# Patient Record
Sex: Female | Born: 1975 | Race: Black or African American | Hispanic: No | Marital: Single | Smoking: Never smoker
Health system: Southern US, Community
[De-identification: ages and names within clinical notes are randomized; demographics above are authoritative.]

## PROBLEM LIST (undated history)

## (undated) DIAGNOSIS — E669 Obesity, unspecified: Secondary | ICD-10-CM

## (undated) DIAGNOSIS — L980 Pyogenic granuloma: Secondary | ICD-10-CM

## (undated) DIAGNOSIS — G43909 Migraine, unspecified, not intractable, without status migrainosus: Secondary | ICD-10-CM

## (undated) DIAGNOSIS — E119 Type 2 diabetes mellitus without complications: Secondary | ICD-10-CM

## (undated) DIAGNOSIS — J45909 Unspecified asthma, uncomplicated: Secondary | ICD-10-CM

## (undated) DIAGNOSIS — J302 Other seasonal allergic rhinitis: Secondary | ICD-10-CM

## (undated) DIAGNOSIS — Z862 Personal history of diseases of the blood and blood-forming organs and certain disorders involving the immune mechanism: Secondary | ICD-10-CM

## (undated) DIAGNOSIS — I10 Essential (primary) hypertension: Secondary | ICD-10-CM

## (undated) HISTORY — PX: DILATION AND CURETTAGE OF UTERUS: SHX78

## (undated) HISTORY — DX: Essential (primary) hypertension: I10

## (undated) HISTORY — PX: CHOLECYSTECTOMY: SHX55

## (undated) HISTORY — DX: Type 2 diabetes mellitus without complications: E11.9

## (undated) HISTORY — PX: DILATION AND EVACUATION: SHX1459

## (undated) HISTORY — PX: CERVICAL CONE BIOPSY: SUR198

## (undated) HISTORY — DX: Migraine, unspecified, not intractable, without status migrainosus: G43.909

---

## 2001-08-21 ENCOUNTER — Emergency Department (HOSPITAL_COMMUNITY): Admission: EM | Admit: 2001-08-21 | Discharge: 2001-08-21 | Payer: Self-pay | Admitting: Emergency Medicine

## 2001-09-09 ENCOUNTER — Encounter: Payer: Self-pay | Admitting: Emergency Medicine

## 2001-09-09 ENCOUNTER — Emergency Department (HOSPITAL_COMMUNITY): Admission: EM | Admit: 2001-09-09 | Discharge: 2001-09-09 | Payer: Self-pay | Admitting: Emergency Medicine

## 2002-09-01 ENCOUNTER — Emergency Department (HOSPITAL_COMMUNITY): Admission: EM | Admit: 2002-09-01 | Discharge: 2002-09-01 | Payer: Self-pay | Admitting: Emergency Medicine

## 2002-09-04 ENCOUNTER — Emergency Department (HOSPITAL_COMMUNITY): Admission: EM | Admit: 2002-09-04 | Discharge: 2002-09-04 | Payer: Self-pay | Admitting: *Deleted

## 2004-12-24 ENCOUNTER — Emergency Department (HOSPITAL_COMMUNITY): Admission: EM | Admit: 2004-12-24 | Discharge: 2004-12-24 | Payer: Self-pay | Admitting: Emergency Medicine

## 2005-07-05 ENCOUNTER — Emergency Department (HOSPITAL_COMMUNITY): Admission: EM | Admit: 2005-07-05 | Discharge: 2005-07-05 | Payer: Self-pay | Admitting: Emergency Medicine

## 2005-07-16 ENCOUNTER — Emergency Department (HOSPITAL_COMMUNITY): Admission: EM | Admit: 2005-07-16 | Discharge: 2005-07-16 | Payer: Self-pay | Admitting: Emergency Medicine

## 2005-12-02 ENCOUNTER — Emergency Department (HOSPITAL_COMMUNITY): Admission: EM | Admit: 2005-12-02 | Discharge: 2005-12-02 | Payer: Self-pay | Admitting: *Deleted

## 2006-01-05 ENCOUNTER — Emergency Department (HOSPITAL_COMMUNITY): Admission: EM | Admit: 2006-01-05 | Discharge: 2006-01-05 | Payer: Self-pay | Admitting: Emergency Medicine

## 2007-04-16 ENCOUNTER — Emergency Department (HOSPITAL_COMMUNITY): Admission: EM | Admit: 2007-04-16 | Discharge: 2007-04-16 | Payer: Self-pay | Admitting: Emergency Medicine

## 2007-07-26 ENCOUNTER — Inpatient Hospital Stay (HOSPITAL_COMMUNITY): Admission: AD | Admit: 2007-07-26 | Discharge: 2007-07-26 | Payer: Self-pay | Admitting: Obstetrics and Gynecology

## 2009-02-22 ENCOUNTER — Emergency Department (HOSPITAL_BASED_OUTPATIENT_CLINIC_OR_DEPARTMENT_OTHER): Admission: EM | Admit: 2009-02-22 | Discharge: 2009-02-23 | Payer: Self-pay | Admitting: Emergency Medicine

## 2009-02-22 ENCOUNTER — Emergency Department (HOSPITAL_BASED_OUTPATIENT_CLINIC_OR_DEPARTMENT_OTHER): Admission: EM | Admit: 2009-02-22 | Discharge: 2009-02-22 | Payer: Self-pay | Admitting: Emergency Medicine

## 2009-02-22 ENCOUNTER — Ambulatory Visit: Payer: Self-pay | Admitting: Diagnostic Radiology

## 2009-04-30 ENCOUNTER — Emergency Department (HOSPITAL_BASED_OUTPATIENT_CLINIC_OR_DEPARTMENT_OTHER): Admission: EM | Admit: 2009-04-30 | Discharge: 2009-04-30 | Payer: Self-pay | Admitting: Emergency Medicine

## 2009-05-20 ENCOUNTER — Emergency Department (HOSPITAL_BASED_OUTPATIENT_CLINIC_OR_DEPARTMENT_OTHER): Admission: EM | Admit: 2009-05-20 | Discharge: 2009-05-20 | Payer: Self-pay | Admitting: Emergency Medicine

## 2009-05-20 ENCOUNTER — Ambulatory Visit: Payer: Self-pay | Admitting: Diagnostic Radiology

## 2010-05-24 ENCOUNTER — Emergency Department (HOSPITAL_COMMUNITY)
Admission: EM | Admit: 2010-05-24 | Discharge: 2010-05-24 | Payer: Self-pay | Source: Home / Self Care | Admitting: Emergency Medicine

## 2010-05-24 LAB — POCT I-STAT, CHEM 8
BUN: 7 mg/dL (ref 6–23)
Calcium, Ion: 1.04 mmol/L — ABNORMAL LOW (ref 1.12–1.32)
Chloride: 102 mEq/L (ref 96–112)
Creatinine, Ser: 0.8 mg/dL (ref 0.4–1.2)
Glucose, Bld: 108 mg/dL — ABNORMAL HIGH (ref 70–99)
HCT: 37 % (ref 36.0–46.0)
Hemoglobin: 12.6 g/dL (ref 12.0–15.0)
Potassium: 4.3 mEq/L (ref 3.5–5.1)
Sodium: 137 mEq/L (ref 135–145)
TCO2: 28 mmol/L (ref 0–100)

## 2010-05-24 LAB — BASIC METABOLIC PANEL
BUN: 6 mg/dL (ref 6–23)
CO2: 29 mEq/L (ref 19–32)
Calcium: 9.2 mg/dL (ref 8.4–10.5)
Chloride: 101 mEq/L (ref 96–112)
Creatinine, Ser: 0.78 mg/dL (ref 0.4–1.2)
GFR calc Af Amer: >60 mL/min (ref >60)
GFR calc non Af Amer: >60 mL/min (ref >60)
Glucose, Bld: 110 mg/dL — ABNORMAL HIGH (ref 70–99)
Potassium: 3.9 mEq/L (ref 3.5–5.1)
Sodium: 134 mEq/L — ABNORMAL LOW (ref 135–145)

## 2010-07-20 ENCOUNTER — Emergency Department (HOSPITAL_COMMUNITY)
Admission: EM | Admit: 2010-07-20 | Discharge: 2010-07-20 | Disposition: A | Discharge status: Home / Self Care | Payer: Self-pay | Attending: Emergency Medicine | Admitting: Emergency Medicine

## 2010-07-20 DIAGNOSIS — I10 Essential (primary) hypertension: Secondary | ICD-10-CM | POA: Insufficient documentation

## 2010-07-20 DIAGNOSIS — J309 Allergic rhinitis, unspecified: Secondary | ICD-10-CM | POA: Insufficient documentation

## 2010-07-20 DIAGNOSIS — J45909 Unspecified asthma, uncomplicated: Secondary | ICD-10-CM | POA: Insufficient documentation

## 2010-07-20 DIAGNOSIS — R0602 Shortness of breath: Secondary | ICD-10-CM | POA: Insufficient documentation

## 2010-07-27 LAB — RAPID STREP SCREEN (MED CTR MEBANE ONLY): Streptococcus, Group A Screen (Direct): NEGATIVE

## 2010-09-03 ENCOUNTER — Emergency Department (HOSPITAL_COMMUNITY)
Admission: EM | Admit: 2010-09-03 | Discharge: 2010-09-03 | Disposition: A | Discharge status: Home / Self Care | Payer: Self-pay | Attending: Emergency Medicine | Admitting: Emergency Medicine

## 2010-09-03 DIAGNOSIS — I10 Essential (primary) hypertension: Secondary | ICD-10-CM | POA: Insufficient documentation

## 2010-09-03 DIAGNOSIS — J329 Chronic sinusitis, unspecified: Secondary | ICD-10-CM | POA: Insufficient documentation

## 2010-09-03 DIAGNOSIS — R51 Headache: Secondary | ICD-10-CM | POA: Insufficient documentation

## 2010-10-19 ENCOUNTER — Inpatient Hospital Stay (HOSPITAL_COMMUNITY): Payer: Self-pay

## 2010-10-19 ENCOUNTER — Inpatient Hospital Stay (HOSPITAL_COMMUNITY)
Admission: AD | Admit: 2010-10-19 | Discharge: 2010-10-19 | Disposition: A | Discharge status: Home / Self Care | Payer: Self-pay | Source: Ambulatory Visit | Attending: Family Medicine | Admitting: Family Medicine

## 2010-10-19 DIAGNOSIS — A499 Bacterial infection, unspecified: Secondary | ICD-10-CM

## 2010-10-19 DIAGNOSIS — N938 Other specified abnormal uterine and vaginal bleeding: Secondary | ICD-10-CM

## 2010-10-19 DIAGNOSIS — N760 Acute vaginitis: Secondary | ICD-10-CM

## 2010-10-19 DIAGNOSIS — N949 Unspecified condition associated with female genital organs and menstrual cycle: Secondary | ICD-10-CM

## 2010-10-19 DIAGNOSIS — B9689 Other specified bacterial agents as the cause of diseases classified elsewhere: Secondary | ICD-10-CM | POA: Insufficient documentation

## 2010-10-19 LAB — WET PREP, GENITAL
WBC, Wet Prep HPF POC: NONE SEEN
Yeast Wet Prep HPF POC: NONE SEEN

## 2010-10-19 LAB — CBC
MCV: 84.7 fL (ref 78.0–100.0)
Platelets: 295 10*3/uL (ref 150–400)
RBC: 4.51 MIL/uL (ref 3.87–5.11)
RDW: 15.2 % (ref 11.5–15.5)
WBC: 7.9 10*3/uL (ref 4.0–10.5)

## 2010-10-27 ENCOUNTER — Other Ambulatory Visit: Payer: Self-pay | Admitting: Obstetrics and Gynecology

## 2010-10-27 ENCOUNTER — Ambulatory Visit (INDEPENDENT_AMBULATORY_CARE_PROVIDER_SITE_OTHER): Payer: Self-pay | Admitting: Obstetrics and Gynecology

## 2010-10-27 DIAGNOSIS — N939 Abnormal uterine and vaginal bleeding, unspecified: Secondary | ICD-10-CM

## 2010-10-27 DIAGNOSIS — N926 Irregular menstruation, unspecified: Secondary | ICD-10-CM

## 2010-10-28 NOTE — Assessment & Plan Note (Signed)
Mary Hanson, MARCOTTE NO.:  [id number]  MEDICAL RECORD NO.:  [id number]           PATIENT TYPE:  A  LOCATION:  [facility] at [facility]         FACILITY:  [facility]  PHYSICIAN:  Caren Griffins, CNM       DATE OF BIRTH:  February 09, 1976  DATE OF SERVICE:  10/27/2010                                 CLINIC NOTE  REASON FOR VISIT:  Followup for abnormal vaginal bleeding.  HISTORY:  This is a 35 year old AAF who was seen in maternity admissions on October 19, 2010, due to skipping the month of May when her cycle is due and began bleeding on October 04, 2010, and has continued ever since on October 19, 2010.  She had a hemoglobin of 12.1, negative UPT, negative GC and Chlamydia, and she was given Provera which she says is 5 mg a day to take one a day which she has been doing.  She has noticed no difference in the bleeding, has no orthostatic symptoms or very heavy bleeding. She is using 3-4 pads a day.  She states that her menses were at monthly intervals her whole life except for a period when she was 19 and had a therapeutic AB and had some irregular bleeding following that otherwise she has had menses every month with 7-day flow, moderate amount, minimal dysmenorrhea.  Also of note, her ultrasound on October 19, 2010, showed normal exam with no lesions to explain bleeding.  Her endometrial stripe was 11 mm.  There was no evidence of PCOS.  She denies hirsutism.  Chronic hypertension  this was diagnosed a  year ago.  She gets care at the [facility]  She states that she is in the process of getting her Norvasc 3 pill and has been out for a couple of days.  She says that her pressures are usually 124/80 when she takes her Norvasc. She also says that she was evaluated for thyroid disorder about a year ago, and she had a TSH that was normal and a thyroid ultrasound that was normal.  She does tell me that her twin sister has hypothyroidism.  She is also taking Flagyl as directed due to  having few clue cells on her wet prep and she was seen in [facility]U on October 19, 2010.  She denies any irritative vaginal discharge.  ALLERGIES:  None.  CURRENT MEDICATIONS:  Norvasc 5 mg today but she is out of it. Occasionally uses __________ or albuterol.  HEALTH CARE MAINTENANCE:  She has had the usual immunizations and gets dental care.  MENSTRUAL HISTORY:  9 x 28 x 7, four day moderate flow.  No intermenstrual spotting or bleeding.  She uses condoms for contraception.  She thinks her last Pap smear was about 3 years ago and about 16 years ago she did have cryo procedure to her cervix.  STD history all negative.  No history of endometriosis, fibroids, or ovarian cyst.  PAST MEDICAL HISTORY:  PMH is significant for asthma but she only uses albuterol occasionally and high blood pressure which she has known about for 1 year.  PAST SURGICAL HISTORY:  Surgeries.  She had cholecystectomy in 2005.  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  Nonsmoker and no drink, no illicit drug use.  No history of abuse.  She is in a mutually monogamous relationship for over a year.  FAMILY HISTORY:  Father diabetes.  Paternal grandparents heart disease. Mother, father, and twin sister all have high blood pressure.  No history of cancer of breast, colon, ovaries, or uterus.  No history of blood clots.  REVIEW OF SYSTEMS:  Positive for weakness and headaches at times.  PHYSICAL EXAMINATION:  VITAL SIGNS:  Temperature 99.5, pulse 97, BP 180/95 on recheck 172/101.  Weight 304 pounds, height 5 feet 6 inches. GENERAL:  Very pleasant in NAD. HEENT:  Normocephalic. NECK:  Thyroid ULNS. HEART:  RRR without murmur. LUNGS:  CTA bilateral. ABDOMEN:  Morbidly obese and nontender. PELVIC:  NEFG.  No vaginal or cervical lesions noted, have a moderate amount of blood obscuring the cervix.  Pap smear was done, unable to appreciate any tenderness or masses on bimanual exam due  to her morbid obesity.  IMPRESSION AND PLAN: 1. Morbid obesity.  She is aware that this is a  important health     problem that she needs to address with diet and exercise.  I have     encouraged her to do so.  Chronic hypertension, poorly controlled     and off her meds for 2 days.  She will be getting her prescription     in the next day and she is advised to go back to the [facility] for another blood pressure check after she has been on the     medication for a couple of weeks. 2. Abnormal bleeding.  I discussed the need for endometrial biopsy or     further workup at this time with Dr. Okey Dupre he advises to just     increase the amount of Provera that she is on so will increase her     to 10 mg b.i.d. for 10 days.  After the bleeding stops, she is told     to keep a menstrual calendar, and if her cycles normalize he does     not suggest proceeding with an endometrial biopsy. 3. Her upper limits normal size thyroid and hypothyroidism in twin     sister.  TSH is done today.  She will come back with her menstrual     calendar in 4-6 months.          ______________________________ Caren Griffins, CNM    DP/MEDQ  D:  10/27/2010  T:  10/28/2010  Job:  [id number]

## 2010-11-17 ENCOUNTER — Ambulatory Visit (INDEPENDENT_AMBULATORY_CARE_PROVIDER_SITE_OTHER): Payer: Self-pay | Admitting: Obstetrics & Gynecology

## 2010-11-17 ENCOUNTER — Encounter: Payer: Self-pay | Admitting: Obstetrics & Gynecology

## 2010-11-17 VITALS — BP 151/86 | HR 78 | Temp 97.4°F | Ht 66.0 in | Wt 300.0 lb

## 2010-11-17 DIAGNOSIS — N938 Other specified abnormal uterine and vaginal bleeding: Secondary | ICD-10-CM

## 2010-11-17 DIAGNOSIS — N949 Unspecified condition associated with female genital organs and menstrual cycle: Secondary | ICD-10-CM

## 2010-11-17 LAB — POCT PREGNANCY, URINE: Preg Test, Ur: NEGATIVE

## 2010-11-17 MED ORDER — MEDROXYPROGESTERONE ACETATE 10 MG PO TABS: 10.0000 mg | ORAL_TABLET | Freq: Every day | ORAL | Status: DC

## 2010-11-17 MED ORDER — AMLODIPINE BESYLATE 10 MG PO TABS: 10.0000 mg | ORAL_TABLET | Freq: Every day | ORAL | Status: DC

## 2010-11-17 NOTE — Progress Notes (Signed)
  Subjective:     Mary Hanson is an 35 y.o. woman who presents for irregular menses. She had been bleeding regularly. She is now bleeding every  day and menses are lasting several days. She changes her pad or tampon every several hours. Dysmenorrhea:mild, occurring throughout menses. Cyclic symptoms include: none. Current contraception: condoms. History of infertility: no. History of abnormal Pap smear: no. Finished provera for 10 days, bleeding almost stopped, now resumed.  Menstrual History:  Patient's last menstrual period was 10/04/2010.    The following portions of the patient's history were reviewed and updated as appropriate: allergies, current medications, past family history, past medical history, past social history, past surgical history and problem list.  Review of Systems Pertinent items are noted in HPI.    Objective:    BP 151/86  Pulse 78  Temp(Src) 97.4 F (36.3 C) (Oral)  Ht 5\' 6"  (1.676 m)  Wt 300 lb (136.079 kg)  BMI 48.42 kg/m2  LMP 10/04/2010    Assessment:    dysfunctional uterine bleeding    Plan:     Provera 10mg  daily for 6 weeks. RTC 3 mo. Subjective:     Mary Hanson is a 35 y.o. woman who presents for irregular menses. Patient's last menstrual period was 10/04/2010. Me. Periods are regular every 28-30 days until recently , lasting a few days. Dysmenorrhea:none. Cyclic symptoms include: none. Current contraception: condoms.History of infertility: no. History of abnormal Pap smear: no. She had episode of prolonged vaginal bleeding and was seen in MAU 6/27, then f/u in clinic 7/5. She took Provera 10mg  bid for 10 days. She almost stopped bleeding, the restarted, 2 pad a day and some cramps relieved  by Motrin.  The following portions of the patient's history were reviewed and updated as appropriate: allergies, current medications, past family history, past medical history, past social history, past surgical history and problem list.  Review of  Systems Pertinent items are noted in HPI.     Objective:    Normal affect, alert. Exam deferred    Assessment:    The patient has menometrorrhagia.    Plan:    Diagnosis explained in detail. All questions answered. We will continue Provera for 6 weeks, return in 3 mo. Keep menstrual calendar.

## 2010-11-17 NOTE — Progress Notes (Signed)
upt is negative

## 2010-11-29 ENCOUNTER — Inpatient Hospital Stay (HOSPITAL_COMMUNITY)
Admission: AD | Admit: 2010-11-29 | Discharge: 2010-11-29 | Disposition: A | Discharge status: Home / Self Care | Payer: Self-pay | Source: Ambulatory Visit | Attending: Obstetrics & Gynecology | Admitting: Obstetrics & Gynecology

## 2010-11-29 ENCOUNTER — Telehealth: Payer: Self-pay | Admitting: *Deleted

## 2010-11-29 ENCOUNTER — Encounter (HOSPITAL_COMMUNITY): Payer: Self-pay

## 2010-11-29 DIAGNOSIS — N938 Other specified abnormal uterine and vaginal bleeding: Secondary | ICD-10-CM | POA: Insufficient documentation

## 2010-11-29 DIAGNOSIS — N949 Unspecified condition associated with female genital organs and menstrual cycle: Secondary | ICD-10-CM | POA: Insufficient documentation

## 2010-11-29 LAB — CBC
HCT: 38.1 % (ref 36.0–46.0)
Hemoglobin: 12.1 g/dL (ref 12.0–15.0)
MCHC: 31.8 g/dL (ref 30.0–36.0)
MCV: 84.9 fL (ref 78.0–100.0)
WBC: 10.8 10*3/uL — ABNORMAL HIGH (ref 4.0–10.5)

## 2010-11-29 NOTE — Progress Notes (Signed)
Patient states that she has had irregular vaginal bleeding since June 12th. She states that she has been seen at the clinics and had a negative ultrasound and July 17th she was to start provera 10mg  for 42 days but she didn't start to take it because the bleeding stopped. She states that she woke up this morning with bright red bleeding that has clots. She denies feeling dizzy.

## 2010-11-29 NOTE — ED Provider Notes (Addendum)
History   Pt presents today c/o vag bleeding. She has been seen multiple times for this same problem. She was given a new Rx for provera but told to wait and see if her bleeding stopped on its on. She states the bleeding did stop for about 1wk but began again this morning. She denies severe pain, fever, or any other problems at this time. The last time she took provera was about 2.5 wks ago.  Chief Complaint  Patient presents with  . Vaginal Bleeding   HPI  OB History    Grav Para Term Preterm Abortions TAB SAB Ect Mult Living   1    1 1           Past Medical History  Diagnosis Date  . Hypertension   . Asthma   . Migraines 2011    Past Surgical History  Procedure Date  . Cholecystectomy 2006 or 2007  . Dilation and curettage of uterus     ABORTION    Family History  Problem Relation Age of Onset  . Diabetes type II Father   . Hypertension Father   . Hypertension Mother   . Goiter Sister 19    identical twin  . Asthma Sister   . Hypertension Sister     History  Substance Use Topics  . Smoking status: Never Smoker   . Smokeless tobacco: Not on file  . Alcohol Use: 0.0 oz/week    0 Glasses of wine per week    Allergies: No Known Allergies  Prescriptions prior to admission  Medication Sig Dispense Refill  . amLODipine (NORVASC) 10 MG tablet Take 1 tablet (10 mg total) by mouth daily.  30 tablet  1  . ibuprofen (ADVIL,MOTRIN) 200 MG tablet Take 200 mg by mouth every 6 (six) hours as needed. For cramps.       . medroxyPROGESTERone (PROVERA) 10 MG tablet Take 1 tablet (10 mg total) by mouth daily.  42 tablet  1  . Multiple Vitamins-Minerals (MULTIVITAMIN WITH MINERALS) tablet Take 1 tablet by mouth daily.        Marland Kitchen albuterol (PROVENTIL, VENTOLIN) (5 MG/ML) 0.5% NEBU Take by nebulization every 4 (four) hours as needed.          Review of Systems  Constitutional: Negative for fever.  Cardiovascular: Negative for chest pain.  Gastrointestinal: Negative for nausea,  vomiting, abdominal pain, diarrhea and constipation.  Genitourinary: Negative for dysuria, urgency, frequency and hematuria.  Neurological: Negative for dizziness and headaches.  Psychiatric/Behavioral: Negative for depression and suicidal ideas.   Physical Exam   Blood pressure 139/90, pulse 71, temperature 97.7 F (36.5 C), resp. rate 20, height 5\' 6"  (1.676 m), weight 298 lb 3.2 oz (135.263 kg), last menstrual period 11/15/2010, SpO2 100.00%.  Physical Exam  Constitutional: She is oriented to person, place, and time. She appears well-developed and well-nourished. No distress.  GI: Soft. She exhibits no distension. There is no tenderness. There is no rebound and no guarding.  Genitourinary: There is bleeding (minimal amount of vag bleeding noted on exam) around the vagina. No vaginal discharge found.       Uterus appears to be NL size and shape but exam is difficult secondary to increased body habitus. No adnexal masses noted. Pt non-tender on exam.  Neurological: She is alert and oriented to person, place, and time.  Skin: Skin is warm and dry. She is not diaphoretic.  Psychiatric: She has a normal mood and affect. Her behavior is normal. Judgment and thought  content normal.    MAU Course  Procedures  Results for orders placed during the hospital encounter of 11/29/10 (from the past 24 hour(s))  CBC     Status: Abnormal   Collection Time   11/29/10  1:30 PM      Component Value Range   WBC 10.8 (*) 4.0 - 10.5 (K/uL)   RBC 4.49  3.87 - 5.11 (MIL/uL)   Hemoglobin 12.1  12.0 - 15.0 (g/dL)   HCT 56.2  13.0 - 86.5 (%)   MCV 84.9  78.0 - 100.0 (fL)   MCH 26.9  26.0 - 34.0 (pg)   MCHC 31.8  30.0 - 36.0 (g/dL)   RDW 78.4  69.6 - 29.5 (%)   Platelets 318  150 - 400 (K/uL)  POCT PREGNANCY, URINE     Status: Normal   Collection Time   11/29/10  1:35 PM      Component Value Range   Preg Test, Ur NEGATIVE       Assessment and Plan  Irregular vag bleeding: at this time, I believe she  having withdrawal bleeding from the provera that she finished nearly 3wks ago. Hopefully this represents a normal menstrual cycle. She will wait and see if the bleeding stop in the next 5-6 days. If it does not stop, she will then restart her provera. She has a f/u appt scheduled in the GYN clinic. Discussed diet, activity, risks, and precautions.  Clinton Gallant. Rice III, DrHSc, MPAS, PA-C  11/29/2010, 2:04 PM   Henrietta Hoover, PA 11/29/10 1410

## 2010-11-29 NOTE — Telephone Encounter (Signed)
Patient called stating was seen about 2 weeks ago by Dr. Debroah Loop and was given Rx for Provera for 42 days. Bleeding stopped for 1 week but patient states on message that she woke up this am with bright red blood and wanted clinic to give her a call back in reference to bleeding. Telephoned patients work # [phone] and per [facility] patient is being seen by them now.

## 2010-11-29 NOTE — Progress Notes (Signed)
Pt states she has had irregular bleeding since 6-12. Had no history prior to that with irregular cycles. Has been seeing Dr. Debroah Loop at the [facility] and was put on Provera x 2 cycles and bleeding stopped. Started LMP on 7-24 (normal cycle), Started bleeding again today with heavy bright  red bleeding and more cramping than usual.

## 2010-12-07 ENCOUNTER — Ambulatory Visit: Payer: Self-pay | Admitting: Obstetrics and Gynecology

## 2010-12-29 ENCOUNTER — Encounter: Payer: Self-pay | Admitting: Family Medicine

## 2010-12-29 ENCOUNTER — Ambulatory Visit (INDEPENDENT_AMBULATORY_CARE_PROVIDER_SITE_OTHER): Payer: Self-pay | Admitting: Family Medicine

## 2010-12-29 DIAGNOSIS — J309 Allergic rhinitis, unspecified: Secondary | ICD-10-CM

## 2010-12-29 DIAGNOSIS — N920 Excessive and frequent menstruation with regular cycle: Secondary | ICD-10-CM

## 2010-12-29 DIAGNOSIS — G43909 Migraine, unspecified, not intractable, without status migrainosus: Secondary | ICD-10-CM

## 2010-12-29 DIAGNOSIS — J45909 Unspecified asthma, uncomplicated: Secondary | ICD-10-CM

## 2010-12-29 DIAGNOSIS — I10 Essential (primary) hypertension: Secondary | ICD-10-CM

## 2010-12-29 DIAGNOSIS — N949 Unspecified condition associated with female genital organs and menstrual cycle: Secondary | ICD-10-CM

## 2010-12-29 DIAGNOSIS — J45901 Unspecified asthma with (acute) exacerbation: Secondary | ICD-10-CM | POA: Insufficient documentation

## 2010-12-29 DIAGNOSIS — N938 Other specified abnormal uterine and vaginal bleeding: Secondary | ICD-10-CM

## 2010-12-29 MED ORDER — ALBUTEROL SULFATE HFA 108 (90 BASE) MCG/ACT IN AERS: 2.0000 | INHALATION_SPRAY | RESPIRATORY_TRACT | Status: DC | PRN

## 2010-12-29 MED ORDER — SUMATRIPTAN SUCCINATE 50 MG PO TABS: 50.0000 mg | ORAL_TABLET | Freq: Once | ORAL | Status: DC | PRN

## 2010-12-29 MED ORDER — LISINOPRIL-HYDROCHLOROTHIAZIDE 10-12.5 MG PO TABS: 1.0000 | ORAL_TABLET | Freq: Every day | ORAL | Status: DC

## 2010-12-29 NOTE — Assessment & Plan Note (Signed)
Refill albuterol to have in case of emergency.

## 2010-12-29 NOTE — Progress Notes (Signed)
  Subjective:    Patient ID: Mary Hanson, female    DOB: 15-Jul-1975, 35 y.o.   MRN: [id number]  HPI Here today for new pt. Visit.  Interested in changing BP meds.  Has had some headaches related to BP.  Reports that she  has maxxed out her Norvasc.  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 She is having issues with abnl bleeding and is doing well on Provera.  She has not started her family as yet and is interested in preserving fertility.    Review of Systems  Constitutional: Positive for appetite change (decreased). Negative for activity change.  HENT: Negative for hearing loss and rhinorrhea.   Respiratory: Negative for apnea, chest tightness and shortness of breath.   Cardiovascular: Negative for chest pain and leg swelling.  Gastrointestinal: Negative for abdominal pain, diarrhea, constipation and abdominal distention.  Genitourinary: Positive for vaginal bleeding and menstrual problem. Negative for frequency, vaginal discharge, difficulty urinating, vaginal pain and pelvic pain.  Musculoskeletal: Negative for arthralgias.  Neurological: Positive for headaches. Negative for dizziness and weakness.  Psychiatric/Behavioral: Negative for behavioral problems and agitation.       Objective:   Physical Exam  Vitals reviewed. Constitutional: She is oriented to person, place, and time. She appears well-developed and well-nourished.  HENT:  Head: Normocephalic and atraumatic.  Eyes: No scleral icterus.  Neck: Normal range of motion. Neck supple. No thyromegaly present.  Cardiovascular: Normal rate and regular rhythm.   Pulmonary/Chest: Effort normal and breath sounds normal.  Abdominal: Soft. Bowel sounds are normal.  Neurological: She is alert and oriented to person, place, and time.  Skin: Skin is warm and dry.  Psychiatric: She has a normal mood and affect.          Assessment & Plan:  Hypertension--change to Zestoretic given BP is still  uncontrolled and CCB is at max dose. F/u 1 month, check BP periodically. Continue provera for now and watch and see what her cycles do, prev. nml and nml u/s and tsh, pap at [facility].  Consider EMB.

## 2010-12-29 NOTE — Assessment & Plan Note (Signed)
Previously on Relpax, (too $$), change to Imitrex for trial.  Failed Maxalt previously.

## 2010-12-29 NOTE — Patient Instructions (Signed)

## 2010-12-29 NOTE — Assessment & Plan Note (Signed)
Change from CCB to combo, diuretic and ACE-I, with room to increase dosage if needed. F/u in 1 mo. For BP check. Periodically check BP until then and let us know if BP is still up.

## 2011-01-30 ENCOUNTER — Ambulatory Visit: Payer: Self-pay | Admitting: Family Medicine

## 2011-01-30 ENCOUNTER — Encounter: Payer: Self-pay | Admitting: Family Medicine

## 2011-01-30 ENCOUNTER — Ambulatory Visit (INDEPENDENT_AMBULATORY_CARE_PROVIDER_SITE_OTHER): Payer: Self-pay | Admitting: Family Medicine

## 2011-01-30 VITALS — BP 115/74 | HR 87 | Temp 98.2°F | Wt 294.0 lb

## 2011-01-30 DIAGNOSIS — I10 Essential (primary) hypertension: Secondary | ICD-10-CM

## 2011-01-30 DIAGNOSIS — R358 Other polyuria: Secondary | ICD-10-CM

## 2011-01-30 DIAGNOSIS — R3589 Other polyuria: Secondary | ICD-10-CM

## 2011-01-30 LAB — POCT URINALYSIS DIPSTICK
Bilirubin, UA: NEGATIVE
Ketones, UA: NEGATIVE
Spec Grav, UA: 1.02

## 2011-01-30 LAB — POCT UA - MICROSCOPIC ONLY

## 2011-01-30 NOTE — Progress Notes (Signed)
  Subjective:    Patient ID: Mary Hanson, female    DOB: 1975/07/21, 35 y.o.   MRN: [id number]  HPI Comments: No menses since last visit.  Has stopped Provera.  Awaiting cycle.  Hypertension This is a recurrent problem. The current episode started more than 1 month ago. The problem has been rapidly improving since onset. The problem is controlled. Pertinent negatives include no anxiety, chest pain, headaches, malaise/fatigue or shortness of breath. There are no associated agents to hypertension. Risk factors for coronary artery disease include obesity. Past treatments include ACE inhibitors and diuretics. The current treatment provides moderate improvement. There are no compliance problems.       Review of Systems  Constitutional: Negative for malaise/fatigue and activity change.  HENT: Negative for ear pain, congestion and rhinorrhea.   Respiratory: Negative for cough and shortness of breath.   Cardiovascular: Negative for chest pain.  Gastrointestinal: Negative for abdominal pain and abdominal distention.  Genitourinary: Negative for menstrual problem and pelvic pain.  Neurological: Negative for headaches.       Objective:   Physical Exam  Vitals reviewed. Constitutional: She appears well-developed and well-nourished. No distress.  HENT:  Head: Normocephalic.  Eyes: Pupils are equal, round, and reactive to light.  Neck: Normal range of motion.  Cardiovascular: Normal rate.   Pulmonary/Chest: Effort normal.  Abdominal: Soft.          Assessment & Plan:  HTN-greatly improved.  BP's at home 121-133/63-91, headaches are gone and feels better. Await cycle.

## 2011-01-30 NOTE — Assessment & Plan Note (Signed)
Much improved control on Zestoretic-continue.

## 2011-02-01 ENCOUNTER — Emergency Department (HOSPITAL_COMMUNITY)
Admission: EM | Admit: 2011-02-01 | Discharge: 2011-02-01 | Disposition: A | Discharge status: Home / Self Care | Payer: Self-pay | Attending: Emergency Medicine | Admitting: Emergency Medicine

## 2011-02-01 DIAGNOSIS — N898 Other specified noninflammatory disorders of vagina: Secondary | ICD-10-CM | POA: Insufficient documentation

## 2011-02-01 DIAGNOSIS — I10 Essential (primary) hypertension: Secondary | ICD-10-CM | POA: Insufficient documentation

## 2011-02-01 DIAGNOSIS — N949 Unspecified condition associated with female genital organs and menstrual cycle: Secondary | ICD-10-CM | POA: Insufficient documentation

## 2011-02-01 DIAGNOSIS — B379 Candidiasis, unspecified: Secondary | ICD-10-CM | POA: Insufficient documentation

## 2011-02-01 LAB — URINALYSIS, ROUTINE W REFLEX MICROSCOPIC
Glucose, UA: NEGATIVE mg/dL
Hgb urine dipstick: NEGATIVE
Specific Gravity, Urine: 1.013 (ref 1.005–1.030)
pH: 6 (ref 5.0–8.0)

## 2011-02-01 LAB — URINE MICROSCOPIC-ADD ON

## 2011-02-01 LAB — POCT PREGNANCY, URINE: Preg Test, Ur: NEGATIVE

## 2011-02-02 LAB — URINE CULTURE

## 2011-02-14 ENCOUNTER — Inpatient Hospital Stay (HOSPITAL_COMMUNITY)
Admission: AD | Admit: 2011-02-14 | Discharge: 2011-02-14 | Disposition: A | Discharge status: Home / Self Care | Payer: Self-pay | Source: Ambulatory Visit | Attending: Obstetrics & Gynecology | Admitting: Obstetrics & Gynecology

## 2011-02-14 DIAGNOSIS — B3731 Acute candidiasis of vulva and vagina: Secondary | ICD-10-CM | POA: Insufficient documentation

## 2011-02-14 DIAGNOSIS — B373 Candidiasis of vulva and vagina: Secondary | ICD-10-CM | POA: Insufficient documentation

## 2011-02-14 DIAGNOSIS — N760 Acute vaginitis: Secondary | ICD-10-CM

## 2011-02-14 LAB — URINALYSIS, ROUTINE W REFLEX MICROSCOPIC
Glucose, UA: NEGATIVE mg/dL
Leukocytes, UA: NEGATIVE
Nitrite: NEGATIVE
Protein, ur: NEGATIVE mg/dL

## 2011-02-14 NOTE — ED Provider Notes (Signed)
Mary McLaurin35 y.o.G1P0010 for recheck s/p yeast tx Chief Complaint  Patient presents with  . Follow-up    SUBJECTIVE  HPI: Here for recheck urinalysis. Seen at [facility] 02/01/11 for vaginal burning sensation. WP, GC/CT were negative but she had yeast on urinalysis. Tx was Diflucan.  Was advised to come here for follow up urine, but waited until after menses to come. States no further sx of vaginal irritatiin or itch and no dysuria, frequency, urgency.   Past Medical History  Diagnosis Date  . Hypertension   . Asthma   . Migraines 2011  . Allergy    Ob Hx: Gyn Hx: Past Surgical History  Procedure Date  . Cholecystectomy 2006 or 2007  . Dilation and curettage of uterus     ABORTION   History   Social History  . Marital Status: Single    Spouse Name: N/A    Number of Children: N/A  . Years of Education: N/A   Occupational History  . Not on file.   Social History Main Topics  . Smoking status: Never Smoker   . Smokeless tobacco: Not on file  . Alcohol Use: 0.0 oz/week    0 Glasses of wine per week  . Drug Use: No  . Sexually Active: Yes    Birth Control/ Protection: Condom   Other Topics Concern  . Not on file   Social History Narrative  . No narrative on file   No current facility-administered medications on file prior to encounter.   Current Outpatient Prescriptions on File Prior to Encounter  Medication Sig Dispense Refill  . albuterol (PROVENTIL HFA;VENTOLIN HFA) 108 (90 BASE) MCG/ACT inhaler Inhale 2 puffs into the lungs every 4 (four) hours as needed for wheezing.  1 Inhaler  3  . ibuprofen (ADVIL,MOTRIN) 200 MG tablet Take 200 mg by mouth every 6 (six) hours as needed. For cramps.       Marland Kitchen lisinopril-hydrochlorothiazide (ZESTORETIC) 10-12.5 MG per tablet Take 1 tablet by mouth daily.  30 tablet  11  . medroxyPROGESTERone (PROVERA) 10 MG tablet Take 1 tablet (10 mg total) by mouth daily.  42 tablet  1  . Multiple Vitamins-Minerals (MULTIVITAMIN WITH MINERALS)  tablet Take 1 tablet by mouth daily.        . SUMAtriptan (IMITREX) 50 MG tablet Take 1 tablet (50 mg total) by mouth once as needed for migraine.  9 tablet  2   No Known Allergies  ROS: Pertinent items in HPI  OBJECTIVE  BP 121/90  Pulse 80  Temp(Src) 98.4 F (36.9 C) (Oral)  Resp 20  LMP 02/05/2011   Physical Exam:  General: WN/WD in NAD Abd: obese NT Pelvic: deferred Back: neg CVAT Results for orders placed during the hospital encounter of 02/14/11 (from the past 24 hour(s))  URINALYSIS, ROUTINE W REFLEX MICROSCOPIC     Status: Normal   Collection Time   02/14/11  7:40 AM      Component Value Range   Color, Urine YELLOW  YELLOW    Appearance CLEAR  CLEAR    Specific Gravity, Urine 1.020  1.005 - 1.030    pH 5.5  5.0 - 8.0    Glucose, UA NEGATIVE  NEGATIVE (mg/dL)   Hgb urine dipstick NEGATIVE  NEGATIVE    Bilirubin Urine NEGATIVE  NEGATIVE    Ketones, ur NEGATIVE  NEGATIVE (mg/dL)   Protein, ur NEGATIVE  NEGATIVE (mg/dL)   Urobilinogen, UA 0.2  0.0 - 1.0 (mg/dL)   Nitrite NEGATIVE  NEGATIVE  Leukocytes, UA NEGATIVE  NEGATIVE     ASSESSMENT  Resolved candida vaginitis   PLAN F/U as needed with Dr. Shawnie Pons, her PCP at [facility]. Counseled on preventive measures.

## 2011-02-14 NOTE — ED Provider Notes (Signed)
Attestation of Attending Supervision of Advanced Practitioner: Evaluation and management procedures were performed by the PA/NP/CNM/OB Fellow under my supervision/collaboration. Chart reviewed and agree with management and plan.  Johnthan Axtman A M.D. 02/14/2011 11:17 AM   

## 2011-02-14 NOTE — Progress Notes (Signed)
Pt states she was seen at [facility] on 10-10. Was told she had yeast in her urine and was told to come for a recheck of her urine. Pt states she has had nausea, no vomiting, for 3-4 days. States she has not felt well.

## 2011-04-05 ENCOUNTER — Ambulatory Visit (INDEPENDENT_AMBULATORY_CARE_PROVIDER_SITE_OTHER): Payer: Self-pay | Admitting: Family Medicine

## 2011-04-05 ENCOUNTER — Encounter: Payer: Self-pay | Admitting: Family Medicine

## 2011-04-05 DIAGNOSIS — J45909 Unspecified asthma, uncomplicated: Secondary | ICD-10-CM

## 2011-04-05 MED ORDER — PREDNISONE (PAK) 10 MG PO TABS: ORAL_TABLET | ORAL | Status: DC

## 2011-04-05 NOTE — Patient Instructions (Addendum)
Asthma, Acute Bronchospasm Your exam shows you have asthma, or acute bronchospasm that acts like asthma. Bronchospasm means your air passages become narrowed. These conditions are due to inflammation and airway spasm that cause narrowing of the bronchial tubes in the lungs. This causes you to have wheezing and shortness of breath. CAUSES  Respiratory infections and allergies most often bring on these attacks. Smoking, air pollution, cold air, emotional upsets, and vigorous exercise can also bring them on.  TREATMENT   Treatment is aimed at making the narrowed airways larger. Mild asthma/bronchospasm is usually controlled with inhaled medicines. Albuterol is a common medicine that you breathe in to open spastic or narrowed airways. Some trade names for albuterol are Ventolin or Proventil. Steroid medicine is also used to reduce the inflammation when an attack is moderate or severe. Antibiotics (medications used to kill germs) are only used if a bacterial infection is present.   If you are pregnant and need to use Albuterol (Ventolin or Proventil), you can expect the baby to move more than usual shortly after the medicine is used.  HOME CARE INSTRUCTIONS   Rest.   Drink plenty of liquids. This helps the mucus to remain thin and easily coughed up. Do not use caffeine or alcohol.   Do not smoke. Avoid being exposed to second-hand smoke.   You play a critical role in keeping yourself in good health. Avoid exposure to things that cause you to wheeze. Avoid exposure to things that cause you to have breathing problems. Keep your medications up-to-date and available. Carefully follow your doctor's treatment plan.   When pollen or pollution is bad, keep windows closed and use an air conditioner go to places with air conditioning. If you are allergic to furry pets or birds, find new homes for them or keep them outside.   Take your medicine exactly as prescribed.   Asthma requires careful medical  attention. See your caregiver for follow-up as advised. If you are more than [redacted] weeks pregnant and you were prescribed any new medications, let your Obstetrician know about the visit and how you are doing. Arrange a recheck.  SEEK IMMEDIATE MEDICAL CARE IF:   You are getting worse.   You have trouble breathing. If severe, call 911.   You develop chest pain or discomfort.   You are throwing up or not drinking fluids.   You are not getting better within 24 hours.   You are coughing up yellow, green, brown, or bloody sputum.   You develop a fever over 102 F (38.9 C).   You have trouble swallowing.  MAKE SURE YOU:   Understand these instructions.   Will watch your condition.   Will get help right away if you are not doing well or get worse.  Document Released: 07/26/2006 Document Revised: 12/21/2010 Document Reviewed: 03/25/2007 [facility] Patient Information 2012 [place], [place].  Levonorgestrel intrauterine device (IUD) What is this medicine? LEVONORGESTREL IUD (LEE voe nor jes trel) is a contraceptive (birth control) device. It is used to prevent pregnancy and to treat heavy bleeding that occurs during your period. It can be used for up to 5 years. This medicine may be used for other purposes; ask your health care provider or pharmacist if you have questions. What should I tell my health care provider before I take this medicine? They need to know if you have any of these conditions: -abnormal Pap smear -cancer of the breast, uterus, or cervix -diabetes -endometritis -genital or pelvic infection now or in the  past -have more than one sexual partner or your partner has more than one partner -heart disease -history of an ectopic or tubal pregnancy -immune system problems -IUD in place -liver disease or tumor -problems with blood clots or take blood-thinners -use intravenous drugs -uterus of unusual shape -vaginal bleeding that has not been explained -an unusual or  allergic reaction to levonorgestrel, other hormones, silicone, or polyethylene, medicines, foods, dyes, or preservatives -pregnant or trying to get pregnant -breast-feeding How should I use this medicine? This device is placed inside the uterus by a health care professional. Talk to your pediatrician regarding the use of this medicine in children. Special care may be needed. Overdosage: If you think you have taken too much of this medicine contact a poison control center or emergency room at once. NOTE: This medicine is only for you. Do not share this medicine with others. What if I miss a dose? This does not apply. What may interact with this medicine? Do not take this medicine with any of the following medications: -amprenavir -bosentan -fosamprenavir This medicine may also interact with the following medications: -aprepitant -barbiturate medicines for inducing sleep or treating seizures -bexarotene -griseofulvin -medicines to treat seizures like carbamazepine, ethotoin, felbamate, oxcarbazepine, phenytoin, topiramate -modafinil -pioglitazone -rifabutin -rifampin -rifapentine -some medicines to treat HIV infection like atazanavir, indinavir, lopinavir, nelfinavir, tipranavir, ritonavir -St. John's wort -warfarin This list may not describe all possible interactions. Give your health care provider a list of all the medicines, herbs, non-prescription drugs, or dietary supplements you use. Also tell them if you smoke, drink alcohol, or use illegal drugs. Some items may interact with your medicine. What should I watch for while using this medicine? Visit your doctor or health care professional for regular check ups. See your doctor if you or your partner has sexual contact with others, becomes HIV positive, or gets a sexual transmitted disease. This product does not protect you against HIV infection (AIDS) or other sexually transmitted diseases. You can check the placement of the IUD  yourself by reaching up to the top of your vagina with clean fingers to feel the threads. Do not pull on the threads. It is a good habit to check placement after each menstrual period. Call your doctor right away if you feel more of the IUD than just the threads or if you cannot feel the threads at all. The IUD may come out by itself. You may become pregnant if the device comes out. If you notice that the IUD has come out use a backup birth control method like condoms and call your health care provider. Using tampons will not change the position of the IUD and are okay to use during your period. What side effects may I notice from receiving this medicine? Side effects that you should report to your doctor or health care professional as soon as possible: -allergic reactions like skin rash, itching or hives, swelling of the face, lips, or tongue -fever, flu-like symptoms -genital sores -high blood pressure -no menstrual period for 6 weeks during use -pain, swelling, warmth in the leg -pelvic pain or tenderness -severe or sudden headache -signs of pregnancy -stomach cramping -sudden shortness of breath -trouble with balance, talking, or walking -unusual vaginal bleeding, discharge -yellowing of the eyes or skin Side effects that usually do not require medical attention (report to your doctor or health care professional if they continue or are bothersome): -acne -breast pain -change in sex drive or performance -changes in weight -cramping, dizziness, or faintness while  the device is being inserted -headache -irregular menstrual bleeding within first 3 to 6 months of use -nausea This list may not describe all possible side effects. Call your doctor for medical advice about side effects. You may report side effects to FDA at 1-800-FDA-1088. Where should I keep my medicine? This does not apply. NOTE: This sheet is a summary. It may not cover all possible information. If you have questions  about this medicine, talk to your doctor, pharmacist, or health care provider.  2012, [employer] Standard. (05/01/2008 6:39:08 PM)

## 2011-04-05 NOTE — Assessment & Plan Note (Signed)
Having flare--needs meds.  Will give prednisone taper.

## 2011-04-05 NOTE — Progress Notes (Signed)
  Subjective:    Patient ID: Mary Hanson, female    DOB: December 25, 1975, 35 y.o.   MRN: [id number]  Asthma She complains of chest tightness, cough, difficulty breathing, shortness of breath and wheezing. This is a new problem. The current episode started today. The problem occurs constantly. The problem has been gradually worsening. The cough is non-productive. Associated symptoms include ear pain, rhinorrhea and a sore throat. Her symptoms are aggravated by change in weather. Her past medical history is significant for asthma.   Also wants to discuss BC.  Did not do well on OC's, nausea, not hypertensive at the time.  No Provera x 3 mos, with regular cycles.   Review of Systems  HENT: Positive for ear pain, congestion, sore throat and rhinorrhea.   Respiratory: Positive for cough, chest tightness, shortness of breath and wheezing.   Gastrointestinal: Negative for nausea, vomiting, diarrhea and constipation.  Genitourinary: Negative for menstrual problem and pelvic pain.  Musculoskeletal: Negative for back pain.       Objective:   Physical Exam  Vitals reviewed. Constitutional: She appears well-developed and well-nourished.  HENT:  Head: Normocephalic.  Neck: Normal range of motion.  Cardiovascular: Normal rate and regular rhythm.   Pulmonary/Chest: Effort normal. She has wheezes.  Abdominal: Soft.          Assessment & Plan:  Acute asthma exacerbation-prednisone x 5 days  Birth control consult--info on Mirena given

## 2011-04-06 ENCOUNTER — Encounter (HOSPITAL_BASED_OUTPATIENT_CLINIC_OR_DEPARTMENT_OTHER): Payer: Self-pay | Admitting: *Deleted

## 2011-04-06 ENCOUNTER — Emergency Department (HOSPITAL_BASED_OUTPATIENT_CLINIC_OR_DEPARTMENT_OTHER)
Admission: EM | Admit: 2011-04-06 | Discharge: 2011-04-07 | Disposition: A | Discharge status: Home / Self Care | Payer: Self-pay | Attending: Emergency Medicine | Admitting: Emergency Medicine

## 2011-04-06 DIAGNOSIS — R059 Cough, unspecified: Secondary | ICD-10-CM | POA: Insufficient documentation

## 2011-04-06 DIAGNOSIS — J45909 Unspecified asthma, uncomplicated: Secondary | ICD-10-CM | POA: Insufficient documentation

## 2011-04-06 DIAGNOSIS — R05 Cough: Secondary | ICD-10-CM

## 2011-04-06 NOTE — ED Notes (Signed)
Pt was seen by [facility] for asthma exacerbation and started on prednisone. Pt states that meds arent working and she still SOB and has constant cough.

## 2011-04-07 ENCOUNTER — Encounter (HOSPITAL_BASED_OUTPATIENT_CLINIC_OR_DEPARTMENT_OTHER): Payer: Self-pay | Admitting: Emergency Medicine

## 2011-04-07 MED ORDER — ALBUTEROL SULFATE HFA 108 (90 BASE) MCG/ACT IN AERS
2.0000 | INHALATION_SPRAY | RESPIRATORY_TRACT | Status: DC | PRN
  Administered 2011-04-07: 2 via RESPIRATORY_TRACT
  Filled 2011-04-07: qty 6.7

## 2011-04-07 MED ORDER — PROMETHAZINE-CODEINE 6.25-10 MG/5ML PO SYRP: 5.0000 mL | ORAL_SOLUTION | ORAL | Status: AC | PRN

## 2011-04-07 NOTE — ED Provider Notes (Signed)
History     CSN: [id number] Arrival date & time: 04/06/2011 11:48 PM   First MD Initiated Contact with Patient 04/07/11 [phone]      Chief Complaint  Patient presents with  . Asthma    (Consider location/radiation/quality/duration/timing/severity/associated sxs/prior treatment) HPI This is a 35 year old black female with several days history of nasal congestion cough and wheezing. She was seen yesterday by her primary care physician and started on prednisone. She has an albuterol inhaler that is nearly empty; she has a prescription for known but states she can't afford to buy it at this time. She is here now due to persistent cough in spite of her current medications. She states she's been on lisinopril for over a month and had no cough as a result of starting that. She also complains of chest soreness in the parasternal regions. She denies fever.  Past Medical History  Diagnosis Date  . Hypertension   . Asthma   . Migraines 2011  . Allergy     Past Surgical History  Procedure Date  . Cholecystectomy 2006 or 2007  . Dilation and curettage of uterus     ABORTION    Family History  Problem Relation Age of Onset  . Diabetes type II Father   . Hypertension Father   . Hypertension Mother   . Goiter Sister 47    identical twin  . Asthma Sister   . Hypertension Sister   . Stroke Maternal Grandfather   . Diabetes Paternal Grandfather   . Heart disease Paternal Grandfather     History  Substance Use Topics  . Smoking status: Never Smoker   . Smokeless tobacco: Not on file  . Alcohol Use: 0.0 oz/week    0 Glasses of wine per week    OB History    Grav Para Term Preterm Abortions TAB SAB Ect Mult Living   1    1 1           Review of Systems  All other systems reviewed and are negative.    Allergies  Review of patient's allergies indicates no known allergies.  Home Medications   Current Outpatient Rx  Name Route Sig Dispense Refill  . ALBUTEROL SULFATE HFA  108 (90 BASE) MCG/ACT IN AERS Inhalation Inhale 2 puffs into the lungs every 4 (four) hours as needed for wheezing. 1 Inhaler 3  . IBUPROFEN 200 MG PO TABS Oral Take 200 mg by mouth every 6 (six) hours as needed. For cramps.     Marland Kitchen LISINOPRIL-HYDROCHLOROTHIAZIDE 10-12.5 MG PO TABS Oral Take 1 tablet by mouth daily. 30 tablet 11  . MULTI-VITAMIN/MINERALS PO TABS Oral Take 1 tablet by mouth daily.      Marland Kitchen PREDNISONE (PAK) 10 MG PO TABS  40 mg po x 5 days 20 tablet 0  . SUMATRIPTAN SUCCINATE 50 MG PO TABS Oral Take 1 tablet (50 mg total) by mouth once as needed for migraine. 9 tablet 2   3 BP 124/51  Pulse 69  Temp(Src) 97.9 F (36.6 C) (Oral)  Resp 20  Ht 5\' 7"  (1.702 m)  Wt 298 lb (135.172 kg)  BMI 46.67 kg/m2  SpO2 97%  LMP 03/29/2011  Physical Exam General: Well-developed, well-nourished female in no acute distress; appearance consistent with age of record HENT: normocephalic, atraumatic Eyes: pupils equal round and reactive to light; extraocular muscles intact Neck: supple Heart: regular rate and rhythm Lungs: clear to auscultation bilaterally; dry cough Abdomen: soft; nondistended Extremities: No deformity; full range of motion  Neurologic: Awake, alert and oriented; motor function intact in all extremities and symmetric; no facial droop Skin: Warm and dry Psychiatric: Normal mood and affect    ED Course  Procedures (including critical care time)    MDM  We will refill her inhaler and prescribed Phenergan with codeine.         Hanley Seamen, MD 04/07/11 [phone]

## 2011-04-07 NOTE — Patient Instructions (Signed)
Instructed pt on the proper use of using albuteral mdi via aerochamber. Pt tolerated well.

## 2011-05-18 ENCOUNTER — Encounter: Payer: Self-pay | Admitting: Family Medicine

## 2011-05-18 ENCOUNTER — Ambulatory Visit (INDEPENDENT_AMBULATORY_CARE_PROVIDER_SITE_OTHER): Payer: Self-pay | Admitting: Family Medicine

## 2011-05-18 VITALS — BP 145/78 | HR 81 | Temp 99.2°F | Ht 67.0 in

## 2011-05-18 DIAGNOSIS — R300 Dysuria: Secondary | ICD-10-CM

## 2011-05-18 DIAGNOSIS — N760 Acute vaginitis: Secondary | ICD-10-CM | POA: Insufficient documentation

## 2011-05-18 LAB — POCT URINALYSIS DIPSTICK
Bilirubin, UA: NEGATIVE
Blood, UA: NEGATIVE
Nitrite, UA: NEGATIVE
Protein, UA: NEGATIVE
Urobilinogen, UA: 0.2
pH, UA: 5.5

## 2011-05-18 LAB — POCT WET PREP (WET MOUNT): Trichomonas Wet Prep HPF POC: NEGATIVE

## 2011-05-18 LAB — POCT UA - MICROSCOPIC ONLY

## 2011-05-18 MED ORDER — METRONIDAZOLE 500 MG PO TABS: 500.0000 mg | ORAL_TABLET | Freq: Two times a day (BID) | ORAL | Status: AC

## 2011-05-18 MED ORDER — FLUCONAZOLE 150 MG PO TABS: 150.0000 mg | ORAL_TABLET | Freq: Once | ORAL | Status: AC

## 2011-05-18 NOTE — Patient Instructions (Signed)
No sign of infection in your urine. Try to wear cotton underwear, avoid lotions/creams/douching. You have a bacterial vaginosis, overgrowth of bacteria. Also a yeast infection. Medicine rx sent to pharmacy. Come back if symptoms persist next week.    Candida Infection, Adult A candida infection (also called yeast, fungus and Monilia infection) is an overgrowth of yeast that can occur anywhere on the body. A yeast infection commonly occurs in warm, moist body areas. Usually, the infection remains localized but can spread to become a systemic infection. A yeast infection may be a sign of a more severe disease such as diabetes, leukemia, or AIDS. A yeast infection can occur in both men and women. In women, Candida vaginitis is a vaginal infection. It is one of the most common causes of vaginitis. Men usually do not have symptoms or know they have an infection until other problems develop. Men may find out they have a yeast infection because their sex partner has a yeast infection. Uncircumcised men are more likely to get a yeast infection than circumcised men. This is because the uncircumcised glans is not exposed to air and does not remain as dry as that of a circumcised glans. Older adults may develop yeast infections around dentures. CAUSES  Women  Antibiotics.   Steroid medication taken for a long time.   Being overweight (obese).   Diabetes.   Poor immune condition.   Certain serious medical conditions.   Immune suppressive medications for organ transplant patients.   Chemotherapy.   Pregnancy.   Menstration.   Stress and fatigue.   Intravenous drug use.   Oral contraceptives.   Wearing tight-fitting clothes in the crotch area.   Catching it from a sex partner who has a yeast infection.   Spermicide.   Intravenous, urinary, or other catheters.  Men  Catching it from a sex partner who has a yeast infection.   Having oral or anal sex with a person who has the  infection.   Spermicide.   Diabetes.   Antibiotics.   Poor immune system.   Medications that suppress the immune system.   Intravenous drug use.   Intravenous, urinary, or other catheters.  SYMPTOMS  Women  Thick, white vaginal discharge.   Vaginal itching.   Redness and swelling in and around the vagina.   Irritation of the lips of the vagina and perineum.   Blisters on the vaginal lips and perineum.   Painful sexual intercourse.   Low blood sugar (hypoglycemia).   Painful urination.   Bladder infections.   Intestinal problems such as constipation, indigestion, bad breath, bloating, increase in gas, diarrhea, or loose stools.  Men  Men may develop intestinal problems such as constipation, indigestion, bad breath, bloating, increase in gas, diarrhea, or loose stools.   Dry, cracked skin on the penis with itching or discomfort.   Jock itch.   Dry, flaky skin.   Athlete's foot.   Hypoglycemia.  DIAGNOSIS  Women  A history and an exam are performed.   The discharge may be examined under a microscope.   A culture may be taken of the discharge.  Men  A history and an exam are performed.   Any discharge from the penis or areas of cracked skin will be looked at under the microscope and cultured.   Stool samples may be cultured.  TREATMENT  Women  Vaginal antifungal suppositories and creams.   Medicated creams to decrease irritation and itching on the outside of the vagina.   Warm  compresses to the perineal area to decrease swelling and discomfort.   Oral antifungal medications.   Medicated vaginal suppositories or cream for repeated or recurrent infections.   Wash and dry the irritation areas before applying the cream.   Eating yogurt with lactobacillus may help with prevention and treatment.   Sometimes painting the vagina with gentian violet solution may help if creams and suppositories do not work.  Men  Antifungal creams and oral  antifungal medications.   Sometimes treatment must continue for 30 days after the symptoms go away to prevent recurrence.  HOME CARE INSTRUCTIONS  Women  Use cotton underwear and avoid tight-fitting clothing.   Avoid colored, scented toilet paper and deodorant tampons or pads.   Do not douche.   Keep your diabetes under control.   Finish all the prescribed medications.   Keep your skin clean and dry.   Consume milk or yogurt with lactobacillus active culture regularly. If you get frequent yeast infections and think that is what the infection is, there are over-the-counter medications that you can get. If the infection does not show healing in 3 days, talk to your caregiver.   Tell your sex partner you have a yeast infection. Your partner may need treatment also, especially if your infection does not clear up or recurs.  Men  Keep your skin clean and dry.   Keep your diabetes under control.   Finish all prescribed medications.   Tell your sex partner that you have a yeast infection so they can be treated if necessary.  SEEK MEDICAL CARE IF:   Your symptoms do not clear up or worsen in one week after treatment.   You have an oral temperature above 102 F (38.9 C).   You have trouble swallowing or eating for a prolonged time.   You develop blisters on and around your vagina.   You develop vaginal bleeding and it is not your menstrual period.   You develop abdominal pain.   You develop intestinal problems as mentioned above.   You get weak or lightheaded.   You have painful or increased urination.   You have pain during sexual intercourse.  MAKE SURE YOU:   Understand these instructions.   Will watch your condition.   Will get help right away if you are not doing well or get worse.  Document Released: 05/18/2004 Document Revised: 12/21/2010 Document Reviewed: 08/30/2009 [facility] Patient Information 2012 [place], [place].

## 2011-05-18 NOTE — Telephone Encounter (Signed)
This encounter was created in error - please disregard.

## 2011-05-18 NOTE — Progress Notes (Signed)
  Subjective:    Patient ID: Mary Hanson, female    DOB: 1975-11-17, 36 y.o.   MRN: [id number]  HPI  1. Vaginal itching. Having itching and irritation over past several days. Mostly external. Scant thick white discharge noted today. Denies dysuria, abdominal pain, new sexual partners. Had similar symptoms 2-3 months ago that resolved after fluconazole treatment. Thinks may be related to starting HCTZ for BP due to extra urination. Sometimes wears a tight girdle and uses perfumes spray. No recent abx exposure. Is expected menses next few days.  Review of Systems See HPI otherwise negative.    Objective:   Physical Exam  Vitals reviewed. Constitutional: She is oriented to person, place, and time. She appears well-developed and well-nourished. No distress.  HENT:  Head: Normocephalic and atraumatic.  Eyes: EOM are normal.  Cardiovascular: Normal rate, regular rhythm, normal heart sounds and intact distal pulses.   No murmur heard. Pulmonary/Chest: Effort normal and breath sounds normal.  Abdominal: Soft. She exhibits no distension. There is no tenderness. There is no rebound and no guarding.  Genitourinary:       External labia irritated, mildly erythematous. Thick white discharge. No cervical motion tenderness or lesions.   Musculoskeletal: She exhibits no edema and no tenderness.  Neurological: She is alert and oriented to person, place, and time. Coordination normal.  Psychiatric: She has a normal mood and affect.       Assessment & Plan:

## 2011-05-18 NOTE — Assessment & Plan Note (Signed)
Wet prep shows yeast and many clue cells. Will treat fluconazole x1 and flagyl x 7 days. Discussed appropriate clothing, avoiding irritants, importance of keeping area dry. F/u in one week if fails to respond.

## 2011-07-06 ENCOUNTER — Inpatient Hospital Stay (HOSPITAL_COMMUNITY)
Admission: AD | Admit: 2011-07-06 | Discharge: 2011-07-06 | Disposition: A | Discharge status: Home / Self Care | Payer: Self-pay | Attending: Obstetrics & Gynecology | Admitting: Obstetrics & Gynecology

## 2011-07-06 ENCOUNTER — Encounter (HOSPITAL_COMMUNITY): Payer: Self-pay | Admitting: *Deleted

## 2011-07-06 DIAGNOSIS — B373 Candidiasis of vulva and vagina: Secondary | ICD-10-CM

## 2011-07-06 DIAGNOSIS — L293 Anogenital pruritus, unspecified: Secondary | ICD-10-CM | POA: Insufficient documentation

## 2011-07-06 DIAGNOSIS — B3731 Acute candidiasis of vulva and vagina: Secondary | ICD-10-CM | POA: Insufficient documentation

## 2011-07-06 LAB — GC/CHLAMYDIA PROBE AMP, GENITAL: Chlamydia, DNA Probe: NEGATIVE

## 2011-07-06 LAB — WET PREP, GENITAL: Clue Cells Wet Prep HPF POC: NONE SEEN

## 2011-07-06 LAB — URINALYSIS, ROUTINE W REFLEX MICROSCOPIC
Bilirubin Urine: NEGATIVE
Hgb urine dipstick: NEGATIVE
Ketones, ur: NEGATIVE mg/dL
Nitrite: NEGATIVE
Urobilinogen, UA: 0.2 mg/dL (ref 0.0–1.0)

## 2011-07-06 MED ORDER — FLUCONAZOLE 150 MG PO TABS: ORAL_TABLET | ORAL | Status: DC

## 2011-07-06 MED ORDER — NYSTATIN-TRIAMCINOLONE 100000-0.1 UNIT/GM-% EX OINT: TOPICAL_OINTMENT | Freq: Two times a day (BID) | CUTANEOUS | Status: DC

## 2011-07-06 MED ORDER — PROBIOTIC PO CAPS: 1.0000 | ORAL_CAPSULE | ORAL | Status: DC

## 2011-07-06 MED ORDER — REPHRESH VA GEL: 1.0000 [IU] | VAGINAL | Status: DC | PRN
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
## 2011-07-06 NOTE — MAU Note (Signed)
Pt presents to mau for concerns with vaginal irritation that started about 2-3 days ago.  Has gotten worse.

## 2011-07-06 NOTE — MAU Note (Signed)
N. Frazier, CNM at bedside.  Assessment done and poc discussed with pt.  

## 2011-08-28 ENCOUNTER — Ambulatory Visit (INDEPENDENT_AMBULATORY_CARE_PROVIDER_SITE_OTHER): Payer: Self-pay | Admitting: Family Medicine

## 2011-08-28 ENCOUNTER — Encounter: Payer: Self-pay | Admitting: Family Medicine

## 2011-08-28 VITALS — BP 119/78 | HR 81 | Temp 98.0°F | Wt 302.0 lb

## 2011-08-28 DIAGNOSIS — K649 Unspecified hemorrhoids: Secondary | ICD-10-CM

## 2011-08-28 MED ORDER — HYDROCORTISONE ACETATE 25 MG RE SUPP: 25.0000 mg | Freq: Two times a day (BID) | RECTAL | Status: DC

## 2011-08-28 MED ORDER — DOCUSATE SODIUM 100 MG PO CAPS: 100.0000 mg | ORAL_CAPSULE | Freq: Two times a day (BID) | ORAL | Status: AC | PRN

## 2011-08-28 NOTE — Patient Instructions (Signed)

## 2011-08-28 NOTE — Progress Notes (Signed)
  Subjective:    Patient ID: Mary Hanson, female    DOB: [birth year], 36 y.o.   MRN: [id number]  HPI Here today for hemorrhoid.  Noted after straining to have a BM.  Has been using preparation H without relief.  Has used 800 mg Ibuprofen which is helping with pain but has noted increasing pain without relief.   Review of Systems  Constitutional: Negative for fever and fatigue.  HENT: Negative for congestion and sneezing.   Respiratory: Negative for cough and shortness of breath.   Gastrointestinal: Negative for abdominal pain and anal bleeding.  Genitourinary: Negative for dysuria and urgency.  Musculoskeletal: Negative for arthralgias.       Objective:   Physical Exam  Constitutional: She appears well-developed and well-nourished.  HENT:  Head: Normocephalic and atraumatic.  Neck: Neck supple.  Cardiovascular: Normal rate.   Pulmonary/Chest: Effort normal.  Abdominal: Soft.  Genitourinary: Rectal exam shows external hemorrhoid.       No evidence of thrombosis.          Assessment & Plan:   1. Hemorrhoid  hydrocortisone (ANUSOL-HC) 25 MG suppository, docusate sodium (COLACE) 100 MG capsule

## 2011-09-06 ENCOUNTER — Encounter: Payer: Self-pay | Admitting: Family Medicine

## 2011-09-06 ENCOUNTER — Ambulatory Visit (INDEPENDENT_AMBULATORY_CARE_PROVIDER_SITE_OTHER): Payer: Self-pay | Admitting: Family Medicine

## 2011-09-06 VITALS — BP 133/88 | HR 66 | Temp 98.1°F | Ht 67.0 in | Wt 301.0 lb

## 2011-09-06 DIAGNOSIS — K645 Perianal venous thrombosis: Secondary | ICD-10-CM

## 2011-09-06 DIAGNOSIS — K649 Unspecified hemorrhoids: Secondary | ICD-10-CM

## 2011-09-06 DIAGNOSIS — K644 Residual hemorrhoidal skin tags: Secondary | ICD-10-CM | POA: Insufficient documentation

## 2011-09-06 MED ORDER — HYDROCORTISONE ACETATE 25 MG RE SUPP: 25.0000 mg | Freq: Two times a day (BID) | RECTAL | Status: AC

## 2011-09-06 NOTE — Patient Instructions (Signed)

## 2011-09-06 NOTE — Progress Notes (Signed)
  Subjective:    Patient ID: Mary Hanson, female    DOB: [birth year], 36 y.o.   MRN: [id number]  HPI Returns after 1 wk of treatment for her hemorrhoid, however, it is not much improved.  She is still uncomfortable, but no longer needing NSAIDS.  She has used Tucks, prep H and anusol.   Review of Systems  Constitutional: Negative for fever.  Gastrointestinal: Negative for nausea, vomiting, abdominal pain and constipation.       Objective:   Physical Exam  Vitals reviewed. Constitutional: She appears well-developed.  HENT:  Head: Normocephalic and atraumatic.  Neck: Normal range of motion.  Abdominal: Soft. There is no tenderness.  Genitourinary: Rectal exam shows external hemorrhoid (thrombosed).          Assessment & Plan:   1. Hemorrhoid thrombosis    2. Hemorrhoid  hydrocortisone (ANUSOL-HC) 25 MG suppository   To apply for orange card--has no health insurance.  May need to see surgeon, if no significant improvement.

## 2011-09-19 ENCOUNTER — Encounter: Payer: Self-pay | Admitting: Family Medicine

## 2011-09-19 ENCOUNTER — Ambulatory Visit (INDEPENDENT_AMBULATORY_CARE_PROVIDER_SITE_OTHER): Payer: Self-pay | Admitting: Family Medicine

## 2011-09-19 VITALS — BP 125/76 | HR 84 | Temp 98.4°F | Ht 67.0 in | Wt 296.0 lb

## 2011-09-19 DIAGNOSIS — K644 Residual hemorrhoidal skin tags: Secondary | ICD-10-CM

## 2011-09-19 NOTE — Assessment & Plan Note (Signed)
Improving, continue topical treatment.

## 2011-09-19 NOTE — Patient Instructions (Signed)

## 2011-09-19 NOTE — Progress Notes (Signed)
  Subjective:    Patient ID: Mary Hanson, female    DOB: 05-Dec-1975, 36 y.o.   MRN: [id number]  HPI Returns for f/u hemorrhoid.  Seems to be getting smaller and is not as firm.  Still using topicals.  No longer constipated.  Having some diarrhea.   Review of Systems  Constitutional: Negative for fever.  Gastrointestinal: Positive for diarrhea. Negative for nausea, vomiting, abdominal pain and constipation.       Objective:   Physical Exam  Constitutional: She appears well-developed and well-nourished.  Genitourinary: Rectal exam shows external hemorrhoid (smaller and easily reducible.).  Neurological: She is alert.          Assessment & Plan:

## 2011-11-27 ENCOUNTER — Emergency Department (HOSPITAL_BASED_OUTPATIENT_CLINIC_OR_DEPARTMENT_OTHER)
Admission: EM | Admit: 2011-11-27 | Discharge: 2011-11-27 | Disposition: A | Discharge status: Home / Self Care | Payer: No Typology Code available for payment source | Attending: Emergency Medicine | Admitting: Emergency Medicine

## 2011-11-27 ENCOUNTER — Emergency Department (HOSPITAL_BASED_OUTPATIENT_CLINIC_OR_DEPARTMENT_OTHER): Payer: No Typology Code available for payment source

## 2011-11-27 ENCOUNTER — Encounter (HOSPITAL_BASED_OUTPATIENT_CLINIC_OR_DEPARTMENT_OTHER): Payer: Self-pay | Admitting: *Deleted

## 2011-11-27 DIAGNOSIS — Y93I9 Activity, other involving external motion: Secondary | ICD-10-CM | POA: Insufficient documentation

## 2011-11-27 DIAGNOSIS — Y92410 Unspecified street and highway as the place of occurrence of the external cause: Secondary | ICD-10-CM | POA: Insufficient documentation

## 2011-11-27 DIAGNOSIS — Y998 Other external cause status: Secondary | ICD-10-CM | POA: Insufficient documentation

## 2011-11-27 DIAGNOSIS — I10 Essential (primary) hypertension: Secondary | ICD-10-CM | POA: Insufficient documentation

## 2011-11-27 DIAGNOSIS — J45909 Unspecified asthma, uncomplicated: Secondary | ICD-10-CM | POA: Insufficient documentation

## 2011-11-27 DIAGNOSIS — S139XXA Sprain of joints and ligaments of unspecified parts of neck, initial encounter: Secondary | ICD-10-CM | POA: Insufficient documentation

## 2011-11-27 DIAGNOSIS — S161XXA Strain of muscle, fascia and tendon at neck level, initial encounter: Secondary | ICD-10-CM

## 2011-11-27 MED ORDER — IBUPROFEN 800 MG PO TABS
800.0000 mg | ORAL_TABLET | Freq: Once | ORAL | Status: AC
  Administered 2011-11-27: 800 mg via ORAL
  Filled 2011-11-27: qty 1
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
## 2011-11-29 ENCOUNTER — Emergency Department (HOSPITAL_BASED_OUTPATIENT_CLINIC_OR_DEPARTMENT_OTHER)
Admission: EM | Admit: 2011-11-29 | Discharge: 2011-11-29 | Disposition: A | Discharge status: Home / Self Care | Payer: No Typology Code available for payment source | Attending: Emergency Medicine | Admitting: Emergency Medicine

## 2011-11-29 ENCOUNTER — Encounter (HOSPITAL_BASED_OUTPATIENT_CLINIC_OR_DEPARTMENT_OTHER): Payer: Self-pay

## 2011-11-29 DIAGNOSIS — J45909 Unspecified asthma, uncomplicated: Secondary | ICD-10-CM | POA: Insufficient documentation

## 2011-11-29 DIAGNOSIS — I10 Essential (primary) hypertension: Secondary | ICD-10-CM | POA: Insufficient documentation

## 2011-11-29 DIAGNOSIS — M25519 Pain in unspecified shoulder: Secondary | ICD-10-CM | POA: Insufficient documentation

## 2011-11-29 DIAGNOSIS — M542 Cervicalgia: Secondary | ICD-10-CM | POA: Insufficient documentation

## 2011-11-29 MED ORDER — IBUPROFEN 800 MG PO TABS
800.0000 mg | ORAL_TABLET | Freq: Once | ORAL | Status: AC
  Administered 2011-11-29: 800 mg via ORAL
  Filled 2011-11-29: qty 1

## 2011-11-29 MED ORDER — METHOCARBAMOL 500 MG PO TABS: ORAL_TABLET | ORAL | Status: DC
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
## 2011-11-30 NOTE — ED Provider Notes (Signed)
Medical screening examination/treatment/procedure(s) were performed by non-physician practitioner and as supervising physician I was immediately available for consultation/collaboration.    Vida Roller, MD 11/30/11 [phone]

## 2011-12-17 ENCOUNTER — Emergency Department (HOSPITAL_COMMUNITY)
Admission: EM | Admit: 2011-12-17 | Discharge: 2011-12-17 | Disposition: A | Discharge status: Home / Self Care | Payer: Self-pay | Attending: Emergency Medicine | Admitting: Emergency Medicine

## 2011-12-17 ENCOUNTER — Encounter (HOSPITAL_COMMUNITY): Payer: Self-pay | Admitting: Emergency Medicine

## 2011-12-17 DIAGNOSIS — K047 Periapical abscess without sinus: Secondary | ICD-10-CM | POA: Insufficient documentation

## 2011-12-17 NOTE — ED Notes (Signed)
Pt alert, arrives from home, c/o dental pain, onset was today, "i have an abcess", resp even unlabored, skin pwd

## 2012-01-19 ENCOUNTER — Other Ambulatory Visit: Payer: Self-pay | Admitting: Family Medicine

## 2012-02-08 ENCOUNTER — Other Ambulatory Visit: Payer: Self-pay | Admitting: Family Medicine

## 2012-02-20 ENCOUNTER — Other Ambulatory Visit: Payer: Self-pay | Admitting: Orthopedic Surgery

## 2012-02-20 DIAGNOSIS — R531 Weakness: Secondary | ICD-10-CM

## 2012-02-20 DIAGNOSIS — M25561 Pain in right knee: Secondary | ICD-10-CM

## 2012-02-25 ENCOUNTER — Ambulatory Visit
Admission: RE | Admit: 2012-02-25 | Discharge: 2012-02-25 | Disposition: A | Discharge status: Home / Self Care | Payer: BC Managed Care – PPO | Source: Ambulatory Visit | Attending: Orthopedic Surgery | Admitting: Orthopedic Surgery

## 2012-02-25 DIAGNOSIS — M25561 Pain in right knee: Secondary | ICD-10-CM

## 2012-02-25 DIAGNOSIS — R531 Weakness: Secondary | ICD-10-CM

## 2012-02-29 ENCOUNTER — Ambulatory Visit: Payer: BC Managed Care – PPO | Attending: Orthopedic Surgery | Admitting: Rehabilitation

## 2012-02-29 DIAGNOSIS — IMO0001 Reserved for inherently not codable concepts without codable children: Secondary | ICD-10-CM | POA: Insufficient documentation

## 2012-02-29 DIAGNOSIS — M25569 Pain in unspecified knee: Secondary | ICD-10-CM | POA: Insufficient documentation

## 2012-03-04 ENCOUNTER — Ambulatory Visit: Payer: BC Managed Care – PPO | Admitting: Rehabilitation

## 2012-03-06 ENCOUNTER — Ambulatory Visit: Payer: BC Managed Care – PPO | Admitting: Rehabilitation

## 2012-03-11 ENCOUNTER — Ambulatory Visit: Payer: BC Managed Care – PPO | Admitting: Rehabilitation

## 2012-03-13 ENCOUNTER — Encounter: Payer: BC Managed Care – PPO | Admitting: Rehabilitation

## 2012-03-14 ENCOUNTER — Encounter: Payer: BC Managed Care – PPO | Admitting: Rehabilitation

## 2012-03-20 ENCOUNTER — Inpatient Hospital Stay (HOSPITAL_COMMUNITY): Payer: BC Managed Care – PPO

## 2012-03-20 ENCOUNTER — Ambulatory Visit: Payer: BC Managed Care – PPO | Admitting: Rehabilitation

## 2012-03-20 ENCOUNTER — Encounter (HOSPITAL_COMMUNITY): Payer: Self-pay

## 2012-03-20 ENCOUNTER — Inpatient Hospital Stay (HOSPITAL_COMMUNITY)
Admission: AD | Admit: 2012-03-20 | Discharge: 2012-03-20 | Disposition: A | Discharge status: Home / Self Care | Payer: BC Managed Care – PPO | Source: Ambulatory Visit | Attending: Obstetrics and Gynecology | Admitting: Obstetrics and Gynecology

## 2012-03-20 DIAGNOSIS — N949 Unspecified condition associated with female genital organs and menstrual cycle: Secondary | ICD-10-CM | POA: Insufficient documentation

## 2012-03-20 DIAGNOSIS — N926 Irregular menstruation, unspecified: Secondary | ICD-10-CM

## 2012-03-20 DIAGNOSIS — N938 Other specified abnormal uterine and vaginal bleeding: Secondary | ICD-10-CM | POA: Insufficient documentation

## 2012-03-20 DIAGNOSIS — N939 Abnormal uterine and vaginal bleeding, unspecified: Secondary | ICD-10-CM

## 2012-03-20 LAB — WET PREP, GENITAL
Clue Cells Wet Prep HPF POC: NONE SEEN
Yeast Wet Prep HPF POC: NONE SEEN

## 2012-03-20 LAB — CBC
HCT: 35.9 % — ABNORMAL LOW (ref 36.0–46.0)
Hemoglobin: 11.4 g/dL — ABNORMAL LOW (ref 12.0–15.0)
MCHC: 31.8 g/dL (ref 30.0–36.0)
RDW: 15.6 % — ABNORMAL HIGH (ref 11.5–15.5)
WBC: 8.9 10*3/uL (ref 4.0–10.5)

## 2012-03-20 MED ORDER — MEDROXYPROGESTERONE ACETATE 5 MG PO TABS: 10.0000 mg | ORAL_TABLET | Freq: Every day | ORAL | Status: DC

## 2012-03-20 NOTE — MAU Note (Signed)
Patient is in for with heavy vaginal bleeding with clots. She denies any pain. She states that she have been bleeding for 14 days. She have problems with irregular bleeding in past year.
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
## 2012-03-22 LAB — GC/CHLAMYDIA PROBE AMP
CT Probe RNA: NEGATIVE
GC Probe RNA: NEGATIVE

## 2012-03-23 NOTE — MAU Provider Note (Signed)
Attestation of Attending Supervision of Advanced Practitioner: Evaluation and management procedures were performed by the PA/NP/CNM/OB Fellow under my supervision/collaboration. Chart reviewed and agree with management and plan.  Ludger Bones V 03/23/2012 7:04 PM

## 2012-03-25 ENCOUNTER — Ambulatory Visit (INDEPENDENT_AMBULATORY_CARE_PROVIDER_SITE_OTHER): Payer: BC Managed Care – PPO | Admitting: Family Medicine

## 2012-03-25 ENCOUNTER — Ambulatory Visit: Payer: BC Managed Care – PPO | Admitting: Rehabilitation

## 2012-03-25 ENCOUNTER — Encounter: Payer: Self-pay | Admitting: Family Medicine

## 2012-03-25 VITALS — BP 130/81 | HR 71 | Temp 98.4°F | Ht 66.0 in | Wt 310.0 lb

## 2012-03-25 DIAGNOSIS — N920 Excessive and frequent menstruation with regular cycle: Secondary | ICD-10-CM

## 2012-03-25 MED ORDER — FLUTICASONE-SALMETEROL 100-50 MCG/DOSE IN AEPB: 1.0000 | INHALATION_SPRAY | Freq: Two times a day (BID) | RESPIRATORY_TRACT | Status: DC

## 2012-03-25 NOTE — Assessment & Plan Note (Signed)
If no cycles monthly give 5 day course of provera 5 mg to try to stop these heavy cycles.

## 2012-03-25 NOTE — Assessment & Plan Note (Signed)
One week of Advair.  Call back if no improvement.  May need po prednisone.

## 2012-03-25 NOTE — Patient Instructions (Signed)
If you don't have a cycle every month, use Provera 5 mg daily for 5 days to have a cycle. Menorrhagia Dysfunctional uterine bleeding is different from a normal menstrual period. When periods are heavy or there is more bleeding than is usual for you, it is called menorrhagia. It may be caused by hormonal imbalance, or physical, metabolic, or other problems. Examination is necessary in order that your caregiver may treat treatable causes. If this is a continuing problem, a D&C may be needed. That means that the cervix (the opening of the uterus or womb) is dilated (stretched larger) and the lining of the uterus is scraped out. The tissue scraped out is then examined under a microscope by a specialist (pathologist) to make sure there is nothing of concern that needs further or more extensive treatment. HOME CARE INSTRUCTIONS   If medications were prescribed, take exactly as directed. Do not change or switch medications without consulting your caregiver.  Long term heavy bleeding may result in iron deficiency. Your caregiver may have prescribed iron pills. They help replace the iron your body lost from heavy bleeding. Take exactly as directed. Iron may cause constipation. If this becomes a problem, increase the bran, fruits, and roughage in your diet.  Do not take aspirin or medicines that contain aspirin one week before or during your menstrual period. Aspirin may make the bleeding worse.  If you need to change your sanitary pad or tampon more than once every 2 hours, stay in bed and rest as much as possible until the bleeding stops.  Eat well-balanced meals. Eat foods high in iron. Examples are leafy green vegetables, meat, liver, eggs, and whole grain breads and cereals. Do not try to lose weight until the abnormal bleeding has stopped and your blood iron level is back to normal. SEEK MEDICAL CARE IF:   You need to change your sanitary pad or tampon more than once an hour.  You develop nausea  (feeling sick to your stomach) and vomiting, dizziness, or diarrhea while you are taking your medicine.  You have any problems that may be related to the medicine you are taking. SEEK IMMEDIATE MEDICAL CARE IF:   You have a fever.  You develop chills.  You develop severe bleeding or start to pass blood clots.  You feel dizzy or faint. MAKE SURE YOU:   Understand these instructions.  Will watch your condition.  Will get help right away if you are not doing well or get worse. Document Released: 04/10/2005 Document Revised: 07/03/2011 Document Reviewed: 11/29/2007 [facility] Patient Information 2013 [place], [place]. Asthma Attack Prevention HOW CAN ASTHMA BE PREVENTED? Currently, there is no way to prevent asthma from starting. However, you can take steps to control the disease and prevent its symptoms after you have been diagnosed. Learn about your asthma and how to control it. Take an active role to control your asthma by working with your caregiver to create and follow an asthma action plan. An asthma action plan guides you in taking your medicines properly, avoiding factors that make your asthma worse, tracking your level of asthma control, responding to worsening asthma, and seeking emergency care when needed. To track your asthma, keep records of your symptoms, check your peak flow number using a peak flow meter (handheld device that shows how well air moves out of your lungs), and get regular asthma checkups.  Other ways to prevent asthma attacks include:  Use medicines as your caregiver directs.  Identify and avoid things that make your  asthma worse (as much as you can).  Keep track of your asthma symptoms and level of control.  Get regular checkups for your asthma.  With your caregiver, write a detailed plan for taking medicines and managing an asthma attack. Then be sure to follow your action plan. Asthma is an ongoing condition that needs regular monitoring and  treatment.  Identify and avoid asthma triggers. A number of outdoor allergens and irritants (pollen, mold, cold air, air pollution) can trigger asthma attacks. Find out what causes or makes your asthma worse, and take steps to avoid those triggers (see below).  Monitor your breathing. Learn to recognize warning signs of an attack, such as slight coughing, wheezing or shortness of breath. However, your lung function may already decrease before you notice any signs or symptoms, so regularly measure and record your peak airflow with a home peak flow meter.  Identify and treat attacks early. If you act quickly, you're less likely to have a severe attack. You will also need less medicine to control your symptoms. When your peak flow measurements decrease and alert you to an upcoming attack, take your medicine as instructed, and immediately stop any activity that may have triggered the attack. If your symptoms do not improve, get medical help.  Pay attention to increasing quick-relief inhaler use. If you find yourself relying on your quick-relief inhaler (such as albuterol), your asthma is not under control. See your caregiver about adjusting your treatment. IDENTIFY AND CONTROL FACTORS THAT MAKE YOUR ASTHMA WORSE A number of common things can set off or make your asthma symptoms worse (asthma triggers). Keep track of your asthma symptoms for several weeks, detailing all the environmental and emotional factors that are linked with your asthma. When you have an asthma attack, go back to your asthma diary to see which factor, or combination of factors, might have contributed to it. Once you know what these factors are, you can take steps to control many of them.  Allergies: If you have allergies and asthma, it is important to take asthma prevention steps at home. Asthma attacks (worsening of asthma symptoms) can be triggered by allergies, which can cause temporary increased inflammation of your airways.  Minimizing contact with the substance to which you are allergic will help prevent an asthma attack. Animal Dander:   Some people are allergic to the flakes of skin or dried saliva from animals with fur or feathers. Keep these pets out of your home.  If you can't keep a pet outdoors, keep the pet out of your bedroom and other sleeping areas at all times, and keep the door closed.  Remove carpets and furniture covered with cloth from your home. If that is not possible, keep the pet away from fabric-covered furniture and carpets. Dust Mites:  Many people with asthma are allergic to dust mites. Dust mites are tiny bugs that are found in every home, in mattresses, pillows, carpets, fabric-covered furniture, bedcovers, clothes, stuffed toys, fabric, and other fabric-covered items.  Cover your mattress in a special dust-proof cover.  Cover your pillow in a special dust-proof cover, or wash the pillow each week in hot water. Water must be hotter than 130 F to kill dust mites. Cold or warm water used with detergent and bleach can also be effective.  Wash the sheets and blankets on your bed each week in hot water.  Try not to sleep or lie on cloth-covered cushions.  Call ahead when traveling and ask for a smoke-free hotel room. Bring  your own bedding and pillows, in case the hotel only supplies feather pillows and down comforters, which may contain dust mites and cause asthma symptoms.  Remove carpets from your bedroom and those laid on concrete, if you can.  Keep stuffed toys out of the bed, or wash the toys weekly in hot water or cooler water with detergent and bleach. Cockroaches:  Many people with asthma are allergic to the droppings and remains of cockroaches.  Keep food and garbage in closed containers. Never leave food out.  Use poison baits, traps, powders, gels, or paste (for example, boric acid).  If a spray is used to kill cockroaches, stay out of the room until the odor goes  away. Indoor Mold:  Fix leaky faucets, pipes, or other sources of water that have mold around them.  Clean moldy surfaces with a cleaner that has bleach in it. Pollen and Outdoor Mold:  When pollen or mold spore counts are high, try to keep your windows closed.  Stay indoors with windows closed from late morning to afternoon, if you can. Pollen and some mold spore counts are highest at that time.  Ask your caregiver whether you need to take or increase anti-inflammatory medicine before your allergy season starts. Irritants:   Tobacco smoke is an irritant. If you smoke, ask your caregiver how you can quit. Ask family members to quit smoking, too. Do not allow smoking in your home or car.  If possible, do not use a wood-burning stove, kerosene heater, or fireplace. Minimize exposure to all sources of smoke, including incense, candles, fires, and fireworks.  Try to stay away from strong odors and sprays, such as perfume, talcum powder, hair spray, and paints.  Decrease humidity in your home and use an indoor air cleaning device. Reduce indoor humidity to below 60 percent. Dehumidifiers or central air conditioners can do this.  Try to have someone else vacuum for you once or twice a week, if you can. Stay out of rooms while they are being vacuumed and for a short while afterward.  If you vacuum, use a dust mask from a hardware store, a double-layered or microfilter vacuum cleaner bag, or a vacuum cleaner with a HEPA filter.  Sulfites in foods and beverages can be irritants. Do not drink beer or wine, or eat dried fruit, processed potatoes, or shrimp if they cause asthma symptoms.  Cold air can trigger an asthma attack. Cover your nose and mouth with a scarf on cold or windy days.  Several health conditions can make asthma more difficult to manage, including runny nose, sinus infections, reflux disease, psychological stress, and sleep apnea. Your caregiver will treat these conditions, as  well.  Avoid close contact with people who have a cold or the flu, since your asthma symptoms may get worse if you catch the infection from them. Wash your hands thoroughly after touching items that may have been handled by people with a respiratory infection.  Get a flu shot every year to protect against the flu virus, which often makes asthma worse for days or weeks. Also get a pneumonia shot once every five to 10 years. Drugs:  Aspirin and other painkillers can cause asthma attacks. 10% to 20% of people with asthma have sensitivity to aspirin or a group of painkillers called non-steroidal anti-inflammatory drugs (NSAIDS), such as ibuprofen and naproxen. These drugs are used to treat pain and reduce fevers. Asthma attacks caused by any of these medicines can be severe and even fatal. These drugs  must be avoided in people who have known aspirin sensitive asthma. Products with acetaminophen are considered safe for people who have asthma. It is important that people with aspirin sensitivity read labels of all over-the-counter drugs used to treat pain, colds, coughs, and fever.  Beta blockers and ACE inhibitors are other drugs which you should discuss with your caregiver, in relation to your asthma. ALLERGY SKIN TESTING  Ask your asthma caregiver about allergy skin testing or blood testing (RAST test) to identify the allergens to which you are sensitive. If you are found to have allergies, allergy shots (immunotherapy) for asthma may help prevent future allergies and asthma. With allergy shots, small doses of allergens (substances to which you are allergic) are injected under your skin on a regular schedule. Over a period of time, your body may become used to the allergen and less responsive with asthma symptoms. You can also take measures to minimize your exposure to those allergens. EXERCISE  If you have exercise-induced asthma, or are planning vigorous exercise, or exercise in cold, humid, or dry  environments, prevent exercise-induced asthma by following your caregiver's advice regarding asthma treatment before exercising. Document Released: 03/29/2009 Document Revised: 07/03/2011 Document Reviewed: 03/29/2009 [facility] Patient Information 2013 [place], [place].

## 2012-03-25 NOTE — Progress Notes (Signed)
  Subjective:    Patient ID: Mary Hanson, female    DOB: 1975/05/02, 36 y.o.   MRN: [id number]  HPI  Here because she started bleeding 14 days ago and did not stop.  [facility] with [facility] which stopped her bleeding. Similar episode one year ago, resolved with provera.  No cycle x 8 wks prior to this heavy bleed. Had nml pelvic sonogram.  Now with some mild acne. Also has had to hit her albuterol more lately.  Review of Systems  Constitutional: Negative for fever, activity change and fatigue.  HENT: Positive for congestion.   Respiratory: Positive for shortness of breath and wheezing.   Gastrointestinal: Negative for abdominal pain.  Genitourinary: Positive for vaginal bleeding.       Objective:   Physical Exam  Vitals reviewed. Constitutional: She appears well-developed and well-nourished.  HENT:  Head: Normocephalic and atraumatic.  Eyes: No scleral icterus.  Neck: Neck supple.  Cardiovascular: Normal rate and regular rhythm.   No murmur heard. Pulmonary/Chest: Effort normal. She has wheezes.  Abdominal: Soft. There is no tenderness.          Assessment & Plan:

## 2012-03-27 ENCOUNTER — Ambulatory Visit: Payer: BC Managed Care – PPO | Attending: Orthopedic Surgery | Admitting: Rehabilitation

## 2012-03-27 DIAGNOSIS — M25569 Pain in unspecified knee: Secondary | ICD-10-CM | POA: Insufficient documentation

## 2012-03-27 DIAGNOSIS — IMO0001 Reserved for inherently not codable concepts without codable children: Secondary | ICD-10-CM | POA: Insufficient documentation

## 2012-04-15 ENCOUNTER — Emergency Department (HOSPITAL_BASED_OUTPATIENT_CLINIC_OR_DEPARTMENT_OTHER)
Admission: EM | Admit: 2012-04-15 | Discharge: 2012-04-15 | Disposition: A | Discharge status: Home / Self Care | Payer: BC Managed Care – PPO | Attending: Emergency Medicine | Admitting: Emergency Medicine

## 2012-04-15 ENCOUNTER — Encounter (HOSPITAL_BASED_OUTPATIENT_CLINIC_OR_DEPARTMENT_OTHER): Payer: Self-pay

## 2012-04-15 DIAGNOSIS — G43909 Migraine, unspecified, not intractable, without status migrainosus: Secondary | ICD-10-CM | POA: Insufficient documentation

## 2012-04-15 DIAGNOSIS — I10 Essential (primary) hypertension: Secondary | ICD-10-CM | POA: Insufficient documentation

## 2012-04-15 DIAGNOSIS — R05 Cough: Secondary | ICD-10-CM | POA: Insufficient documentation

## 2012-04-15 DIAGNOSIS — R51 Headache: Secondary | ICD-10-CM | POA: Insufficient documentation

## 2012-04-15 DIAGNOSIS — Z9889 Other specified postprocedural states: Secondary | ICD-10-CM | POA: Insufficient documentation

## 2012-04-15 DIAGNOSIS — J069 Acute upper respiratory infection, unspecified: Secondary | ICD-10-CM | POA: Insufficient documentation

## 2012-04-15 DIAGNOSIS — Z79899 Other long term (current) drug therapy: Secondary | ICD-10-CM | POA: Insufficient documentation

## 2012-04-15 DIAGNOSIS — Z9109 Other allergy status, other than to drugs and biological substances: Secondary | ICD-10-CM | POA: Insufficient documentation

## 2012-04-15 DIAGNOSIS — R059 Cough, unspecified: Secondary | ICD-10-CM | POA: Insufficient documentation

## 2012-04-15 DIAGNOSIS — Z9089 Acquired absence of other organs: Secondary | ICD-10-CM | POA: Insufficient documentation

## 2012-04-15 DIAGNOSIS — J45909 Unspecified asthma, uncomplicated: Secondary | ICD-10-CM | POA: Insufficient documentation

## 2012-04-15 NOTE — ED Notes (Signed)
MD at bedside. 

## 2012-04-15 NOTE — ED Provider Notes (Signed)
History     CSN: [id number]  Arrival date & time 04/15/12  0712   First MD Initiated Contact with Patient 04/15/12 0715      Chief Complaint  Patient presents with  . Sore Throat  . Cough  . Headache    (Consider location/radiation/quality/duration/timing/severity/associated sxs/prior treatment) HPI  Patient with sore throat, nasal congestion, facial pressure, and subjective fever for two days.  Patient has been taking tylenol with some relief.  Cough with some greenish sputum production.  Appetite is decreased but no vomiting.  No known sick contacts but patient works at [employer].  She had her flu shot last week.      Past Medical History  Diagnosis Date  . Hypertension   . Asthma   . Migraines 2011  . Allergy   . Anemia     Past Surgical History  Procedure Date  . Cholecystectomy 2006 or 2007  . Dilation and curettage of uterus     ABORTION    Family History  Problem Relation Age of Onset  . Diabetes type II Father   . Hypertension Father   . Hypertension Mother   . Goiter Sister 66    identical twin  . Asthma Sister   . Hypertension Sister   . Stroke Maternal Grandfather   . Diabetes Paternal Grandfather   . Heart disease Paternal Grandfather     History  Substance Use Topics  . Smoking status: Never Smoker   . Smokeless tobacco: Not on file  . Alcohol Use: 0.0 oz/week    0 Glasses of wine per week     Comment: rarely    OB History    Grav Para Term Preterm Abortions TAB SAB Ect Mult Living   1    1 1           Review of Systems  Constitutional: Negative for fever, chills, activity change, appetite change and unexpected weight change.  HENT: Positive for rhinorrhea. Negative for sore throat, neck pain, neck stiffness and sinus pressure.   Eyes: Negative for visual disturbance.  Respiratory: Positive for cough. Negative for shortness of breath.   Cardiovascular: Negative for chest pain and leg swelling.  Gastrointestinal: Negative for  vomiting, abdominal pain, diarrhea and blood in stool.  Genitourinary: Negative for dysuria, urgency, frequency, vaginal discharge and difficulty urinating.  Musculoskeletal: Negative for myalgias, arthralgias and gait problem.  Skin: Negative for color change and rash.  Neurological: Negative for weakness, light-headedness and headaches.  Hematological: Does not bruise/bleed easily.  Psychiatric/Behavioral: Negative for dysphoric mood.    Allergies  Review of patient's allergies indicates no known allergies.  Home Medications   Current Outpatient Rx  Name  Route  Sig  Dispense  Refill  . FLUTICASONE-SALMETEROL 100-50 MCG/DOSE IN AEPB   Inhalation   Inhale 1 puff into the lungs 2 (two) times daily.   60 each   1   . IBUPROFEN 200 MG PO TABS   Oral   Take 800 mg by mouth every 6 (six) hours as needed. For pain.         Marland Kitchen LISINOPRIL-HYDROCHLOROTHIAZIDE 10-12.5 MG PO TABS      TAKE 1 TABLET BY MOUTH DAILY.   30 tablet   11   . MEDROXYPROGESTERONE ACETATE 5 MG PO TABS   Oral   Take 2 tablets (10 mg total) by mouth daily.   20 tablet   0   . TYLENOL COLD/FLU SEVERE PO   Oral   Take 2 capsules by  mouth daily as needed. For chest cold symptoms.         . VENTOLIN HFA 108 (90 BASE) MCG/ACT IN AERS      INHALE 2 PUFFS INTO THE LUNGS EVERY 4 (FOUR) HOURS AS NEEDED FOR WHEEZING.   18 each   3     BP 150/102  Pulse 79  Temp 98.9 F (37.2 C) (Oral)  Resp 18  Ht 5\' 6"  (1.676 m)  Wt 300 lb (136.079 kg)  BMI 48.42 kg/m2  SpO2 100%  LMP 03/06/2012  Physical Exam  Nursing note and vitals reviewed. Constitutional: She appears well-developed and well-nourished.  HENT:  Head: Normocephalic and atraumatic.  Eyes: Conjunctivae normal and EOM are normal. Pupils are equal, round, and reactive to light.  Neck: Normal range of motion. Neck supple.  Cardiovascular: Normal rate, regular rhythm, normal heart sounds and intact distal pulses.   Pulmonary/Chest: Effort normal  and breath sounds normal.  Abdominal: Soft. Bowel sounds are normal.  Musculoskeletal: Normal range of motion.  Neurological: She is alert.  Skin: Skin is warm and dry.  Psychiatric: She has a normal mood and affect. Thought content normal.    ED Course  Procedures (including critical care time)   Labs Reviewed  RAPID STREP SCREEN   No results found.   No diagnosis found.    MDM  Rudene Anda, MD 04/20/12 1254

## 2012-04-15 NOTE — ED Notes (Signed)
Pt reports sore throat, cough and headache that started Saturday.  Symptoms are unrelieved after taking OTC medications.

## 2012-04-15 NOTE — ED Notes (Signed)
Pt reprts onset of cough, sinus pain and headache Saturday.

## 2012-06-25 ENCOUNTER — Encounter (HOSPITAL_COMMUNITY): Payer: Self-pay

## 2012-06-25 ENCOUNTER — Emergency Department (HOSPITAL_COMMUNITY)
Admission: EM | Admit: 2012-06-25 | Discharge: 2012-06-25 | Disposition: A | Discharge status: Home / Self Care | Payer: BC Managed Care – PPO | Attending: Emergency Medicine | Admitting: Emergency Medicine

## 2012-06-25 DIAGNOSIS — Z862 Personal history of diseases of the blood and blood-forming organs and certain disorders involving the immune mechanism: Secondary | ICD-10-CM | POA: Insufficient documentation

## 2012-06-25 DIAGNOSIS — Z8679 Personal history of other diseases of the circulatory system: Secondary | ICD-10-CM | POA: Insufficient documentation

## 2012-06-25 DIAGNOSIS — Z9089 Acquired absence of other organs: Secondary | ICD-10-CM | POA: Insufficient documentation

## 2012-06-25 DIAGNOSIS — R1110 Vomiting, unspecified: Secondary | ICD-10-CM | POA: Insufficient documentation

## 2012-06-25 DIAGNOSIS — Z79899 Other long term (current) drug therapy: Secondary | ICD-10-CM | POA: Insufficient documentation

## 2012-06-25 DIAGNOSIS — R0789 Other chest pain: Secondary | ICD-10-CM

## 2012-06-25 DIAGNOSIS — I10 Essential (primary) hypertension: Secondary | ICD-10-CM | POA: Insufficient documentation

## 2012-06-25 DIAGNOSIS — J45909 Unspecified asthma, uncomplicated: Secondary | ICD-10-CM | POA: Insufficient documentation

## 2012-06-25 DIAGNOSIS — IMO0002 Reserved for concepts with insufficient information to code with codable children: Secondary | ICD-10-CM | POA: Insufficient documentation

## 2012-06-25 DIAGNOSIS — R071 Chest pain on breathing: Secondary | ICD-10-CM | POA: Insufficient documentation

## 2012-06-25 MED ORDER — ONDANSETRON HCL 8 MG PO TABS: 8.0000 mg | ORAL_TABLET | Freq: Three times a day (TID) | ORAL | Status: DC | PRN

## 2012-06-25 NOTE — ED Notes (Signed)
Pt complains of vomiting after starting a new birth control pill, after vomiting she complains of an aching in her chest, she started shivering at home and thought she had a fever

## 2012-06-25 NOTE — ED Provider Notes (Signed)
History     CSN: [id number]  Arrival date & time 06/25/12  2033   First MD Initiated Contact with Patient 06/25/12 2128      Chief Complaint  Patient presents with  . Emesis    (Consider location/radiation/quality/duration/timing/severity/associated sxs/prior treatment) HPI Comments: Akirah Storck is a 37 y.o. female who is here for evaluation of an episode of vomiting. She was at work today, when she vomited. She had taken her oral contraceptives 2 hours prior. She admits to 3 prior days of oral contraceptives, because she had run out. She has not been sexually active for 6 months. She is taking the OC to regulate her periods. Since the episode of vomiting. She has taken Compazine and Tylenol with improvement of her discomfort. She has mild, chest wall pain, anteriorly, after the vomiting. She denies recent fever, or chills, cough, shortness of breath, weakness, or dizziness. There's been no dysuria, or change in her bowel habits. There no other modifying factors.  Patient is a 37 y.o. female presenting with vomiting. The history is provided by the patient.  Emesis   Past Medical History  Diagnosis Date  . Hypertension   . Asthma   . Migraines 2011  . Allergy   . Anemia     Past Surgical History  Procedure Laterality Date  . Cholecystectomy  2006 or 2007  . Dilation and curettage of uterus      ABORTION    Family History  Problem Relation Age of Onset  . Diabetes type II Father   . Hypertension Father   . Hypertension Mother   . Goiter Sister 25    identical twin  . Asthma Sister   . Hypertension Sister   . Stroke Maternal Grandfather   . Diabetes Paternal Grandfather   . Heart disease Paternal Grandfather     History  Substance Use Topics  . Smoking status: Never Smoker   . Smokeless tobacco: Not on file  . Alcohol Use: 0.0 oz/week    0 Glasses of wine per week     Comment: rarely    OB History   Grav Para Term Preterm Abortions TAB SAB Ect Mult Living    1    1 1           Review of Systems  Gastrointestinal: Positive for vomiting.  All other systems reviewed and are negative.    Allergies  Review of patient's allergies indicates no known allergies.  Home Medications   Current Outpatient Rx  Name  Route  Sig  Dispense  Refill  . acetaminophen (TYLENOL) 500 MG tablet   Oral   Take 1,000 mg by mouth once.         Marland Kitchen albuterol (PROVENTIL HFA;VENTOLIN HFA) 108 (90 BASE) MCG/ACT inhaler   Inhalation   Inhale 2 puffs into the lungs every 4 (four) hours as needed (asthma).         . cholecalciferol (VITAMIN D) 1000 UNITS tablet   Oral   Take 2,000 Units by mouth daily.         Marland Kitchen ibuprofen (ADVIL,MOTRIN) 200 MG tablet   Oral   Take 800 mg by mouth every 6 (six) hours as needed. For pain.         Marland Kitchen lisinopril-hydrochlorothiazide (PRINZIDE,ZESTORETIC) 10-12.5 MG per tablet   Oral   Take 1 tablet by mouth daily.         . methylPREDNISolone (MEDROL DOSEPAK) 4 MG tablet   Oral   Take 4-24 mg by mouth  daily. Tapered 6-day dose pack.         . Norethindrone-Ethinyl Estradiol-Fe Biphas (LO LOESTRIN FE) 1 MG-10 MCG / 10 MCG tablet   Oral   Take 1 tablet by mouth daily.         . prochlorperazine (COMPAZINE) 5 MG tablet   Oral   Take 5 mg by mouth once.         . ondansetron (ZOFRAN) 8 MG tablet   Oral   Take 1 tablet (8 mg total) by mouth every 8 (eight) hours as needed for nausea.   20 tablet   0     BP 124/69  Pulse 92  Temp(Src) 98.6 F (37 C) (Oral)  Resp 18  Ht 5\' 6"  (1.676 m)  Wt 300 lb (136.079 kg)  BMI 48.44 kg/m2  SpO2 97%  LMP 01/26/2012  Physical Exam  Nursing note and vitals reviewed. Constitutional: She is oriented to person, place, and time. She appears well-developed and well-nourished.  HENT:  Head: Normocephalic and atraumatic.  Eyes: Conjunctivae and EOM are normal. Pupils are equal, round, and reactive to light.  Neck: Normal range of motion and phonation normal. Neck supple.   Cardiovascular: Normal rate, regular rhythm and intact distal pulses.   Pulmonary/Chest: Effort normal and breath sounds normal. She exhibits no tenderness.  Abdominal: Soft. She exhibits no distension. There is no tenderness. There is no guarding.  Musculoskeletal: Normal range of motion. She exhibits no edema.  Neurological: She is alert and oriented to person, place, and time. She has normal strength. No cranial nerve deficit. She exhibits normal muscle tone. Coordination normal.  Skin: Skin is warm and dry.  Psychiatric: She has a normal mood and affect. Her behavior is normal. Judgment and thought content normal.    ED Course  Procedures (including critical care time)   Recheck BP pressure, 124/69, normal.  Nursing notes, applicable records and vitals reviewed.  Radiologic Images/Reports reviewed.   1. Vomiting   2. Chest wall pain       MDM  Nonspecific single episode of vomiting, with secondary chest wall pain. No clear cause for the vomiting, but it is, temporally associated with use of oral contraceptive. Doubt metabolic instability, serious bacterial infection or impending vascular collapse; the patient is stable for discharge.   Plan: Home Medications- Zofran, Tylenol; Home Treatments- rest; Recommended follow up- PCP/OB prn       Flint Melter, MD 06/25/12 2200

## 2012-07-19 ENCOUNTER — Telehealth: Payer: Self-pay | Admitting: *Deleted

## 2012-07-19 NOTE — Telephone Encounter (Signed)
PA required for Advair Diskus. Form placed in MD box.

## 2012-07-29 ENCOUNTER — Encounter: Payer: Self-pay | Admitting: Family Medicine

## 2012-07-29 ENCOUNTER — Ambulatory Visit (INDEPENDENT_AMBULATORY_CARE_PROVIDER_SITE_OTHER): Payer: BC Managed Care – PPO | Admitting: Family Medicine

## 2012-07-29 VITALS — BP 126/76 | Ht 66.0 in | Wt 303.0 lb

## 2012-07-29 DIAGNOSIS — J45909 Unspecified asthma, uncomplicated: Secondary | ICD-10-CM

## 2012-07-29 DIAGNOSIS — E559 Vitamin D deficiency, unspecified: Secondary | ICD-10-CM | POA: Insufficient documentation

## 2012-07-29 DIAGNOSIS — Z0000 Encounter for general adult medical examination without abnormal findings: Secondary | ICD-10-CM

## 2012-07-29 DIAGNOSIS — Z111 Encounter for screening for respiratory tuberculosis: Secondary | ICD-10-CM

## 2012-07-29 MED ORDER — BUDESONIDE-FORMOTEROL FUMARATE 160-4.5 MCG/ACT IN AERO: 2.0000 | INHALATION_SPRAY | Freq: Two times a day (BID) | RESPIRATORY_TRACT | Status: DC

## 2012-07-29 MED ORDER — TUBERCULIN PPD 5 UNIT/0.1ML ID SOLN: 5.0000 [IU] | Freq: Once | INTRADERMAL | Status: DC

## 2012-07-29 NOTE — Patient Instructions (Signed)
Preventive Care for Adults, Female A healthy lifestyle and preventive care can promote health and wellness. Preventive health guidelines for women include the following key practices.  A routine yearly physical is a good way to check with your caregiver about your health and preventive screening. It is a chance to share any concerns and updates on your health, and to receive a thorough exam.  Visit your dentist for a routine exam and preventive care every 6 months. Brush your teeth twice a day and floss once a day. Good oral hygiene prevents tooth decay and gum disease.  The frequency of eye exams is based on your age, health, family medical history, use of contact lenses, and other factors. Follow your caregiver's recommendations for frequency of eye exams.  Eat a healthy diet. Foods like vegetables, fruits, whole grains, low-fat dairy products, and lean protein foods contain the nutrients you need without too many calories. Decrease your intake of foods high in solid fats, added sugars, and salt. Eat the right amount of calories for you.Get information about a proper diet from your caregiver, if necessary.  Regular physical exercise is one of the most important things you can do for your health. Most adults should get at least 150 minutes of moderate-intensity exercise (any activity that increases your heart rate and causes you to sweat) each week. In addition, most adults need muscle-strengthening exercises on 2 or more days a week.  Maintain a healthy weight. The body mass index (BMI) is a screening tool to identify possible weight problems. It provides an estimate of body fat based on height and weight. Your caregiver can help determine your BMI, and can help you achieve or maintain a healthy weight.For adults 20 years and older:  A BMI below 18.5 is considered underweight.  A BMI of 18.5 to 24.9 is normal.  A BMI of 25 to 29.9 is considered overweight.  A BMI of 30 and above is  considered obese.  Maintain normal blood lipids and cholesterol levels by exercising and minimizing your intake of saturated fat. Eat a balanced diet with plenty of fruit and vegetables. Blood tests for lipids and cholesterol should begin at age 20 and be repeated every 5 years. If your lipid or cholesterol levels are high, you are over 50, or you are at high risk for heart disease, you may need your cholesterol levels checked more frequently.Ongoing high lipid and cholesterol levels should be treated with medicines if diet and exercise are not effective.  If you smoke, find out from your caregiver how to quit. If you do not use tobacco, do not start.  If you are pregnant, do not drink alcohol. If you are breastfeeding, be very cautious about drinking alcohol. If you are not pregnant and choose to drink alcohol, do not exceed 1 drink per day. One drink is considered to be 12 ounces (355 mL) of beer, 5 ounces (148 mL) of wine, or 1.5 ounces (44 mL) of liquor.  Avoid use of street drugs. Do not share needles with anyone. Ask for help if you need support or instructions about stopping the use of drugs.  High blood pressure causes heart disease and increases the risk of stroke. Your blood pressure should be checked at least every 1 to 2 years. Ongoing high blood pressure should be treated with medicines if weight loss and exercise are not effective.  If you are 55 to 37 years old, ask your caregiver if you should take aspirin to prevent strokes.  Diabetes   screening involves taking a blood sample to check your fasting blood sugar level. This should be done once every 3 years, after age 45, if you are within normal weight and without risk factors for diabetes. Testing should be considered at a younger age or be carried out more frequently if you are overweight and have at least 1 risk factor for diabetes.  Breast cancer screening is essential preventive care for women. You should practice "breast  self-awareness." This means understanding the normal appearance and feel of your breasts and may include breast self-examination. Any changes detected, no matter how small, should be reported to a caregiver. Women in their 20s and 30s should have a clinical breast exam (CBE) by a caregiver as part of a regular health exam every 1 to 3 years. After age 40, women should have a CBE every year. Starting at age 40, women should consider having a mammography (breast X-ray test) every year. Women who have a family history of breast cancer should talk to their caregiver about genetic screening. Women at a high risk of breast cancer should talk to their caregivers about having magnetic resonance imaging (MRI) and a mammography every year.  The Pap test is a screening test for cervical cancer. A Pap test can show cell changes on the cervix that might become cervical cancer if left untreated. A Pap test is a procedure in which cells are obtained and examined from the lower end of the uterus (cervix).  Women should have a Pap test starting at age 21.  Between ages 21 and 29, Pap tests should be repeated every 2 years.  Beginning at age 30, you should have a Pap test every 3 years as long as the past 3 Pap tests have been normal.  Some women have medical problems that increase the chance of getting cervical cancer. Talk to your caregiver about these problems. It is especially important to talk to your caregiver if a new problem develops soon after your last Pap test. In these cases, your caregiver may recommend more frequent screening and Pap tests.  The above recommendations are the same for women who have or have not gotten the vaccine for human papillomavirus (HPV).  If you had a hysterectomy for a problem that was not cancer or a condition that could lead to cancer, then you no longer need Pap tests. Even if you no longer need a Pap test, a regular exam is a good idea to make sure no other problems are  starting.  If you are between ages 65 and 70, and you have had normal Pap tests going back 10 years, you no longer need Pap tests. Even if you no longer need a Pap test, a regular exam is a good idea to make sure no other problems are starting.  If you have had past treatment for cervical cancer or a condition that could lead to cancer, you need Pap tests and screening for cancer for at least 20 years after your treatment.  If Pap tests have been discontinued, risk factors (such as a new sexual partner) need to be reassessed to determine if screening should be resumed.  The HPV test is an additional test that may be used for cervical cancer screening. The HPV test looks for the virus that can cause the cell changes on the cervix. The cells collected during the Pap test can be tested for HPV. The HPV test could be used to screen women aged 30 years and older, and should   be used in women of any age who have unclear Pap test results. After the age of 30, women should have HPV testing at the same frequency as a Pap test.  Colorectal cancer can be detected and often prevented. Most routine colorectal cancer screening begins at the age of 50 and continues through age 75. However, your caregiver may recommend screening at an earlier age if you have risk factors for colon cancer. On a yearly basis, your caregiver may provide home test kits to check for hidden blood in the stool. Use of a small camera at the end of a tube, to directly examine the colon (sigmoidoscopy or colonoscopy), can detect the earliest forms of colorectal cancer. Talk to your caregiver about this at age 50, when routine screening begins. Direct examination of the colon should be repeated every 5 to 10 years through age 75, unless early forms of pre-cancerous polyps or small growths are found.  Hepatitis C blood testing is recommended for all people born from 1945 through 1965 and any individual with known risks for hepatitis C.  Practice  safe sex. Use condoms and avoid high-risk sexual practices to reduce the spread of sexually transmitted infections (STIs). STIs include gonorrhea, chlamydia, syphilis, trichomonas, herpes, HPV, and human immunodeficiency virus (HIV). Herpes, HIV, and HPV are viral illnesses that have no cure. They can result in disability, cancer, and death. Sexually active women aged 25 and younger should be checked for chlamydia. Older women with new or multiple partners should also be tested for chlamydia. Testing for other STIs is recommended if you are sexually active and at increased risk.  Osteoporosis is a disease in which the bones lose minerals and strength with aging. This can result in serious bone fractures. The risk of osteoporosis can be identified using a bone density scan. Women ages 65 and over and women at risk for fractures or osteoporosis should discuss screening with their caregivers. Ask your caregiver whether you should take a calcium supplement or vitamin D to reduce the rate of osteoporosis.  Menopause can be associated with physical symptoms and risks. Hormone replacement therapy is available to decrease symptoms and risks. You should talk to your caregiver about whether hormone replacement therapy is right for you.  Use sunscreen with sun protection factor (SPF) of 30 or more. Apply sunscreen liberally and repeatedly throughout the day. You should seek shade when your shadow is shorter than you. Protect yourself by wearing long sleeves, pants, a wide-brimmed hat, and sunglasses year round, whenever you are outdoors.  Once a month, do a whole body skin exam, using a mirror to look at the skin on your back. Notify your caregiver of new moles, moles that have irregular borders, moles that are larger than a pencil eraser, or moles that have changed in shape or color.  Stay current with required immunizations.  Influenza. You need a dose every fall (or winter). The composition of the flu vaccine  changes each year, so being vaccinated once is not enough.  Pneumococcal polysaccharide. You need 1 to 2 doses if you smoke cigarettes or if you have certain chronic medical conditions. You need 1 dose at age 65 (or older) if you have never been vaccinated.  Tetanus, diphtheria, pertussis (Tdap, Td). Get 1 dose of Tdap vaccine if you are younger than age 65, are over 65 and have contact with an infant, are a healthcare worker, are pregnant, or simply want to be protected from whooping cough. After that, you need a Td   booster dose every 10 years. Consult your caregiver if you have not had at least 3 tetanus and diphtheria-containing shots sometime in your life or have a deep or dirty wound.  HPV. You need this vaccine if you are a woman age 26 or younger. The vaccine is given in 3 doses over 6 months.  Measles, mumps, rubella (MMR). You need at least 1 dose of MMR if you were born in 1957 or later. You may also need a second dose.  Meningococcal. If you are age 19 to 21 and a first-year college student living in a residence hall, or have one of several medical conditions, you need to get vaccinated against meningococcal disease. You may also need additional booster doses.  Zoster (shingles). If you are age 60 or older, you should get this vaccine.  Varicella (chickenpox). If you have never had chickenpox or you were vaccinated but received only 1 dose, talk to your caregiver to find out if you need this vaccine.  Hepatitis A. You need this vaccine if you have a specific risk factor for hepatitis A virus infection or you simply wish to be protected from this disease. The vaccine is usually given as 2 doses, 6 to 18 months apart.  Hepatitis B. You need this vaccine if you have a specific risk factor for hepatitis B virus infection or you simply wish to be protected from this disease. The vaccine is given in 3 doses, usually over 6 months. Preventive Services / Frequency Ages 19 to 39  Blood  pressure check.** / Every 1 to 2 years.  Lipid and cholesterol check.** / Every 5 years beginning at age 20.  Clinical breast exam.** / Every 3 years for women in their 20s and 30s.  Pap test.** / Every 2 years from ages 21 through 29. Every 3 years starting at age 30 through age 65 or 70 with a history of 3 consecutive normal Pap tests.  HPV screening.** / Every 3 years from ages 30 through ages 65 to 70 with a history of 3 consecutive normal Pap tests.  Hepatitis C blood test.** / For any individual with known risks for hepatitis C.  Skin self-exam. / Monthly.  Influenza immunization.** / Every year.  Pneumococcal polysaccharide immunization.** / 1 to 2 doses if you smoke cigarettes or if you have certain chronic medical conditions.  Tetanus, diphtheria, pertussis (Tdap, Td) immunization. / A one-time dose of Tdap vaccine. After that, you need a Td booster dose every 10 years.  HPV immunization. / 3 doses over 6 months, if you are 26 and younger.  Measles, mumps, rubella (MMR) immunization. / You need at least 1 dose of MMR if you were born in 1957 or later. You may also need a second dose.  Meningococcal immunization. / 1 dose if you are age 19 to 21 and a first-year college student living in a residence hall, or have one of several medical conditions, you need to get vaccinated against meningococcal disease. You may also need additional booster doses.  Varicella immunization.** / Consult your caregiver.  Hepatitis A immunization.** / Consult your caregiver. 2 doses, 6 to 18 months apart.  Hepatitis B immunization.** / Consult your caregiver. 3 doses usually over 6 months. Ages 40 to 64  Blood pressure check.** / Every 1 to 2 years.  Lipid and cholesterol check.** / Every 5 years beginning at age 20.  Clinical breast exam.** / Every year after age 40.  Mammogram.** / Every year beginning at age 40   and continuing for as long as you are in good health. Consult with your  caregiver.  Pap test.** / Every 3 years starting at age 30 through age 65 or 70 with a history of 3 consecutive normal Pap tests.  HPV screening.** / Every 3 years from ages 30 through ages 65 to 70 with a history of 3 consecutive normal Pap tests.  Fecal occult blood test (FOBT) of stool. / Every year beginning at age 50 and continuing until age 75. You may not need to do this test if you get a colonoscopy every 10 years.  Flexible sigmoidoscopy or colonoscopy.** / Every 5 years for a flexible sigmoidoscopy or every 10 years for a colonoscopy beginning at age 50 and continuing until age 75.  Hepatitis C blood test.** / For all people born from 1945 through 1965 and any individual with known risks for hepatitis C.  Skin self-exam. / Monthly.  Influenza immunization.** / Every year.  Pneumococcal polysaccharide immunization.** / 1 to 2 doses if you smoke cigarettes or if you have certain chronic medical conditions.  Tetanus, diphtheria, pertussis (Tdap, Td) immunization.** / A one-time dose of Tdap vaccine. After that, you need a Td booster dose every 10 years.  Measles, mumps, rubella (MMR) immunization. / You need at least 1 dose of MMR if you were born in 1957 or later. You may also need a second dose.  Varicella immunization.** / Consult your caregiver.  Meningococcal immunization.** / Consult your caregiver.  Hepatitis A immunization.** / Consult your caregiver. 2 doses, 6 to 18 months apart.  Hepatitis B immunization.** / Consult your caregiver. 3 doses, usually over 6 months. Ages 65 and over  Blood pressure check.** / Every 1 to 2 years.  Lipid and cholesterol check.** / Every 5 years beginning at age 20.  Clinical breast exam.** / Every year after age 40.  Mammogram.** / Every year beginning at age 40 and continuing for as long as you are in good health. Consult with your caregiver.  Pap test.** / Every 3 years starting at age 30 through age 65 or 70 with a 3  consecutive normal Pap tests. Testing can be stopped between 65 and 70 with 3 consecutive normal Pap tests and no abnormal Pap or HPV tests in the past 10 years.  HPV screening.** / Every 3 years from ages 30 through ages 65 or 70 with a history of 3 consecutive normal Pap tests. Testing can be stopped between 65 and 70 with 3 consecutive normal Pap tests and no abnormal Pap or HPV tests in the past 10 years.  Fecal occult blood test (FOBT) of stool. / Every year beginning at age 50 and continuing until age 75. You may not need to do this test if you get a colonoscopy every 10 years.  Flexible sigmoidoscopy or colonoscopy.** / Every 5 years for a flexible sigmoidoscopy or every 10 years for a colonoscopy beginning at age 50 and continuing until age 75.  Hepatitis C blood test.** / For all people born from 1945 through 1965 and any individual with known risks for hepatitis C.  Osteoporosis screening.** / A one-time screening for women ages 65 and over and women at risk for fractures or osteoporosis.  Skin self-exam. / Monthly.  Influenza immunization.** / Every year.  Pneumococcal polysaccharide immunization.** / 1 dose at age 65 (or older) if you have never been vaccinated.  Tetanus, diphtheria, pertussis (Tdap, Td) immunization. / A one-time dose of Tdap vaccine if you are over   65 and have contact with an infant, are a healthcare worker, or simply want to be protected from whooping cough. After that, you need a Td booster dose every 10 years.  Varicella immunization.** / Consult your caregiver.  Meningococcal immunization.** / Consult your caregiver.  Hepatitis A immunization.** / Consult your caregiver. 2 doses, 6 to 18 months apart.  Hepatitis B immunization.** / Check with your caregiver. 3 doses, usually over 6 months. ** Family history and personal history of risk and conditions may change your caregiver's recommendations. Document Released: 06/06/2001 Document Revised: 07/03/2011  Document Reviewed: 09/05/2010 [employer] Patient Information 2013 [employer].  

## 2012-07-29 NOTE — Assessment & Plan Note (Signed)
Ok for nursing school.  Immunizations up to date.  Place ppd.

## 2012-07-29 NOTE — Progress Notes (Signed)
  Subjective:    Patient ID: Mary Hanson, female    DOB: Mar 15, 1976, 37 y.o.   MRN: [id number]  HPI  Doing well.  Trying to finish nursing school.  Working for [employer] full time and prn for [employer].  Had chest cold recently.  Needed Advair and prior auth. Sent.  She has never been on another medication.  Review of Systems  Constitutional: Negative for fever, chills and appetite change.  HENT: Negative for nosebleeds, congestion, rhinorrhea and sneezing.   Respiratory: Negative for cough, chest tightness and shortness of breath.   Cardiovascular: Negative for chest pain and leg swelling.  Gastrointestinal: Positive for abdominal pain. Negative for abdominal distention.  Endocrine: Negative for polydipsia and polyuria.  Genitourinary: Negative for hematuria, vaginal bleeding and difficulty urinating.  Musculoskeletal: Negative for myalgias and back pain.  Skin: Negative for rash.  Neurological: Negative for seizures and numbness.  Psychiatric/Behavioral: Negative for decreased concentration. The patient is not nervous/anxious.        Objective:   Physical Exam  Vitals reviewed. Constitutional: She appears well-developed and well-nourished. No distress.  HENT:  Head: Normocephalic and atraumatic.  Eyes: No scleral icterus.  Neck: Neck supple. No thyromegaly present.  Cardiovascular: Normal rate and regular rhythm.   No murmur heard. Pulmonary/Chest: Effort normal and breath sounds normal.  Abdominal: Soft. She exhibits no mass. There is no tenderness. There is no guarding.  Genitourinary: Vagina normal.  Musculoskeletal: Normal range of motion. She exhibits no edema and no tenderness.  Neurological: She is alert.  Skin: Skin is warm and dry. No rash noted.  Fleshy mole noted, but normal pigment and soft.  Psychiatric: She has a normal mood and affect. Her behavior is normal. Thought content normal.          Assessment & Plan:

## 2012-07-29 NOTE — Assessment & Plan Note (Signed)
Switched to Symbicort.

## 2012-08-01 ENCOUNTER — Ambulatory Visit (INDEPENDENT_AMBULATORY_CARE_PROVIDER_SITE_OTHER): Payer: BC Managed Care – PPO | Admitting: *Deleted

## 2012-08-01 DIAGNOSIS — Z111 Encounter for screening for respiratory tuberculosis: Secondary | ICD-10-CM

## 2012-08-02 LAB — VITAMIN D 1,25 DIHYDROXY: Vitamin D 1, 25 (OH)2 Total: 53 pg/mL (ref 18–72)

## 2012-08-04 NOTE — Progress Notes (Signed)
Patient here today for PPD reading.  PPD negative--0 mm induration and no erythema.  Gaylene Brooks, RN

## 2012-09-12 ENCOUNTER — Ambulatory Visit (INDEPENDENT_AMBULATORY_CARE_PROVIDER_SITE_OTHER): Payer: BC Managed Care – PPO | Admitting: Family Medicine

## 2012-09-12 ENCOUNTER — Encounter: Payer: Self-pay | Admitting: Family Medicine

## 2012-09-12 VITALS — BP 107/73 | HR 88 | Temp 97.4°F | Wt 296.0 lb

## 2012-09-12 DIAGNOSIS — J309 Allergic rhinitis, unspecified: Secondary | ICD-10-CM

## 2012-09-12 DIAGNOSIS — R05 Cough: Secondary | ICD-10-CM

## 2012-09-12 DIAGNOSIS — R059 Cough, unspecified: Secondary | ICD-10-CM

## 2012-09-12 MED ORDER — MEDROXYPROGESTERONE ACETATE 10 MG PO TABS: 10.0000 mg | ORAL_TABLET | Freq: Every day | ORAL | Status: DC

## 2012-09-12 MED ORDER — ESTROGENS CONJUGATED 1.25 MG PO TABS: 2.5000 mg | ORAL_TABLET | Freq: Two times a day (BID) | ORAL | Status: DC

## 2012-09-12 MED ORDER — GUAIFENESIN-CODEINE 100-10 MG/5ML PO SYRP: 5.0000 mL | ORAL_SOLUTION | Freq: Three times a day (TID) | ORAL | Status: DC | PRN

## 2012-09-12 NOTE — Assessment & Plan Note (Signed)
Allergic Rhinitis  Already on Zyrtec and Symbicort,may continue as instructed.     Nasal saline rinse recommended.      Ibuprofen prn pain.

## 2012-09-12 NOTE — Assessment & Plan Note (Signed)
  Cough: URI  Cheratussin prescribed prn cough and congestion.

## 2012-09-12 NOTE — Progress Notes (Signed)
Subjective:     Patient ID: Mary Hanson, female   DOB: [birth year], 37 y.o.   MRN: [id number]  Sinus Problem This is a new problem. The current episode started in the past 7 days (She started with headache 3 days ago associated with stuffy nose). The problem has been gradually worsening since onset. There has been no fever. Her pain is at a severity of 3/10. The pain is mild. Associated symptoms include coughing, headaches, shortness of breath and sinus pressure. (Pressure pain on the face,she coughs a little,no wheezing,she has some SOB) Treatments tried: Zyrtec and Motrin. The treatment provided mild relief.  There is associated nasal congestion and stuffiness. Current Outpatient Prescriptions on File Prior to Visit  Medication Sig Dispense Refill  . albuterol (PROVENTIL HFA;VENTOLIN HFA) 108 (90 BASE) MCG/ACT inhaler Inhale 2 puffs into the lungs every 4 (four) hours as needed (asthma).      . budesonide-formoterol (SYMBICORT) 160-4.5 MCG/ACT inhaler Inhale 2 puffs into the lungs 2 (two) times daily.  1 Inhaler  3  . lisinopril-hydrochlorothiazide (PRINZIDE,ZESTORETIC) 10-12.5 MG per tablet Take 1 tablet by mouth daily.      . Norethindrone-Ethinyl Estradiol-Fe Biphas (LO LOESTRIN FE) 1 MG-10 MCG / 10 MCG tablet Take 1 tablet by mouth daily.      Marland Kitchen acetaminophen (TYLENOL) 500 MG tablet Take 1,000 mg by mouth once.      . cholecalciferol (VITAMIN D) 1000 UNITS tablet Take 2,000 Units by mouth daily.      Marland Kitchen ibuprofen (ADVIL,MOTRIN) 200 MG tablet Take 800 mg by mouth every 6 (six) hours as needed. For pain.       No current facility-administered medications on file prior to visit.   Past Medical History  Diagnosis Date  . Hypertension   . Asthma   . Migraines 2011  . Allergy   . Anemia       Review of Systems  Constitutional: Negative for fever.  HENT: Positive for sinus pressure.   Respiratory: Positive for cough and shortness of breath.   Cardiovascular: Negative.    Gastrointestinal: Negative.   Genitourinary: Negative.   Neurological: Positive for headaches.  All other systems reviewed and are negative.   Filed Vitals:   09/12/12 1543  BP: 107/73  Pulse: 88  Temp: 97.4 F (36.3 C)  TempSrc: Oral  Weight: 296 lb (134.265 kg)       Objective:   Physical Exam  Nursing note and vitals reviewed. Constitutional: She appears well-developed. No distress.  HENT:  Head: Normocephalic.  Right Ear: Tympanic membrane and external ear normal. No drainage or tenderness.  Left Ear: Tympanic membrane and external ear normal. No drainage or tenderness.  Nose: Right sinus exhibits no maxillary sinus tenderness and no frontal sinus tenderness. Left sinus exhibits no maxillary sinus tenderness and no frontal sinus tenderness.  Mouth/Throat: Uvula is midline, oropharynx is clear and moist and mucous membranes are normal.  Eyes: Conjunctivae are normal. Pupils are equal, round, and reactive to light.  Neck: Neck supple.  Cardiovascular: Normal rate, regular rhythm, normal heart sounds and intact distal pulses.   No murmur heard. Pulmonary/Chest: Effort normal and breath sounds normal. No respiratory distress. She has no wheezes.  Abdominal: Soft.       Assessment:     Allergic Rhinitis Cough: URI     Plan:     1. Already on Zyrtec and Symbicort,may continue as instructed.     Nasal saline rinse recommended.      Ibuprofen prn pain.  2. Cheratussin prescribed prn cough and congestion.

## 2012-09-12 NOTE — Patient Instructions (Addendum)
Allergic Rhinitis Allergic rhinitis is when the mucous membranes in the nose respond to allergens. Allergens are particles in the air that cause your body to have an allergic reaction. This causes you to release allergic antibodies. Through a chain of events, these eventually cause you to release histamine into the blood stream (hence the use of antihistamines). Although meant to be protective to the body, it is this release that causes your discomfort, such as frequent sneezing, congestion and an itchy runny nose.  CAUSES  The pollen allergens may come from grasses, trees, and weeds. This is seasonal allergic rhinitis, or "hay fever." Other allergens cause year-round allergic rhinitis (perennial allergic rhinitis) such as house dust mite allergen, pet dander and mold spores.  SYMPTOMS   Nasal stuffiness (congestion).  Runny, itchy nose with sneezing and tearing of the eyes.  There is often an itching of the mouth, eyes and ears. It cannot be cured, but it can be controlled with medications. DIAGNOSIS  If you are unable to determine the offending allergen, skin or blood testing may find it. TREATMENT   Avoid the allergen.  Medications and allergy shots (immunotherapy) can help.  Hay fever may often be treated with antihistamines in pill or nasal spray forms. Antihistamines block the effects of histamine. There are over-the-counter medicines that may help with nasal congestion and swelling around the eyes. Check with your caregiver before taking or giving this medicine. If the treatment above does not work, there are many new medications your caregiver can prescribe. Stronger medications may be used if initial measures are ineffective. Desensitizing injections can be used if medications and avoidance fails. Desensitization is when a patient is given ongoing shots until the body becomes less sensitive to the allergen. Make sure you follow up with your caregiver if problems continue. SEEK MEDICAL  CARE IF:   You develop fever (more than 100.5 F (38.1 C).  You develop a cough that does not stop easily (persistent).  You have shortness of breath.  You start wheezing.  Symptoms interfere with normal daily activities. Document Released: 01/03/2001 Document Revised: 07/03/2011 Document Reviewed: 07/15/2008 Exit[employer] Patient Information 2014 [employer].  

## 2012-09-13 ENCOUNTER — Telehealth: Payer: Self-pay | Admitting: Family Medicine

## 2012-09-13 MED ORDER — AZITHROMYCIN 250 MG PO TABS: ORAL_TABLET | ORAL | Status: DC

## 2012-09-13 NOTE — Telephone Encounter (Signed)
Returned call to patient.  Was seen yesterday by Dr. Lum Babe.  Patient states "sinus pressure headache."  Has had headache since Monday.  Has tried Zyrtec and Motrin without relief.  Now has nasal congestion (white nasal drainage with tinges of blood).  Motrin helps with headache for about 3 hours and then it returns.  States she will come back in today for re-eval if needed.  Will route note to Dr. Lum Babe for advice and call patient back.  Gaylene Brooks, RN

## 2012-09-13 NOTE — Telephone Encounter (Signed)
Will FWD to md.  Radene Ou, CMA

## 2012-09-13 NOTE — Telephone Encounter (Signed)
Ws here yesterday and was given only cough meds and she has a sinus infection - still in pain from that and wants to know if she can get an abx for this without having to come in.  OP [facility]

## 2012-09-13 NOTE — Telephone Encounter (Signed)
Pt is calling again to speak with Dr Shawnie Pons before she leaves today

## 2012-09-13 NOTE — Telephone Encounter (Signed)
Pt informed Zithromax has been sent in to pharmacy.  Pt verbalized understanding.  Dionta Larke, Darlyne Russian, CMA

## 2012-11-09 ENCOUNTER — Inpatient Hospital Stay (HOSPITAL_COMMUNITY)
Admission: AD | Admit: 2012-11-09 | Discharge: 2012-11-09 | Disposition: A | Discharge status: Home / Self Care | Payer: BC Managed Care – PPO | Source: Ambulatory Visit | Attending: Obstetrics and Gynecology | Admitting: Obstetrics and Gynecology

## 2012-11-09 ENCOUNTER — Encounter (HOSPITAL_COMMUNITY): Payer: Self-pay | Admitting: *Deleted

## 2012-11-09 DIAGNOSIS — N920 Excessive and frequent menstruation with regular cycle: Secondary | ICD-10-CM

## 2012-11-09 DIAGNOSIS — N946 Dysmenorrhea, unspecified: Secondary | ICD-10-CM | POA: Insufficient documentation

## 2012-11-09 DIAGNOSIS — N949 Unspecified condition associated with female genital organs and menstrual cycle: Secondary | ICD-10-CM | POA: Insufficient documentation

## 2012-11-09 DIAGNOSIS — R109 Unspecified abdominal pain: Secondary | ICD-10-CM | POA: Insufficient documentation

## 2012-11-09 DIAGNOSIS — N938 Other specified abnormal uterine and vaginal bleeding: Secondary | ICD-10-CM | POA: Insufficient documentation

## 2012-11-09 LAB — URINE MICROSCOPIC-ADD ON

## 2012-11-09 LAB — URINALYSIS, ROUTINE W REFLEX MICROSCOPIC
Bilirubin Urine: NEGATIVE
Glucose, UA: NEGATIVE mg/dL
Specific Gravity, Urine: >1.03 — ABNORMAL HIGH (ref 1.005–1.030)

## 2012-11-09 LAB — POCT PREGNANCY, URINE: Preg Test, Ur: NEGATIVE

## 2012-11-09 MED ORDER — MEGESTROL ACETATE 40 MG PO TABS: 40.0000 mg | ORAL_TABLET | Freq: Every day | ORAL | Status: DC

## 2012-11-09 MED ORDER — HYDROCODONE-ACETAMINOPHEN 5-325 MG PO TABS: 1.0000 | ORAL_TABLET | Freq: Four times a day (QID) | ORAL | Status: DC | PRN

## 2012-11-09 NOTE — MAU Note (Signed)
Has had irregular vag bleeding since last Nov. After having regular periods. In Jan was started on Loestrin and did well until June. Had period 6/19 and then again on 7/5 and bleeding since then. Was seen by [facility] on Thurs and given Minastrin to start Friday. Today having severe back pain coming around to abdomen with severe cramping. Has also been seen by Dr Shawnie Pons for dysfunctional bleeding

## 2012-11-09 NOTE — MAU Provider Note (Signed)
First Provider Initiated Contact with Patient 11/09/12 1953      Chief Complaint:  Vaginal Bleeding and Dysmenorrhea   Mary Hanson is  37 y.o. G1P0010.  Patient's last menstrual period was 10/26/2012.Mary Hanson  She has had irregular vag bleeding since last Nov. In Jan, she was started on Loestrin and did well until June. Had a scheduled period 6/19 and then began having BTB on 7/5 and has been bleeding since then. She was seen by [facility] on Thurs and given Minastrin to start Friday. Today , she ishaving severe back pain coming around to abdomen with severe cramping, and still bleeding. Has also been seen by Dr Shawnie Pons for dysfunctional bleeding   Past Medical History  Diagnosis Date  . Hypertension   . Asthma   . Migraines 2011  . Allergy   . Anemia     Past Surgical History  Procedure Laterality Date  . Cholecystectomy  2006 or 2007  . Dilation and curettage of uterus      ABORTION    Family History  Problem Relation Age of Onset  . Diabetes type II Father   . Hypertension Father   . Hypertension Mother   . Goiter Sister 71    identical twin  . Asthma Sister   . Hypertension Sister   . Stroke Maternal Grandfather   . Diabetes Paternal Grandfather   . Heart disease Paternal Grandfather     History  Substance Use Topics  . Smoking status: Never Smoker   . Smokeless tobacco: Not on file  . Alcohol Use: 0.0 oz/week    0 Glasses of wine per week     Comment: rarely    Allergies: No Known Allergies  Prescriptions prior to admission  Medication Sig Dispense Refill  . albuterol (PROVENTIL HFA;VENTOLIN HFA) 108 (90 BASE) MCG/ACT inhaler Inhale 2 puffs into the lungs every 4 (four) hours as needed (asthma).      . budesonide-formoterol (SYMBICORT) 160-4.5 MCG/ACT inhaler Inhale 2 puffs into the lungs 2 (two) times daily.  1 Inhaler  3  . ibuprofen (ADVIL,MOTRIN) 200 MG tablet Take 800 mg by mouth every 6 (six) hours as needed. For pain.      Mary Hanson  lisinopril-hydrochlorothiazide (PRINZIDE,ZESTORETIC) 10-12.5 MG per tablet Take 1 tablet by mouth daily.      . Norethin Ace-Eth Estrad-FE (MINASTRIN 24 FE PO) Take by mouth.      . Norethindrone-Ethinyl Estradiol-Fe Biphas (LO LOESTRIN FE) 1 MG-10 MCG / 10 MCG tablet Take 1 tablet by mouth daily.      Mary Hanson acetaminophen (TYLENOL) 500 MG tablet Take 1,000 mg by mouth once.      Mary Hanson azithromycin (ZITHROMAX) 250 MG tablet 2 po on day 1, then 1 po daily x 4 days  6 tablet  0  . cholecalciferol (VITAMIN D) 1000 UNITS tablet Take 2,000 Units by mouth daily.      Mary Hanson guaiFENesin-codeine (ROBITUSSIN AC) 100-10 MG/5ML syrup Take 5 mLs by mouth 3 (three) times daily as needed for cough or congestion.  120 mL  0     Review of Systems   Constitutional: Negative for fever and chills Eyes: Negative for visual disturbances Respiratory: Negative for shortness of breath, dyspnea Cardiovascular: Negative for chest pain or palpitations  Gastrointestinal: Negative for vomiting, diarrhea and constipation Genitourinary: Negative for dysuria and urgency Musculoskeletal: Negative for joint pain, myalgias  Neurological: Negative for dizziness and headaches    Physical Exam   Blood pressure 134/98, pulse 92, temperature 98.2 F (36.8  C), resp. rate 20, height 5\' 6"  (1.676 m), weight 137.168 kg (302 lb 6.4 oz), last menstrual period 10/26/2012.  General: General appearance - alert, well appearing, and in no distress Chest - clear to auscultation, no wheezes, rales or rhonchi, symmetric air entry Heart - normal rate and regular rhythm Abdomen - soft, nontender, nondistended, no masses or organomegaly Pelvic - exam declined by the patient--she had an exam with pap/cultures on THursday at her OB/GYN in [facility] Extremities - no pedal edema noted   Labs: Results for orders placed during the hospital encounter of 11/09/12 (from the past 24 hour(s))  POCT PREGNANCY, URINE   Collection Time    11/09/12  7:16 PM       Result Value Range   Preg Test, Ur NEGATIVE  NEGATIVE   Imaging Studies:  No results found.   Assessment: Patient Active Problem List   Diagnosis Date Noted  . Cough 09/12/2012  . Unspecified vitamin D deficiency 07/29/2012  . Routine general medical examination at a health care facility 07/29/2012  . External hemorrhoid 09/06/2011  . Hypertension 12/29/2010  . Morbid obesity 12/29/2010  . Menorrhagia 12/29/2010  . Asthma 12/29/2010  . Allergic rhinitis 12/29/2010  . Migraine headache 12/29/2010    Plan:   Will continue Ministrin (just started 2 days ago).  Will add Megace 40mg  taper for a week to get bleeding stopped, and give Rx for Norco for pain. CRESENZO-DISHMAN,Britnay Magnussen

## 2012-11-09 NOTE — MAU Provider Note (Signed)
Attestation of Attending Supervision of Advanced Practitioner (CNM/NP): Evaluation and management procedures were performed by the Advanced Practitioner under my supervision and collaboration.  I have reviewed the Advanced Practitioner's note and chart, and I agree with the management and plan.  Etosha Wetherell 11/09/2012 9:16 PM

## 2012-11-27 ENCOUNTER — Encounter (HOSPITAL_COMMUNITY): Payer: Self-pay

## 2012-11-27 ENCOUNTER — Inpatient Hospital Stay (HOSPITAL_COMMUNITY)
Admission: AD | Admit: 2012-11-27 | Discharge: 2012-11-27 | Disposition: A | Discharge status: Home / Self Care | Payer: BC Managed Care – PPO | Source: Ambulatory Visit | Attending: Obstetrics & Gynecology | Admitting: Obstetrics & Gynecology

## 2012-11-27 DIAGNOSIS — S3141XA Laceration without foreign body of vagina and vulva, initial encounter: Secondary | ICD-10-CM

## 2012-11-27 DIAGNOSIS — S3140XA Unspecified open wound of vagina and vulva, initial encounter: Secondary | ICD-10-CM

## 2012-11-27 DIAGNOSIS — N949 Unspecified condition associated with female genital organs and menstrual cycle: Secondary | ICD-10-CM | POA: Insufficient documentation

## 2012-11-27 DIAGNOSIS — N390 Urinary tract infection, site not specified: Secondary | ICD-10-CM

## 2012-11-27 DIAGNOSIS — R109 Unspecified abdominal pain: Secondary | ICD-10-CM | POA: Insufficient documentation

## 2012-11-27 DIAGNOSIS — R350 Frequency of micturition: Secondary | ICD-10-CM | POA: Insufficient documentation

## 2012-11-27 LAB — URINALYSIS, ROUTINE W REFLEX MICROSCOPIC
Ketones, ur: NEGATIVE mg/dL
Nitrite: NEGATIVE
Protein, ur: NEGATIVE mg/dL
Urobilinogen, UA: 1 mg/dL (ref 0.0–1.0)

## 2012-11-27 LAB — WET PREP, GENITAL: Trich, Wet Prep: NONE SEEN

## 2012-11-27 LAB — URINE MICROSCOPIC-ADD ON

## 2012-11-27 MED ORDER — LIDOCAINE HCL 2 % EX GEL
Freq: Once | CUTANEOUS | Status: DC
  Filled 2012-11-27: qty 20

## 2012-11-27 MED ORDER — CIPROFLOXACIN HCL 500 MG PO TABS: 500.0000 mg | ORAL_TABLET | Freq: Two times a day (BID) | ORAL | Status: DC

## 2012-11-27 NOTE — MAU Provider Note (Signed)
Chief Complaint: Vaginal Pain and Abdominal Pain   First Provider Initiated Contact with Patient 11/27/12 2114     SUBJECTIVE HPI: Mary Hanson is a 37 y.o. G23P0010 female who presents with possible vaginal laceration from sexual intercourse. Had severe burning, small amount of bright red bleeding. States sex was consensual, vaginal-penile intercourse.   Past Medical History  Diagnosis Date  . Hypertension   . Asthma   . Migraines 2011  . Allergy   . Anemia    OB History   Grav Para Term Preterm Abortions TAB SAB Ect Mult Living   1    1 1          # Outc Date GA Lbr Len/2nd Wgt Sex Del Anes PTL Lv   1 TAB 1998             Past Surgical History  Procedure Laterality Date  . Cholecystectomy  2006 or 2007  . Dilation and curettage of uterus      ABORTION   History   Social History  . Marital Status: Single    Spouse Name: N/A    Number of Children: N/A  . Years of Education: N/A   Occupational History  . Not on file.   Social History Main Topics  . Smoking status: Never Smoker   . Smokeless tobacco: Not on file  . Alcohol Use: 0.0 oz/week    0 Glasses of wine per week     Comment: rarely  . Drug Use: No  . Sexually Active: Yes    Birth Control/ Protection: Condom   Other Topics Concern  . Not on file   Social History Narrative  . No narrative on file   No current facility-administered medications on file prior to encounter.   Current Outpatient Prescriptions on File Prior to Encounter  Medication Sig Dispense Refill  . acetaminophen (TYLENOL) 500 MG tablet Take 1,000 mg by mouth once.      Marland Kitchen albuterol (PROVENTIL HFA;VENTOLIN HFA) 108 (90 BASE) MCG/ACT inhaler Inhale 2 puffs into the lungs every 4 (four) hours as needed (asthma).      Marland Kitchen azithromycin (ZITHROMAX) 250 MG tablet 2 po on day 1, then 1 po daily x 4 days  6 tablet  0  . budesonide-formoterol (SYMBICORT) 160-4.5 MCG/ACT inhaler Inhale 2 puffs into the lungs 2 (two) times daily.  1 Inhaler  3  .  cholecalciferol (VITAMIN D) 1000 UNITS tablet Take 2,000 Units by mouth daily.      Marland Kitchen guaiFENesin-codeine (ROBITUSSIN AC) 100-10 MG/5ML syrup Take 5 mLs by mouth 3 (three) times daily as needed for cough or congestion.  120 mL  0  . HYDROcodone-acetaminophen (NORCO/VICODIN) 5-325 MG per tablet Take 1 tablet by mouth every 6 (six) hours as needed for pain.  15 tablet  0  . ibuprofen (ADVIL,MOTRIN) 200 MG tablet Take 800 mg by mouth every 6 (six) hours as needed. For pain.      Marland Kitchen lisinopril-hydrochlorothiazide (PRINZIDE,ZESTORETIC) 10-12.5 MG per tablet Take 1 tablet by mouth daily.      . megestrol (MEGACE) 40 MG tablet Take 1 tablet (40 mg total) by mouth daily. Take 3 PO for 3 days. Then 2 PO for 2 days. Then 1 PO for up to one week as needed for bleeding  21 tablet  0  . Norethin Ace-Eth Estrad-FE (MINASTRIN 24 FE PO) Take by mouth.      . Norethindrone-Ethinyl Estradiol-Fe Biphas (LO LOESTRIN FE) 1 MG-10 MCG / 10 MCG tablet Take 1  tablet by mouth daily.       No Known Allergies  ROS: Positive for mild cramping, dysuria, frequency. Negative for fever, chills, flank pain, GI complaints, vaginal discharge or dyspareunia.  OBJECTIVE Blood pressure 136/82, pulse 99, temperature 98 F (36.7 C), temperature source Oral, resp. rate 20, height 5\' 6"  (1.676 m), weight 297 lb (134.718 kg), last menstrual period 11/09/2012, SpO2 99.00%. GENERAL: Well-developed, well-nourished female in no acute distress.  HEENT: Normocephalic HEART: normal rate RESP: normal effort ABDOMEN: Soft, non-tender. No CVA tenderness. EXTREMITIES: Nontender, no edema NEURO: Alert and oriented SPECULUM EXAM: NEFG except for 45-10 mm hemostatic, superficial lacerations around the introitus. Physiologic discharge, no active bleeding noted, cervix clean BIMANUAL: cervix closed; uterus normal size, no adnexal tenderness or masses  LAB RESULTS URINALYSIS, ROUTINE W REFLEX MICROSCOPIC   Collection Time    11/27/12  7:50 PM       Result Value Range   Color, Urine YELLOW  YELLOW   APPearance CLEAR  CLEAR   Specific Gravity, Urine 1.025  1.005 - 1.030   pH 6.0  5.0 - 8.0   Glucose, UA NEGATIVE  NEGATIVE mg/dL   Hgb urine dipstick SMALL (*) NEGATIVE   Bilirubin Urine NEGATIVE  NEGATIVE   Ketones, ur NEGATIVE  NEGATIVE mg/dL   Protein, ur NEGATIVE  NEGATIVE mg/dL   Urobilinogen, UA 1.0  0.0 - 1.0 mg/dL   Nitrite NEGATIVE  NEGATIVE   Leukocytes, UA MODERATE (*) NEGATIVE  URINE MICROSCOPIC-ADD ON   Collection Time    11/27/12  7:50 PM      Result Value Range   Squamous Epithelial / LPF FEW (*) RARE   WBC, UA 11-20  <3 WBC/hpf   RBC / HPF 3-6  <3 RBC/hpf   Bacteria, UA FEW (*) RARE  POCT PREGNANCY, URINE   Collection Time    11/27/12  8:14 PM      Result Value Range   Preg Test, Ur NEGATIVE  NEGATIVE  WET PREP, GENITAL   Collection Time    11/27/12  9:26 PM      Result Value Range   Yeast Wet Prep HPF POC FEW (*) NONE SEEN   Trich, Wet Prep NONE SEEN  NONE SEEN   Clue Cells Wet Prep HPF POC NONE SEEN  NONE SEEN   WBC, Wet Prep HPF POC MODERATE (*) NONE SEEN     IMAGING No results found.  MAU COURSE Pain relieved with lidocaine gel.  ASSESSMENT 1. Vaginal laceration, initial encounter   2. UTI (lower urinary tract infection)    PLAN Discharge home in stable condition. No intercourse x1 week. Recommend water based lubricant with intercourse. Increase fluids.    Medication List         albuterol 108 (90 BASE) MCG/ACT inhaler  Commonly known as:  PROVENTIL HFA;VENTOLIN HFA  Inhale 2 puffs into the lungs every 4 (four) hours as needed (asthma).     budesonide-formoterol 160-4.5 MCG/ACT inhaler  Commonly known as:  SYMBICORT  Inhale 2 puffs into the lungs 2 (two) times daily as needed (for wheezing).     ciprofloxacin 500 MG tablet  Commonly known as:  CIPRO  Take 1 tablet (500 mg total) by mouth 2 (two) times daily.     HYDROcodone-acetaminophen 5-325 MG per tablet  Commonly known as:   NORCO/VICODIN  Take 1 tablet by mouth every 6 (six) hours as needed for pain.     ibuprofen 200 MG tablet  Commonly known as:  ADVIL,MOTRIN  Take 800 mg by mouth every 6 (six) hours as needed for pain.     lisinopril-hydrochlorothiazide 10-12.5 MG per tablet  Commonly known as:  PRINZIDE,ZESTORETIC  Take 1 tablet by mouth daily.     LO LOESTRIN FE 1 MG-10 MCG / 10 MCG tablet  Generic drug:  Norethindrone-Ethinyl Estradiol-Fe Biphas  Take 1 tablet by mouth daily.     RELPAX PO  Take 1 tablet by mouth daily as needed (for migraines.).       [place], [place] 11/27/2012  8:37 PM

## 2012-11-27 NOTE — MAU Note (Signed)
Pt presents with complaint of vaginal tear after intercourse. States immediately afterward she had a lot of pain and burning. Some burning with urination. Lower abd pain.

## 2012-11-29 LAB — URINE CULTURE: Colony Count: 40000

## 2013-01-13 ENCOUNTER — Telehealth: Payer: Self-pay | Admitting: Family Medicine

## 2013-01-13 NOTE — Telephone Encounter (Signed)
Mary Hanson  With [phone] Needs to let nurse know she has been touch with [name]. Seems to be doing well Any questions, please call

## 2013-02-01 ENCOUNTER — Encounter (HOSPITAL_COMMUNITY): Payer: Self-pay | Admitting: Emergency Medicine

## 2013-02-01 ENCOUNTER — Emergency Department (HOSPITAL_COMMUNITY)
Admission: EM | Admit: 2013-02-01 | Discharge: 2013-02-01 | Disposition: A | Discharge status: Home / Self Care | Payer: BC Managed Care – PPO | Attending: Emergency Medicine | Admitting: Emergency Medicine

## 2013-02-01 DIAGNOSIS — H538 Other visual disturbances: Secondary | ICD-10-CM | POA: Insufficient documentation

## 2013-02-01 DIAGNOSIS — J45909 Unspecified asthma, uncomplicated: Secondary | ICD-10-CM | POA: Insufficient documentation

## 2013-02-01 DIAGNOSIS — G43909 Migraine, unspecified, not intractable, without status migrainosus: Secondary | ICD-10-CM

## 2013-02-01 DIAGNOSIS — I10 Essential (primary) hypertension: Secondary | ICD-10-CM | POA: Insufficient documentation

## 2013-02-01 DIAGNOSIS — Z79899 Other long term (current) drug therapy: Secondary | ICD-10-CM | POA: Insufficient documentation

## 2013-02-01 DIAGNOSIS — R52 Pain, unspecified: Secondary | ICD-10-CM | POA: Insufficient documentation

## 2013-02-01 DIAGNOSIS — Z862 Personal history of diseases of the blood and blood-forming organs and certain disorders involving the immune mechanism: Secondary | ICD-10-CM | POA: Insufficient documentation

## 2013-02-01 DIAGNOSIS — IMO0002 Reserved for concepts with insufficient information to code with codable children: Secondary | ICD-10-CM | POA: Insufficient documentation

## 2013-02-01 DIAGNOSIS — R42 Dizziness and giddiness: Secondary | ICD-10-CM | POA: Insufficient documentation

## 2013-02-01 MED ORDER — SODIUM CHLORIDE 0.9 % IV BOLUS (SEPSIS)
1000.0000 mL | Freq: Once | INTRAVENOUS | Status: AC
  Administered 2013-02-01: 1000 mL via INTRAVENOUS

## 2013-02-01 MED ORDER — DIPHENHYDRAMINE HCL 50 MG/ML IJ SOLN
25.0000 mg | Freq: Once | INTRAMUSCULAR | Status: AC
  Administered 2013-02-01: 25 mg via INTRAVENOUS
  Filled 2013-02-01: qty 1

## 2013-02-01 MED ORDER — METOCLOPRAMIDE HCL 5 MG/ML IJ SOLN
5.0000 mg | Freq: Once | INTRAMUSCULAR | Status: AC
  Administered 2013-02-01: 5 mg via INTRAVENOUS
  Filled 2013-02-01: qty 2

## 2013-02-01 MED ORDER — KETOROLAC TROMETHAMINE 30 MG/ML IJ SOLN
30.0000 mg | Freq: Once | INTRAMUSCULAR | Status: AC
  Administered 2013-02-01: 30 mg via INTRAVENOUS
  Filled 2013-02-01: qty 1

## 2013-02-01 NOTE — ED Provider Notes (Signed)
CSN: [id number]     Arrival date & time 02/01/13  4098 History   First MD Initiated Contact with Patient 02/01/13 [phone]     Chief Complaint  Patient presents with  . Generalized Body Aches  . Headache   (Consider location/radiation/quality/duration/timing/severity/associated sxs/prior Treatment) HPI  This is a 37 year old female with a history of hypertension, asthma, and migraines who presents with headache and generalized bodyaches. The patient states she received a flu shot Wednesday. Since that time she has had daily headache. She reports that it is frontal and similar to her past migraines.  Rates pain at 10/10.  She's been taking 800 mg of ibuprofen regularly with only minor relief of her headache. She denies any double vision or weakness or numbness. Patient does report several episodes of blurry vision.  She denies any fevers.    Past Medical History  Diagnosis Date  . Hypertension   . Asthma   . Migraines 2011  . Allergy   . Anemia     02/01/13 - Pt states she is not anemic anymore  . Abnormal Pap smear 37 yr old   Past Surgical History  Procedure Laterality Date  . Cholecystectomy  2006 or 2007  . Dilation and curettage of uterus      ABORTION  . Cervical cone biopsy  37 yrs old   Family History  Problem Relation Age of Onset  . Diabetes type II Father   . Hypertension Father   . Hypertension Mother   . Goiter Sister 15    identical twin  . Asthma Sister   . Hypertension Sister   . Stroke Maternal Grandfather   . Diabetes Paternal Grandfather   . Heart disease Paternal Grandfather    History  Substance Use Topics  . Smoking status: Never Smoker   . Smokeless tobacco: Never Used  . Alcohol Use: No   OB History   Grav Para Term Preterm Abortions TAB SAB Ect Mult Living   1    1 1          Review of Systems  Constitutional: Negative for fever.  Respiratory: Negative for cough, chest tightness and shortness of breath.   Cardiovascular: Negative for chest  pain.  Gastrointestinal: Negative for nausea, vomiting and abdominal pain.  Genitourinary: Negative for dysuria.  Musculoskeletal: Positive for myalgias.  Neurological: Positive for dizziness and headaches. Negative for syncope.  All other systems reviewed and are negative.    Allergies  Ivp dye  Home Medications   Current Outpatient Rx  Name  Route  Sig  Dispense  Refill  . albuterol (PROVENTIL HFA;VENTOLIN HFA) 108 (90 BASE) MCG/ACT inhaler   Inhalation   Inhale 2 puffs into the lungs every 4 (four) hours as needed (asthma).         . budesonide-formoterol (SYMBICORT) 160-4.5 MCG/ACT inhaler   Inhalation   Inhale 2 puffs into the lungs 2 (two) times daily as needed (for wheezing).         Marland Kitchen ibuprofen (ADVIL,MOTRIN) 800 MG tablet   Oral   Take 800 mg by mouth every 8 (eight) hours as needed for pain.         Marland Kitchen lisinopril-hydrochlorothiazide (PRINZIDE,ZESTORETIC) 10-12.5 MG per tablet   Oral   Take 1 tablet by mouth daily.         . Norethindrone-Ethinyl Estradiol-Fe Biphas (LO LOESTRIN FE) 1 MG-10 MCG / 10 MCG tablet   Oral   Take 1 tablet by mouth daily.  BP 107/65  Pulse 69  Temp(Src) 97.9 F (36.6 C) (Oral)  Resp 18  Ht 5\' 6"  (1.676 m)  Wt 300 lb (136.079 kg)  BMI 48.44 kg/m2  SpO2 100%  LMP 01/29/2013 Physical Exam  Nursing note and vitals reviewed. Constitutional: She is oriented to person, place, and time. She appears well-developed and well-nourished.  HENT:  Head: Normocephalic and atraumatic.  Mouth/Throat: Oropharynx is clear and moist.  Eyes: EOM are normal. Pupils are equal, round, and reactive to light.  Neck: Neck supple.  Cardiovascular: Normal rate, regular rhythm and normal heart sounds.   No murmur heard. Pulmonary/Chest: Effort normal and breath sounds normal. No respiratory distress. She has no wheezes.  Abdominal: Soft. Bowel sounds are normal. There is no tenderness.  Neurological: She is alert and oriented to person,  place, and time. No cranial nerve deficit.  5 out of 5 strength in all 4 extremities, visual fields intact  Skin: Skin is warm and dry.  Psychiatric: She has a normal mood and affect.    ED Course  Procedures (including critical care time) Labs Review Labs Reviewed - No data to display Imaging Review No results found.  EKG Interpretation   None      Medications  ketorolac (TORADOL) 30 MG/ML injection 30 mg (30 mg Intravenous Given 02/01/13 0752)  sodium chloride 0.9 % bolus 1,000 mL (1,000 mLs Intravenous New Bag/Given 02/01/13 0751)  metoCLOPramide (REGLAN) injection 5 mg (5 mg Intravenous Given 02/01/13 0752)  diphenhydrAMINE (BENADRYL) injection 25 mg (25 mg Intravenous Given 02/01/13 0751)    MDM   1. Migraine    This is a 37 year old female who presents with headache and generalized bodyaches. Patient reports onset of symptoms after getting the flu shot on Wednesday. She denies any fevers or other symptoms. She is nontoxic-appearing on exam and her vital signs are within normal limits. Neurologic exam is within normal limits. Patient has a history of migraines and states that this headache is similar. She was given Toradol, Reglan, and Benadryl. Patient had improvement of her symptoms while in the emergency department. I have low clinical suspicion that the patient's headache was related to the flu shot and is likely just coincidental. Given her history of migraines and similar prior headaches, likely migraine. Patient will be discharged home with primary care followup.  After history, exam, and medical workup I feel the patient has been appropriately medically screened and is safe for discharge home. Pertinent diagnoses were discussed with the patient. Patient was given return precautions.    Shon Baton, MD 02/01/13 [phone]

## 2013-02-01 NOTE — ED Notes (Signed)
Pt states that she has felt achy with generalized body aches and HA since she received the flu shot on Wed.

## 2013-02-01 NOTE — ED Notes (Signed)
Per pt received flu shot on Wednesday and reports having h/a since then starting 15-20 minutes after shot administered. Pt reports generalized body aches. Upon assessment, no swelling noted around injection site on right deltoid. Pt denies any other complaints at present time.

## 2013-02-05 ENCOUNTER — Other Ambulatory Visit: Payer: Self-pay | Admitting: Family Medicine

## 2013-02-07 ENCOUNTER — Encounter (HOSPITAL_COMMUNITY): Payer: Self-pay | Admitting: Emergency Medicine

## 2013-02-07 ENCOUNTER — Emergency Department (HOSPITAL_COMMUNITY)
Admission: EM | Admit: 2013-02-07 | Discharge: 2013-02-07 | Disposition: A | Discharge status: Home / Self Care | Payer: Self-pay | Attending: Emergency Medicine | Admitting: Emergency Medicine

## 2013-02-07 ENCOUNTER — Emergency Department (HOSPITAL_COMMUNITY): Payer: BC Managed Care – PPO

## 2013-02-07 DIAGNOSIS — R51 Headache: Secondary | ICD-10-CM | POA: Insufficient documentation

## 2013-02-07 DIAGNOSIS — Z862 Personal history of diseases of the blood and blood-forming organs and certain disorders involving the immune mechanism: Secondary | ICD-10-CM | POA: Insufficient documentation

## 2013-02-07 DIAGNOSIS — R05 Cough: Secondary | ICD-10-CM | POA: Insufficient documentation

## 2013-02-07 DIAGNOSIS — Z79899 Other long term (current) drug therapy: Secondary | ICD-10-CM | POA: Insufficient documentation

## 2013-02-07 DIAGNOSIS — J029 Acute pharyngitis, unspecified: Secondary | ICD-10-CM | POA: Insufficient documentation

## 2013-02-07 DIAGNOSIS — R519 Headache, unspecified: Secondary | ICD-10-CM

## 2013-02-07 DIAGNOSIS — J3489 Other specified disorders of nose and nasal sinuses: Secondary | ICD-10-CM | POA: Insufficient documentation

## 2013-02-07 DIAGNOSIS — R059 Cough, unspecified: Secondary | ICD-10-CM | POA: Insufficient documentation

## 2013-02-07 DIAGNOSIS — I10 Essential (primary) hypertension: Secondary | ICD-10-CM | POA: Insufficient documentation

## 2013-02-07 DIAGNOSIS — J45909 Unspecified asthma, uncomplicated: Secondary | ICD-10-CM | POA: Insufficient documentation

## 2013-02-07 MED ORDER — AMOXICILLIN 500 MG PO CAPS: 500.0000 mg | ORAL_CAPSULE | Freq: Three times a day (TID) | ORAL | Status: DC

## 2013-02-07 MED ORDER — KETOROLAC TROMETHAMINE 30 MG/ML IJ SOLN
30.0000 mg | Freq: Once | INTRAMUSCULAR | Status: AC
  Administered 2013-02-07: 30 mg via INTRAVENOUS
  Filled 2013-02-07: qty 1

## 2013-02-07 MED ORDER — METOCLOPRAMIDE HCL 5 MG/ML IJ SOLN
10.0000 mg | Freq: Once | INTRAMUSCULAR | Status: AC
  Administered 2013-02-07: 10 mg via INTRAVENOUS
  Filled 2013-02-07: qty 2

## 2013-02-07 MED ORDER — ASPIRIN-ACETAMINOPHEN-CAFFEINE 250-250-65 MG PO TABS: 1.0000 | ORAL_TABLET | Freq: Four times a day (QID) | ORAL | Status: DC | PRN

## 2013-02-07 MED ORDER — DIPHENHYDRAMINE HCL 50 MG/ML IJ SOLN
25.0000 mg | Freq: Once | INTRAMUSCULAR | Status: AC
  Administered 2013-02-07: 25 mg via INTRAVENOUS
  Filled 2013-02-07: qty 1

## 2013-02-07 MED ORDER — SODIUM CHLORIDE 0.9 % IV BOLUS (SEPSIS)
1000.0000 mL | Freq: Once | INTRAVENOUS | Status: AC
  Administered 2013-02-07: 1000 mL via INTRAVENOUS

## 2013-02-07 NOTE — ED Provider Notes (Signed)
CSN: [id number]     Arrival date & time 02/07/13  0551 History   First MD Initiated Contact with Patient 02/07/13 0602     No chief complaint on file.  (Consider location/radiation/quality/duration/timing/severity/associated sxs/prior Treatment) HPI Comments: Patient presents to the ED with a chief complaint of migraine.  Patient states that she has had a headache x 9 days.  She states that she was seen here on Saturday, and was given a migraine cocktail with some relief.  She states that Saturday night her headache returned.  She states that it is waxing and waning in nature.  It is not thunderclap in origin.  She states that she has never had a headache last this long.  She denies fevers and neck tenderness.  She endorses runny/stuff nose and sore throat and cough.  She has tried taking her migraine medication and vicodin with some relief.   The history is provided by the patient. No language interpreter was used.    Past Medical History  Diagnosis Date  . Hypertension   . Asthma   . Migraines 2011  . Allergy   . Anemia     02/01/13 - Pt states she is not anemic anymore  . Abnormal Pap smear 37 yr old   Past Surgical History  Procedure Laterality Date  . Cholecystectomy  2006 or 2007  . Dilation and curettage of uterus      ABORTION  . Cervical cone biopsy  37 yrs old   Family History  Problem Relation Age of Onset  . Diabetes type II Father   . Hypertension Father   . Hypertension Mother   . Goiter Sister 44    identical twin  . Asthma Sister   . Hypertension Sister   . Stroke Maternal Grandfather   . Diabetes Paternal Grandfather   . Heart disease Paternal Grandfather    History  Substance Use Topics  . Smoking status: Never Smoker   . Smokeless tobacco: Never Used  . Alcohol Use: No   OB History   Grav Para Term Preterm Abortions TAB SAB Ect Mult Living   1    1 1          Review of Systems  All other systems reviewed and are negative.    Allergies  Ivp  dye  Home Medications   Current Outpatient Rx  Name  Route  Sig  Dispense  Refill  . albuterol (PROVENTIL HFA;VENTOLIN HFA) 108 (90 BASE) MCG/ACT inhaler   Inhalation   Inhale 2 puffs into the lungs every 4 (four) hours as needed (asthma).         . ibuprofen (ADVIL,MOTRIN) 800 MG tablet   Oral   Take 800 mg by mouth every 8 (eight) hours as needed for pain.         Marland Kitchen lisinopril-hydrochlorothiazide (PRINZIDE,ZESTORETIC) 10-12.5 MG per tablet   Oral   Take 1 tablet by mouth daily.         . Norethindrone-Ethinyl Estradiol-Fe Biphas (LO LOESTRIN FE) 1 MG-10 MCG / 10 MCG tablet   Oral   Take 1 tablet by mouth daily.          BP 135/105  Pulse 75  Temp(Src) 97.9 F (36.6 C) (Oral)  Ht 5\' 6"  (1.676 m)  Wt 300 lb (136.079 kg)  BMI 48.44 kg/m2  SpO2 99%  LMP 01/29/2013 Physical Exam  Nursing note and vitals reviewed. Constitutional: She is oriented to person, place, and time. She appears well-developed and well-nourished.  HENT:  Head: Normocephalic and atraumatic.  Right Ear: External ear normal.  Left Ear: External ear normal.  Non-tender over temporal artery, no increased pain with chewing Frontal and maxillary sinuses are moderately tender to palpation Bilateral TMs are clear Oropharynx is clear and without exudate or abscess  Eyes: Conjunctivae and EOM are normal. Pupils are equal, round, and reactive to light.  No papilledema  Neck: Normal range of motion. Neck supple.  No pain with neck flexion, no meningismus  Cardiovascular: Normal rate, regular rhythm and normal heart sounds.  Exam reveals no gallop and no friction rub.   No murmur heard. Pulmonary/Chest: Effort normal and breath sounds normal. No respiratory distress. She has no wheezes. She has no rales. She exhibits no tenderness.  Abdominal: Soft. Bowel sounds are normal. She exhibits no distension and no mass. There is no tenderness. There is no rebound and no guarding.  Musculoskeletal: Normal range  of motion. She exhibits no edema and no tenderness.  Normal gait.  Neurological: She is alert and oriented to person, place, and time. She has normal reflexes.  CN 3-12 intact, no pronator drift, normal shin to heel, normal RAM, sensation and strength intact bilaterally.  Skin: Skin is warm and dry.  Psychiatric: She has a normal mood and affect. Her behavior is normal. Judgment and thought content normal.    ED Course  Procedures (including critical care time) Ct Head Wo Contrast  02/07/2013   CLINICAL DATA:  Migraine for 10 days.  EXAM: CT HEAD WITHOUT CONTRAST  TECHNIQUE: Contiguous axial images were obtained from the base of the skull through the vertex without intravenous contrast.  COMPARISON:  05/24/2010  FINDINGS: No mass lesion. No midline shift. No acute hemorrhage or hematoma. No extra-axial fluid collections. No evidence of acute infarction. Calvarium is intact. Mild mucosal thickening of the ethmoid air cells is noted. The mastoid air cells are clear. Orbital soft tissues are unremarkable.  IMPRESSION: Negative examination.   Electronically Signed   By: Jerene Dilling M.D.   On: 02/07/2013 07:15      EKG Interpretation   None       MDM   1. Headache    Patient with headache x 9 days.  Patient has never had a headache last this long.  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  Will treat with migraine cocktail and order a head CT to rule out mass.    Head CT is negative.  Given that the patient improved with migraine cocktail and that the patient has some maxillary sinus pressure, will treat with amoxicillin, and refer back to PCP and/or neurology.  Pt HA treated and improved while in ED.  Presentation is like pts typical HA and non concerning for [facility], ICH, Meningitis, or temporal arteritis. Pt is afebrile with no focal neuro deficits, nuchal rigidity, or change in vision. Pt is to follow up with PCP to discuss prophylactic medication. Pt  verbalizes understanding and is agreeable with plan to dc.      Roxy Horseman, PA-C 02/07/13 [phone]

## 2013-02-07 NOTE — ED Notes (Signed)
Pt states that she has had a headache for the past 9 days with no relief, was in the ED a few days ago and got the migrain cocktail which only gave relief for hours, has been taking her medicine as prescribed but is not getting any relief.

## 2013-02-07 NOTE — ED Notes (Signed)
MD at bedside. 

## 2013-02-13 NOTE — ED Provider Notes (Signed)
Medical screening examination/treatment/procedure(s) were performed by non-physician practitioner and as supervising physician I was immediately available for consultation/collaboration.    Elijah Michaelis, MD, FACEP   Shaconda Hajduk L Lyn Deemer, MD 02/13/13 0703 

## 2013-03-26 ENCOUNTER — Encounter (HOSPITAL_COMMUNITY): Payer: Self-pay | Admitting: *Deleted

## 2013-03-26 ENCOUNTER — Inpatient Hospital Stay (HOSPITAL_COMMUNITY)
Admission: AD | Admit: 2013-03-26 | Discharge: 2013-03-26 | Disposition: A | Discharge status: Home / Self Care | Payer: Self-pay | Source: Ambulatory Visit | Attending: Obstetrics & Gynecology | Admitting: Obstetrics & Gynecology

## 2013-03-26 DIAGNOSIS — N946 Dysmenorrhea, unspecified: Secondary | ICD-10-CM | POA: Insufficient documentation

## 2013-03-26 DIAGNOSIS — N949 Unspecified condition associated with female genital organs and menstrual cycle: Secondary | ICD-10-CM | POA: Insufficient documentation

## 2013-03-26 DIAGNOSIS — R109 Unspecified abdominal pain: Secondary | ICD-10-CM | POA: Insufficient documentation

## 2013-03-26 LAB — CBC
HCT: 36.5 % (ref 36.0–46.0)
MCV: 85.7 fL (ref 78.0–100.0)
RBC: 4.26 MIL/uL (ref 3.87–5.11)
WBC: 10.6 10*3/uL — ABNORMAL HIGH (ref 4.0–10.5)

## 2013-03-26 LAB — URINALYSIS, ROUTINE W REFLEX MICROSCOPIC
Glucose, UA: NEGATIVE mg/dL
Specific Gravity, Urine: 1.025 (ref 1.005–1.030)

## 2013-03-26 LAB — URINE MICROSCOPIC-ADD ON

## 2013-03-26 MED ORDER — OXYCODONE-ACETAMINOPHEN 5-325 MG PO TABS: 2.0000 | ORAL_TABLET | ORAL | Status: DC | PRN

## 2013-03-26 MED ORDER — KETOROLAC TROMETHAMINE 60 MG/2ML IM SOLN
60.0000 mg | Freq: Once | INTRAMUSCULAR | Status: AC
  Administered 2013-03-26: 60 mg via INTRAMUSCULAR
  Filled 2013-03-26: qty 2

## 2013-03-26 MED ORDER — GI COCKTAIL ~~LOC~~
30.0000 mL | Freq: Once | ORAL | Status: AC
  Administered 2013-03-26: 30 mL via ORAL
  Filled 2013-03-26: qty 30

## 2013-03-26 MED ORDER — PROMETHAZINE HCL 25 MG PO TABS: 25.0000 mg | ORAL_TABLET | Freq: Four times a day (QID) | ORAL | Status: DC | PRN

## 2013-03-26 NOTE — MAU Provider Note (Signed)
History     CSN: [id number]  Arrival date and time: 03/26/13 1556   None     No chief complaint on file.  HPI Comments: Mary Hanson 37 y.o. G1P0010 presents to MAU with pelvic pain with menses. She has BTB and menses cramps. She is taking Motrin 800 mg TID and it is upsetting her stomach. She has been given megace in past for this same bleeding and still has some at home.       Past Medical History  Diagnosis Date  . Hypertension   . Asthma   . Migraines 2011  . Allergy   . Anemia     02/01/13 - Pt states she is not anemic anymore  . Abnormal Pap smear 37 yr old    Past Surgical History  Procedure Laterality Date  . Cholecystectomy  2006 or 2007  . Dilation and curettage of uterus      ABORTION  . Cervical cone biopsy  37 yrs old    Family History  Problem Relation Age of Onset  . Diabetes type II Father   . Hypertension Father   . Hypertension Mother   . Goiter Sister 83    identical twin  . Asthma Sister   . Hypertension Sister   . Stroke Maternal Grandfather   . Diabetes Paternal Grandfather   . Heart disease Paternal Grandfather     History  Substance Use Topics  . Smoking status: Never Smoker   . Smokeless tobacco: Never Used  . Alcohol Use: No    Allergies:  Allergies  Allergen Reactions  . Ivp Dye [Iodinated Diagnostic Agents] Nausea And Vomiting    Prescriptions prior to admission  Medication Sig Dispense Refill  . albuterol (PROVENTIL HFA;VENTOLIN HFA) 108 (90 BASE) MCG/ACT inhaler Inhale 2 puffs into the lungs every 4 (four) hours as needed (asthma).      Marland Kitchen aspirin-acetaminophen-caffeine (EXCEDRIN MIGRAINE) 250-250-65 MG per tablet Take 1 tablet by mouth every 6 (six) hours as needed for pain.  30 tablet  0  . ibuprofen (ADVIL,MOTRIN) 800 MG tablet Take 800 mg by mouth every 8 (eight) hours as needed for pain.      Marland Kitchen lisinopril-hydrochlorothiazide (PRINZIDE,ZESTORETIC) 10-12.5 MG per tablet Take 1 tablet by mouth daily.      .  Norethindrone-Ethinyl Estradiol-Fe Biphas (LO LOESTRIN FE) 1 MG-10 MCG / 10 MCG tablet Take 1 tablet by mouth daily.        Review of Systems  Constitutional: Negative.   Eyes: Negative.   Respiratory: Negative.   Cardiovascular: Negative.   Gastrointestinal: Positive for nausea, abdominal pain and constipation.  Genitourinary: Negative.   Musculoskeletal: Negative.   Skin: Negative.   Neurological: Positive for headaches.  Endo/Heme/Allergies: Negative.   Psychiatric/Behavioral: Negative.    Physical Exam   Blood pressure 149/88, pulse 66, temperature 98.2 F (36.8 C), temperature source Oral, resp. rate 18, height 5\' 6"  (1.676 m), weight 137.893 kg (304 lb), SpO2 100.00%.  Physical Exam  Constitutional: She appears well-developed and well-nourished. No distress.  HENT:  Head: Normocephalic and atraumatic.  Eyes: Pupils are equal, round, and reactive to light.  Cardiovascular: Normal rate, regular rhythm and normal heart sounds.   Respiratory: Effort normal and breath sounds normal. No respiratory distress. She has no wheezes.  GI: Soft. Bowel sounds are normal. She exhibits no distension. There is tenderness.  More epigastric pain   Genitourinary: Vagina normal and uterus normal.  Small amount bleeding  Musculoskeletal: Normal range of motion.  Neurological: She  is alert. She has normal reflexes.  Skin: Skin is warm and dry.  Psychiatric: She has a normal mood and affect.    MAU Course  Procedures  MDM Toradol 60 mg IM/ pain is 95 % better GI Cocktail  Assessment and Plan   A: Dysmenorrhea  P: Above orders Home with percocet/ phenergan [place] IUD with Dr Dorise Hiss, Rubbie Battiest 03/26/2013, 5:17 PM

## 2013-04-02 NOTE — MAU Provider Note (Signed)
Attestation of Attending Supervision of Advanced Practitioner (CNM/NP): Evaluation and management procedures were performed by the Advanced Practitioner under my supervision and collaboration. I have reviewed the Advanced Practitioner's note and chart, and I agree with the management and plan.  Rilynne Lonsway H. 9:39 AM   

## 2013-04-03 ENCOUNTER — Encounter: Payer: Self-pay | Admitting: Family Medicine

## 2013-04-03 ENCOUNTER — Ambulatory Visit (INDEPENDENT_AMBULATORY_CARE_PROVIDER_SITE_OTHER): Payer: Self-pay | Admitting: Family Medicine

## 2013-04-03 VITALS — BP 121/68 | HR 94 | Temp 98.8°F | Wt 296.0 lb

## 2013-04-03 DIAGNOSIS — E049 Nontoxic goiter, unspecified: Secondary | ICD-10-CM | POA: Insufficient documentation

## 2013-04-03 DIAGNOSIS — N920 Excessive and frequent menstruation with regular cycle: Secondary | ICD-10-CM

## 2013-04-03 DIAGNOSIS — R631 Polydipsia: Secondary | ICD-10-CM

## 2013-04-03 LAB — COMPREHENSIVE METABOLIC PANEL
AST: 7 U/L (ref 0–37)
Albumin: 3.6 g/dL (ref 3.5–5.2)
Alkaline Phosphatase: 72 U/L (ref 39–117)
BUN: 11 mg/dL (ref 6–23)
Potassium: 3.8 mEq/L (ref 3.5–5.3)
Sodium: 138 mEq/L (ref 135–145)
Total Protein: 7.2 g/dL (ref 6.0–8.3)

## 2013-04-03 NOTE — Progress Notes (Signed)
   Subjective:    Patient ID: Mary Hanson, female    DOB: 04/09/1976, 37 y.o.   MRN: [id number]  HPI  Has had multiple issues with vaginal bleeding. Seen in [facility] clinics, MAU and GYN MD in [place].  On Provera, Megace and lo-estrin.  Tried regular strength OC's but BP up to much.  She is essential primip.  She does not want an IUD. Megace has worked for her.  Her cycles are regular but will not stop and she is frequently bleeding x 3 wks at a time.  She has a nml pelvic sono.  Hgb is 11.4. Reports h/o enlarged thyroid with endocrine referral and nml TSH years ago. Also c/o polydipsia.  Strong f/h of DM.  No labs since 2012.  Review of Systems  Constitutional: Negative for fever and chills.  Respiratory: Negative for shortness of breath.   Gastrointestinal: Negative for abdominal pain.  Genitourinary: Positive for menstrual problem.  Skin: Negative for rash.       Objective:   Physical Exam  Constitutional: She is oriented to person, place, and time. She appears well-developed and well-nourished. No distress.  obese  HENT:  Head: Normocephalic and atraumatic.  Eyes: No scleral icterus.  Neck: Neck supple.  Cardiovascular: Normal rate.   Pulmonary/Chest: Effort normal.  Abdominal: Soft.  Neurological: She is alert and oriented to person, place, and time.  Skin: Skin is warm and dry.          Assessment & Plan:

## 2013-04-03 NOTE — Assessment & Plan Note (Signed)
Check CMP.  ?

## 2013-04-03 NOTE — Assessment & Plan Note (Signed)
Given age and persistency, should get EMB.  Check TSH.

## 2013-04-03 NOTE — Patient Instructions (Signed)
Endometrial Biopsy Endometrial biopsy is a procedure in which a tissue sample is taken from inside the uterus. The tissue sample is then looked at under a microscope to see if the tissue is normal or abnormal. The endometrium is the lining of the uterus. This procedure helps determine where you are in your menstrual cycle and how hormone levels are affecting the lining of the uterus. This procedure may also be used to evaluate uterine bleeding or to diagnose endometrial cancer, tuberculosis, polyps, or inflammatory conditions.  LET [facility] CARE PROVIDER KNOW ABOUT:  Any allergies you have.  All medicines you are taking, including vitamins, herbs, eye drops, creams, and over-the-counter medicines.  Previous problems you or members of your family have had with the use of anesthetics.  Any blood disorders you have.  Previous surgeries you have had.  Medical conditions you have.  Possibility of pregnancy. RISKS AND COMPLICATIONS Generally, this is a safe procedure. However, as with any procedure, complications can occur. Possible complications include:  Bleeding.  Pelvic infection.  Puncture of the uterine wall with the biopsy device (rare). BEFORE THE PROCEDURE   Keep a record of your menstrual cycles as directed by your health care provider. You may need to schedule your procedure for a specific time in your cycle.  You may want to bring a sanitary pad to wear home after the procedure.  Arrange for someone to drive you home after the procedure if you will be given a medicine to help you relax (sedative). PROCEDURE   You may be given a sedative to relax you.  You will lie on an exam table with your feet and legs supported as in a pelvic exam.  Your health care provider will insert an instrument (speculum) into your vagina to see your cervix.  Your cervix will be cleansed with an antiseptic solution. A medicine (local anesthetic) will be used to numb the cervix.  A forceps  instrument (tenaculum) will be used to hold your cervix steady for the biopsy.  A thin, rodlike instrument (uterine sound) will be inserted through your cervix to determine the length of your uterus and the location where the biopsy sample will be removed.  A thin, flexible tube (catheter) will be inserted through your cervix and into the uterus. The catheter is used to collect the biopsy sample from your endometrial tissue.  The catheter and speculum will then be removed, and the tissue sample will be sent to a lab for examination. AFTER THE PROCEDURE  You will rest in a recovery area until you are ready to go home.  You may have mild cramping and a small amount of vaginal bleeding for a few days after the procedure. This is normal.  Make sure you find out how to get your test results. Document Released: 08/11/2004 Document Revised: 12/11/2012 Document Reviewed: 09/25/2012 [facility] Patient Information 2014 [place], [place]. Menorrhagia Dysfunctional uterine bleeding is different from a normal menstrual period. When periods are heavy or there is more bleeding than is usual for you, it is called menorrhagia. It may be caused by hormonal imbalance, or physical, metabolic, or other problems. Examination is necessary in order that your caregiver may treat treatable causes. If this is a continuing problem, a D&C may be needed. That means that the cervix (the opening of the uterus or womb) is dilated (stretched larger) and the lining of the uterus is scraped out. The tissue scraped out is then examined under a microscope by a specialist (pathologist) to make sure  there is nothing of concern that needs further or more extensive treatment. HOME CARE INSTRUCTIONS   If medications were prescribed, take exactly as directed. Do not change or switch medications without consulting your caregiver.  Long term heavy bleeding may result in iron deficiency. Your caregiver may have prescribed iron pills. They help  replace the iron your body lost from heavy bleeding. Take exactly as directed. Iron may cause constipation. If this becomes a problem, increase the bran, fruits, and roughage in your diet.  Do not take aspirin or medicines that contain aspirin one week before or during your menstrual period. Aspirin may make the bleeding worse.  If you need to change your sanitary pad or tampon more than once every 2 hours, stay in bed and rest as much as possible until the bleeding stops.  Eat well-balanced meals. Eat foods high in iron. Examples are leafy green vegetables, meat, liver, eggs, and whole grain breads and cereals. Do not try to lose weight until the abnormal bleeding has stopped and your blood iron level is back to normal. SEEK MEDICAL CARE IF:   You need to change your sanitary pad or tampon more than once an hour.  You develop nausea (feeling sick to your stomach) and vomiting, dizziness, or diarrhea while you are taking your medicine.  You have any problems that may be related to the medicine you are taking. SEEK IMMEDIATE MEDICAL CARE IF:   You have a fever.  You develop chills.  You develop severe bleeding or start to pass blood clots.  You feel dizzy or faint. MAKE SURE YOU:   Understand these instructions.  Will watch your condition.  Will get help right away if you are not doing well or get worse. Document Released: 04/10/2005 Document Revised: 07/03/2011 Document Reviewed: 09/29/2012 [facility] Patient Information 2014 [place], [place].

## 2013-04-04 LAB — TSH: TSH: 1.679 u[IU]/mL (ref 0.350–4.500)

## 2013-04-09 ENCOUNTER — Encounter: Payer: Self-pay | Admitting: Family Medicine

## 2013-04-09 ENCOUNTER — Ambulatory Visit (INDEPENDENT_AMBULATORY_CARE_PROVIDER_SITE_OTHER): Payer: Self-pay | Admitting: Family Medicine

## 2013-04-09 ENCOUNTER — Other Ambulatory Visit (HOSPITAL_COMMUNITY)
Admission: RE | Admit: 2013-04-09 | Discharge: 2013-04-09 | Disposition: A | Discharge status: Home / Self Care | Payer: Self-pay | Source: Ambulatory Visit | Attending: Family Medicine | Admitting: Family Medicine

## 2013-04-09 VITALS — BP 135/84 | HR 72 | Temp 98.7°F | Wt 298.0 lb

## 2013-04-09 DIAGNOSIS — N920 Excessive and frequent menstruation with regular cycle: Secondary | ICD-10-CM

## 2013-04-09 MED ORDER — MEGESTROL ACETATE 40 MG PO TABS: 40.0000 mg | ORAL_TABLET | Freq: Two times a day (BID) | ORAL | Status: DC

## 2013-04-09 NOTE — Assessment & Plan Note (Addendum)
Endometrial sampling and refilled Megace in case she needs it.

## 2013-04-09 NOTE — Patient Instructions (Signed)
Endometrial Biopsy Endometrial biopsy is a procedure in which a tissue sample is taken from inside the uterus. The tissue sample is then looked at under a microscope to see if the tissue is normal or abnormal. The endometrium is the lining of the uterus. This procedure helps determine where you are in your menstrual cycle and how hormone levels are affecting the lining of the uterus. This procedure may also be used to evaluate uterine bleeding or to diagnose endometrial cancer, tuberculosis, polyps, or inflammatory conditions.  LET YOUR HEALTH CARE PROVIDER KNOW ABOUT:  Any allergies you have.  All medicines you are taking, including vitamins, herbs, eye drops, creams, and over-the-counter medicines.  Previous problems you or members of your family have had with the use of anesthetics.  Any blood disorders you have.  Previous surgeries you have had.  Medical conditions you have.  Possibility of pregnancy. RISKS AND COMPLICATIONS Generally, this is a safe procedure. However, as with any procedure, complications can occur. Possible complications include:  Bleeding.  Pelvic infection.  Puncture of the uterine wall with the biopsy device (rare). BEFORE THE PROCEDURE   Keep a record of your menstrual cycles as directed by your health care provider. You may need to schedule your procedure for a specific time in your cycle.  You may want to bring a sanitary pad to wear home after the procedure.  Arrange for someone to drive you home after the procedure if you will be given a medicine to help you relax (sedative). PROCEDURE   You may be given a sedative to relax you.  You will lie on an exam table with your feet and legs supported as in a pelvic exam.  Your health care provider will insert an instrument (speculum) into your vagina to see your cervix.  Your cervix will be cleansed with an antiseptic solution. A medicine (local anesthetic) will be used to numb the cervix.  A forceps  instrument (tenaculum) will be used to hold your cervix steady for the biopsy.  A thin, rodlike instrument (uterine sound) will be inserted through your cervix to determine the length of your uterus and the location where the biopsy sample will be removed.  A thin, flexible tube (catheter) will be inserted through your cervix and into the uterus. The catheter is used to collect the biopsy sample from your endometrial tissue.  The catheter and speculum will then be removed, and the tissue sample will be sent to a lab for examination. AFTER THE PROCEDURE  You will rest in a recovery area until you are ready to go home.  You may have mild cramping and a small amount of vaginal bleeding for a few days after the procedure. This is normal.  Make sure you find out how to get your test results. Document Released: 08/11/2004 Document Revised: 12/11/2012 Document Reviewed: 09/25/2012 [employer] Patient Information 2014 [employer].  

## 2013-04-09 NOTE — Progress Notes (Signed)
Patient ID: Mary Hanson, female   DOB: 10-16-1975, 37 y.o.   MRN: [id number]  Here for EMB.  Has had multiple episodes of unexplained heavy vaginal bleeding. Is morbidly obese.  Is 37 years old.  Needs endometrial sampling.  Procedure: Patient given informed consent, signed copy in the chart, time out was performed. Appropriate time out taken. . The patient was placed in the lithotomy position and the cervix brought into view with sterile speculum.  Portio of cervix cleansed x 2 with betadine swabs.  A tenaculum was placed in the anterior lip of the cervix.  The uterus was sounded for depth of 8 cm. A pipelle was introduced to into the uterus, suction created,  and an endometrial sample was obtained. All equipment was removed and accounted for.  The patient tolerated the procedure well.

## 2013-04-18 ENCOUNTER — Encounter (HOSPITAL_BASED_OUTPATIENT_CLINIC_OR_DEPARTMENT_OTHER): Payer: Self-pay | Admitting: Emergency Medicine

## 2013-04-18 ENCOUNTER — Emergency Department (HOSPITAL_BASED_OUTPATIENT_CLINIC_OR_DEPARTMENT_OTHER)
Admission: EM | Admit: 2013-04-18 | Discharge: 2013-04-18 | Disposition: A | Discharge status: Home / Self Care | Payer: Self-pay | Attending: Emergency Medicine | Admitting: Emergency Medicine

## 2013-04-18 DIAGNOSIS — G43909 Migraine, unspecified, not intractable, without status migrainosus: Secondary | ICD-10-CM | POA: Insufficient documentation

## 2013-04-18 DIAGNOSIS — M542 Cervicalgia: Secondary | ICD-10-CM | POA: Insufficient documentation

## 2013-04-18 DIAGNOSIS — I10 Essential (primary) hypertension: Secondary | ICD-10-CM | POA: Insufficient documentation

## 2013-04-18 DIAGNOSIS — R52 Pain, unspecified: Secondary | ICD-10-CM | POA: Insufficient documentation

## 2013-04-18 DIAGNOSIS — M62838 Other muscle spasm: Secondary | ICD-10-CM | POA: Insufficient documentation

## 2013-04-18 DIAGNOSIS — Z79899 Other long term (current) drug therapy: Secondary | ICD-10-CM | POA: Insufficient documentation

## 2013-04-18 DIAGNOSIS — J45909 Unspecified asthma, uncomplicated: Secondary | ICD-10-CM | POA: Insufficient documentation

## 2013-04-18 MED ORDER — HYDROCODONE-ACETAMINOPHEN 5-325 MG PO TABS: 1.0000 | ORAL_TABLET | Freq: Four times a day (QID) | ORAL | Status: DC | PRN

## 2013-04-18 MED ORDER — METHOCARBAMOL 500 MG PO TABS: 500.0000 mg | ORAL_TABLET | Freq: Two times a day (BID) | ORAL | Status: DC

## 2013-04-18 NOTE — ED Notes (Signed)
Pt c/o URI symptoms x 2 days 

## 2013-04-18 NOTE — ED Provider Notes (Signed)
CSN: [id number]     Arrival date & time 04/18/13  2013 History  This chart was scribed for Sanam Marmo Smitty Cords, MD by Carl Best, ED Scribe. This patient was seen in room MH04/MH04 and the patient's care was started at 11:07 PM.     Chief Complaint  Patient presents with  . URI    Patient is a 37 y.o. female presenting with URI. The history is provided by the patient. No language interpreter was used.  URI Presenting symptoms: congestion   Presenting symptoms: no ear pain, no fever and no rhinorrhea   Congestion:    Location:  Nasal   Interferes with sleep: no     Interferes with eating/drinking: no   Severity:  Mild Onset quality:  Gradual Duration:  1 day Timing:  Constant Progression:  Unchanged Chronicity:  New Relieved by:  Nothing Worsened by:  Nothing tried Associated symptoms: neck pain (lateral left)   Associated symptoms: no swollen glands   Risk factors: not elderly    HPI Comments: Mary Hanson is a 37 y.o. female who presents to the Emergency Department complaining of constant neck pain radiating to her shoulders bilaterally and generalized body aches that started yesterday.  The patient states that she took Ibuprofen, Vicodin, and a muscle relaxer for her symptoms with mild relief to her pain.  She lists congestion as an associated symptom.  She denies rhinorrhea as an associated symptom.    Past Medical History  Diagnosis Date  . Hypertension   . Asthma   . Migraines 2011  . Allergy   . Anemia     02/01/13 - Pt states she is not anemic anymore  . Abnormal Pap smear 38 yr old   Past Surgical History  Procedure Laterality Date  . Cholecystectomy  2006 or 2007  . Dilation and curettage of uterus      ABORTION  . Cervical cone biopsy  37 yrs old   Family History  Problem Relation Age of Onset  . Diabetes type II Father   . Hypertension Father   . Hypertension Mother   . Goiter Sister 54    identical twin  . Asthma Sister   . Hypertension  Sister   . Stroke Maternal Grandfather   . Diabetes Paternal Grandfather   . Heart disease Paternal Grandfather    History  Substance Use Topics  . Smoking status: Never Smoker   . Smokeless tobacco: Never Used  . Alcohol Use: No   OB History   Grav Para Term Preterm Abortions TAB SAB Ect Mult Living   1    1 1          Review of Systems  Constitutional: Negative for fever.  HENT: Positive for congestion. Negative for ear pain and rhinorrhea.   Musculoskeletal: Positive for neck pain (lateral left).  All other systems reviewed and are negative.    Allergies  Ivp dye  Home Medications   Current Outpatient Rx  Name  Route  Sig  Dispense  Refill  . albuterol (PROVENTIL HFA;VENTOLIN HFA) 108 (90 BASE) MCG/ACT inhaler   Inhalation   Inhale 2 puffs into the lungs every 4 (four) hours as needed (asthma).         Marland Kitchen aspirin-acetaminophen-caffeine (EXCEDRIN MIGRAINE) 250-250-65 MG per tablet   Oral   Take 1 tablet by mouth every 6 (six) hours as needed for pain.   30 tablet   0   . ibuprofen (ADVIL,MOTRIN) 800 MG tablet   Oral  Take 800 mg by mouth every 8 (eight) hours as needed for pain.         Marland Kitchen lisinopril-hydrochlorothiazide (PRINZIDE,ZESTORETIC) 10-12.5 MG per tablet   Oral   Take 1 tablet by mouth daily.         . megestrol (MEGACE) 40 MG tablet   Oral   Take 1 tablet (40 mg total) by mouth 2 (two) times daily.   30 tablet   3   . Norethindrone-Ethinyl Estradiol-Fe Biphas (LO LOESTRIN FE) 1 MG-10 MCG / 10 MCG tablet   Oral   Take 1 tablet by mouth daily.         Marland Kitchen oxyCODONE-acetaminophen (PERCOCET/ROXICET) 5-325 MG per tablet   Oral   Take 2 tablets by mouth every 4 (four) hours as needed for severe pain.   20 tablet   0   . promethazine (PHENERGAN) 25 MG tablet   Oral   Take 1 tablet (25 mg total) by mouth every 6 (six) hours as needed for nausea or vomiting.   30 tablet   1    BP 139/86  Pulse 75  Temp(Src) 98.3 F (36.8 C) (Oral)   Resp 18  Ht 5\' 6"  (1.676 m)  Wt 300 lb (136.079 kg)  BMI 48.44 kg/m2  SpO2 100%  LMP 03/22/2013 Physical Exam  Nursing note and vitals reviewed. Constitutional: She is oriented to person, place, and time. She appears well-developed and well-nourished.  HENT:  Head: Normocephalic and atraumatic.  Right Ear: External ear normal.  Left Ear: External ear normal.  Mouth/Throat: Oropharynx is clear and moist. No oropharyngeal exudate.  Eyes: Conjunctivae and EOM are normal. Pupils are equal, round, and reactive to light.  Neck: Normal range of motion and phonation normal. Neck supple. Decreased carotid pulses: spasm left trapezius. No rigidity. No thyromegaly present.  Cardiovascular: Normal rate, regular rhythm, normal heart sounds and intact distal pulses.   No murmur heard. Pulmonary/Chest: Effort normal and breath sounds normal. No respiratory distress.  Abdominal: Soft. Bowel sounds are normal. She exhibits no distension. There is no tenderness. There is no guarding.  Musculoskeletal: Normal range of motion.  Muscle spasm in left trapezius.   Lymphadenopathy:    She has no cervical adenopathy.       Right: No supraclavicular adenopathy present.       Left: No supraclavicular adenopathy present.  Neurological: She is alert and oriented to person, place, and time. She exhibits normal muscle tone.  Skin: Skin is warm and dry.  Psychiatric: She has a normal mood and affect. Her behavior is normal. Judgment and thought content normal.    ED Course  Procedures (including critical care time)  DIAGNOSTIC STUDIES: Oxygen Saturation is 100% on room air, normal by my interpretation.    COORDINATION OF CARE: 11:11 PM- Discussed discharging the patient with a prescription for a muscle relaxer and pain medication.  Advised the patient to apply heat to her neck.  The patient agreed to the treatment plan.     Labs Review Labs Reviewed - No data to display Imaging Review No results  found.  EKG Interpretation   None       MDM  No diagnosis found. Will treat for muscle spasm with meds and muscle relaxants    Carston Riedl K Wei Poplaski-Rasch, MD 04/19/13 [phone]

## 2013-04-21 ENCOUNTER — Emergency Department (HOSPITAL_BASED_OUTPATIENT_CLINIC_OR_DEPARTMENT_OTHER)
Admission: EM | Admit: 2013-04-21 | Discharge: 2013-04-21 | Disposition: A | Discharge status: Home / Self Care | Payer: Self-pay | Attending: Emergency Medicine | Admitting: Emergency Medicine

## 2013-04-21 ENCOUNTER — Encounter (HOSPITAL_BASED_OUTPATIENT_CLINIC_OR_DEPARTMENT_OTHER): Payer: Self-pay | Admitting: Emergency Medicine

## 2013-04-21 DIAGNOSIS — J45909 Unspecified asthma, uncomplicated: Secondary | ICD-10-CM | POA: Insufficient documentation

## 2013-04-21 DIAGNOSIS — Z79899 Other long term (current) drug therapy: Secondary | ICD-10-CM | POA: Insufficient documentation

## 2013-04-21 DIAGNOSIS — I10 Essential (primary) hypertension: Secondary | ICD-10-CM | POA: Insufficient documentation

## 2013-04-21 DIAGNOSIS — Z9089 Acquired absence of other organs: Secondary | ICD-10-CM | POA: Insufficient documentation

## 2013-04-21 DIAGNOSIS — M545 Low back pain, unspecified: Secondary | ICD-10-CM | POA: Insufficient documentation

## 2013-04-21 DIAGNOSIS — M549 Dorsalgia, unspecified: Secondary | ICD-10-CM

## 2013-04-21 DIAGNOSIS — Z862 Personal history of diseases of the blood and blood-forming organs and certain disorders involving the immune mechanism: Secondary | ICD-10-CM | POA: Insufficient documentation

## 2013-04-21 LAB — URINALYSIS, ROUTINE W REFLEX MICROSCOPIC
Bilirubin Urine: NEGATIVE
Hgb urine dipstick: NEGATIVE
Specific Gravity, Urine: 1.035 — ABNORMAL HIGH (ref 1.005–1.030)
Urobilinogen, UA: 1 mg/dL (ref 0.0–1.0)

## 2013-04-21 LAB — URINE MICROSCOPIC-ADD ON

## 2013-04-21 MED ORDER — DIAZEPAM 5 MG PO TABS: 5.0000 mg | ORAL_TABLET | Freq: Four times a day (QID) | ORAL | Status: DC | PRN

## 2013-04-21 NOTE — ED Provider Notes (Signed)
CSN: [id number]     Arrival date & time 04/21/13  1351 History   First MD Initiated Contact with Patient 04/21/13 1433     Chief Complaint  Patient presents with  . Back Pain   (Consider location/radiation/quality/duration/timing/severity/associated sxs/prior Treatment) HPI Comments: Patient presents to ER for evaluation of back pain. Patient reports that she was seen here 4 days ago with flulike symptoms and was treated for upper back and neck discomfort with Vicodin and Robaxin. She reports that the pain completely resolved, but now she has been experiencing severe pain in her lower back which worsens with any movement. She denies any injury. Pain radiates to the buttocks but not legs. No numbness, tingling or weakness in the lower extremities. She has not had any urinary symptoms. No change in bowel or bladder function.  Patient is a 37 y.o. female presenting with back pain.  Back Pain Associated symptoms: no fever     Past Medical History  Diagnosis Date  . Hypertension   . Asthma   . Migraines 2011  . Allergy   . Anemia     02/01/13 - Pt states she is not anemic anymore  . Abnormal Pap smear 37 yr old   Past Surgical History  Procedure Laterality Date  . Cholecystectomy  2006 or 2007  . Dilation and curettage of uterus      ABORTION  . Cervical cone biopsy  37 yrs old   Family History  Problem Relation Age of Onset  . Diabetes type II Father   . Hypertension Father   . Hypertension Mother   . Goiter Sister 1    identical twin  . Asthma Sister   . Hypertension Sister   . Stroke Maternal Grandfather   . Diabetes Paternal Grandfather   . Heart disease Paternal Grandfather    History  Substance Use Topics  . Smoking status: Never Smoker   . Smokeless tobacco: Never Used  . Alcohol Use: No   OB History   Grav Para Term Preterm Abortions TAB SAB Ect Mult Living   1    1 1          Review of Systems  Constitutional: Negative for fever.  Genitourinary:  Negative.   Musculoskeletal: Positive for back pain.  All other systems reviewed and are negative.    Allergies  Ivp dye  Home Medications   Current Outpatient Rx  Name  Route  Sig  Dispense  Refill  . albuterol (PROVENTIL HFA;VENTOLIN HFA) 108 (90 BASE) MCG/ACT inhaler   Inhalation   Inhale 2 puffs into the lungs every 4 (four) hours as needed (asthma).         Marland Kitchen aspirin-acetaminophen-caffeine (EXCEDRIN MIGRAINE) 250-250-65 MG per tablet   Oral   Take 1 tablet by mouth every 6 (six) hours as needed for pain.   30 tablet   0   . diazepam (VALIUM) 5 MG tablet   Oral   Take 1 tablet (5 mg total) by mouth every 6 (six) hours as needed for anxiety (spasms).   15 tablet   0   . HYDROcodone-acetaminophen (NORCO/VICODIN) 5-325 MG per tablet   Oral   Take 1 tablet by mouth every 6 (six) hours as needed.   10 tablet   0   . ibuprofen (ADVIL,MOTRIN) 800 MG tablet   Oral   Take 800 mg by mouth every 8 (eight) hours as needed for pain.         Marland Kitchen lisinopril-hydrochlorothiazide (PRINZIDE,ZESTORETIC) 10-12.5 MG per  tablet   Oral   Take 1 tablet by mouth daily.         . megestrol (MEGACE) 40 MG tablet   Oral   Take 1 tablet (40 mg total) by mouth 2 (two) times daily.   30 tablet   3   . Norethindrone-Ethinyl Estradiol-Fe Biphas (LO LOESTRIN FE) 1 MG-10 MCG / 10 MCG tablet   Oral   Take 1 tablet by mouth daily.         Marland Kitchen oxyCODONE-acetaminophen (PERCOCET/ROXICET) 5-325 MG per tablet   Oral   Take 2 tablets by mouth every 4 (four) hours as needed for severe pain.   20 tablet   0   . promethazine (PHENERGAN) 25 MG tablet   Oral   Take 1 tablet (25 mg total) by mouth every 6 (six) hours as needed for nausea or vomiting.   30 tablet   1    BP 120/53  Pulse 72  Temp(Src) 97.9 F (36.6 C) (Oral)  Resp 16  Ht 5\' 6"  (1.676 m)  Wt 300 lb (136.079 kg)  BMI 48.44 kg/m2  SpO2 100%  LMP 03/22/2013 Physical Exam  Constitutional: She is oriented to person, place,  and time. She appears well-developed and well-nourished. No distress.  HENT:  Head: Normocephalic and atraumatic.  Right Ear: Hearing normal.  Left Ear: Hearing normal.  Nose: Nose normal.  Mouth/Throat: Oropharynx is clear and moist and mucous membranes are normal.  Eyes: Conjunctivae and EOM are normal. Pupils are equal, round, and reactive to light.  Neck: Normal range of motion. Neck supple.  Cardiovascular: Regular rhythm, S1 normal and S2 normal.  Exam reveals no gallop and no friction rub.   No murmur heard. Pulmonary/Chest: Effort normal and breath sounds normal. No respiratory distress. She exhibits no tenderness.  Abdominal: Soft. Normal appearance and bowel sounds are normal. There is no hepatosplenomegaly. There is no tenderness. There is no rebound, no guarding, no tenderness at McBurney's point and negative Murphy's sign. No hernia.  Musculoskeletal: Normal range of motion.       Lumbar back: She exhibits tenderness and spasm. She exhibits no bony tenderness.  Neurological: She is alert and oriented to person, place, and time. She has normal strength. No cranial nerve deficit or sensory deficit. Coordination normal. GCS eye subscore is 4. GCS verbal subscore is 5. GCS motor subscore is 6.  Skin: Skin is warm, dry and intact. No rash noted. No cyanosis.  Psychiatric: She has a normal mood and affect. Her speech is normal and behavior is normal. Thought content normal.    ED Course  Procedures (including critical care time) Labs Review Labs Reviewed  URINALYSIS, ROUTINE W REFLEX MICROSCOPIC - Abnormal; Notable for the following:    APPearance CLOUDY (*)    Specific Gravity, Urine 1.035 (*)    Leukocytes, UA SMALL (*)    All other components within normal limits  URINE MICROSCOPIC-ADD ON - Abnormal; Notable for the following:    Squamous Epithelial / LPF FEW (*)    Bacteria, UA MANY (*)    All other components within normal limits   Imaging Review No results  found.  EKG Interpretation   None       MDM   1. Back pain    Patient presents to the ER with musculoskeletal back pain. Examination reveals back tenderness without any associated neurologic findings. Patient's strength, sensation and reflexes were normal. As such, patient did not require any imaging or further studies. Patient was  treated with Valium for muscle spasm, discontinue Robaxin. Continue Vicodin as needed.    Gilda Crease, MD 04/21/13 [phone]

## 2013-04-21 NOTE — ED Notes (Signed)
C/o lower back pain-pain worse with movement-seen here 4 days ago for generalized pain

## 2013-04-22 LAB — URINE CULTURE: Colony Count: >=100000

## 2013-05-06 ENCOUNTER — Emergency Department (HOSPITAL_BASED_OUTPATIENT_CLINIC_OR_DEPARTMENT_OTHER)
Admission: EM | Admit: 2013-05-06 | Discharge: 2013-05-06 | Disposition: A | Discharge status: Home / Self Care | Payer: Self-pay | Attending: Emergency Medicine | Admitting: Emergency Medicine

## 2013-05-06 ENCOUNTER — Encounter (HOSPITAL_BASED_OUTPATIENT_CLINIC_OR_DEPARTMENT_OTHER): Payer: Self-pay | Admitting: Emergency Medicine

## 2013-05-06 DIAGNOSIS — Z862 Personal history of diseases of the blood and blood-forming organs and certain disorders involving the immune mechanism: Secondary | ICD-10-CM | POA: Insufficient documentation

## 2013-05-06 DIAGNOSIS — Z79899 Other long term (current) drug therapy: Secondary | ICD-10-CM | POA: Insufficient documentation

## 2013-05-06 DIAGNOSIS — J040 Acute laryngitis: Secondary | ICD-10-CM | POA: Insufficient documentation

## 2013-05-06 DIAGNOSIS — G43909 Migraine, unspecified, not intractable, without status migrainosus: Secondary | ICD-10-CM | POA: Insufficient documentation

## 2013-05-06 DIAGNOSIS — I10 Essential (primary) hypertension: Secondary | ICD-10-CM | POA: Insufficient documentation

## 2013-05-06 DIAGNOSIS — J45909 Unspecified asthma, uncomplicated: Secondary | ICD-10-CM | POA: Insufficient documentation

## 2013-05-06 LAB — RAPID STREP SCREEN (MED CTR MEBANE ONLY): STREPTOCOCCUS, GROUP A SCREEN (DIRECT): NEGATIVE

## 2013-05-06 MED ORDER — NAPROXEN 375 MG PO TABS: 375.0000 mg | ORAL_TABLET | Freq: Two times a day (BID) | ORAL | Status: DC

## 2013-05-06 MED ORDER — LIDOCAINE VISCOUS 2 % MT SOLN
15.0000 mL | Freq: Once | OROMUCOSAL | Status: AC
  Administered 2013-05-06: 15 mL via OROMUCOSAL
  Filled 2013-05-06: qty 15

## 2013-05-06 NOTE — Discharge Instructions (Signed)
Laryngitis At the top of your windpipe is your voice box. It is the source of your voice. Inside your voice box are 2 bands of muscles called vocal cords. When you breathe, your vocal cords are relaxed and open so that air can get into the lungs. When you decide to say something, these cords come together and vibrate. The sound from these vibrations goes into your throat and comes out through your mouth as sound. Laryngitis is an inflammation of the vocal cords that causes hoarseness, cough, loss of voice, sore throat, and dry throat. Laryngitis can be temporary (acute) or long-term (chronic). Most cases of acute laryngitis improve with time.Chronic laryngitis lasts for more than 3 weeks. CAUSES Laryngitis can often be related to excessive smoking, talking, or yelling, as well as inhalation of toxic fumes and allergies. Acute laryngitis is usually caused by a viral infection, vocal strain, measles or mumps, or bacterial infections. Chronic laryngitis is usually caused by vocal cord strain, vocal cord injury, postnasal drip, growths on the vocal cords, or acid reflux. SYMPTOMS   Cough.  Sore throat.  Dry throat. RISK FACTORS  Respiratory infections.  Exposure to irritating substances, such as cigarette smoke, excessive amounts of alcohol, stomach acids, and workplace chemicals.  Voice trauma, such as vocal cord injury from shouting or speaking too loud. DIAGNOSIS  Your cargiver will perform a physical exam. During the physical exam, your caregiver will examine your throat. The most common sign of laryngitis is hoarseness. Laryngoscopy may be necessary to confirm the diagnosis of this condition. This procedure allows your caregiver to look into the larynx. HOME CARE INSTRUCTIONS  Drink enough fluids to keep your urine clear or pale yellow.  Rest until you no longer have symptoms or as directed by your caregiver.  Breathe in moist air.  Take all medicine as directed by your  caregiver.  Do not smoke.  Talk as little as possible (this includes whispering).  Write on paper instead of talking until your voice is back to normal.  Follow up with your caregiver if your condition has not improved after 10 days. SEEK MEDICAL CARE IF:   You have trouble breathing.  You cough up blood.  You have persistent fever.  You have increasing pain.  You have difficulty swallowing. MAKE SURE YOU:  Understand these instructions.  Will watch your condition.  Will get help right away if you are not doing well or get worse. Document Released: 04/10/2005 Document Revised: 07/03/2011 Document Reviewed: 06/16/2010 [employer] Patient Information 2014 [employer].  

## 2013-05-06 NOTE — ED Notes (Signed)
Sore throat since Sunday

## 2013-05-06 NOTE — ED Provider Notes (Signed)
CSN: [id number]     Arrival date & time 05/06/13  0539 History   First MD Initiated Contact with Patient 05/06/13 0550     Chief Complaint  Patient presents with  . Sore Throat   (Consider location/radiation/quality/duration/timing/severity/associated sxs/prior Treatment) Patient is a 38 y.o. female presenting with pharyngitis. The history is provided by the patient.  Sore Throat This is a new problem. The current episode started 6 to 12 hours ago. The problem occurs constantly. The problem has not changed since onset.Pertinent negatives include no chest pain, no abdominal pain, no headaches and no shortness of breath. Nothing aggravates the symptoms. Nothing relieves the symptoms. She has tried nothing for the symptoms. The treatment provided no relief.    Past Medical History  Diagnosis Date  . Hypertension   . Asthma   . Migraines 2011  . Allergy   . Anemia     02/01/13 - Pt states she is not anemic anymore  . Abnormal Pap smear 38 yr old   Past Surgical History  Procedure Laterality Date  . Cholecystectomy  2006 or 2007  . Dilation and curettage of uterus      ABORTION  . Cervical cone biopsy  38 yrs old   Family History  Problem Relation Age of Onset  . Diabetes type II Father   . Hypertension Father   . Hypertension Mother   . Goiter Sister [id number]    identical twin  . Asthma Sister   . Hypertension Sister   . Stroke Maternal Grandfather   . Diabetes Paternal Grandfather   . Heart disease Paternal Grandfather    History  Substance Use Topics  . Smoking status: Never Smoker   . Smokeless tobacco: Never Used  . Alcohol Use: No   OB History   Grav Para Term Preterm Abortions TAB SAB Ect Mult Living   1    1 1          Review of Systems  Constitutional: Negative for fever.  HENT: Negative for drooling and trouble swallowing.   Respiratory: Negative for shortness of breath.   Cardiovascular: Negative for chest pain.  Gastrointestinal: Negative for abdominal pain.   Neurological: Negative for headaches.  All other systems reviewed and are negative.    Allergies  Ivp dye  Home Medications   Current Outpatient Rx  Name  Route  Sig  Dispense  Refill  . albuterol (PROVENTIL HFA;VENTOLIN HFA) 108 (90 BASE) MCG/ACT inhaler   Inhalation   Inhale 2 puffs into the lungs every 4 (four) hours as needed (asthma).         Marland Kitchen. aspirin-acetaminophen-caffeine (EXCEDRIN MIGRAINE) 250-250-65 MG per tablet   Oral   Take 1 tablet by mouth every 6 (six) hours as needed for pain.   30 tablet   0   . ibuprofen (ADVIL,MOTRIN) 800 MG tablet   Oral   Take 800 mg by mouth every 8 (eight) hours as needed for pain.         Marland Kitchen. lisinopril-hydrochlorothiazide (PRINZIDE,ZESTORETIC) 10-12.5 MG per tablet   Oral   Take 1 tablet by mouth daily.         . Norethindrone-Ethinyl Estradiol-Fe Biphas (LO LOESTRIN FE) 1 MG-10 MCG / 10 MCG tablet   Oral   Take 1 tablet by mouth daily.         . diazepam (VALIUM) 5 MG tablet   Oral   Take 1 tablet (5 mg total) by mouth every 6 (six) hours as needed for anxiety (spasms).  15 tablet   0   . HYDROcodone-acetaminophen (NORCO/VICODIN) 5-325 MG per tablet   Oral   Take 1 tablet by mouth every 6 (six) hours as needed.   10 tablet   0   . megestrol (MEGACE) 40 MG tablet   Oral   Take 1 tablet (40 mg total) by mouth 2 (two) times daily.   30 tablet   3   . oxyCODONE-acetaminophen (PERCOCET/ROXICET) 5-325 MG per tablet   Oral   Take 2 tablets by mouth every 4 (four) hours as needed for severe pain.   20 tablet   0   . promethazine (PHENERGAN) 25 MG tablet   Oral   Take 1 tablet (25 mg total) by mouth every 6 (six) hours as needed for nausea or vomiting.   30 tablet   1    BP 165/87  Pulse 88  Temp(Src) 97.8 F (36.6 C) (Oral)  Resp 20  SpO2 100%  LMP 05/03/2013 Physical Exam  Constitutional: She is oriented to person, place, and time. She appears well-developed and well-nourished. No distress.  HENT:   Head: Normocephalic and atraumatic.  Mouth/Throat: Oropharynx is clear and moist. No oropharyngeal exudate.  Eyes: Conjunctivae are normal. Pupils are equal, round, and reactive to light.  Neck: Normal range of motion. Neck supple.  No pain with displacement of the trachea  Cardiovascular: Normal rate and regular rhythm.   Pulmonary/Chest: Effort normal and breath sounds normal. No stridor. No respiratory distress. She has no wheezes. She has no rales.  Abdominal: Soft. Bowel sounds are normal. There is no tenderness. There is no rebound and no guarding.  Musculoskeletal: Normal range of motion.  Lymphadenopathy:    She has no cervical adenopathy.  Neurological: She is alert and oriented to person, place, and time.  Skin: Skin is warm and dry.  Psychiatric: She has a normal mood and affect.    ED Course  Procedures (including critical care time) Labs Review Labs Reviewed  RAPID STREP SCREEN  CULTURE, GROUP A STREP   Imaging Review No results found.  EKG Interpretation   None       MDM  No diagnosis found. Salt water gargles, NSAIDS and chlorseptic spray   Based on centor criteria will not treat    Mary Gadsby K Willeen Novak-Rasch, MD 05/06/13 [phone]

## 2013-05-07 LAB — CULTURE, GROUP A STREP

## 2013-05-26 ENCOUNTER — Encounter (HOSPITAL_COMMUNITY): Payer: Self-pay | Admitting: Emergency Medicine

## 2013-05-26 ENCOUNTER — Emergency Department (HOSPITAL_COMMUNITY)
Admission: EM | Admit: 2013-05-26 | Discharge: 2013-05-26 | Disposition: A | Discharge status: Home / Self Care | Payer: Self-pay | Attending: Emergency Medicine | Admitting: Emergency Medicine

## 2013-05-26 DIAGNOSIS — I10 Essential (primary) hypertension: Secondary | ICD-10-CM | POA: Insufficient documentation

## 2013-05-26 DIAGNOSIS — R599 Enlarged lymph nodes, unspecified: Secondary | ICD-10-CM | POA: Insufficient documentation

## 2013-05-26 DIAGNOSIS — R059 Cough, unspecified: Secondary | ICD-10-CM | POA: Insufficient documentation

## 2013-05-26 DIAGNOSIS — J029 Acute pharyngitis, unspecified: Secondary | ICD-10-CM | POA: Insufficient documentation

## 2013-05-26 DIAGNOSIS — R05 Cough: Secondary | ICD-10-CM | POA: Insufficient documentation

## 2013-05-26 DIAGNOSIS — Z79899 Other long term (current) drug therapy: Secondary | ICD-10-CM | POA: Insufficient documentation

## 2013-05-26 DIAGNOSIS — J45909 Unspecified asthma, uncomplicated: Secondary | ICD-10-CM | POA: Insufficient documentation

## 2013-05-26 MED ORDER — DEXAMETHASONE 6 MG PO TABS
10.0000 mg | ORAL_TABLET | ORAL | Status: AC
  Administered 2013-05-26: 10 mg via ORAL
  Filled 2013-05-26: qty 1

## 2013-05-26 NOTE — ED Provider Notes (Signed)
CSN: [id number]     Arrival date & time 05/26/13  4098 History   First MD Initiated Contact with Patient 05/26/13 0654     Chief Complaint  Patient presents with  . Sore Throat   (Consider location/radiation/quality/duration/timing/severity/associated sxs/prior Treatment) Patient is a 38 y.o. female presenting with pharyngitis. The history is provided by the patient.  Sore Throat This is a new problem. Episode onset: 3 weeks ago. The problem occurs constantly. The problem has not changed since onset.Pertinent negatives include no chest pain, no abdominal pain, no headaches and no shortness of breath. Nothing aggravates the symptoms. Nothing relieves the symptoms. Treatments tried: warm tea, naproxen. The treatment provided significant relief.    Past Medical History  Diagnosis Date  . Hypertension   . Asthma   . Migraines 2011  . Allergy   . Anemia     02/01/13 - Pt states she is not anemic anymore  . Abnormal Pap smear 38 yr old   Past Surgical History  Procedure Laterality Date  . Cholecystectomy  2006 or 2007  . Dilation and curettage of uterus      ABORTION  . Cervical cone biopsy  38 yrs old   Family History  Problem Relation Age of Onset  . Diabetes type II Father   . Hypertension Father   . Hypertension Mother   . Goiter Sister 71    identical twin  . Asthma Sister   . Hypertension Sister   . Stroke Maternal Grandfather   . Diabetes Paternal Grandfather   . Heart disease Paternal Grandfather    History  Substance Use Topics  . Smoking status: Never Smoker   . Smokeless tobacco: Never Used  . Alcohol Use: No   OB History   Grav Para Term Preterm Abortions TAB SAB Ect Mult Living   1    1 1          Review of Systems  Constitutional: Negative for fever and fatigue.  HENT: Negative for congestion and drooling.   Eyes: Negative for pain.  Respiratory: Positive for cough (mild). Negative for shortness of breath.   Cardiovascular: Negative for chest pain.   Gastrointestinal: Negative for nausea, vomiting, abdominal pain and diarrhea.  Genitourinary: Negative for dysuria and hematuria.  Musculoskeletal: Negative for back pain, gait problem and neck pain.  Skin: Negative for color change.  Neurological: Negative for dizziness and headaches.  Hematological: Negative for adenopathy.  Psychiatric/Behavioral: Negative for behavioral problems.  All other systems reviewed and are negative.    Allergies  Ivp dye  Home Medications   Current Outpatient Rx  Name  Route  Sig  Dispense  Refill  . albuterol (PROVENTIL HFA;VENTOLIN HFA) 108 (90 BASE) MCG/ACT inhaler   Inhalation   Inhale 2 puffs into the lungs every 4 (four) hours as needed (asthma).         . Dextromethorphan-Guaifenesin 10-100 MG/5ML liquid   Oral   Take 10 mLs by mouth 2 (two) times daily as needed (cold).         Marland Kitchen HYDROcodone-acetaminophen (NORCO/VICODIN) 5-325 MG per tablet   Oral   Take 1 tablet by mouth every 6 (six) hours as needed.   10 tablet   0   . ibuprofen (ADVIL,MOTRIN) 800 MG tablet   Oral   Take 800 mg by mouth every 8 (eight) hours as needed for pain.         Marland Kitchen lisinopril-hydrochlorothiazide (PRINZIDE,ZESTORETIC) 10-12.5 MG per tablet   Oral   Take 1 tablet  by mouth daily.         . megestrol (MEGACE) 40 MG tablet   Oral   Take 1 tablet (40 mg total) by mouth 2 (two) times daily.   30 tablet   3    BP 140/92  Pulse 61  Temp(Src) 98.6 F (37 C) (Oral)  Resp 20  SpO2 99%  LMP 05/23/2013 Physical Exam  Nursing note and vitals reviewed. Constitutional: She is oriented to person, place, and time. She appears well-developed and well-nourished.  HENT:  Head: Normocephalic.  Mouth/Throat: Oropharynx is clear and moist. No oropharyngeal exudate.  Mild swelling in bilateral tonsils without exudate noted. No obvious asymmetry, uvula is midline.  No trismus.  Normal range of motion of the neck.  Eyes: Conjunctivae and EOM are normal.  Pupils are equal, round, and reactive to light.  Neck: Normal range of motion. Neck supple.  Mild anterior cervical adenopathy.  Cardiovascular: Normal rate, regular rhythm, normal heart sounds and intact distal pulses.  Exam reveals no gallop and no friction rub.   No murmur heard. Pulmonary/Chest: Effort normal and breath sounds normal. No respiratory distress. She has no wheezes.  Abdominal: Soft. Bowel sounds are normal. There is no tenderness. There is no rebound and no guarding.  Musculoskeletal: Normal range of motion. She exhibits no edema and no tenderness.  Neurological: She is alert and oriented to person, place, and time.  Skin: Skin is warm and dry.  Psychiatric: She has a normal mood and affect. Her behavior is normal.    ED Course  Procedures (including critical care time) Labs Review Labs Reviewed - No data to display Imaging Review No results found.  EKG Interpretation   None       MDM   1. Pharyngitis    7:25 AM 38 y.o. female who presents with sore throat for the last 3 weeks. The patient had been seen here previously and symptomatic treatment recommended. She noted significant improvement. She has been singing in the choir recently and noted a worsening of her sore throat. She denies any fevers, she is a mild cough. She is afebrile and vital signs are unremarkable here. Likely viral pharyngitis. Previous strep screen was negative. Will treat with a dose of Decadron and recommend symptomatic control.   7:26 AM:  I have discussed the diagnosis/risks/treatment options with the patient and believe the pt to be eligible for discharge home to follow-up with pcp as needed. We also discussed returning to the ED immediately if new or worsening sx occur. We discussed the sx which are most concerning (e.g., difficulty handling secretions, sob, inc swelling) that necessitate immediate return. Medications administered to the patient during their visit and any new prescriptions  provided to the patient are listed below.  Medications given during this visit Medications  dexamethasone (DECADRON) tablet 10 mg (not administered)    New Prescriptions   No medications on file      Junius ArgyleForrest S Yamin Swingler, MD 05/26/13 0730

## 2013-05-26 NOTE — ED Notes (Signed)
Pt c/o sore throat recurrent since 1/11. Pt states pain worse with swelling.

## 2013-08-14 ENCOUNTER — Encounter (HOSPITAL_COMMUNITY): Payer: Self-pay | Admitting: Emergency Medicine

## 2013-08-14 ENCOUNTER — Emergency Department (HOSPITAL_COMMUNITY)
Admission: EM | Admit: 2013-08-14 | Discharge: 2013-08-14 | Disposition: A | Discharge status: Home / Self Care | Payer: Self-pay | Attending: Emergency Medicine | Admitting: Emergency Medicine

## 2013-08-14 DIAGNOSIS — J45909 Unspecified asthma, uncomplicated: Secondary | ICD-10-CM | POA: Insufficient documentation

## 2013-08-14 DIAGNOSIS — G43909 Migraine, unspecified, not intractable, without status migrainosus: Secondary | ICD-10-CM | POA: Insufficient documentation

## 2013-08-14 DIAGNOSIS — IMO0002 Reserved for concepts with insufficient information to code with codable children: Secondary | ICD-10-CM | POA: Insufficient documentation

## 2013-08-14 DIAGNOSIS — I10 Essential (primary) hypertension: Secondary | ICD-10-CM | POA: Insufficient documentation

## 2013-08-14 DIAGNOSIS — Z862 Personal history of diseases of the blood and blood-forming organs and certain disorders involving the immune mechanism: Secondary | ICD-10-CM | POA: Insufficient documentation

## 2013-08-14 DIAGNOSIS — J309 Allergic rhinitis, unspecified: Secondary | ICD-10-CM

## 2013-08-14 DIAGNOSIS — Z79899 Other long term (current) drug therapy: Secondary | ICD-10-CM | POA: Insufficient documentation

## 2013-08-14 LAB — RAPID STREP SCREEN (MED CTR MEBANE ONLY): Streptococcus, Group A Screen (Direct): NEGATIVE

## 2013-08-14 MED ORDER — ALBUTEROL SULFATE HFA 108 (90 BASE) MCG/ACT IN AERS
2.0000 | INHALATION_SPRAY | Freq: Once | RESPIRATORY_TRACT | Status: AC
  Administered 2013-08-14: 2 via RESPIRATORY_TRACT
  Filled 2013-08-14: qty 6.7

## 2013-08-14 MED ORDER — FLUTICASONE PROPIONATE 50 MCG/ACT NA SUSP: 2.0000 | Freq: Every day | NASAL | Status: DC

## 2013-08-14 MED ORDER — IBUPROFEN 600 MG PO TABS: 600.0000 mg | ORAL_TABLET | Freq: Four times a day (QID) | ORAL | Status: DC | PRN

## 2013-08-14 NOTE — ED Provider Notes (Signed)
Medical screening examination/treatment/procedure(s) were performed by non-physician practitioner and as supervising physician I was immediately available for consultation/collaboration.   EKG Interpretation None       Ashunti Schofield M Soliana Kitko, MD 08/14/13 0559 

## 2013-08-14 NOTE — ED Notes (Signed)
Pt c/o sore throat onset this am, HA onset yesterday.

## 2013-08-14 NOTE — ED Provider Notes (Signed)
CSN: [id number]     Arrival date & time 08/14/13  0458 History   First MD Initiated Contact with Patient 08/14/13 [phone]     Chief Complaint  Patient presents with  . Sore Throat     (Consider location/radiation/quality/duration/timing/severity/associated sxs/prior Treatment) HPI Comments: Patient with hx of allergic rhinitis. Presents for symptoms c/w same. Sore throat began upon waking. Patient states it "feels dry". No drooling or trouble swallowing.  Patient is a 38 y.o. female presenting with pharyngitis. The history is provided by the patient. No language interpreter was used.  Sore Throat This is a new problem. The current episode started yesterday. The problem occurs constantly. The problem has been unchanged. Associated symptoms include congestion, coughing and a sore throat. Pertinent negatives include no fever, neck pain or vomiting. The symptoms are aggravated by swallowing and coughing. Treatments tried: Careers adviserAllegra. The treatment provided no relief.    Past Medical History  Diagnosis Date  . Hypertension   . Asthma   . Migraines 2011  . Allergy   . Anemia     02/01/13 - Pt states she is not anemic anymore  . Abnormal Pap smear 38 yr old   Past Surgical History  Procedure Laterality Date  . Cholecystectomy  2006 or 2007  . Dilation and curettage of uterus      ABORTION  . Cervical cone biopsy  38 yrs old   Family History  Problem Relation Age of Onset  . Diabetes type II Father   . Hypertension Father   . Hypertension Mother   . Goiter Sister [id number]    identical twin  . Asthma Sister   . Hypertension Sister   . Stroke Maternal Grandfather   . Diabetes Paternal Grandfather   . Heart disease Paternal Grandfather    History  Substance Use Topics  . Smoking status: Never Smoker   . Smokeless tobacco: Never Used  . Alcohol Use: No   OB History   Grav Para Term Preterm Abortions TAB SAB Ect Mult Living   1    1 1          Review of Systems  Constitutional:  Negative for fever.  HENT: Positive for congestion, postnasal drip, rhinorrhea, sinus pressure and sore throat. Negative for drooling and trouble swallowing.   Respiratory: Positive for cough and chest tightness. Negative for shortness of breath and wheezing.   Gastrointestinal: Negative for vomiting.  Musculoskeletal: Negative for neck pain.  All other systems reviewed and are negative.     Allergies  Ivp dye  Home Medications   Prior to Admission medications   Medication Sig Start Date End Date Taking? Authorizing Provider  albuterol (PROVENTIL HFA;VENTOLIN HFA) 108 (90 BASE) MCG/ACT inhaler Inhale 2 puffs into the lungs every 4 (four) hours as needed (asthma).    Historical Provider, MD  Dextromethorphan-Guaifenesin 10-100 MG/5ML liquid Take 10 mLs by mouth 2 (two) times daily as needed (cold).    Historical Provider, MD  fluticasone (FLONASE) 50 MCG/ACT nasal spray Place 2 sprays into both nostrils daily. 08/14/13   Antony MaduraKelly Lundyn Coste, PA-C  HYDROcodone-acetaminophen (NORCO/VICODIN) 5-325 MG per tablet Take 1 tablet by mouth every 6 (six) hours as needed. 04/18/13   April K Palumbo-Rasch, MD  ibuprofen (ADVIL,MOTRIN) 600 MG tablet Take 1 tablet (600 mg total) by mouth every 6 (six) hours as needed. 08/14/13   Antony MaduraKelly Livie Vanderhoof, PA-C  lisinopril-hydrochlorothiazide (PRINZIDE,ZESTORETIC) 10-12.5 MG per tablet Take 1 tablet by mouth daily.    Historical Provider, MD  megestrol (MEGACE) 40  MG tablet Take 1 tablet (40 mg total) by mouth 2 (two) times daily. 04/09/13   Reva Boresanya S Pratt, MD   BP 160/86  Pulse 69  Temp(Src) 97.9 F (36.6 C) (Oral)  Resp 19  Ht 5\' 6"  (1.676 m)  Wt 308 lb (139.708 kg)  BMI 49.74 kg/m2  SpO2 100%  LMP 06/24/2013  Physical Exam  Nursing note and vitals reviewed. Constitutional: She is oriented to person, place, and time. She appears well-developed and well-nourished. No distress.  HENT:  Head: Normocephalic and atraumatic.  Right Ear: Hearing and external ear normal.   Left Ear: Hearing and external ear normal.  Nose: No sinus tenderness. Right sinus exhibits maxillary sinus tenderness and frontal sinus tenderness. Left sinus exhibits maxillary sinus tenderness and frontal sinus tenderness.  Mouth/Throat: Uvula is midline, oropharynx is clear and moist and mucous membranes are normal. No oral lesions. No trismus in the jaw. No dental abscesses or uvula swelling. No oropharyngeal exudate, posterior oropharyngeal edema or posterior oropharyngeal erythema.  Patient tolerating secretions without difficulty or drooling  Eyes: Conjunctivae and EOM are normal. Pupils are equal, round, and reactive to light. No scleral icterus.  Neck: Normal range of motion. Neck supple.  No stridor  Cardiovascular: Normal rate, regular rhythm and normal heart sounds.   Pulmonary/Chest: Effort normal and breath sounds normal. No stridor. No respiratory distress. She has no wheezes. She has no rales.  Lungs clear bilaterally. No wheezes or rales. Chest expansion symmetric.  Musculoskeletal: Normal range of motion.  Neurological: She is alert and oriented to person, place, and time.  Skin: Skin is warm and dry. No rash noted. She is not diaphoretic. No erythema. No pallor.  Psychiatric: She has a normal mood and affect. Her behavior is normal.    ED Course  Procedures (including critical care time) Labs Review Labs Reviewed  RAPID STREP SCREEN  CULTURE, GROUP A STREP    Imaging Review No results found.   EKG Interpretation None      MDM   Final diagnoses:  Allergic sinusitis   Patient complaining of symptoms of allergic rhinitis; she endorses hx of same. Mild to moderate symptoms of clear/yellow nasal discharge/congestion, PND, and scratchy throat with cough for less than 10 days. Patient is afebrile. No concern for acute bacterial rhinosinusitis. Patient discharged with symptomatic treatment. Patient instructions given for warm saline nasal washes. Recommendations for  follow-up with primary care physician. Return precautions provided and patient agreeable to plan with no unaddressed concerns.   Filed Vitals:   08/14/13 0503  BP: 160/86  Pulse: 69  Temp: 97.9 F (36.6 C)  TempSrc: Oral  Resp: 19  Height: 5\' 6"  (1.676 m)  Weight: 308 lb (139.708 kg)  SpO2: 100%        Antony MaduraKelly Coral Timme, PA-C 08/14/13 [phone]

## 2013-08-14 NOTE — Discharge Instructions (Signed)
Allergic Rhinitis Allergic rhinitis is when the mucous membranes in the nose respond to allergens. Allergens are particles in the air that cause your body to have an allergic reaction. This causes you to release allergic antibodies. Through a chain of events, these eventually cause you to release histamine into the blood stream. Although meant to protect the body, it is this release of histamine that causes your discomfort, such as frequent sneezing, congestion, and an itchy, runny nose.  CAUSES  Seasonal allergic rhinitis (hay fever) is caused by pollen allergens that may come from grasses, trees, and weeds. Year-round allergic rhinitis (perennial allergic rhinitis) is caused by allergens such as house dust mites, pet dander, and mold spores.  SYMPTOMS   Nasal stuffiness (congestion).  Itchy, runny nose with sneezing and tearing of the eyes. DIAGNOSIS  Your health care provider can help you determine the allergen or allergens that trigger your symptoms. If you and your health care provider are unable to determine the allergen, skin or blood testing may be used. TREATMENT  Allergic Rhinitis does not have a cure, but it can be controlled by:  Medicines and allergy shots (immunotherapy).  Avoiding the allergen. Hay fever may often be treated with antihistamines in pill or nasal spray forms. Antihistamines block the effects of histamine. There are over-the-counter medicines that may help with nasal congestion and swelling around the eyes. Check with your health care provider before taking or giving this medicine.  If avoiding the allergen or the medicine prescribed do not work, there are many new medicines your health care provider can prescribe. Stronger medicine may be used if initial measures are ineffective. Desensitizing injections can be used if medicine and avoidance does not work. Desensitization is when a patient is given ongoing shots until the body becomes less sensitive to the allergen.  Make sure you follow up with your health care provider if problems continue. HOME CARE INSTRUCTIONS It is not possible to completely avoid allergens, but you can reduce your symptoms by taking steps to limit your exposure to them. It helps to know exactly what you are allergic to so that you can avoid your specific triggers. SEEK MEDICAL CARE IF:   You have a fever.  You develop a cough that does not stop easily (persistent).  You have shortness of breath.  You start wheezing.  Symptoms interfere with normal daily activities. Document Released: 01/03/2001 Document Revised: 01/29/2013 Document Reviewed: 12/16/2012 [employer] Patient Information 2014 [employer]  

## 2013-08-15 LAB — CULTURE, GROUP A STREP

## 2013-12-18 ENCOUNTER — Encounter (HOSPITAL_COMMUNITY): Payer: Self-pay | Admitting: *Deleted

## 2013-12-18 ENCOUNTER — Inpatient Hospital Stay (HOSPITAL_COMMUNITY)
Admission: AD | Admit: 2013-12-18 | Discharge: 2013-12-18 | Disposition: A | Discharge status: Home / Self Care | Payer: Self-pay | Source: Ambulatory Visit | Attending: Obstetrics and Gynecology | Admitting: Obstetrics and Gynecology

## 2013-12-18 DIAGNOSIS — N949 Unspecified condition associated with female genital organs and menstrual cycle: Secondary | ICD-10-CM | POA: Insufficient documentation

## 2013-12-18 DIAGNOSIS — N946 Dysmenorrhea, unspecified: Secondary | ICD-10-CM | POA: Insufficient documentation

## 2013-12-18 DIAGNOSIS — N938 Other specified abnormal uterine and vaginal bleeding: Secondary | ICD-10-CM | POA: Insufficient documentation

## 2013-12-18 DIAGNOSIS — I10 Essential (primary) hypertension: Secondary | ICD-10-CM | POA: Insufficient documentation

## 2013-12-18 DIAGNOSIS — R109 Unspecified abdominal pain: Secondary | ICD-10-CM | POA: Insufficient documentation

## 2013-12-18 LAB — POCT PREGNANCY, URINE: PREG TEST UR: NEGATIVE

## 2013-12-18 MED ORDER — IBUPROFEN 800 MG PO TABS: 800.0000 mg | ORAL_TABLET | Freq: Three times a day (TID) | ORAL | Status: DC

## 2013-12-18 MED ORDER — KETOROLAC TROMETHAMINE 60 MG/2ML IM SOLN
60.0000 mg | Freq: Once | INTRAMUSCULAR | Status: AC
  Administered 2013-12-18: 60 mg via INTRAMUSCULAR
  Filled 2013-12-18: qty 2

## 2013-12-18 NOTE — Discharge Instructions (Signed)
Dysmenorrhea °Menstrual cramps (dysmenorrhea) are caused by the muscles of the uterus tightening (contracting) during a menstrual period. For some women, this discomfort is merely bothersome. For others, dysmenorrhea can be severe enough to interfere with everyday activities for a few days each month. °Primary dysmenorrhea is menstrual cramps that last a couple of days when you start having menstrual periods or soon after. This often begins after a teenager starts having her period. As a woman gets older or has a baby, the cramps will usually lessen or disappear. Secondary dysmenorrhea begins later in life, lasts longer, and the pain may be stronger than primary dysmenorrhea. The pain may start before the period and last a few days after the period.  °CAUSES  °Dysmenorrhea is usually caused by an underlying problem, such as: °· The tissue lining the uterus grows outside of the uterus in other areas of the body (endometriosis). °· The endometrial tissue, which normally lines the uterus, is found in or grows into the muscular walls of the uterus (adenomyosis). °· The pelvic blood vessels are engorged with blood just before the menstrual period (pelvic congestive syndrome). °· Overgrowth of cells (polyps) in the lining of the uterus or cervix. °· Falling down of the uterus (prolapse) because of loose or stretched ligaments. °· Depression. °· Bladder problems, infection, or inflammation. °· Problems with the intestine, a tumor, or irritable bowel syndrome. °· Cancer of the female organs or bladder. °· A severely tipped uterus. °· A very tight opening or closed cervix. °· Noncancerous tumors of the uterus (fibroids). °· Pelvic inflammatory disease (PID). °· Pelvic scarring (adhesions) from a previous surgery. °· Ovarian cyst. °· An intrauterine device (IUD) used for birth control. °RISK FACTORS °You may be at greater risk of dysmenorrhea if: °· You are younger than age 30. °· You started puberty early. °· You have  irregular or heavy bleeding. °· You have never given birth. °· You have a family history of this problem. °· You are a smoker. °SIGNS AND SYMPTOMS  °· Cramping or throbbing pain in your lower abdomen. °· Headaches. °· Lower back pain. °· Nausea or vomiting. °· Diarrhea. °· Sweating or dizziness. °· Loose stools. °DIAGNOSIS  °A diagnosis is based on your history, symptoms, physical exam, diagnostic tests, or procedures. Diagnostic tests or procedures may include: °· Blood tests. °· Ultrasonography. °· An examination of the lining of the uterus (dilation and curettage, D&C). °· An examination inside your abdomen or pelvis with a scope (laparoscopy). °· X-rays. °· CT scan. °· MRI. °· An examination inside the bladder with a scope (cystoscopy). °· An examination inside the intestine or stomach with a scope (colonoscopy, gastroscopy). °TREATMENT  °Treatment depends on the cause of the dysmenorrhea. Treatment may include: °· Pain medicine prescribed by your health care provider. °· Birth control pills or an IUD with progesterone hormone in it. °· Hormone replacement therapy. °· Nonsteroidal anti-inflammatory drugs (NSAIDs). These may help stop the production of prostaglandins. °· Surgery to remove adhesions, endometriosis, ovarian cyst, or fibroids. °· Removal of the uterus (hysterectomy). °· Progesterone shots to stop the menstrual period. °· Cutting the nerves on the sacrum that go to the female organs (presacral neurectomy). °· Electric current to the sacral nerves (sacral nerve stimulation). °· Antidepressant medicine. °· Psychiatric therapy, counseling, or group therapy. °· Exercise and physical therapy. °· Meditation and yoga therapy. °· Acupuncture. °HOME CARE INSTRUCTIONS  °· Only take over-the-counter or prescription medicines as directed by your health care provider. °· Place a heating pad   or hot water bottle on your lower back or abdomen. Do not sleep with the heating pad.  Use aerobic exercises, walking,  swimming, biking, and other exercises to help lessen the cramping.  Massage to the lower back or abdomen may help.  Stop smoking.  Avoid alcohol and caffeine. SEEK MEDICAL CARE IF:   Your pain does not get better with medicine.  You have pain with sexual intercourse.  Your pain increases and is not controlled with medicines.  You have abnormal vaginal bleeding with your period.  You develop nausea or vomiting with your period that is not controlled with medicine. SEEK IMMEDIATE MEDICAL CARE IF:  You pass out.  Document Released: 04/10/2005 Document Revised: 12/11/2012 Document Reviewed: 09/26/2012 [facility] Patient Information 2015 [place], [place]. This information is not intended to replace advice given to you by your health care provider. Make sure you discuss any questions you have with your health care provider. Levonorgestrel intrauterine device (IUD) What is this medicine? LEVONORGESTREL IUD (LEE voe nor jes trel) is a contraceptive (birth control) device. The device is placed inside the uterus by a healthcare professional. It is used to prevent pregnancy and can also be used to treat heavy bleeding that occurs during your period. Depending on the device, it can be used for 3 to 5 years. This medicine may be used for other purposes; ask your health care provider or pharmacist if you have questions. COMMON BRAND NAME(S): Elveria Royals What should I tell my health care provider before I take this medicine? They need to know if you have any of these conditions: -abnormal Pap smear -cancer of the breast, uterus, or cervix -diabetes -endometritis -genital or pelvic infection now or in the past -have more than one sexual partner or your partner has more than one partner -heart disease -history of an ectopic or tubal pregnancy -immune system problems -IUD in place -liver disease or tumor -problems with blood clots or take blood-thinners -use intravenous drugs -uterus  of unusual shape -vaginal bleeding that has not been explained -an unusual or allergic reaction to levonorgestrel, other hormones, silicone, or polyethylene, medicines, foods, dyes, or preservatives -pregnant or trying to get pregnant -breast-feeding How should I use this medicine? This device is placed inside the uterus by a health care professional. Talk to your pediatrician regarding the use of this medicine in children. Special care may be needed. Overdosage: If you think you have taken too much of this medicine contact a poison control center or emergency room at once. NOTE: This medicine is only for you. Do not share this medicine with others. What if I miss a dose? This does not apply. What may interact with this medicine? Do not take this medicine with any of the following medications: -amprenavir -bosentan -fosamprenavir This medicine may also interact with the following medications: -aprepitant -barbiturate medicines for inducing sleep or treating seizures -bexarotene -griseofulvin -medicines to treat seizures like carbamazepine, ethotoin, felbamate, oxcarbazepine, phenytoin, topiramate -modafinil -pioglitazone -rifabutin -rifampin -rifapentine -some medicines to treat HIV infection like atazanavir, indinavir, lopinavir, nelfinavir, tipranavir, ritonavir -St. John's wort -warfarin This list may not describe all possible interactions. Give your health care provider a list of all the medicines, herbs, non-prescription drugs, or dietary supplements you use. Also tell them if you smoke, drink alcohol, or use illegal drugs. Some items may interact with your medicine. What should I watch for while using this medicine? Visit your doctor or health care professional for regular check ups. See your doctor if you or  your partner has sexual contact with others, becomes HIV positive, or gets a sexual transmitted disease. This product does not protect you against HIV infection (AIDS) or  other sexually transmitted diseases. You can check the placement of the IUD yourself by reaching up to the top of your vagina with clean fingers to feel the threads. Do not pull on the threads. It is a good habit to check placement after each menstrual period. Call your doctor right away if you feel more of the IUD than just the threads or if you cannot feel the threads at all. The IUD may come out by itself. You may become pregnant if the device comes out. If you notice that the IUD has come out use a backup birth control method like condoms and call your health care provider. Using tampons will not change the position of the IUD and are okay to use during your period. What side effects may I notice from receiving this medicine? Side effects that you should report to your doctor or health care professional as soon as possible: -allergic reactions like skin rash, itching or hives, swelling of the face, lips, or tongue -fever, flu-like symptoms -genital sores -high blood pressure -no menstrual period for 6 weeks during use -pain, swelling, warmth in the leg -pelvic pain or tenderness -severe or sudden headache -signs of pregnancy -stomach cramping -sudden shortness of breath -trouble with balance, talking, or walking -unusual vaginal bleeding, discharge -yellowing of the eyes or skin Side effects that usually do not require medical attention (report to your doctor or health care professional if they continue or are bothersome): -acne -breast pain -change in sex drive or performance -changes in weight -cramping, dizziness, or faintness while the device is being inserted -headache -irregular menstrual bleeding within first 3 to 6 months of use -nausea This list may not describe all possible side effects. Call your doctor for medical advice about side effects. You may report side effects to FDA at 1-800-FDA-1088. Where should I keep my medicine? This does not apply. NOTE: This sheet is a  summary. It may not cover all possible information. If you have questions about this medicine, talk to your doctor, pharmacist, or health care provider.  2015, [employer]. (2011-05-11 13:54:04)

## 2013-12-18 NOTE — MAU Note (Signed)
Pt reports mid abd pain since last pm,

## 2013-12-18 NOTE — MAU Provider Note (Signed)
History     CSN: [id number]  Arrival date and time: 12/18/13 4098   First Provider Initiated Contact with Patient 12/18/13 [phone]      Chief Complaint  Patient presents with  . Abdominal Pain   HPI [name] is a 38 y.o. [id number] who presents to MAU today with complaints of abdominal cramping. The patient states a long history of DUB and dysmenorrhea. She was on OCPs until January to regulate her periods, but discontinued when the OCPs stopped working. She states that she did not have a period from January to May and periods have been normal and regular since then. She states LMP of 12/14/13. She noted worsening of her cramping last night. She states that the pain is in the mid abdomen. She states that this is usual for during her periods. She took Exedrin without relief. She states only spotting today. She denies vaginal discharge. She rates her pain at 9/10 now. She sees Dr. Shawnie Pons at [facility] for primary care and management of her periods. Dr. Shawnie Pons has discussed the IUD for bleeding control and patient is unsure if she would like to proceed.   OB History   Grav Para Term Preterm Abortions TAB SAB Ect Mult Living   Past Medical History  Diagnosis Date  . Hypertension   . Asthma   . Migraines 2011  . Allergy   . Anemia     02/01/13 - Pt states she is not anemic anymore  . Abnormal Pap smear 38 yr old    Past Surgical History  Procedure Laterality Date  . Cholecystectomy  2006 or 2007  . Dilation and curettage of uterus      ABORTION  . Cervical cone biopsy  38 yrs old    Family History  Problem Relation Age of Onset  . Diabetes type II Father   . Hypertension Father   . Hypertension Mother   . Goiter Sister 37    identical twin  . Asthma Sister   . Hypertension Sister   . Stroke Maternal Grandfather   . Diabetes Paternal Grandfather   . Heart disease Paternal Grandfather     History  Substance Use Topics  . Smoking status: Never Smoker   .  Smokeless tobacco: Never Used  . Alcohol Use: No    Allergies:  Allergies  Allergen Reactions  . Ivp Dye [Iodinated Diagnostic Agents] Nausea And Vomiting    Prescriptions prior to admission  Medication Sig Dispense Refill  . albuterol (PROVENTIL HFA;VENTOLIN HFA) 108 (90 BASE) MCG/ACT inhaler Inhale 2 puffs into the lungs every 4 (four) hours as needed (asthma).      Marland Kitchen aspirin-acetaminophen-caffeine (EXCEDRIN MIGRAINE) 250-250-65 MG per tablet Take 1 tablet by mouth every 6 (six) hours as needed for headache.      . ibuprofen (ADVIL,MOTRIN) 600 MG tablet Take 1 tablet (600 mg total) by mouth every 6 (six) hours as needed.  30 tablet  0  . lisinopril-hydrochlorothiazide (PRINZIDE,ZESTORETIC) 10-12.5 MG per tablet Take 1 tablet by mouth daily.      . predniSONE (DELTASONE) 10 MG tablet Take 10 mg by mouth daily with breakfast.      . Dextromethorphan-Guaifenesin 10-100 MG/5ML liquid Take 10 mLs by mouth 2 (two) times daily as needed (cold).      . fluticasone (FLONASE) 50 MCG/ACT nasal spray Place 2 sprays into both nostrils daily.  16 g  0  .  HYDROcodone-acetaminophen (NORCO/VICODIN) 5-325 MG per tablet Take 1 tablet by mouth every 6 (six) hours as needed.  10 tablet  0  . megestrol (MEGACE) 40 MG tablet Take 1 tablet (40 mg total) by mouth 2 (two) times daily.  30 tablet  3    Review of Systems  Constitutional: Negative for fever and malaise/fatigue.  Gastrointestinal: Positive for abdominal pain.  Genitourinary:       + vaginal bleeding Neg - vaginal discharge   Physical Exam   Blood pressure 147/94, pulse 74, temperature 98 F (36.7 C), temperature source Oral, resp. rate 20, height  (1.676 m), weight 315 lb (142.883 kg), last menstrual period 12/14/2013, SpO2 98.00%.  Physical Exam  Constitutional: She is oriented to person, place, and time. She appears well-developed and well-nourished. No distress.  HENT:  Head: Normocephalic.  Cardiovascular: Normal rate.    Respiratory: Effort normal.  GI: Soft. Bowel sounds are normal. She exhibits no distension and no mass. There is no tenderness. There is no rebound and no guarding.  Genitourinary:  deferred  Neurological: She is alert and oriented to person, place, and time.  Skin: Skin is warm and dry. No erythema.  Psychiatric: She has a normal mood and affect.   Results for orders placed during the hospital encounter of 12/18/13 (from the past 24 hour(s))  POCT PREGNANCY, URINE     Status: None   Collection Time    12/18/13  5:17 AM      Result Value Ref Range   Preg Test, Ur NEGATIVE  NEGATIVE    MAU Course  Procedures None  MDM UPT - negtive 60 mg IM Toradol given in MAU - patient reports significant improvement in pain, now rated at 3/10  Assessment and Plan  A: Dysmenorrhea  P: Discharge home Rx for Ibuprofen sent to patient's pharmacy Patient will be scheduled for outpatient [place] prior to follow-up with Dr. Shawnie Pons Patient advised to call [facility] for an appointment with Dr. Shawnie Pons ASAP Patient may return to MAU as needed or if her condition were to change or worsen  Freddi Starr, PA-C  12/18/2013, 6:16 AM

## 2013-12-19 NOTE — MAU Provider Note (Signed)
Attestation of Attending Supervision of Advanced Practitioner: Evaluation and management procedures were performed by the PA/NP/CNM/OB Fellow under my supervision/collaboration. Chart reviewed and agree with management and plan.  Solange Emry V 12/19/2013 3:01 PM

## 2013-12-23 DIAGNOSIS — L980 Pyogenic granuloma: Secondary | ICD-10-CM

## 2013-12-23 HISTORY — DX: Pyogenic granuloma: L98.0

## 2014-01-05 ENCOUNTER — Encounter: Payer: Self-pay | Admitting: Family Medicine

## 2014-01-05 ENCOUNTER — Ambulatory Visit (INDEPENDENT_AMBULATORY_CARE_PROVIDER_SITE_OTHER): Payer: Self-pay | Admitting: Family Medicine

## 2014-01-05 VITALS — BP 138/80 | HR 91 | Temp 98.0°F | Ht 66.0 in | Wt 307.0 lb

## 2014-01-05 DIAGNOSIS — L989 Disorder of the skin and subcutaneous tissue, unspecified: Secondary | ICD-10-CM

## 2014-01-05 NOTE — Patient Instructions (Signed)
Nice to meet you. We are going to have you see our dermatology clinic here in the office for consideration of removal of the lesion.

## 2014-01-06 DIAGNOSIS — L989 Disorder of the skin and subcutaneous tissue, unspecified: Secondary | ICD-10-CM | POA: Insufficient documentation

## 2014-01-06 NOTE — Assessment & Plan Note (Addendum)
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 Given the vascularity of the apparent lesion we will have the patient follow-up in derm clinic for consideration of removal of this lesion. Consider excisional removal vs shave removal with electrocautery of the base to prevent recurrence. Discussed keeping the area clean with the patient and that if it were to begin bleeding uncontrollably again she should seek medical attention. Given return precautions.    Precepted with Dr Mauricio Po

## 2014-01-06 NOTE — Progress Notes (Signed)
Patient ID: Mary Hanson, female   DOB: 15-Jun-1975, 38 y.o.   MRN: [id number]  Marikay Alar, MD Phone: [phone]  Mary Hanson is a 38 y.o. female who presents today for same day clinic.  Skin growth: patient notes 2 months ago this started out as a small bump. It progressively enlarged until it appeared to have pus in it 2 weeks ago. She attempted to pop the lesion at that time though did not get any pus out. Over labor day weekend she went to the ED in [place] due to the lesion bleeding uncontrollably. States they x-rayed her hand and told her she had a skin growth. There did not appear to be any infection at that time, though they treated her with keflex. Now she notes it is still bleeding intermittently. The area stings and her whole finger hurts. There has been no surrounding erythema or fevers. She has not had a lesion like this previously.  Patient is a nonsmoker.   ROS: Per HPI   Physical Exam Filed Vitals:   01/05/14 1604  BP: 138/80  Pulse: 91  Temp: 98 F (36.7 C)    Gen: Well NAD Skin: right lateral ring finger at a location just distal to the PIP joint has an 8-10 mm red granulomatous appearing pedunculated dome shaped papule out growth with a surrounding scaling of the normal skin, no surrounding erythema, no bleeding, good sensation in the ring finger and full ROM   Assessment/Plan: Please see individual problem list.  Marikay Alar, MD [name] PGY-3

## 2014-01-07 ENCOUNTER — Ambulatory Visit (HOSPITAL_COMMUNITY)
Admission: RE | Admit: 2014-01-07 | Discharge: 2014-01-07 | Disposition: A | Discharge status: Home / Self Care | Payer: Self-pay | Source: Ambulatory Visit | Attending: Medical | Admitting: Medical

## 2014-01-07 DIAGNOSIS — N946 Dysmenorrhea, unspecified: Secondary | ICD-10-CM | POA: Insufficient documentation

## 2014-01-07 DIAGNOSIS — N831 Corpus luteum cyst of ovary, unspecified side: Secondary | ICD-10-CM | POA: Insufficient documentation

## 2014-01-08 ENCOUNTER — Inpatient Hospital Stay (HOSPITAL_COMMUNITY)
Admission: AD | Admit: 2014-01-08 | Discharge: 2014-01-08 | Disposition: A | Discharge status: Home / Self Care | Payer: Self-pay | Source: Ambulatory Visit | Attending: Obstetrics & Gynecology | Admitting: Obstetrics & Gynecology

## 2014-01-08 ENCOUNTER — Encounter (HOSPITAL_COMMUNITY): Payer: Self-pay | Admitting: *Deleted

## 2014-01-08 DIAGNOSIS — N946 Dysmenorrhea, unspecified: Secondary | ICD-10-CM | POA: Insufficient documentation

## 2014-01-08 DIAGNOSIS — I10 Essential (primary) hypertension: Secondary | ICD-10-CM | POA: Insufficient documentation

## 2014-01-08 DIAGNOSIS — A499 Bacterial infection, unspecified: Secondary | ICD-10-CM | POA: Insufficient documentation

## 2014-01-08 DIAGNOSIS — B373 Candidiasis of vulva and vagina: Secondary | ICD-10-CM | POA: Insufficient documentation

## 2014-01-08 DIAGNOSIS — N760 Acute vaginitis: Secondary | ICD-10-CM | POA: Insufficient documentation

## 2014-01-08 DIAGNOSIS — B3731 Acute candidiasis of vulva and vagina: Secondary | ICD-10-CM | POA: Insufficient documentation

## 2014-01-08 DIAGNOSIS — B9689 Other specified bacterial agents as the cause of diseases classified elsewhere: Secondary | ICD-10-CM | POA: Insufficient documentation

## 2014-01-08 LAB — URINALYSIS, ROUTINE W REFLEX MICROSCOPIC
Bilirubin Urine: NEGATIVE
GLUCOSE, UA: NEGATIVE mg/dL
Ketones, ur: NEGATIVE mg/dL
NITRITE: NEGATIVE
PROTEIN: NEGATIVE mg/dL
Specific Gravity, Urine: 1.01 (ref 1.005–1.030)
Urobilinogen, UA: 2 mg/dL — ABNORMAL HIGH (ref 0.0–1.0)
pH: 7 (ref 5.0–8.0)

## 2014-01-08 LAB — URINE MICROSCOPIC-ADD ON

## 2014-01-08 LAB — WET PREP, GENITAL
Trich, Wet Prep: NONE SEEN
Yeast Wet Prep HPF POC: NONE SEEN

## 2014-01-08 LAB — POCT PREGNANCY, URINE: PREG TEST UR: NEGATIVE

## 2014-01-08 MED ORDER — FLUCONAZOLE 150 MG PO TABS: 150.0000 mg | ORAL_TABLET | Freq: Every day | ORAL | Status: DC

## 2014-01-08 MED ORDER — TRIAMCINOLONE ACETONIDE 0.1 % EX CREA: 1.0000 "application " | TOPICAL_CREAM | Freq: Two times a day (BID) | CUTANEOUS | Status: DC

## 2014-01-08 MED ORDER — METRONIDAZOLE 500 MG PO TABS: 500.0000 mg | ORAL_TABLET | Freq: Two times a day (BID) | ORAL | Status: DC

## 2014-01-08 NOTE — MAU Provider Note (Signed)
History     CSN: [id number]  Arrival date and time: 01/08/14 1629   None     Chief Complaint  Patient presents with  . Abdominal Pain  . Vaginal Itching   HPIpt is not pregnant [id number] who presents for vaginal itching and spotting after ultrasound yesterday for severe dysmenorrhea. Pt states her itching in mostly on the inside of her vagina.  Pt has not noticed any unusual vaginal discharge.  Pt last had sex beginning of Sept. Pt has recently been on Keflex for a skin lesion on her finger.  RN note: C/o vaginal itching and spotting after her u/s yesterday; had been seen for severe period cramps; cramps are not present Now;    RN note: Deloris Carolann Littler, RN Registered Nurse Signed  MAU Note Service date: 01/08/2014 4:48 PM   C/o vaginal itching and spotting after her u/s yesterday; had been seen for severe period cramps; cramps are not present Now;      Past Medical History  Diagnosis Date  . Hypertension   . Asthma   . Migraines 2011  . Allergy   . Anemia     02/01/13 - Pt states she is not anemic anymore  . Abnormal Pap smear 38 yr old  . Abscess of fourth finger     Past Surgical History  Procedure Laterality Date  . Cholecystectomy  2006 or 2007  . Dilation and curettage of uterus      ABORTION  . Cervical cone biopsy  38 yrs old    Family History  Problem Relation Age of Onset  . Diabetes type II Father   . Hypertension Father   . Hypertension Mother   . Goiter Sister 47    identical twin  . Asthma Sister   . Hypertension Sister   . Stroke Maternal Grandfather   . Diabetes Paternal Grandfather   . Heart disease Paternal Grandfather     History  Substance Use Topics  . Smoking status: Never Smoker   . Smokeless tobacco: Never Used  . Alcohol Use: No    Allergies:  Allergies  Allergen Reactions  . Ivp Dye [Iodinated Diagnostic Agents] Nausea And Vomiting    Prescriptions prior to admission  Medication Sig Dispense Refill  . albuterol  (PROVENTIL HFA;VENTOLIN HFA) 108 (90 BASE) MCG/ACT inhaler Inhale 2 puffs into the lungs every 4 (four) hours as needed (asthma).      . fluticasone (FLONASE) 50 MCG/ACT nasal spray Place 2 sprays into both nostrils daily.  16 g  0  . ibuprofen (ADVIL,MOTRIN) 800 MG tablet Take 1 tablet (800 mg total) by mouth 3 (three) times daily.  30 tablet  2  . lisinopril-hydrochlorothiazide (PRINZIDE,ZESTORETIC) 10-12.5 MG per tablet Take 1 tablet by mouth daily.      . predniSONE (DELTASONE) 10 MG tablet Take 10 mg by mouth daily with breakfast.        Review of Systems  Constitutional: Negative for fever and chills.  Gastrointestinal: Negative for nausea, vomiting, abdominal pain, diarrhea and constipation.  Genitourinary: Negative for dysuria and urgency.   Physical Exam   Physical Exam  Vitals reviewed. Constitutional: She is oriented to person, place, and time. She appears well-developed and well-nourished. No distress.  HENT:  Head: Normocephalic.  Eyes: Pupils are equal, round, and reactive to light.  Neck: Normal range of motion. Neck supple.  Cardiovascular: Normal rate.   Respiratory: Effort normal.  GI: Soft. There is no tenderness.  Genitourinary:  Vaginal mucosa reddened; small amount  of creamy white discharge in vault; cervix clean, NT  Musculoskeletal: Normal range of motion.  Neurological: She is alert and oriented to person, place, and time.  Skin: Skin is warm and dry.  Psychiatric: She has a normal mood and affect.    MAU Course  Procedures US Transvaginal Non-ob  01/07/2014   CLINICAL DATA:  Dysmenorrhea  EXAM: TRANSABDOMINAL AND TRANSVAGINAL ULTRASOUND OF PELVIS  TECHNIQUE: Both transabdominal and transvaginal ultrasound examinations of the pelvis were performed. Transabdominal technique was performed for global imaging of the pelvis including uterus, ovaries, adnexal regions, and pelvic cul-de-sac. It was necessary to proceed with endovaginal exam following the  transabdominal exam to visualize the bilateral ovaries.  COMPARISON:  03/20/2012  FINDINGS: Uterus  Measurements: 7.4 x 4.1 x 4.8 cm. No fibroids or other mass visualized.  Endometrium  Thickness: 5 mm.  No focal abnormality visualized.  Right ovary  Measurements: 3.2 x 1.8 x 1.5 cm. Normal appearance/no adnexal mass.  Left ovary  Measurements: 2.9 x 2.5 x 2.5 cm. 1.8 x 1.5 x 1.9 cm involuting corpus luteal cyst  Other findings  No free fluid.  IMPRESSION: 1.9 cm involuting left corpus luteal cyst.  Otherwise negative pelvic ultrasound.   Electronically Signed   By: Charline Bills M.D.   On: 01/07/2014 17:14   US Pelvis Complete  01/07/2014   CLINICAL DATA:  Dysmenorrhea  EXAM: TRANSABDOMINAL AND TRANSVAGINAL ULTRASOUND OF PELVIS  TECHNIQUE: Both transabdominal and transvaginal ultrasound examinations of the pelvis were performed. Transabdominal technique was performed for global imaging of the pelvis including uterus, ovaries, adnexal regions, and pelvic cul-de-sac. It was necessary to proceed with endovaginal exam following the transabdominal exam to visualize the bilateral ovaries.  COMPARISON:  03/20/2012  FINDINGS: Uterus  Measurements: 7.4 x 4.1 x 4.8 cm. No fibroids or other mass visualized.  Endometrium  Thickness: 5 mm.  No focal abnormality visualized.  Right ovary  Measurements: 3.2 x 1.8 x 1.5 cm. Normal appearance/no adnexal mass.  Left ovary  Measurements: 2.9 x 2.5 x 2.5 cm. 1.8 x 1.5 x 1.9 cm involuting corpus luteal cyst  Other findings  No free fluid.  IMPRESSION: 1.9 cm involuting left corpus luteal cyst.  Otherwise negative pelvic ultrasound.   Electronically Signed   By: Charline Bills M.D.   On: 01/07/2014 17:14  reviewed results with pt- will follow up with Dr. Shawnie Pons Results for orders placed during the hospital encounter of 01/08/14 (from the past 24 hour(s))  URINALYSIS, ROUTINE W REFLEX MICROSCOPIC     Status: Abnormal   Collection Time    01/08/14  4:40 PM      Result Value  Ref Range   Color, Urine YELLOW  YELLOW   APPearance CLEAR  CLEAR   Specific Gravity, Urine 1.010  1.005 - 1.030   pH 7.0  5.0 - 8.0   Glucose, UA NEGATIVE  NEGATIVE mg/dL   Hgb urine dipstick LARGE (*) NEGATIVE   Bilirubin Urine NEGATIVE  NEGATIVE   Ketones, ur NEGATIVE  NEGATIVE mg/dL   Protein, ur NEGATIVE  NEGATIVE mg/dL   Urobilinogen, UA 2.0 (*) 0.0 - 1.0 mg/dL   Nitrite NEGATIVE  NEGATIVE   Leukocytes, UA SMALL (*) NEGATIVE  URINE MICROSCOPIC-ADD ON     Status: Abnormal   Collection Time    01/08/14  4:40 PM      Result Value Ref Range   Squamous Epithelial / LPF FEW (*) RARE   WBC, UA 3-6  <3 WBC/hpf   RBC /  HPF 0-2  <3 RBC/hpf   Bacteria, UA RARE  RARE   Urine-Other FEW YEAST    POCT PREGNANCY, URINE     Status: None   Collection Time    01/08/14  4:47 PM      Result Value Ref Range   Preg Test, Ur NEGATIVE  NEGATIVE  WET PREP, GENITAL     Status: Abnormal   Collection Time    01/08/14  5:10 PM      Result Value Ref Range   Yeast Wet Prep HPF POC NONE SEEN  NONE SEEN   Trich, Wet Prep NONE SEEN  NONE SEEN   Clue Cells Wet Prep HPF POC FEW (*) NONE SEEN   WBC, Wet Prep HPF POC FEW (*) NONE SEEN   GC/Chlamydia pending HIV pending  Assessment and Plan  Yeast vaginitis-RX  Diflucan  #2 RxTriamcinolonecream for external use if needed BV- flagyl  BID for 7 days F/u with Dr. Rocky Morel 01/08/2014, 5:02 PM

## 2014-01-08 NOTE — MAU Provider Note (Signed)
Attestation of Attending Supervision of Advanced Practitioner (CNM/NP): Evaluation and management procedures were performed by the Advanced Practitioner under my supervision and collaboration.  I have reviewed the Advanced Practitioner's note and chart, and I agree with the management and plan.  HARRAWAY-SMITH, Addysin Porco 9:29 PM     

## 2014-01-08 NOTE — Discharge Instructions (Signed)

## 2014-01-08 NOTE — MAU Note (Signed)
C/o vaginal itching and spotting after her u/s yesterday; had been seen for severe period cramps; cramps are not present  Now;

## 2014-01-09 LAB — GC/CHLAMYDIA PROBE AMP
CT Probe RNA: NEGATIVE
GC Probe RNA: NEGATIVE

## 2014-01-09 LAB — HIV ANTIBODY (ROUTINE TESTING W REFLEX): HIV: NONREACTIVE

## 2014-01-12 ENCOUNTER — Emergency Department (HOSPITAL_COMMUNITY)
Admission: EM | Admit: 2014-01-12 | Discharge: 2014-01-12 | Disposition: A | Discharge status: Home / Self Care | Payer: Self-pay | Attending: Emergency Medicine | Admitting: Emergency Medicine

## 2014-01-12 ENCOUNTER — Encounter (HOSPITAL_COMMUNITY): Payer: Self-pay | Admitting: Emergency Medicine

## 2014-01-12 DIAGNOSIS — L980 Pyogenic granuloma: Secondary | ICD-10-CM | POA: Insufficient documentation

## 2014-01-12 DIAGNOSIS — Z79899 Other long term (current) drug therapy: Secondary | ICD-10-CM | POA: Insufficient documentation

## 2014-01-12 DIAGNOSIS — I10 Essential (primary) hypertension: Secondary | ICD-10-CM | POA: Insufficient documentation

## 2014-01-12 DIAGNOSIS — Z862 Personal history of diseases of the blood and blood-forming organs and certain disorders involving the immune mechanism: Secondary | ICD-10-CM | POA: Insufficient documentation

## 2014-01-12 DIAGNOSIS — R229 Localized swelling, mass and lump, unspecified: Secondary | ICD-10-CM | POA: Insufficient documentation

## 2014-01-12 DIAGNOSIS — IMO0002 Reserved for concepts with insufficient information to code with codable children: Secondary | ICD-10-CM | POA: Insufficient documentation

## 2014-01-12 DIAGNOSIS — J45909 Unspecified asthma, uncomplicated: Secondary | ICD-10-CM | POA: Insufficient documentation

## 2014-01-12 DIAGNOSIS — E669 Obesity, unspecified: Secondary | ICD-10-CM | POA: Insufficient documentation

## 2014-01-12 NOTE — ED Notes (Signed)
PT states that she has had a small pimple area to her right 4th finger; pt states that that approx 2 weeks ago the area suddenly became bigger; pt states that she was seen by her PCP and was advised that it was a skin mass of vascular tissue; pt states that she was referred to the Dermatology clinic; pt states that the area has doubled in size and is bleeding off and on; area is not bleeding currently but she states it bleeds easily if the area is touched or manipulated at all.

## 2014-01-12 NOTE — ED Provider Notes (Signed)
CSN: [id number]     Arrival date & time 01/12/14  0505 History   First MD Initiated Contact with Patient 01/12/14 [phone]     Chief Complaint  Patient presents with  . Mass     (Consider location/radiation/quality/duration/timing/severity/associated sxs/prior Treatment) HPI Patient presents with mass on fourth finger of right hand middle phalanx first noticed approximately 3 weeks ago which is nonpainful, and bleeds intermittently. She's been seen by her primary care doctor at [facility] and refer to dermatology clinic who cannot see her until October. She presents here as the mass continues to enlarge and bleeds intermittently. No bleeding now. No other associated symptoms. Nothing makes symptoms better or worse. No treatment prior to coming here. Past Medical History  Diagnosis Date  . Hypertension   . Asthma   . Migraines 2011  . Allergy   . Anemia     02/01/13 - Pt states she is not anemic anymore  . Abnormal Pap smear 38 yr old  . Abscess of fourth finger    Past Surgical History  Procedure Laterality Date  . Cholecystectomy  2006 or 2007  . Dilation and curettage of uterus      ABORTION  . Cervical cone biopsy  38 yrs old   Family History  Problem Relation Age of Onset  . Diabetes type II Father   . Hypertension Father   . Hypertension Mother   . Goiter Sister 73    identical twin  . Asthma Sister   . Hypertension Sister   . Stroke Maternal Grandfather   . Diabetes Paternal Grandfather   . Heart disease Paternal Grandfather    History  Substance Use Topics  . Smoking status: Never Smoker   . Smokeless tobacco: Never Used  . Alcohol Use: No   OB History   Grav Para Term Preterm Abortions TAB SAB Ect Mult Living   Review of Systems  Constitutional: Negative.   Skin: Positive for wound.       Mass and right fourth finger      Allergies  Ivp dye  Home Medications   Prior to Admission medications   Medication  Sig Start Date End Date Taking? Authorizing Provider  acetaminophen (TYLENOL) 500 MG tablet Take 500 mg by mouth every 6 (six) hours as needed for mild pain.   Yes Historical Provider, MD  albuterol (PROVENTIL HFA;VENTOLIN HFA) 108 (90 BASE) MCG/ACT inhaler Inhale 2 puffs into the lungs every 4 (four) hours as needed for shortness of breath (asthma).    Yes Historical Provider, MD  ibuprofen (ADVIL,MOTRIN) 200 MG tablet Take 800 mg by mouth every 6 (six) hours as needed for moderate pain.   Yes Historical Provider, MD  lisinopril-hydrochlorothiazide (PRINZIDE,ZESTORETIC) 10-12.5 MG per tablet Take 1 tablet by mouth daily.   Yes Historical Provider, MD  metroNIDAZOLE (FLAGYL) 500 MG tablet Take 1 tablet (500 mg total) by mouth 2 (two) times daily. 01/08/14  Yes Jean Rosenthal, NP  Multiple Vitamin (MULTIVITAMIN WITH MINERALS) TABS tablet Take 1 tablet by mouth daily.   Yes Historical Provider, MD  traMADol (ULTRAM) 50 MG tablet Take 50 mg by mouth every 6 (six) hours as needed for moderate pain.   Yes Historical Provider, MD  triamcinolone cream (KENALOG) 0.1 % Apply 1 application topically 2 (two) times daily. 01/08/14  Yes Jean Rosenthal, NP   BP 134/80  Pulse 63  Temp(Src) 97.4  F (36.3 C) (Oral)  Resp 19  SpO2 97%  LMP 01/09/2014 Physical Exam  Nursing note and vitals reviewed. Constitutional: She appears well-developed and well-nourished. No distress.  HENT:  Head: Normocephalic and atraumatic.  Eyes: EOM are normal.  Neck: Neck supple.  Cardiovascular: Normal rate.   Pulmonary/Chest: Effort normal.  Abdominal:  Obese  Musculoskeletal:  Right hand ring finger middle phalanx ulnar aspect is a 0.5 cm mass protruding which is nontender. No active bleeding. No fluctuance. Full range of motion. Good capillary refill. Hand otherwise without abnormalities. All other extremities  neurovascularly intact        ED Course  Procedures (including critical care time) Labs Review Labs  Reviewed - No data to display  Imaging Review No results found.   EKG Interpretation None      MDM  Spoke with Dr. Mina Marble. 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

## 2014-01-12 NOTE — ED Notes (Signed)
Bed: WA03 Expected date:  Expected time:  Means of arrival:  Comments: 

## 2014-01-12 NOTE — Discharge Instructions (Signed)
Call Dr. Ronie Spies office today to schedule appointment for tomorrow or for Thursday, 01/15/2014 to arrange to have the mass (pyogenic granuloma) removed from your finger

## 2014-01-13 ENCOUNTER — Encounter (HOSPITAL_BASED_OUTPATIENT_CLINIC_OR_DEPARTMENT_OTHER): Payer: Self-pay | Admitting: *Deleted

## 2014-01-13 ENCOUNTER — Other Ambulatory Visit: Payer: Self-pay | Admitting: Orthopedic Surgery

## 2014-01-13 NOTE — Pre-Procedure Instructions (Signed)
To come for BMET, EKG and anesthesia evaluation.

## 2014-01-14 ENCOUNTER — Encounter (HOSPITAL_BASED_OUTPATIENT_CLINIC_OR_DEPARTMENT_OTHER)
Admission: RE | Admit: 2014-01-14 | Discharge: 2014-01-14 | Disposition: A | Discharge status: Home / Self Care | Payer: Self-pay | Source: Ambulatory Visit | Attending: Orthopedic Surgery | Admitting: Orthopedic Surgery

## 2014-01-14 DIAGNOSIS — Z01810 Encounter for preprocedural cardiovascular examination: Secondary | ICD-10-CM | POA: Insufficient documentation

## 2014-01-14 DIAGNOSIS — L980 Pyogenic granuloma: Secondary | ICD-10-CM | POA: Insufficient documentation

## 2014-01-14 DIAGNOSIS — Z01812 Encounter for preprocedural laboratory examination: Secondary | ICD-10-CM | POA: Insufficient documentation

## 2014-01-14 LAB — BASIC METABOLIC PANEL
Anion gap: 8 (ref 5–15)
BUN: 7 mg/dL (ref 6–23)
CALCIUM: 8.4 mg/dL (ref 8.4–10.5)
CO2: 27 mEq/L (ref 19–32)
Chloride: 105 mEq/L (ref 96–112)
Creatinine, Ser: 0.76 mg/dL (ref 0.50–1.10)
GFR calc Af Amer: >90 mL/min (ref >90)
GLUCOSE: 83 mg/dL (ref 70–99)
Potassium: 4 mEq/L (ref 3.7–5.3)
Sodium: 140 mEq/L (ref 137–147)

## 2014-01-14 NOTE — Progress Notes (Signed)
Pt arrived at [facility] for lab work and anesthesia consult. Blood drawn and EKG done but no anesthesiologist here for case review. Spoke with Dr. Berneice Heinrich who suggests calling anesthesia at main OR. Called Main OR and spoke with Dr. Jacklynn Bue who suggests pt come back tomorrow for anesthesia consult. Informed pt of situation and she will come tomorrow for eval.

## 2014-01-15 NOTE — Progress Notes (Signed)
Pt in for anesthesia consult. Evaluated by Dr. Ivin Booty who assessed airway and approved surgery.

## 2014-01-16 ENCOUNTER — Encounter (HOSPITAL_BASED_OUTPATIENT_CLINIC_OR_DEPARTMENT_OTHER)
Admission: RE | Disposition: A | Discharge status: Home / Self Care | Payer: Self-pay | Source: Ambulatory Visit | Attending: Orthopedic Surgery

## 2014-01-16 ENCOUNTER — Ambulatory Visit (HOSPITAL_BASED_OUTPATIENT_CLINIC_OR_DEPARTMENT_OTHER): Payer: Self-pay | Admitting: Anesthesiology

## 2014-01-16 ENCOUNTER — Encounter (HOSPITAL_BASED_OUTPATIENT_CLINIC_OR_DEPARTMENT_OTHER): Payer: Self-pay | Admitting: *Deleted

## 2014-01-16 ENCOUNTER — Encounter (HOSPITAL_BASED_OUTPATIENT_CLINIC_OR_DEPARTMENT_OTHER): Payer: Self-pay | Admitting: Anesthesiology

## 2014-01-16 ENCOUNTER — Ambulatory Visit (HOSPITAL_BASED_OUTPATIENT_CLINIC_OR_DEPARTMENT_OTHER)
Admission: RE | Admit: 2014-01-16 | Discharge: 2014-01-16 | Disposition: A | Discharge status: Home / Self Care | Payer: Self-pay | Source: Ambulatory Visit | Attending: Orthopedic Surgery | Admitting: Orthopedic Surgery

## 2014-01-16 DIAGNOSIS — J45909 Unspecified asthma, uncomplicated: Secondary | ICD-10-CM | POA: Insufficient documentation

## 2014-01-16 DIAGNOSIS — G43909 Migraine, unspecified, not intractable, without status migrainosus: Secondary | ICD-10-CM | POA: Insufficient documentation

## 2014-01-16 DIAGNOSIS — Z91041 Radiographic dye allergy status: Secondary | ICD-10-CM | POA: Insufficient documentation

## 2014-01-16 DIAGNOSIS — L980 Pyogenic granuloma: Secondary | ICD-10-CM | POA: Insufficient documentation

## 2014-01-16 DIAGNOSIS — I10 Essential (primary) hypertension: Secondary | ICD-10-CM | POA: Insufficient documentation

## 2014-01-16 DIAGNOSIS — L989 Disorder of the skin and subcutaneous tissue, unspecified: Secondary | ICD-10-CM

## 2014-01-16 DIAGNOSIS — Z79899 Other long term (current) drug therapy: Secondary | ICD-10-CM | POA: Insufficient documentation

## 2014-01-16 DIAGNOSIS — L98499 Non-pressure chronic ulcer of skin of other sites with unspecified severity: Secondary | ICD-10-CM | POA: Insufficient documentation

## 2014-01-16 DIAGNOSIS — Z888 Allergy status to other drugs, medicaments and biological substances status: Secondary | ICD-10-CM | POA: Insufficient documentation

## 2014-01-16 HISTORY — DX: Unspecified asthma, uncomplicated: J45.909

## 2014-01-16 HISTORY — DX: Pyogenic granuloma: L98.0

## 2014-01-16 HISTORY — PX: MASS EXCISION: SHX2000

## 2014-01-16 HISTORY — DX: Personal history of diseases of the blood and blood-forming organs and certain disorders involving the immune mechanism: Z86.2

## 2014-01-16 HISTORY — DX: Other seasonal allergic rhinitis: J30.2

## 2014-01-16 LAB — POCT HEMOGLOBIN-HEMACUE: Hemoglobin: 10.8 g/dL — ABNORMAL LOW (ref 12.0–15.0)

## 2014-01-16 SURGERY — EXCISION MASS
Anesthesia: General | Site: Finger | Laterality: Right

## 2014-01-16 MED ORDER — OXYCODONE HCL 5 MG PO TABS
ORAL_TABLET | ORAL | Status: AC
  Filled 2014-01-16: qty 1

## 2014-01-16 MED ORDER — MIDAZOLAM HCL 2 MG/2ML IJ SOLN: 1.0000 mg | INTRAMUSCULAR | Status: DC | PRN

## 2014-01-16 MED ORDER — OXYCODONE-ACETAMINOPHEN 5-325 MG PO TABS: 1.0000 | ORAL_TABLET | ORAL | Status: DC | PRN

## 2014-01-16 MED ORDER — MIDAZOLAM HCL 2 MG/ML PO SYRP: 12.0000 mg | ORAL_SOLUTION | Freq: Once | ORAL | Status: DC | PRN

## 2014-01-16 MED ORDER — BUPIVACAINE-EPINEPHRINE (PF) 0.25% -1:200000 IJ SOLN
INTRAMUSCULAR | Status: AC
  Filled 2014-01-16: qty 30

## 2014-01-16 MED ORDER — SUFENTANIL CITRATE 50 MCG/ML IV SOLN
INTRAVENOUS | Status: DC | PRN
  Administered 2014-01-16: 10 ug via INTRAVENOUS

## 2014-01-16 MED ORDER — CHLORHEXIDINE GLUCONATE 4 % EX LIQD
60.0000 mL | Freq: Once | CUTANEOUS | Status: DC
Start: 2014-01-16 — End: 2014-01-16

## 2014-01-16 MED ORDER — BUPIVACAINE HCL (PF) 0.25 % IJ SOLN
INTRAMUSCULAR | Status: AC
  Filled 2014-01-16: qty 30

## 2014-01-16 MED ORDER — DEXTROSE 5 % IV SOLN
3.0000 g | INTRAVENOUS | Status: AC
  Administered 2014-01-16: 3 g via INTRAVENOUS

## 2014-01-16 MED ORDER — SUFENTANIL CITRATE 50 MCG/ML IV SOLN
INTRAVENOUS | Status: AC
  Filled 2014-01-16: qty 1

## 2014-01-16 MED ORDER — FENTANYL CITRATE 0.05 MG/ML IJ SOLN: 50.0000 ug | INTRAMUSCULAR | Status: DC | PRN

## 2014-01-16 MED ORDER — CEFAZOLIN SODIUM 1-5 GM-% IV SOLN
INTRAVENOUS | Status: AC
  Filled 2014-01-16: qty 50

## 2014-01-16 MED ORDER — MIDAZOLAM HCL 5 MG/5ML IJ SOLN
INTRAMUSCULAR | Status: DC | PRN
  Administered 2014-01-16: 2 mg via INTRAVENOUS

## 2014-01-16 MED ORDER — LACTATED RINGERS IV SOLN
INTRAVENOUS | Status: DC
  Administered 2014-01-16: 11:00:00 via INTRAVENOUS

## 2014-01-16 MED ORDER — SUCCINYLCHOLINE CHLORIDE 20 MG/ML IJ SOLN
INTRAMUSCULAR | Status: AC
  Filled 2014-01-16: qty 1

## 2014-01-16 MED ORDER — MIDAZOLAM HCL 2 MG/2ML IJ SOLN
INTRAMUSCULAR | Status: AC
  Filled 2014-01-16: qty 2

## 2014-01-16 MED ORDER — PROPOFOL 10 MG/ML IV BOLUS
INTRAVENOUS | Status: DC | PRN
  Administered 2014-01-16: 300 mg via INTRAVENOUS

## 2014-01-16 MED ORDER — CEFAZOLIN SODIUM-DEXTROSE 2-3 GM-% IV SOLR
INTRAVENOUS | Status: AC
  Filled 2014-01-16: qty 50

## 2014-01-16 MED ORDER — BUPIVACAINE HCL (PF) 0.25 % IJ SOLN
INTRAMUSCULAR | Status: DC | PRN
  Administered 2014-01-16: 1.5 mL

## 2014-01-16 MED ORDER — BUPIVACAINE HCL (PF) 0.5 % IJ SOLN
INTRAMUSCULAR | Status: AC
  Filled 2014-01-16: qty 30

## 2014-01-16 MED ORDER — OXYCODONE HCL 5 MG PO TABS
5.0000 mg | ORAL_TABLET | Freq: Once | ORAL | Status: AC
  Administered 2014-01-16: 5 mg via ORAL

## 2014-01-16 MED ORDER — LIDOCAINE HCL (CARDIAC) 20 MG/ML IV SOLN
INTRAVENOUS | Status: DC | PRN
  Administered 2014-01-16: 90 mg via INTRAVENOUS

## 2014-01-16 SURGICAL SUPPLY — 50 items
APL SKNCLS STERI-STRIP NONHPOA (GAUZE/BANDAGES/DRESSINGS)
BAG DECANTER FOR FLEXI CONT (MISCELLANEOUS) IMPLANT
BANDAGE ELASTIC 3 VELCRO ST LF (GAUZE/BANDAGES/DRESSINGS) IMPLANT
BANDAGE ELASTIC 4 VELCRO ST LF (GAUZE/BANDAGES/DRESSINGS) ×2 IMPLANT
BENZOIN TINCTURE PRP APPL 2/3 (GAUZE/BANDAGES/DRESSINGS) IMPLANT
BLADE SURG 15 STRL LF DISP TIS (BLADE) ×1 IMPLANT
BLADE SURG 15 STRL SS (BLADE) ×2
BNDG CMPR 9X4 STRL LF SNTH (GAUZE/BANDAGES/DRESSINGS) ×1
BNDG ESMARK 4X9 LF (GAUZE/BANDAGES/DRESSINGS) ×1 IMPLANT
BNDG GAUZE ELAST 4 BULKY (GAUZE/BANDAGES/DRESSINGS) ×2 IMPLANT
CORDS BIPOLAR (ELECTRODE) ×2 IMPLANT
COVER TABLE BACK 60X90 (DRAPES) ×2 IMPLANT
CUFF TOURNIQUET SINGLE 18IN (TOURNIQUET CUFF) IMPLANT
DECANTER SPIKE VIAL GLASS SM (MISCELLANEOUS) ×1 IMPLANT
DRAPE EXTREMITY TIBURON (DRAPES) ×2 IMPLANT
DRAPE SURG 17X23 STRL (DRAPES) ×2 IMPLANT
DURAPREP 26ML APPLICATOR (WOUND CARE) ×2 IMPLANT
GAUZE SPONGE 4X4 12PLY STRL (GAUZE/BANDAGES/DRESSINGS) ×2 IMPLANT
GAUZE XEROFORM 1X8 LF (GAUZE/BANDAGES/DRESSINGS) ×1 IMPLANT
GLOVE BIO SURGEON STRL SZ7.5 (GLOVE) ×1 IMPLANT
GLOVE BIOGEL PI IND STRL 8 (GLOVE) IMPLANT
GLOVE BIOGEL PI INDICATOR 8 (GLOVE) ×1
GLOVE SURG SS PI 7.0 STRL IVOR (GLOVE) ×1 IMPLANT
GLOVE SURG SYN 8.0 (GLOVE) ×4 IMPLANT
GLOVE SURG SYN 8.0 PF PI (GLOVE) ×2 IMPLANT
GOWN STRL REUS W/ TWL LRG LVL3 (GOWN DISPOSABLE) ×1 IMPLANT
GOWN STRL REUS W/TWL LRG LVL3 (GOWN DISPOSABLE) ×2
GOWN STRL REUS W/TWL XL LVL3 (GOWN DISPOSABLE) ×3 IMPLANT
NDL HYPO 25X1 1.5 SAFETY (NEEDLE) IMPLANT
NEEDLE HYPO 25X1 1.5 SAFETY (NEEDLE) ×2 IMPLANT
NS IRRIG 1000ML POUR BTL (IV SOLUTION) ×2 IMPLANT
PACK BASIN DAY SURGERY FS (CUSTOM PROCEDURE TRAY) ×2 IMPLANT
PAD CAST 3X4 CTTN HI CHSV (CAST SUPPLIES) ×1 IMPLANT
PADDING CAST COTTON 3X4 STRL (CAST SUPPLIES) ×2
SHEET MEDIUM DRAPE 40X70 STRL (DRAPES) ×3 IMPLANT
SPLINT PLASTER CAST XFAST 4X15 (CAST SUPPLIES) ×5 IMPLANT
SPLINT PLASTER XTRA FAST SET 4 (CAST SUPPLIES)
STOCKINETTE 4X48 STRL (DRAPES) ×2 IMPLANT
STRIP CLOSURE SKIN 1/2X4 (GAUZE/BANDAGES/DRESSINGS) IMPLANT
SUT ETHILON 4 0 PS 2 18 (SUTURE) ×1 IMPLANT
SUT ETHILON 5 0 PS 2 18 (SUTURE) IMPLANT
SUT PROLENE 3 0 PS 2 (SUTURE) IMPLANT
SUT VIC AB 4-0 P-3 18XBRD (SUTURE) IMPLANT
SUT VIC AB 4-0 P3 18 (SUTURE)
SUT VICRYL RAPIDE 4-0 (SUTURE) IMPLANT
SUT VICRYL RAPIDE 4/0 PS 2 (SUTURE) IMPLANT
SYR BULB 3OZ (MISCELLANEOUS) ×2 IMPLANT
SYRINGE 10CC LL (SYRINGE) ×1 IMPLANT
TOWEL OR 17X24 6PK STRL BLUE (TOWEL DISPOSABLE) ×3 IMPLANT
UNDERPAD 30X30 INCONTINENT (UNDERPADS AND DIAPERS) ×2 IMPLANT

## 2014-01-16 NOTE — Discharge Instructions (Signed)
HAND SURGERY ° °  HOME CARE INSTRUCTIONS ° ° ° °The following instructions have been prepared to help you care for yourself upon your return home today. ° °Wound Care:  °Keep your hand elevated above the level of your heart. Do not allow it to dangle by your side. Keep the dressing dry and do not remove it unless your doctor advises you to do so. He will usually change it at the time of you post-op visit. Moving your fingers is advised to stimulate circulation but will depend on the site of your surgery. Of course, if you have a splint applied your doctor will advise you about movement. ° °Activity:  °Do not drive or operate machinery today. Rest today and then you may return to your normal activity and work as indicated by your physician. ° °Diet: °Drink liquids today or eat a light diet. You may resume a regular diet tomorrow. ° °General expectations: °Pain for two or three days. °Fingers may become slightly swollen.  ° °Unexpected Observations- Call your doctor if any of these occur: °Severe pain not relieved by pain medication. °Elevated temperature. °Dressing soaked with blood. °Inability to move fingers. °White or bluish color to fingers. ° ° °Post Anesthesia Home Care Instructions ° °Activity: °Get plenty of rest for the remainder of the day. A responsible adult should stay with you for 24 hours following the procedure.  °For the next 24 hours, DO NOT: °-Drive a car °-Operate machinery °-Drink alcoholic beverages °-Take any medication unless instructed by your physician °-Make any legal decisions or sign important papers. ° °Meals: °Start with liquid foods such as gelatin or soup. Progress to regular foods as tolerated. Avoid greasy, spicy, heavy foods. If nausea and/or vomiting occur, drink only clear liquids until the nausea and/or vomiting subsides. Call your physician if vomiting continues. ° °Special Instructions/Symptoms: °Your throat may feel dry or sore from the anesthesia or the breathing tube  placed in your throat during surgery. If this causes discomfort, gargle with warm salt water. The discomfort should disappear within 24 hours. ° ° ° ° °

## 2014-01-16 NOTE — Anesthesia Preprocedure Evaluation (Signed)
Anesthesia Evaluation  Patient identified by MRN, date of birth, ID band Patient awake    Reviewed: Allergy & Precautions, H&P , NPO status , Patient's Chart, lab work & pertinent test results  Airway Mallampati: II TM Distance: >3 FB Neck ROM: Full    Dental no notable dental hx.    Pulmonary asthma ,  breath sounds clear to auscultation  Pulmonary exam normal       Cardiovascular hypertension, Pt. on medications Rhythm:Regular Rate:Normal     Neuro/Psych  Headaches, negative psych ROS   GI/Hepatic negative GI ROS, Neg liver ROS,   Endo/Other  Morbid obesity  Renal/GU negative Renal ROS     Musculoskeletal negative musculoskeletal ROS (+)   Abdominal   Peds  Hematology negative hematology ROS (+)   Anesthesia Other Findings   Reproductive/Obstetrics negative OB ROS                           Anesthesia Physical Anesthesia Plan  ASA: III  Anesthesia Plan: General   Post-op Pain Management:    Induction: Intravenous  Airway Management Planned: LMA  Additional Equipment: None  Intra-op Plan:   Post-operative Plan: Extubation in OR  Informed Consent: I have reviewed the patients History and Physical, chart, labs and discussed the procedure including the risks, benefits and alternatives for the proposed anesthesia with the patient or authorized representative who has indicated his/her understanding and acceptance.   Dental advisory given  Plan Discussed with: CRNA  Anesthesia Plan Comments:         Anesthesia Quick Evaluation

## 2014-01-16 NOTE — Transfer of Care (Signed)
Immediate Anesthesia Transfer of Care Note  Patient: Mary Hanson  Procedure(s) Performed: Procedure(s): EXCISION PYOGENIC GRANULOMA FROM RIGHT RING FINGER (Right)  Patient Location: PACU  Anesthesia Type:General  Level of Consciousness: awake, alert  and oriented  Airway & Oxygen Therapy: Patient Spontanous Breathing  Post-op Assessment: Report given to PACU RN  Post vital signs: Reviewed and stable  Complications: No apparent anesthesia complications

## 2014-01-16 NOTE — H&P (Signed)
Mary Hanson is an 38 y.o. female.   Chief Complaint: right ring finger bleeding mass HPI: as above with several week h/o enlarging bleeding mass on right ring finger  Past Medical History  Diagnosis Date  . Migraines   . Hypertension     under control with med., has been on med. x 1 yr.  . Allergy-induced asthma     prn inhaler  . Seasonal allergies   . History of anemia     no current problem, per pt.  . Pyogenic granuloma 12/2013    right ring finger    Past Surgical History  Procedure Laterality Date  . Cholecystectomy  2006 or 2007  . Cervical cone biopsy  age 45  . Dilation and evacuation  age 33    Family History  Problem Relation Age of Onset  . Diabetes type II Father   . Hypertension Father   . Hypertension Mother   . Goiter Sister 40    identical twin  . Asthma Sister   . Hypertension Sister   . Stroke Maternal Grandfather   . Diabetes Paternal Grandfather   . Heart disease Paternal Grandfather    Social History:  reports that she has never smoked. She has never used smokeless tobacco. She reports that she does not drink alcohol or use illicit drugs.  Allergies:  Allergies  Allergen Reactions  . Ivp Dye [Iodinated Diagnostic Agents] Nausea And Vomiting  . Zofran [Ondansetron Hcl] Other (See Comments)    Severe headache    Medications Prior to Admission  Medication Sig Dispense Refill  . albuterol (PROVENTIL HFA;VENTOLIN HFA) 108 (90 BASE) MCG/ACT inhaler Inhale 2 puffs into the lungs every 4 (four) hours as needed for shortness of breath (asthma).       . lisinopril-hydrochlorothiazide (PRINZIDE,ZESTORETIC) 10-12.5 MG per tablet Take 1 tablet by mouth daily.      . metroNIDAZOLE (FLAGYL) 500 MG tablet Take 1 tablet (500 mg total) by mouth 2 (two) times daily.  14 tablet  0  . Multiple Vitamin (MULTIVITAMIN) tablet Take 1 tablet by mouth daily.      . traMADol (ULTRAM) 50 MG tablet Take 50 mg by mouth every 6 (six) hours as needed for moderate pain.         Results for orders placed during the hospital encounter of 01/16/14 (from the past 48 hour(s))  BASIC METABOLIC PANEL     Status: None   Collection Time    01/14/14  4:30 PM      Result Value Ref Range   Sodium 140  137 - 147 mEq/L   Potassium 4.0  3.7 - 5.3 mEq/L   Chloride 105  96 - 112 mEq/L   CO2 27  19 - 32 mEq/L   Glucose, Bld 83  70 - 99 mg/dL   BUN 7  6 - 23 mg/dL   Creatinine, Ser 0.76  0.50 - 1.10 mg/dL   Calcium 8.4  8.4 - 10.5 mg/dL   GFR calc non Af Amer >90  >90 mL/min   GFR calc Af Amer >90  >90 mL/min   Comment: (NOTE)     The eGFR has been calculated using the CKD EPI equation.     This calculation has not been validated in all clinical situations.     eGFR's persistently <90 mL/min signify possible Chronic Kidney     Disease.   Anion gap 8  5 - 15  POCT HEMOGLOBIN-HEMACUE     Status: Abnormal   Collection  Time    01/16/14 10:49 AM      Result Value Ref Range   Hemoglobin 10.8 (*) 12.0 - 15.0 g/dL   No results found.  Review of Systems  All other systems reviewed and are negative.   Blood pressure 138/67, pulse 69, temperature 98.2 F (36.8 C), temperature source Oral, resp. rate 18, height _0  (1.676 m), weight 140.615 kg (310 lb), last menstrual period 01/09/2014, SpO2 98.00%. Physical Exam  Constitutional: She is oriented to person, place, and time. She appears well-developed and well-nourished.  HENT:  Head: Normocephalic and atraumatic.  Cardiovascular: Normal rate.   Respiratory: Effort normal.  Musculoskeletal:       Right hand: She exhibits tenderness and swelling.  Right ring finger pyogenic granuloma  Neurological: She is alert and oriented to person, place, and time.  Skin: Skin is warm.  Psychiatric: She has a normal mood and affect. Her behavior is normal. Judgment and thought content normal.     Assessment/Plan As above  Plan excision  Twala Collings A 01/16/2014, 11:16 AM

## 2014-01-16 NOTE — Anesthesia Procedure Notes (Signed)
Procedure Name: LMA Insertion Date/Time: 01/16/2014 11:34 AM Performed by: Maris Berger T Pre-anesthesia Checklist: Patient identified, Emergency Drugs available, Suction available and Patient being monitored Patient Re-evaluated:Patient Re-evaluated prior to inductionOxygen Delivery Method: Circle System Utilized Preoxygenation: Pre-oxygenation with 100% oxygen Intubation Type: IV induction Ventilation: Mask ventilation without difficulty LMA: LMA inserted LMA Size: 5.0 Number of attempts: 1 Airway Equipment and Method: bite block Placement Confirmation: positive ETCO2 Tube secured with: Tape Dental Injury: Teeth and Oropharynx as per pre-operative assessment

## 2014-01-16 NOTE — Anesthesia Postprocedure Evaluation (Signed)
Anesthesia Post Note  Patient: Mary Hanson  Procedure(s) Performed: Procedure(s) (LRB): EXCISION PYOGENIC GRANULOMA FROM RIGHT RING FINGER (Right)  Anesthesia type: General  Patient location: PACU  Post pain: Pain level controlled  Post assessment: Post-op Vital signs reviewed  Last Vitals: BP 134/77  Pulse 74  Temp(Src) 36.5 C (Oral)  Resp 15  Ht  (1.676 m)  Wt 310 lb (140.615 kg)  BMI 50.06 kg/m2  SpO2 95%  LMP 01/09/2014  Post vital signs: Reviewed  Level of consciousness: sedated  Complications: No apparent anesthesia complications \

## 2014-01-16 NOTE — Op Note (Signed)
See note [id number]

## 2014-01-19 ENCOUNTER — Encounter (HOSPITAL_BASED_OUTPATIENT_CLINIC_OR_DEPARTMENT_OTHER): Payer: Self-pay | Admitting: Orthopedic Surgery

## 2014-01-19 NOTE — Op Note (Signed)
NAMEJEIRY, BIRNBAUM NO.:  [id number]  MEDICAL RECORD NO.:  [id number]  LOCATION:                               FACILITY:  [facility]  PHYSICIAN:  Artist Pais. Oliviah Agostini, M.D.DATE OF BIRTH:  07-03-1975  DATE OF PROCEDURE:  01/16/2014 DATE OF DISCHARGE:  01/16/2014                              OPERATIVE REPORT   PREOPERATIVE DIAGNOSIS:  Pyogenic granuloma, ulnar side, proximal interphalangeal joint, right ring finger.  POSTOPERATIVE DIAGNOSIS:  Pyogenic granuloma, ulnar side, proximal interphalangeal joint, right ring finger.  PROCEDURE:  __________  SURGEONArtist Pais. Mina Marble, MD  ASSISTANT:  None.  ANESTHESIA:  General.  COMPLICATIONS:  No complications.  DRAINS:  No drains.  SPECIMENS:  One specimen sent.  DESCRIPTION OF PROCEDURE:  The patient was taken to the operating suite. After induction of adequate general anesthesia, right upper extremity was prepped and draped in usual sterile fashion.  An Esmarch was used to exsanguinate the limb.  Tourniquet was inflated to 250 mmHg.  At this point in time, a 1.5 x 1 cm mass of the ulnar side of PIP joint was carefully excised down to the stalk and removed in its entirety, sent for pathologic confirmation.  Dissection was carried down to the interval between the common extensor and the lateral band area.  We carefully dissected the remaining bits of the pyogenic granuloma out. We cauterized with bipolar cautery, irrigated, and loosely closed with 4- 0 nylon and 2 horizontal mattress sutures.  Xeroform, 4x4s, and Coban wrap was applied.  The patient tolerated the procedure well and went to the recovery room in a stable fashion.     Artist Pais Mina Marble, M.D.     MAW/MEDQ  D:  01/16/2014  T:  01/16/2014  Job:  [id number]

## 2014-01-31 ENCOUNTER — Encounter (HOSPITAL_COMMUNITY): Payer: Self-pay | Admitting: *Deleted

## 2014-01-31 ENCOUNTER — Inpatient Hospital Stay (HOSPITAL_COMMUNITY)
Admission: AD | Admit: 2014-01-31 | Discharge: 2014-01-31 | Disposition: A | Discharge status: Home / Self Care | Payer: Self-pay | Source: Ambulatory Visit | Attending: Obstetrics & Gynecology | Admitting: Obstetrics & Gynecology

## 2014-01-31 DIAGNOSIS — I10 Essential (primary) hypertension: Secondary | ICD-10-CM | POA: Insufficient documentation

## 2014-01-31 DIAGNOSIS — B3731 Acute candidiasis of vulva and vagina: Secondary | ICD-10-CM

## 2014-01-31 DIAGNOSIS — B373 Candidiasis of vulva and vagina: Secondary | ICD-10-CM | POA: Insufficient documentation

## 2014-01-31 LAB — WET PREP, GENITAL
Clue Cells Wet Prep HPF POC: NONE SEEN
TRICH WET PREP: NONE SEEN

## 2014-01-31 LAB — POCT PREGNANCY, URINE: Preg Test, Ur: NEGATIVE

## 2014-01-31 NOTE — Discharge Instructions (Signed)

## 2014-01-31 NOTE — MAU Provider Note (Signed)
History     CSN: [id number]  Arrival date and time: 01/31/14 [id number]   First Provider Initiated Contact with Patient 01/31/14 0534      No chief complaint on file.  HPI [name] is a 38 y.o. G1P0010 who presents to MAU today with complaint of vaginal discomfort. The patient was seen in MAU on 01/08/14 with similar complaint and diagnosed with BV and possible yeast vulvovaginitis. She took all but 3 pills of the course of Flagyl. She was forced to stop the antibiotic because of hand surgery. She was also on Keflex prior to that for the hand lesions. She has not taken the Diflucan yet. She states that she had intercourse on Tuesday afternoon and noted a small amount of bleeding afterwards. She states that she has a history of a vaginal tear because her boyfriend is "large." She denies abdominal pain, fever or UTI symptoms today. She does use condoms.   OB History   Grav Para Term Preterm Abortions TAB SAB Ect Mult Living   1    1 1           Past Medical History  Diagnosis Date  . Migraines   . Hypertension     under control with med., has been on med. x 1 yr.  . Allergy-induced asthma     prn inhaler  . Seasonal allergies   . History of anemia     no current problem, per pt.  . Pyogenic granuloma 12/2013    right ring finger    Past Surgical History  Procedure Laterality Date  . Cholecystectomy  2006 or 2007  . Cervical cone biopsy  age 38  . Dilation and evacuation  age 38  . Mass excision Right 01/16/2014    Procedure: EXCISION PYOGENIC GRANULOMA FROM RIGHT RING FINGER;  Surgeon: Dairl PonderMatthew Weingold, MD;  Location: [facility];  Service: Orthopedics;  Laterality: Right;    Family History  Problem Relation Age of Onset  . Diabetes type II Father   . Hypertension Father   . Hypertension Mother   . Goiter Sister [id number]    identical twin  . Asthma Sister   . Hypertension Sister   . Stroke Maternal Grandfather   . Diabetes Paternal Grandfather   . Heart  disease Paternal Grandfather     History  Substance Use Topics  . Smoking status: Never Smoker   . Smokeless tobacco: Never Used  . Alcohol Use: No    Allergies:  Allergies  Allergen Reactions  . Ivp Dye [Iodinated Diagnostic Agents] Nausea And Vomiting  . Zofran [Ondansetron Hcl] Other (See Comments)    Severe headache    Prescriptions prior to admission  Medication Sig Dispense Refill  . albuterol (PROVENTIL HFA;VENTOLIN HFA) 108 (90 BASE) MCG/ACT inhaler Inhale 2 puffs into the lungs every 4 (four) hours as needed for shortness of breath (asthma).       . lisinopril-hydrochlorothiazide (PRINZIDE,ZESTORETIC) 10-12.5 MG per tablet Take 1 tablet by mouth daily.      . metroNIDAZOLE (FLAGYL) 500 MG tablet Take 1 tablet (500 mg total) by mouth 2 (two) times daily.  14 tablet  0  . Multiple Vitamin (MULTIVITAMIN) tablet Take 1 tablet by mouth daily.      Marland Kitchen. oxyCODONE-acetaminophen (ROXICET) 5-325 MG per tablet Take 1 tablet by mouth every 4 (four) hours as needed for severe pain.  30 tablet  0  . traMADol (ULTRAM) 50 MG tablet Take 50 mg by mouth every 6 (six) hours  as needed for moderate pain.        Review of Systems  Constitutional: Negative for fever and malaise/fatigue.  Gastrointestinal: Negative for abdominal pain.  Genitourinary:       + vaginal discomfort   Physical Exam   Blood pressure 121/75, pulse 68, temperature 98 F (36.7 C), temperature source Oral, resp. rate 20, height 5\' 5"  (1.651 m), weight 307 lb (139.254 kg), last menstrual period 01/09/2014.  Physical Exam  Constitutional: She is oriented to person, place, and time. She appears well-developed and well-nourished. No distress.  HENT:  Head: Normocephalic.  Cardiovascular: Normal rate.   Respiratory: Effort normal.  GI: Soft. She exhibits no distension and no mass. There is no tenderness. There is no rebound and no guarding.  Genitourinary: There is no rash on the right labia. There is no rash on the  left labia. Cervix exhibits no motion tenderness, no discharge and no friability. No bleeding around the vagina. No signs of injury around the vagina. Vaginal discharge (moderate amount of thick, white discharge noted) found.  Neurological: She is alert and oriented to person, place, and time.  Skin: Skin is warm and dry. No erythema.  Psychiatric: She has a normal mood and affect.   Results for orders placed during the hospital encounter of 01/31/14 (from the past 24 hour(s))  POCT PREGNANCY, URINE     Status: None   Collection Time    01/31/14  5:22 AM      Result Value Ref Range   Preg Test, Ur NEGATIVE  NEGATIVE  WET PREP, GENITAL     Status: Abnormal   Collection Time    01/31/14  5:40 AM      Result Value Ref Range   Yeast Wet Prep HPF POC MODERATE (*) NONE SEEN   Trich, Wet Prep NONE SEEN  NONE SEEN   Clue Cells Wet Prep HPF POC NONE SEEN  NONE SEEN   WBC, Wet Prep HPF POC MODERATE (*) NONE SEEN    MAU Course  Procedures None  MDM UPT - negative Wet prep today  Assessment and Plan  A: Yeast vulvovaginitis  P: Discharge home Patient advised to take Rx for Diflucan given at last visit Patient encouraged to follow-[facility] as needed or if symptoms persist or worsen Patient may return to MAU as needed or if her condition were to change or worsen   Marny LowensteinJulie N Wenzel, PA-C  01/31/2014, 6:15 AM

## 2014-01-31 NOTE — MAU Note (Signed)
PT  SAYS SHE HAD SURGERY ON 9-26.     WAS ON FLAGYL   BEFORE SURGERY-  HAD  TO STOP TAKING  THEN THEN STARTED AGAIN LAST WEEK-       HAD SEX  ON Tuesday-   WITH  SMALL AMT BLEEDING-    ALSO HAS ITCHING .

## 2014-02-04 ENCOUNTER — Ambulatory Visit: Payer: Self-pay

## 2014-02-23 ENCOUNTER — Encounter (HOSPITAL_COMMUNITY): Payer: Self-pay | Admitting: *Deleted

## 2014-04-01 ENCOUNTER — Other Ambulatory Visit: Payer: Self-pay | Admitting: Family Medicine

## 2014-04-19 ENCOUNTER — Emergency Department (HOSPITAL_BASED_OUTPATIENT_CLINIC_OR_DEPARTMENT_OTHER): Payer: 59

## 2014-04-19 ENCOUNTER — Encounter (HOSPITAL_BASED_OUTPATIENT_CLINIC_OR_DEPARTMENT_OTHER): Payer: Self-pay

## 2014-04-19 ENCOUNTER — Emergency Department (HOSPITAL_BASED_OUTPATIENT_CLINIC_OR_DEPARTMENT_OTHER)
Admission: EM | Admit: 2014-04-19 | Discharge: 2014-04-19 | Disposition: A | Discharge status: Home / Self Care | Payer: 59 | Attending: Emergency Medicine | Admitting: Emergency Medicine

## 2014-04-19 DIAGNOSIS — R0602 Shortness of breath: Secondary | ICD-10-CM | POA: Diagnosis present

## 2014-04-19 DIAGNOSIS — Z872 Personal history of diseases of the skin and subcutaneous tissue: Secondary | ICD-10-CM | POA: Insufficient documentation

## 2014-04-19 DIAGNOSIS — I10 Essential (primary) hypertension: Secondary | ICD-10-CM | POA: Insufficient documentation

## 2014-04-19 DIAGNOSIS — Z79899 Other long term (current) drug therapy: Secondary | ICD-10-CM | POA: Diagnosis not present

## 2014-04-19 DIAGNOSIS — Z862 Personal history of diseases of the blood and blood-forming organs and certain disorders involving the immune mechanism: Secondary | ICD-10-CM | POA: Diagnosis not present

## 2014-04-19 DIAGNOSIS — J4521 Mild intermittent asthma with (acute) exacerbation: Secondary | ICD-10-CM | POA: Diagnosis not present

## 2014-04-19 DIAGNOSIS — J069 Acute upper respiratory infection, unspecified: Secondary | ICD-10-CM | POA: Diagnosis not present

## 2014-04-19 MED ORDER — BENZONATATE 100 MG PO CAPS: 100.0000 mg | ORAL_CAPSULE | Freq: Three times a day (TID) | ORAL | Status: DC

## 2014-04-19 MED ORDER — PREDNISONE 20 MG PO TABS: 60.0000 mg | ORAL_TABLET | Freq: Every day | ORAL | Status: DC

## 2014-04-19 MED ORDER — ALBUTEROL SULFATE HFA 108 (90 BASE) MCG/ACT IN AERS
2.0000 | INHALATION_SPRAY | RESPIRATORY_TRACT | Status: DC | PRN
  Administered 2014-04-19: 2 via RESPIRATORY_TRACT
  Filled 2014-04-19: qty 6.7

## 2014-04-19 MED ORDER — PREDNISONE 50 MG PO TABS
60.0000 mg | ORAL_TABLET | Freq: Once | ORAL | Status: AC
  Administered 2014-04-19: 60 mg via ORAL
  Filled 2014-04-19 (×2): qty 1

## 2014-04-19 MED ORDER — IPRATROPIUM-ALBUTEROL 0.5-2.5 (3) MG/3ML IN SOLN
3.0000 mL | RESPIRATORY_TRACT | Status: DC
  Administered 2014-04-19: 3 mL via RESPIRATORY_TRACT
  Filled 2014-04-19: qty 3

## 2014-04-19 NOTE — Discharge Instructions (Signed)
Asthma °Asthma is a condition of the lungs in which the airways tighten and narrow. Asthma can make it hard to breathe. Asthma cannot be cured, but medicine and lifestyle changes can help control it. Asthma may be started (triggered) by: °· Animal skin flakes (dander). °· Dust. °· Cockroaches. °· Pollen. °· Mold. °· Smoke. °· Cleaning products. °· Hair sprays or aerosol sprays. °· Paint fumes or strong smells. °· Cold air, weather changes, and winds. °· Crying or laughing hard. °· Stress. °· Certain medicines or drugs. °· Foods, such as dried fruit, potato chips, and sparkling grape juice. °· Infections or conditions (colds, flu). °· Exercise. °· Certain medical conditions or diseases. °· Exercise or tiring activities. °HOME CARE  °· Take medicine as told by your doctor. °· Use a peak flow meter as told by your doctor. A peak flow meter is a tool that measures how well the lungs are working. °· Record and keep track of the peak flow meter's readings. °· Understand and use the asthma action plan. An asthma action plan is a written plan for taking care of your asthma and treating your attacks. °· To help prevent asthma attacks: °· Do not smoke. Stay away from secondhand smoke. °· Change your heating and air conditioning filter often. °· Limit your use of fireplaces and wood stoves. °· Get rid of pests (such as roaches and mice) and their droppings. °· Throw away plants if you see mold on them. °· Clean your floors. Dust regularly. Use cleaning products that do not smell. °· Have someone vacuum when you are not home. Use a vacuum cleaner with a HEPA filter if possible. °· Replace carpet with wood, tile, or vinyl flooring. Carpet can trap animal skin flakes and dust. °· Use allergy-proof pillows, mattress covers, and box spring covers. °· Wash bed sheets and blankets every week in hot water and dry them in a dryer. °· Use blankets that are made of polyester or cotton. °· Clean bathrooms and kitchens with bleach. If  possible, have someone repaint the walls in these rooms with mold-resistant paint. Keep out of the rooms that are being cleaned and painted. °· Wash hands often. °GET HELP IF: °· You have make a whistling sound when breaking (wheeze), have shortness of breath, or have a cough even if taking medicine to prevent attacks. °· The colored mucus you cough up (sputum) is thicker than usual. °· The colored mucus you cough up changes from clear or white to yellow, green, gray, or bloody. °· You have problems from the medicine you are taking such as: °· A rash. °· Itching. °· Swelling. °· Trouble breathing. °· You need reliever medicines more than 2-3 times a week. °· Your peak flow measurement is still at 50-79% of your personal best after following the action plan for 1 hour. °· You have a fever. °GET HELP RIGHT AWAY IF:  °· You seem to be worse and are not responding to medicine during an asthma attack. °· You are short of breath even at rest. °· You get short of breath when doing very little activity. °· You have trouble eating, drinking, or talking. °· You have chest pain. °· You have a fast heartbeat. °· Your lips or fingernails start to turn blue. °· You are light-headed, dizzy, or faint. °· Your peak flow is less than 50% of your personal best. °MAKE SURE YOU:  °· Understand these instructions. °· Will watch your condition. °· Will get help right away if you   are not doing well or get worse. °Document Released: 09/27/2007 Document Revised: 08/25/2013 Document Reviewed: 11/07/2012 °ExitCare® Patient Information ©2015 [employer]. This information is not intended to replace advice given to you by your health care provider. Make sure you discuss any questions you have with your health care provider. ° °Upper Respiratory Infection, Adult °An upper respiratory infection (URI) is also known as the common cold. It is often caused by a type of germ (virus). Colds are easily spread (contagious). You can pass it to others by  kissing, coughing, sneezing, or drinking out of the same glass. Usually, you get better in 1 or 2 weeks.  °HOME CARE  °· Only take medicine as told by your doctor. °· Use a warm mist humidifier or breathe in steam from a hot shower. °· Drink enough water and fluids to keep your pee (urine) clear or pale yellow. °· Get plenty of rest. °· Return to work when your temperature is back to normal or as told by your doctor. You may use a face mask and wash your hands to stop your cold from spreading. °GET HELP RIGHT AWAY IF:  °· After the first few days, you feel you are getting worse. °· You have questions about your medicine. °· You have chills, shortness of breath, or brown or red spit (mucus). °· You have yellow or brown snot (nasal discharge) or pain in the face, especially when you bend forward. °· You have a fever, puffy (swollen) neck, pain when you swallow, or white spots in the back of your throat. °· You have a bad headache, ear pain, sinus pain, or chest pain. °· You have a high-pitched whistling sound when you breathe in and out (wheezing). °· You have a lasting cough or cough up blood. °· You have sore muscles or a stiff neck. °MAKE SURE YOU:  °· Understand these instructions. °· Will watch your condition. °· Will get help right away if you are not doing well or get worse. °Document Released: 09/27/2007 Document Revised: 07/03/2011 Document Reviewed: 07/16/2013 °ExitCare® Patient Information ©2015 [employer]. This information is not intended to replace advice given to you by your health care provider. Make sure you discuss any questions you have with your health care provider. ° °

## 2014-04-19 NOTE — ED Notes (Signed)
Patient here with complaint of wheezing and shortness of breath exacerbated by her allergies. Out of albuterol inhaler. Patient in no distress, speaking full sentences

## 2014-04-19 NOTE — ED Provider Notes (Signed)
CSN: [id number]     Arrival date & time 04/19/14  1107 History   First MD Initiated Contact with Patient 04/19/14 1137     Chief Complaint  Patient presents with  . Shortness of Breath     (Consider location/radiation/quality/duration/timing/severity/associated sxs/prior Treatment) HPI Comments: Presents to the ER for evaluation of wheezing and shortness of breath. Patient reports that she has been feeling like her allergies have been acting up all week. She started with sinus congestion and slight sore throat. She has been taking Zyrtec without much improvement. In the last 1 or 2 days she started to feel like her asthma has been acting up. She has had increased cough and wheezing. She used her inhaler, but has run out.  Patient is a 38 y.o. female presenting with shortness of breath.  Shortness of Breath Associated symptoms: wheezing     Past Medical History  Diagnosis Date  . Migraines   . Hypertension     under control with med., has been on med. x 1 yr.  . Allergy-induced asthma     prn inhaler  . Seasonal allergies   . History of anemia     no current problem, per pt.  . Pyogenic granuloma 12/2013    right ring finger   Past Surgical History  Procedure Laterality Date  . Cholecystectomy  2006 or 2007  . Cervical cone biopsy  age 89  . Dilation and evacuation  age 38  . Mass excision Right 01/16/2014    Procedure: EXCISION PYOGENIC GRANULOMA FROM RIGHT RING FINGER;  Surgeon: Dairl PonderMatthew Weingold, MD;  Location: [facility];  Service: Orthopedics;  Laterality: Right;   Family History  Problem Relation Age of Onset  . Diabetes type II Father   . Hypertension Father   . Hypertension Mother   . Goiter Sister [id number]    identical twin  . Asthma Sister   . Hypertension Sister   . Stroke Maternal Grandfather   . Diabetes Paternal Grandfather   . Heart disease Paternal Grandfather    History  Substance Use Topics  . Smoking status: Never Smoker   . Smokeless  tobacco: Never Used  . Alcohol Use: No   OB History    Gravida Para Term Preterm AB TAB SAB Ectopic Multiple Living   1    1 1          Review of Systems  HENT: Positive for congestion and sinus pressure.   Respiratory: Positive for shortness of breath and wheezing.   All other systems reviewed and are negative.     Allergies  Ivp dye and Zofran  Home Medications   Prior to Admission medications   Medication Sig Start Date End Date Taking? Authorizing Provider  albuterol (PROVENTIL HFA;VENTOLIN HFA) 108 (90 BASE) MCG/ACT inhaler Inhale 2 puffs into the lungs every 4 (four) hours as needed for shortness of breath (asthma).    Yes Historical Provider, MD  lisinopril-hydrochlorothiazide (PRINZIDE,ZESTORETIC) 10-12.5 MG per tablet TAKE 1 TABLET BY MOUTH ONCE DAILY 04/01/14   Reva Boresanya S Pratt, MD  Multiple Vitamin (MULTIVITAMIN) tablet Take 1 tablet by mouth daily.    Historical Provider, MD   BP 139/87 mmHg  Pulse 95  Temp(Src) 99.4 F (37.4 C) (Oral)  Resp 20  Ht 5\' 6"  (1.676 m)  Wt 308 lb (139.708 kg)  BMI 49.74 kg/m2  SpO2 98% Physical Exam  Constitutional: She is oriented to person, place, and time. She appears well-developed and well-nourished. No distress.  HENT:  Head: Normocephalic and atraumatic.  Right Ear: Hearing normal.  Left Ear: Hearing normal.  Nose: Nose normal.  Mouth/Throat: Oropharynx is clear and moist and mucous membranes are normal.  Eyes: Conjunctivae and EOM are normal. Pupils are equal, round, and reactive to light.  Neck: Normal range of motion. Neck supple.  Cardiovascular: Regular rhythm, S1 normal and S2 normal.  Exam reveals no gallop and no friction rub.   No murmur heard. Pulmonary/Chest: Effort normal and breath sounds normal. No respiratory distress. She exhibits no tenderness.  Abdominal: Soft. Normal appearance and bowel sounds are normal. There is no hepatosplenomegaly. There is no tenderness. There is no rebound, no guarding, no  tenderness at McBurney's point and negative Murphy's sign. No hernia.  Musculoskeletal: Normal range of motion.  Neurological: She is alert and oriented to person, place, and time. She has normal strength. No cranial nerve deficit or sensory deficit. Coordination normal. GCS eye subscore is 4. GCS verbal subscore is 5. GCS motor subscore is 6.  Skin: Skin is warm, dry and intact. No rash noted. No cyanosis.  Psychiatric: She has a normal mood and affect. Her speech is normal and behavior is normal. Thought content normal.  Nursing note and vitals reviewed.   ED Course  Procedures (including critical care time) Labs Review Labs Reviewed - No data to display  Imaging Review Dg Chest 2 View  04/19/2014   CLINICAL DATA:  Acute dry cough, low-grade fever and wheezing for 5 days  EXAM: CHEST  2 VIEW  COMPARISON:  02/22/2009  FINDINGS: The heart size and mediastinal contours are within normal limits. Both lungs are clear. The visualized skeletal structures are unremarkable.  IMPRESSION: No active cardiopulmonary disease.   Electronically Signed   By: Ruel Favorsrevor  Shick M.D.   On: 04/19/2014 11:55     EKG Interpretation None      MDM   Final diagnoses:  SOB (shortness of breath)    Presents to the ER for evaluation of cough and shortness of breath. Workup was negative. Patient treated for bronchospasm.    Gilda Creasehristopher J. Pollina, MD 04/21/14 Moses [id number]

## 2014-05-10 ENCOUNTER — Encounter (HOSPITAL_COMMUNITY): Payer: Self-pay | Admitting: *Deleted

## 2014-05-10 ENCOUNTER — Inpatient Hospital Stay (HOSPITAL_COMMUNITY)
Admission: AD | Admit: 2014-05-10 | Discharge: 2014-05-10 | Disposition: A | Discharge status: Home / Self Care | Payer: 59 | Source: Ambulatory Visit | Attending: Obstetrics & Gynecology | Admitting: Obstetrics & Gynecology

## 2014-05-10 DIAGNOSIS — I10 Essential (primary) hypertension: Secondary | ICD-10-CM | POA: Diagnosis not present

## 2014-05-10 DIAGNOSIS — N949 Unspecified condition associated with female genital organs and menstrual cycle: Secondary | ICD-10-CM | POA: Diagnosis present

## 2014-05-10 DIAGNOSIS — B3731 Acute candidiasis of vulva and vagina: Secondary | ICD-10-CM

## 2014-05-10 DIAGNOSIS — B373 Candidiasis of vulva and vagina: Secondary | ICD-10-CM | POA: Diagnosis not present

## 2014-05-10 LAB — URINALYSIS, ROUTINE W REFLEX MICROSCOPIC
Bilirubin Urine: NEGATIVE
Glucose, UA: NEGATIVE mg/dL
Ketones, ur: NEGATIVE mg/dL
Nitrite: NEGATIVE
Protein, ur: 30 mg/dL — AB
Specific Gravity, Urine: >1.03 — ABNORMAL HIGH (ref 1.005–1.030)
Urobilinogen, UA: 0.2 mg/dL (ref 0.0–1.0)
pH: 5.5 (ref 5.0–8.0)

## 2014-05-10 LAB — WET PREP, GENITAL
Clue Cells Wet Prep HPF POC: NONE SEEN
TRICH WET PREP: NONE SEEN

## 2014-05-10 LAB — URINE MICROSCOPIC-ADD ON

## 2014-05-10 LAB — POCT PREGNANCY, URINE: Preg Test, Ur: NEGATIVE

## 2014-05-10 MED ORDER — FLUCONAZOLE 150 MG PO TABS: 150.0000 mg | ORAL_TABLET | Freq: Every day | ORAL | Status: DC

## 2014-05-10 MED ORDER — FLUCONAZOLE 150 MG PO TABS
150.0000 mg | ORAL_TABLET | Freq: Once | ORAL | Status: AC
  Administered 2014-05-10: 150 mg via ORAL
  Filled 2014-05-10: qty 1

## 2014-05-10 NOTE — MAU Note (Signed)
Very uncomfortable in vaginal area last couple days. Some vag itching

## 2014-05-10 NOTE — Discharge Instructions (Signed)

## 2014-05-10 NOTE — Progress Notes (Signed)
Written and verbal d/c instructions given and understanding voiced. 

## 2014-05-10 NOTE — MAU Provider Note (Signed)
History     CSN: [id number]  Arrival date and time: 05/10/14 0134   None     Chief Complaint  Patient presents with  . Vaginal Pain   HPI Comments: Mary Hanson 39 y.o. G1P0010 presents to MAU with yeast like vaginal infection. She has noticed the symptoms of itching have gotten much worse as the evening has gone along. She feels she has had more vaginal yeast infections lately than usual. She denies diabetes.   Vaginal Pain      Past Medical History  Diagnosis Date  . Migraines   . Hypertension     under control with med., has been on med. x 1 yr.  . Allergy-induced asthma     prn inhaler  . Seasonal allergies   . History of anemia     no current problem, per pt.  . Pyogenic granuloma 12/2013    right ring finger    Past Surgical History  Procedure Laterality Date  . Cholecystectomy  2006 or 2007  . Cervical cone biopsy  age 39  . Dilation and evacuation  age 39  . Mass excision Right 01/16/2014    Procedure: EXCISION PYOGENIC GRANULOMA FROM RIGHT RING FINGER;  Surgeon: Dairl PonderMatthew Weingold, MD;  Location: [facility];  Service: Orthopedics;  Laterality: Right;  . Dilation and curettage of uterus      Family History  Problem Relation Age of Onset  . Diabetes type II Father   . Hypertension Father   . Hypertension Mother   . Goiter Sister [id number]    identical twin  . Asthma Sister   . Hypertension Sister   . Stroke Maternal Grandfather   . Diabetes Paternal Grandfather   . Heart disease Paternal Grandfather     History  Substance Use Topics  . Smoking status: Never Smoker   . Smokeless tobacco: Never Used  . Alcohol Use: No    Allergies:  Allergies  Allergen Reactions  . Ivp Dye [Iodinated Diagnostic Agents] Nausea And Vomiting  . Zofran [Ondansetron Hcl] Other (See Comments)    Severe headache    Prescriptions prior to admission  Medication Sig Dispense Refill Last Dose  . albuterol (PROVENTIL HFA;VENTOLIN HFA) 108 (90 BASE) MCG/ACT  inhaler Inhale 2 puffs into the lungs every 4 (four) hours as needed for shortness of breath (asthma).    Past Month at Unknown time  . benzonatate (TESSALON) 100 MG capsule Take 1 capsule (100 mg total) by mouth every 8 (eight) hours. 21 capsule 0 Past Week at Unknown time  . ibuprofen (ADVIL,MOTRIN) 800 MG tablet Take 800 mg by mouth every 8 (eight) hours as needed.   Past Week at Unknown time  . lisinopril-hydrochlorothiazide (PRINZIDE,ZESTORETIC) 10-12.5 MG per tablet TAKE 1 TABLET BY MOUTH ONCE DAILY 30 tablet 11 Past Week at Unknown time  . Multiple Vitamin (MULTIVITAMIN) tablet Take 1 tablet by mouth daily.   05/09/2014 at Unknown time  . predniSONE (DELTASONE) 20 MG tablet Take 3 tablets (60 mg total) by mouth daily with breakfast. 15 tablet 0     Review of Systems  Constitutional: Negative.   HENT: Negative.   Eyes: Negative.   Respiratory: Negative.   Cardiovascular: Negative.   Genitourinary: Positive for vaginal pain.       Vaginal itching  Musculoskeletal: Negative.   Skin: Negative.   Neurological: Negative.   Psychiatric/Behavioral: Negative.    Physical Exam   Blood pressure 128/74, pulse 74, temperature 98.3 F (36.8 C), resp. rate 20, last  menstrual period 04/21/2014.  Physical Exam  Constitutional: She is oriented to person, place, and time. She appears well-developed and well-nourished. No distress.  HENT:  Head: Normocephalic and atraumatic.  GI: Soft. She exhibits no distension. There is no tenderness. There is no rebound and no guarding.  Genitourinary:  Genital:external negative Vaginal:very small amount white discharge Cervix:closed/ thick Bimanual: nontender   Neurological: She is alert and oriented to person, place, and time.  Skin: Skin is warm and dry.  Psychiatric: She has a normal mood and affect. Her behavior is normal. Judgment and thought content normal.   Results for orders placed or performed during the hospital encounter of 05/10/14 (from  the past 24 hour(s))  Urinalysis, Routine w reflex microscopic     Status: Abnormal   Collection Time: 05/10/14  1:50 AM  Result Value Ref Range   Color, Urine YELLOW YELLOW   APPearance CLOUDY (A) CLEAR   Specific Gravity, Urine >1.030 (H) 1.005 - 1.030   pH 5.5 5.0 - 8.0   Glucose, UA NEGATIVE NEGATIVE mg/dL   Hgb urine dipstick LARGE (A) NEGATIVE   Bilirubin Urine NEGATIVE NEGATIVE   Ketones, ur NEGATIVE NEGATIVE mg/dL   Protein, ur 30 (A) NEGATIVE mg/dL   Urobilinogen, UA 0.2 0.0 - 1.0 mg/dL   Nitrite NEGATIVE NEGATIVE   Leukocytes, UA MODERATE (A) NEGATIVE  Urine microscopic-add on     Status: Abnormal   Collection Time: 05/10/14  1:50 AM  Result Value Ref Range   Squamous Epithelial / LPF MANY (A) RARE   WBC, UA 11-20 <3 WBC/hpf   RBC / HPF 7-10 <3 RBC/hpf   Bacteria, UA FEW (A) RARE   Urine-Other YEAST   Wet prep, genital     Status: Abnormal   Collection Time: 05/10/14  2:40 AM  Result Value Ref Range   Yeast Wet Prep HPF POC FEW (A) NONE SEEN   Trich, Wet Prep NONE SEEN NONE SEEN   Clue Cells Wet Prep HPF POC NONE SEEN NONE SEEN   WBC, Wet Prep HPF POC MODERATE (A) NONE SEEN  Pregnancy, urine POC     Status: None   Collection Time: 05/10/14  2:46 AM  Result Value Ref Range   Preg Test, Ur NEGATIVE NEGATIVE  .  MAU Course  Procedures  MDM Wet prep/ gc/chlamydia/ urine culture Diflucan 150 mg po now Pt has an appointment with Dr Shawnie Pons tomorrow for her annual exam Assessment and Plan   A: Vaginal yeast  P: Diflucan 150 mg now and rx for home Advised to use Monistat creams as needed Follow up with Dr Dorise Hiss, Rubbie Battiest 05/10/2014, 3:28 AM

## 2014-05-11 ENCOUNTER — Encounter: Payer: Self-pay | Admitting: Family Medicine

## 2014-05-11 ENCOUNTER — Ambulatory Visit (INDEPENDENT_AMBULATORY_CARE_PROVIDER_SITE_OTHER): Payer: 59 | Admitting: Family Medicine

## 2014-05-11 VITALS — BP 121/88 | HR 70 | Ht 62.0 in | Wt 316.2 lb

## 2014-05-11 DIAGNOSIS — G43009 Migraine without aura, not intractable, without status migrainosus: Secondary | ICD-10-CM

## 2014-05-11 DIAGNOSIS — Z1151 Encounter for screening for human papillomavirus (HPV): Secondary | ICD-10-CM

## 2014-05-11 DIAGNOSIS — Z124 Encounter for screening for malignant neoplasm of cervix: Secondary | ICD-10-CM

## 2014-05-11 DIAGNOSIS — J4520 Mild intermittent asthma, uncomplicated: Secondary | ICD-10-CM

## 2014-05-11 DIAGNOSIS — M791 Myalgia, unspecified site: Secondary | ICD-10-CM

## 2014-05-11 DIAGNOSIS — Z01419 Encounter for gynecological examination (general) (routine) without abnormal findings: Secondary | ICD-10-CM

## 2014-05-11 LAB — COMPREHENSIVE METABOLIC PANEL
ALK PHOS: 89 U/L (ref 39–117)
ALT: 11 U/L (ref 0–35)
AST: 12 U/L (ref 0–37)
Albumin: 3.6 g/dL (ref 3.5–5.2)
BUN: 9 mg/dL (ref 6–23)
CO2: 28 mEq/L (ref 19–32)
CREATININE: 0.77 mg/dL (ref 0.50–1.10)
Calcium: 9 mg/dL (ref 8.4–10.5)
Chloride: 102 mEq/L (ref 96–112)
Glucose, Bld: 84 mg/dL (ref 70–99)
Potassium: 3.9 mEq/L (ref 3.5–5.3)
Sodium: 137 mEq/L (ref 135–145)
Total Bilirubin: 0.3 mg/dL (ref 0.2–1.2)
Total Protein: 7.1 g/dL (ref 6.0–8.3)

## 2014-05-11 LAB — CBC
HCT: 36.1 % (ref 36.0–46.0)
Hemoglobin: 11.8 g/dL — ABNORMAL LOW (ref 12.0–15.0)
MCH: 27.3 pg (ref 26.0–34.0)
MCHC: 32.7 g/dL (ref 30.0–36.0)
MCV: 83.6 fL (ref 78.0–100.0)
MPV: 9.5 fL (ref 8.6–12.4)
Platelets: 357 10*3/uL (ref 150–400)
RBC: 4.32 MIL/uL (ref 3.87–5.11)
RDW: 14.8 % (ref 11.5–15.5)
WBC: 9.9 10*3/uL (ref 4.0–10.5)

## 2014-05-11 LAB — LIPID PANEL
Cholesterol: 123 mg/dL (ref 0–200)
HDL: 46 mg/dL (ref >39)
LDL Cholesterol: 63 mg/dL (ref 0–99)
Total CHOL/HDL Ratio: 2.7 Ratio
Triglycerides: 71 mg/dL (ref <150)
VLDL: 14 mg/dL (ref 0–40)

## 2014-05-11 LAB — GC/CHLAMYDIA PROBE AMP (~~LOC~~) NOT AT ARMC
Chlamydia: NEGATIVE
Neisseria Gonorrhea: NEGATIVE

## 2014-05-11 LAB — TSH: TSH: 2.338 u[IU]/mL (ref 0.350–4.500)

## 2014-05-11 MED ORDER — ALBUTEROL SULFATE HFA 108 (90 BASE) MCG/ACT IN AERS: 2.0000 | INHALATION_SPRAY | RESPIRATORY_TRACT | Status: DC | PRN

## 2014-05-11 MED ORDER — ELETRIPTAN HYDROBROMIDE 20 MG PO TABS: 20.0000 mg | ORAL_TABLET | ORAL | Status: DC | PRN

## 2014-05-11 NOTE — Progress Notes (Signed)
Patient is here for yearly exam, she is unsure of when her last pap was done but she was seen in [facility] the other night for vaginal pain, that was diagnosed as a yeast infection in [facility].

## 2014-05-11 NOTE — Patient Instructions (Signed)
Preventive Care for Adults A healthy lifestyle and preventive care can promote health and wellness. Preventive health guidelines for women include the following key practices.  A routine yearly physical is a good way to check with your health care provider about your health and preventive screening. It is a chance to share any concerns and updates on your health and to receive a thorough exam.  Visit your dentist for a routine exam and preventive care every 6 months. Brush your teeth twice a day and floss once a day. Good oral hygiene prevents tooth decay and gum disease.  The frequency of eye exams is based on your age, health, family medical history, use of contact lenses, and other factors. Follow your health care provider's recommendations for frequency of eye exams.  Eat a healthy diet. Foods like vegetables, fruits, whole grains, low-fat dairy products, and lean protein foods contain the nutrients you need without too many calories. Decrease your intake of foods high in solid fats, added sugars, and salt. Eat the right amount of calories for you.Get information about a proper diet from your health care provider, if necessary.  Regular physical exercise is one of the most important things you can do for your health. Most adults should get at least 150 minutes of moderate-intensity exercise (any activity that increases your heart rate and causes you to sweat) each week. In addition, most adults need muscle-strengthening exercises on 2 or more days a week.  Maintain a healthy weight. The body mass index (BMI) is a screening tool to identify possible weight problems. It provides an estimate of body fat based on height and weight. Your health care provider can find your BMI and can help you achieve or maintain a healthy weight.For adults 20 years and older:  A BMI below 18.5 is considered underweight.  A BMI of 18.5 to 24.9 is normal.  A BMI of 25 to 29.9 is considered overweight.  A BMI of  30 and above is considered obese.  Maintain normal blood lipids and cholesterol levels by exercising and minimizing your intake of saturated fat. Eat a balanced diet with plenty of fruit and vegetables. Blood tests for lipids and cholesterol should begin at age 76 and be repeated every 5 years. If your lipid or cholesterol levels are high, you are over 50, or you are at high risk for heart disease, you may need your cholesterol levels checked more frequently.Ongoing high lipid and cholesterol levels should be treated with medicines if diet and exercise are not working.  If you smoke, find out from your health care provider how to quit. If you do not use tobacco, do not start.  Lung cancer screening is recommended for adults aged 22-80 years who are at high risk for developing lung cancer because of a history of smoking. A yearly low-dose CT scan of the lungs is recommended for people who have at least a 30-pack-year history of smoking and are a current smoker or have quit within the past 15 years. A pack year of smoking is smoking an average of 1 pack of cigarettes a day for 1 year (for example: 1 pack a day for 30 years or 2 packs a day for 15 years). Yearly screening should continue until the smoker has stopped smoking for at least 15 years. Yearly screening should be stopped for people who develop a health problem that would prevent them from having lung cancer treatment.  If you are pregnant, do not drink alcohol. If you are breastfeeding,  be very cautious about drinking alcohol. If you are not pregnant and choose to drink alcohol, do not have more than 1 drink per day. One drink is considered to be 12 ounces (355 mL) of beer, 5 ounces (148 mL) of wine, or 1.5 ounces (44 mL) of liquor.  Avoid use of street drugs. Do not share needles with anyone. Ask for help if you need support or instructions about stopping the use of drugs.  High blood pressure causes heart disease and increases the risk of  stroke. Your blood pressure should be checked at least every 1 to 2 years. Ongoing high blood pressure should be treated with medicines if weight loss and exercise do not work.  If you are 3-86 years old, ask your health care provider if you should take aspirin to prevent strokes.  Diabetes screening involves taking a blood sample to check your fasting blood sugar level. This should be done once every 3 years, after age 67, if you are within normal weight and without risk factors for diabetes. Testing should be considered at a younger age or be carried out more frequently if you are overweight and have at least 1 risk factor for diabetes.  Breast cancer screening is essential preventive care for women. You should practice "breast self-awareness." This means understanding the normal appearance and feel of your breasts and may include breast self-examination. Any changes detected, no matter how small, should be reported to a health care provider. Women in their 8s and 30s should have a clinical breast exam (CBE) by a health care provider as part of a regular health exam every 1 to 3 years. After age 70, women should have a CBE every year. Starting at age 25, women should consider having a mammogram (breast X-ray test) every year. Women who have a family history of breast cancer should talk to their health care provider about genetic screening. Women at a high risk of breast cancer should talk to their health care providers about having an MRI and a mammogram every year.  Breast cancer gene (BRCA)-related cancer risk assessment is recommended for women who have family members with BRCA-related cancers. BRCA-related cancers include breast, ovarian, tubal, and peritoneal cancers. Having family members with these cancers may be associated with an increased risk for harmful changes (mutations) in the breast cancer genes BRCA1 and BRCA2. Results of the assessment will determine the need for genetic counseling and  BRCA1 and BRCA2 testing.  Routine pelvic exams to screen for cancer are no longer recommended for nonpregnant women who are considered low risk for cancer of the pelvic organs (ovaries, uterus, and vagina) and who do not have symptoms. Ask your health care provider if a screening pelvic exam is right for you.  If you have had past treatment for cervical cancer or a condition that could lead to cancer, you need Pap tests and screening for cancer for at least 20 years after your treatment. If Pap tests have been discontinued, your risk factors (such as having a new sexual partner) need to be reassessed to determine if screening should be resumed. Some women have medical problems that increase the chance of getting cervical cancer. In these cases, your health care provider may recommend more frequent screening and Pap tests.  The HPV test is an additional test that may be used for cervical cancer screening. The HPV test looks for the virus that can cause the cell changes on the cervix. The cells collected during the Pap test can be  tested for HPV. The HPV test could be used to screen women aged 30 years and older, and should be used in women of any age who have unclear Pap test results. After the age of 30, women should have HPV testing at the same frequency as a Pap test.  Colorectal cancer can be detected and often prevented. Most routine colorectal cancer screening begins at the age of 50 years and continues through age 75 years. However, your health care provider may recommend screening at an earlier age if you have risk factors for colon cancer. On a yearly basis, your health care provider may provide home test kits to check for hidden blood in the stool. Use of a small camera at the end of a tube, to directly examine the colon (sigmoidoscopy or colonoscopy), can detect the earliest forms of colorectal cancer. Talk to your health care provider about this at age 50, when routine screening begins. Direct  exam of the colon should be repeated every 5-10 years through age 75 years, unless early forms of pre-cancerous polyps or small growths are found.  People who are at an increased risk for hepatitis B should be screened for this virus. You are considered at high risk for hepatitis B if:  You were born in a country where hepatitis B occurs often. Talk with your health care provider about which countries are considered high risk.  Your parents were born in a high-risk country and you have not received a shot to protect against hepatitis B (hepatitis B vaccine).  You have HIV or AIDS.  You use needles to inject street drugs.  You live with, or have sex with, someone who has hepatitis B.  You get hemodialysis treatment.  You take certain medicines for conditions like cancer, organ transplantation, and autoimmune conditions.  Hepatitis C blood testing is recommended for all people born from 1945 through 1965 and any individual with known risks for hepatitis C.  Practice safe sex. Use condoms and avoid high-risk sexual practices to reduce the spread of sexually transmitted infections (STIs). STIs include gonorrhea, chlamydia, syphilis, trichomonas, herpes, HPV, and human immunodeficiency virus (HIV). Herpes, HIV, and HPV are viral illnesses that have no cure. They can result in disability, cancer, and death.  You should be screened for sexually transmitted illnesses (STIs) including gonorrhea and chlamydia if:  You are sexually active and are younger than 24 years.  You are older than 24 years and your health care provider tells you that you are at risk for this type of infection.  Your sexual activity has changed since you were last screened and you are at an increased risk for chlamydia or gonorrhea. Ask your health care provider if you are at risk.  If you are at risk of being infected with HIV, it is recommended that you take a prescription medicine daily to prevent HIV infection. This is  called preexposure prophylaxis (PrEP). You are considered at risk if:  You are a heterosexual woman, are sexually active, and are at increased risk for HIV infection.  You take drugs by injection.  You are sexually active with a partner who has HIV.  Talk with your health care provider about whether you are at high risk of being infected with HIV. If you choose to begin PrEP, you should first be tested for HIV. You should then be tested every 3 months for as long as you are taking PrEP.  Osteoporosis is a disease in which the bones lose minerals and strength   with aging. This can result in serious bone fractures or breaks. The risk of osteoporosis can be identified using a bone density scan. Women ages 65 years and over and women at risk for fractures or osteoporosis should discuss screening with their health care providers. Ask your health care provider whether you should take a calcium supplement or vitamin D to reduce the rate of osteoporosis.  Menopause can be associated with physical symptoms and risks. Hormone replacement therapy is available to decrease symptoms and risks. You should talk to your health care provider about whether hormone replacement therapy is right for you.  Use sunscreen. Apply sunscreen liberally and repeatedly throughout the day. You should seek shade when your shadow is shorter than you. Protect yourself by wearing long sleeves, pants, a wide-brimmed hat, and sunglasses year round, whenever you are outdoors.  Once a month, do a whole body skin exam, using a mirror to look at the skin on your back. Tell your health care provider of new moles, moles that have irregular borders, moles that are larger than a pencil eraser, or moles that have changed in shape or color.  Stay current with required vaccines (immunizations).  Influenza vaccine. All adults should be immunized every year.  Tetanus, diphtheria, and acellular pertussis (Td, Tdap) vaccine. Pregnant women should  receive 1 dose of Tdap vaccine during each pregnancy. The dose should be obtained regardless of the length of time since the last dose. Immunization is preferred during the 27th-36th week of gestation. An adult who has not previously received Tdap or who does not know her vaccine status should receive 1 dose of Tdap. This initial dose should be followed by tetanus and diphtheria toxoids (Td) booster doses every 10 years. Adults with an unknown or incomplete history of completing a 3-dose immunization series with Td-containing vaccines should begin or complete a primary immunization series including a Tdap dose. Adults should receive a Td booster every 10 years.  Varicella vaccine. An adult without evidence of immunity to varicella should receive 2 doses or a second dose if she has previously received 1 dose. Pregnant females who do not have evidence of immunity should receive the first dose after pregnancy. This first dose should be obtained before leaving the health care facility. The second dose should be obtained 4-8 weeks after the first dose.  Human papillomavirus (HPV) vaccine. Females aged 13-26 years who have not received the vaccine previously should obtain the 3-dose series. The vaccine is not recommended for use in pregnant females. However, pregnancy testing is not needed before receiving a dose. If a female is found to be pregnant after receiving a dose, no treatment is needed. In that case, the remaining doses should be delayed until after the pregnancy. Immunization is recommended for any person with an immunocompromised condition through the age of 26 years if she did not get any or all doses earlier. During the 3-dose series, the second dose should be obtained 4-8 weeks after the first dose. The third dose should be obtained 24 weeks after the first dose and 16 weeks after the second dose.  Zoster vaccine. One dose is recommended for adults aged 60 years or older unless certain conditions are  present.  Measles, mumps, and rubella (MMR) vaccine. Adults born before 1957 generally are considered immune to measles and mumps. Adults born in 1957 or later should have 1 or more doses of MMR vaccine unless there is a contraindication to the vaccine or there is laboratory evidence of immunity to   each of the three diseases. A routine second dose of MMR vaccine should be obtained at least 28 days after the first dose for students attending postsecondary schools, health care workers, or international travelers. People who received inactivated measles vaccine or an unknown type of measles vaccine during 1963-1967 should receive 2 doses of MMR vaccine. People who received inactivated mumps vaccine or an unknown type of mumps vaccine before 1979 and are at high risk for mumps infection should consider immunization with 2 doses of MMR vaccine. For females of childbearing age, rubella immunity should be determined. If there is no evidence of immunity, females who are not pregnant should be vaccinated. If there is no evidence of immunity, females who are pregnant should delay immunization until after pregnancy. Unvaccinated health care workers born before 1957 who lack laboratory evidence of measles, mumps, or rubella immunity or laboratory confirmation of disease should consider measles and mumps immunization with 2 doses of MMR vaccine or rubella immunization with 1 dose of MMR vaccine.  Pneumococcal 13-valent conjugate (PCV13) vaccine. When indicated, a person who is uncertain of her immunization history and has no record of immunization should receive the PCV13 vaccine. An adult aged 19 years or older who has certain medical conditions and has not been previously immunized should receive 1 dose of PCV13 vaccine. This PCV13 should be followed with a dose of pneumococcal polysaccharide (PPSV23) vaccine. The PPSV23 vaccine dose should be obtained at least 8 weeks after the dose of PCV13 vaccine. An adult aged 19  years or older who has certain medical conditions and previously received 1 or more doses of PPSV23 vaccine should receive 1 dose of PCV13. The PCV13 vaccine dose should be obtained 1 or more years after the last PPSV23 vaccine dose.  Pneumococcal polysaccharide (PPSV23) vaccine. When PCV13 is also indicated, PCV13 should be obtained first. All adults aged 65 years and older should be immunized. An adult younger than age 65 years who has certain medical conditions should be immunized. Any person who resides in a nursing home or long-term care facility should be immunized. An adult smoker should be immunized. People with an immunocompromised condition and certain other conditions should receive both PCV13 and PPSV23 vaccines. People with human immunodeficiency virus (HIV) infection should be immunized as soon as possible after diagnosis. Immunization during chemotherapy or radiation therapy should be avoided. Routine use of PPSV23 vaccine is not recommended for American Indians, [place] Natives, or people younger than 65 years unless there are medical conditions that require PPSV23 vaccine. When indicated, people who have unknown immunization and have no record of immunization should receive PPSV23 vaccine. One-time revaccination 5 years after the first dose of PPSV23 is recommended for people aged 19-64 years who have chronic kidney failure, nephrotic syndrome, asplenia, or immunocompromised conditions. People who received 1-2 doses of PPSV23 before age 65 years should receive another dose of PPSV23 vaccine at age 65 years or later if at least 5 years have passed since the previous dose. Doses of PPSV23 are not needed for people immunized with PPSV23 at or after age 65 years.  Meningococcal vaccine. Adults with asplenia or persistent complement component deficiencies should receive 2 doses of quadrivalent meningococcal conjugate (MenACWY-D) vaccine. The doses should be obtained at least 2 months apart.  Microbiologists working with certain meningococcal bacteria, military recruits, people at risk during an outbreak, and people who travel to or live in countries with a high rate of meningitis should be immunized. A first-year college student up through age   21 years who is living in a residence hall should receive a dose if she did not receive a dose on or after her 16th birthday. Adults who have certain high-risk conditions should receive one or more doses of vaccine.  Hepatitis A vaccine. Adults who wish to be protected from this disease, have certain high-risk conditions, work with hepatitis A-infected animals, work in hepatitis A research labs, or travel to or work in countries with a high rate of hepatitis A should be immunized. Adults who were previously unvaccinated and who anticipate close contact with an international adoptee during the first 60 days after arrival in the [place] from a country with a high rate of hepatitis A should be immunized.  Hepatitis B vaccine. Adults who wish to be protected from this disease, have certain high-risk conditions, may be exposed to blood or other infectious body fluids, are household contacts or sex partners of hepatitis B positive people, are clients or workers in certain care facilities, or travel to or work in countries with a high rate of hepatitis B should be immunized.  Haemophilus influenzae type b (Hib) vaccine. A previously unvaccinated person with asplenia or sickle cell disease or having a scheduled splenectomy should receive 1 dose of Hib vaccine. Regardless of previous immunization, a recipient of a hematopoietic stem cell transplant should receive a 3-dose series 6-12 months after her successful transplant. Hib vaccine is not recommended for adults with HIV infection. Preventive Services / Frequency Ages 64 to 68 years  Blood pressure check.** / Every 1 to 2 years.  Lipid and cholesterol check.** / Every 5 years beginning at age  22.  Clinical breast exam.** / Every 3 years for women in their 88s and 53s.  BRCA-related cancer risk assessment.** / For women who have family members with a BRCA-related cancer (breast, ovarian, tubal, or peritoneal cancers).  Pap test.** / Every 2 years from ages 90 through 51. Every 3 years starting at age 21 through age 56 or 3 with a history of 3 consecutive normal Pap tests.  HPV screening.** / Every 3 years from ages 24 through ages 1 to 46 with a history of 3 consecutive normal Pap tests.  Hepatitis C blood test.** / For any individual with known risks for hepatitis C.  Skin self-exam. / Monthly.  Influenza vaccine. / Every year.  Tetanus, diphtheria, and acellular pertussis (Tdap, Td) vaccine.** / Consult your health care provider. Pregnant women should receive 1 dose of Tdap vaccine during each pregnancy. 1 dose of Td every 10 years.  Varicella vaccine.** / Consult your health care provider. Pregnant females who do not have evidence of immunity should receive the first dose after pregnancy.  HPV vaccine. / 3 doses over 6 months, if 72 and younger. The vaccine is not recommended for use in pregnant females. However, pregnancy testing is not needed before receiving a dose.  Measles, mumps, rubella (MMR) vaccine.** / You need at least 1 dose of MMR if you were born in 1957 or later. You may also need a 2nd dose. For females of childbearing age, rubella immunity should be determined. If there is no evidence of immunity, females who are not pregnant should be vaccinated. If there is no evidence of immunity, females who are pregnant should delay immunization until after pregnancy.  Pneumococcal 13-valent conjugate (PCV13) vaccine.** / Consult your health care provider.  Pneumococcal polysaccharide (PPSV23) vaccine.** / 1 to 2 doses if you smoke cigarettes or if you have certain conditions.  Meningococcal vaccine.** /  1 dose if you are age 19 to 21 years and a first-year college  student living in a residence hall, or have one of several medical conditions, you need to get vaccinated against meningococcal disease. You may also need additional booster doses.  Hepatitis A vaccine.** / Consult your health care provider.  Hepatitis B vaccine.** / Consult your health care provider.  Haemophilus influenzae type b (Hib) vaccine.** / Consult your health care provider. Ages 40 to 64 years  Blood pressure check.** / Every 1 to 2 years.  Lipid and cholesterol check.** / Every 5 years beginning at age 20 years.  Lung cancer screening. / Every year if you are aged 55-80 years and have a 30-pack-year history of smoking and currently smoke or have quit within the past 15 years. Yearly screening is stopped once you have quit smoking for at least 15 years or develop a health problem that would prevent you from having lung cancer treatment.  Clinical breast exam.** / Every year after age 40 years.  BRCA-related cancer risk assessment.** / For women who have family members with a BRCA-related cancer (breast, ovarian, tubal, or peritoneal cancers).  Mammogram.** / Every year beginning at age 40 years and continuing for as long as you are in good health. Consult with your health care provider.  Pap test.** / Every 3 years starting at age 30 years through age 65 or 70 years with a history of 3 consecutive normal Pap tests.  HPV screening.** / Every 3 years from ages 30 years through ages 65 to 70 years with a history of 3 consecutive normal Pap tests.  Fecal occult blood test (FOBT) of stool. / Every year beginning at age 50 years and continuing until age 75 years. You may not need to do this test if you get a colonoscopy every 10 years.  Flexible sigmoidoscopy or colonoscopy.** / Every 5 years for a flexible sigmoidoscopy or every 10 years for a colonoscopy beginning at age 50 years and continuing until age 75 years.  Hepatitis C blood test.** / For all people born from 1945 through  1965 and any individual with known risks for hepatitis C.  Skin self-exam. / Monthly.  Influenza vaccine. / Every year.  Tetanus, diphtheria, and acellular pertussis (Tdap/Td) vaccine.** / Consult your health care provider. Pregnant women should receive 1 dose of Tdap vaccine during each pregnancy. 1 dose of Td every 10 years.  Varicella vaccine.** / Consult your health care provider. Pregnant females who do not have evidence of immunity should receive the first dose after pregnancy.  Zoster vaccine.** / 1 dose for adults aged 60 years or older.  Measles, mumps, rubella (MMR) vaccine.** / You need at least 1 dose of MMR if you were born in 1957 or later. You may also need a 2nd dose. For females of childbearing age, rubella immunity should be determined. If there is no evidence of immunity, females who are not pregnant should be vaccinated. If there is no evidence of immunity, females who are pregnant should delay immunization until after pregnancy.  Pneumococcal 13-valent conjugate (PCV13) vaccine.** / Consult your health care provider.  Pneumococcal polysaccharide (PPSV23) vaccine.** / 1 to 2 doses if you smoke cigarettes or if you have certain conditions.  Meningococcal vaccine.** / Consult your health care provider.  Hepatitis A vaccine.** / Consult your health care provider.  Hepatitis B vaccine.** / Consult your health care provider.  Haemophilus influenzae type b (Hib) vaccine.** / Consult your health care provider. Ages 65   years and over  Blood pressure check.** / Every 1 to 2 years.  Lipid and cholesterol check.** / Every 5 years beginning at age 22 years.  Lung cancer screening. / Every year if you are aged 73-80 years and have a 30-pack-year history of smoking and currently smoke or have quit within the past 15 years. Yearly screening is stopped once you have quit smoking for at least 15 years or develop a health problem that would prevent you from having lung cancer  treatment.  Clinical breast exam.** / Every year after age 4 years.  BRCA-related cancer risk assessment.** / For women who have family members with a BRCA-related cancer (breast, ovarian, tubal, or peritoneal cancers).  Mammogram.** / Every year beginning at age 40 years and continuing for as long as you are in good health. Consult with your health care provider.  Pap test.** / Every 3 years starting at age 9 years through age 34 or 91 years with 3 consecutive normal Pap tests. Testing can be stopped between 65 and 70 years with 3 consecutive normal Pap tests and no abnormal Pap or HPV tests in the past 10 years.  HPV screening.** / Every 3 years from ages 57 years through ages 64 or 45 years with a history of 3 consecutive normal Pap tests. Testing can be stopped between 65 and 70 years with 3 consecutive normal Pap tests and no abnormal Pap or HPV tests in the past 10 years.  Fecal occult blood test (FOBT) of stool. / Every year beginning at age 15 years and continuing until age 17 years. You may not need to do this test if you get a colonoscopy every 10 years.  Flexible sigmoidoscopy or colonoscopy.** / Every 5 years for a flexible sigmoidoscopy or every 10 years for a colonoscopy beginning at age 86 years and continuing until age 71 years.  Hepatitis C blood test.** / For all people born from [birth year] through 1965 and any individual with known risks for hepatitis C.  Osteoporosis screening.** / A one-time screening for women ages 83 years and over and women at risk for fractures or osteoporosis.  Skin self-exam. / Monthly.  Influenza vaccine. / Every year.  Tetanus, diphtheria, and acellular pertussis (Tdap/Td) vaccine.** / 1 dose of Td every 10 years.  Varicella vaccine.** / Consult your health care provider.  Zoster vaccine.** / 1 dose for adults aged 61 years or older.  Pneumococcal 13-valent conjugate (PCV13) vaccine.** / Consult your health care provider.  Pneumococcal  polysaccharide (PPSV23) vaccine.** / 1 dose for all adults aged 28 years and older.  Meningococcal vaccine.** / Consult your health care provider.  Hepatitis A vaccine.** / Consult your health care provider.  Hepatitis B vaccine.** / Consult your health care provider.  Haemophilus influenzae type b (Hib) vaccine.** / Consult your health care provider. ** Family history and personal history of risk and conditions may change your health care provider's recommendations. Document Released: 06/06/2001 Document Revised: 08/25/2013 Document Reviewed: 09/05/2010 [facility] Patient Information 2015 [place], [place]. This information is not intended to replace advice given to you by your health care provider. Make sure you discuss any questions you have with your health care provider.

## 2014-05-11 NOTE — Progress Notes (Signed)
Subjective:     Mary Hanson is a 39 y.o. female and is here for a comprehensive physical exam. The patient reports no problems. Regular cycles.  Condoms for birth control.  Needs refill of asthma medication and headache meds.  Taking her anti-hypertensives.Treated for yeast in the last week.  Still with some symptoms.  History   Social History  . Marital Status: Single    Spouse Name: N/A    Number of Children: N/A  . Years of Education: N/A   Occupational History  . Not on file.   Social History Main Topics  . Smoking status: Never Smoker   . Smokeless tobacco: Never Used  . Alcohol Use: No  . Drug Use: No  . Sexual Activity: Yes    Birth Control/ Protection: Pill, Condom     Comment: quit BCP whils on megace 7/14   Other Topics Concern  . Not on file   Social History Narrative   Health Maintenance  Topic Date Due  . PAP SMEAR  10/26/2013  . INFLUENZA VACCINE  07/23/2014 (Originally 11/22/2013)  . TETANUS/TDAP  11/15/2019    The following portions of the patient's history were reviewed and updated as appropriate: allergies, current medications, past family history, past medical history, past social history, past surgical history and problem list.  Review of Systems A comprehensive review of systems was negative.   Objective:    BP 121/88 mmHg  Pulse 70  Ht  (1.575 m)  Wt 316 lb 3.2 oz (143.427 kg)  BMI 57.82 kg/m2  LMP 04/21/2014 General appearance: alert, cooperative, appears stated age and morbidly obese Neck: no adenopathy, supple, symmetrical, trachea midline and thyroid not enlarged, symmetric, no tenderness/mass/nodules Lungs: clear to auscultation bilaterally Breasts: normal appearance, no masses or tenderness, Normal to palpation without dominant masses Heart: regular rate and rhythm, S1, S2 normal, no murmur, click, rub or gallop Abdomen: soft, non-tender; bowel sounds normal; no masses,  no organomegaly Pelvic: cervix normal in appearance,  external genitalia normal, no adnexal masses or tenderness, no cervical motion tenderness, uterus normal size, shape, and consistency, vagina normal without discharge and exam limited by body habitus Extremities: extremities normal, atraumatic, no cyanosis or edema Pulses: 2+ and symmetric Skin: Skin color, texture, turgor normal. No rashes or lesions Lymph nodes: Cervical, supraclavicular, and axillary nodes normal. Neurologic: Grossly normal    Assessment:    Healthy female exam. Doing well.     Plan:   Problem List Items Addressed This Visit      Unprioritized   Asthma   Relevant Medications   albuterol (PROVENTIL HFA;VENTOLIN HFA) 108 (90 BASE) MCG/ACT inhaler   Migraine headache   Relevant Medications   eletriptan (RELPAX) tablet    Other Visit Diagnoses    Encounter for routine gynecological examination    -  Primary    Relevant Orders    TSH    Comprehensive metabolic panel    Lipid panel    CBC    Cytology - PAP    Vit D  25 hydroxy (rtn osteoporosis monitoring)    Mild intermittent asthma, uncomplicated        Relevant Medications    albuterol (PROVENTIL HFA;VENTOLIN HFA) 108 (90 BASE) MCG/ACT inhaler    Muscle ache        Relevant Orders    Vit D  25 hydroxy (rtn osteoporosis monitoring)    Screening for malignant neoplasm of cervix        Relevant Orders    Cytology -  PAP         See After Visit Summary for Counseling Recommendations

## 2014-05-12 LAB — VITAMIN D 25 HYDROXY (VIT D DEFICIENCY, FRACTURES): VIT D 25 HYDROXY: 31 ng/mL (ref 30–100)

## 2014-05-13 ENCOUNTER — Telehealth: Payer: Self-pay | Admitting: *Deleted

## 2014-05-13 ENCOUNTER — Encounter: Payer: Self-pay | Admitting: *Deleted

## 2014-05-13 LAB — URINE CULTURE: Special Requests: NORMAL

## 2014-05-13 LAB — CYTOLOGY - PAP

## 2014-05-13 NOTE — Telephone Encounter (Signed)
Patient called saying that she was having significant low right side pelvic pain that started Sunday and doesn't seem to be subsiding.  She wasn't sure but feels like it may be a cyst on her ovary.  I have advised her to monitor the pain, utilize ibuprofen and heating pad and if she feels like it is severe enough proceed to the emergency room to be evaluated.  She agrees and will call us back if her symptoms change.

## 2014-05-14 ENCOUNTER — Telehealth: Payer: Self-pay | Admitting: Advanced Practice Midwife

## 2014-05-14 DIAGNOSIS — N390 Urinary tract infection, site not specified: Secondary | ICD-10-CM

## 2014-05-14 MED ORDER — PENICILLIN V POTASSIUM 500 MG PO TABS: 500.0000 mg | ORAL_TABLET | Freq: Two times a day (BID) | ORAL | Status: DC

## 2014-05-14 NOTE — Telephone Encounter (Signed)
Returned call. Informed of UTI Dx and ABX Rx.

## 2014-05-14 NOTE — Telephone Encounter (Signed)
Urine culture pos for Group A Strep. Rx Pen V.

## 2014-08-03 ENCOUNTER — Emergency Department (HOSPITAL_COMMUNITY)
Admission: EM | Admit: 2014-08-03 | Discharge: 2014-08-03 | Disposition: A | Discharge status: Home / Self Care | Payer: 59 | Attending: Emergency Medicine | Admitting: Emergency Medicine

## 2014-08-03 ENCOUNTER — Encounter (HOSPITAL_COMMUNITY): Payer: Self-pay | Admitting: Emergency Medicine

## 2014-08-03 DIAGNOSIS — Z862 Personal history of diseases of the blood and blood-forming organs and certain disorders involving the immune mechanism: Secondary | ICD-10-CM | POA: Insufficient documentation

## 2014-08-03 DIAGNOSIS — Z79899 Other long term (current) drug therapy: Secondary | ICD-10-CM | POA: Insufficient documentation

## 2014-08-03 DIAGNOSIS — Z3202 Encounter for pregnancy test, result negative: Secondary | ICD-10-CM | POA: Insufficient documentation

## 2014-08-03 DIAGNOSIS — Z792 Long term (current) use of antibiotics: Secondary | ICD-10-CM | POA: Insufficient documentation

## 2014-08-03 DIAGNOSIS — Z872 Personal history of diseases of the skin and subcutaneous tissue: Secondary | ICD-10-CM | POA: Insufficient documentation

## 2014-08-03 DIAGNOSIS — G43809 Other migraine, not intractable, without status migrainosus: Secondary | ICD-10-CM | POA: Diagnosis not present

## 2014-08-03 DIAGNOSIS — J45909 Unspecified asthma, uncomplicated: Secondary | ICD-10-CM | POA: Diagnosis not present

## 2014-08-03 DIAGNOSIS — G43909 Migraine, unspecified, not intractable, without status migrainosus: Secondary | ICD-10-CM | POA: Diagnosis present

## 2014-08-03 DIAGNOSIS — I10 Essential (primary) hypertension: Secondary | ICD-10-CM | POA: Insufficient documentation

## 2014-08-03 LAB — POC URINE PREG, ED: PREG TEST UR: NEGATIVE

## 2014-08-03 MED ORDER — DEXAMETHASONE SODIUM PHOSPHATE 10 MG/ML IJ SOLN
10.0000 mg | Freq: Once | INTRAMUSCULAR | Status: AC
  Administered 2014-08-03: 10 mg via INTRAVENOUS
  Filled 2014-08-03: qty 1

## 2014-08-03 MED ORDER — ELETRIPTAN HYDROBROMIDE 20 MG PO TABS: 20.0000 mg | ORAL_TABLET | ORAL | Status: DC | PRN

## 2014-08-03 MED ORDER — DIPHENHYDRAMINE HCL 50 MG/ML IJ SOLN
25.0000 mg | Freq: Once | INTRAMUSCULAR | Status: AC
  Administered 2014-08-03: 25 mg via INTRAVENOUS
  Filled 2014-08-03: qty 1

## 2014-08-03 MED ORDER — METOCLOPRAMIDE HCL 5 MG/ML IJ SOLN
10.0000 mg | Freq: Once | INTRAMUSCULAR | Status: AC
  Administered 2014-08-03: 10 mg via INTRAVENOUS
  Filled 2014-08-03: qty 2

## 2014-08-03 NOTE — ED Provider Notes (Signed)
CSN: [id number]     Arrival date & time 08/03/14  1331 History   First MD Initiated Contact with Patient 08/03/14 1342     Chief Complaint  Patient presents with  . Migraine     (Consider location/radiation/quality/duration/timing/severity/associated sxs/prior Treatment) HPI Comments: Patient with a history of Migraines presents today with a frontal headache.  She states that the headache has been intermittent over the past 4 days.  She has taken Excedrin Migraine, Oxycodone, and Tramadol for the pain.  She states that the headache improves with medication, but then returns.  She states that headache is similar to Migraine Headaches that she has had in the past.  She reports some associated dizziness with the headache today, which she reports that she has had with Migraines in the past.  She denies head injury or trauma.  Denies fever, chills, syncope, vision changes, neck pain/stiffness, numbness, tingling, or weakness.    The history is provided by the patient.    Past Medical History  Diagnosis Date  . Migraines   . Hypertension     under control with med., has been on med. x 1 yr.  . Allergy-induced asthma     prn inhaler  . Seasonal allergies   . History of anemia     no current problem, per pt.  . Pyogenic granuloma 12/2013    right ring finger   Past Surgical History  Procedure Laterality Date  . Cholecystectomy  2006 or 2007  . Cervical cone biopsy  age 39  . Dilation and evacuation  age 39  . Mass excision Right 01/16/2014    Procedure: EXCISION PYOGENIC GRANULOMA FROM RIGHT RING FINGER;  Surgeon: Dairl PonderMatthew Weingold, MD;  Location: [facility];  Service: Orthopedics;  Laterality: Right;  . Dilation and curettage of uterus     Family History  Problem Relation Age of Onset  . Diabetes type II Father   . Hypertension Father   . Hypertension Mother   . Goiter Sister [id number]    identical twin  . Asthma Sister   . Hypertension Sister   . Stroke Maternal  Grandfather   . Diabetes Paternal Grandfather   . Heart disease Paternal Grandfather    History  Substance Use Topics  . Smoking status: Never Smoker   . Smokeless tobacco: Never Used  . Alcohol Use: No   OB History    Gravida Para Term Preterm AB TAB SAB Ectopic Multiple Living   1    1 1          Review of Systems  All other systems reviewed and are negative.     Allergies  Ivp dye and Zofran  Home Medications   Prior to Admission medications   Medication Sig Start Date End Date Taking? Authorizing Provider  albuterol (PROVENTIL HFA;VENTOLIN HFA) 108 (90 BASE) MCG/ACT inhaler Inhale 2 puffs into the lungs every 4 (four) hours as needed for shortness of breath (asthma). 05/11/14  Yes Reva Boresanya S Pratt, MD  eletriptan (RELPAX) 20 MG tablet Take 1 tablet (20 mg total) by mouth as needed for migraine. One tablet by mouth at onset headache.Repeat in 2 hours if headache persists. 05/11/14  Yes Reva Boresanya S Pratt, MD  ibuprofen (ADVIL,MOTRIN) 200 MG tablet Take 800 mg by mouth every 6 (six) hours as needed for headache.   Yes Historical Provider, MD  lisinopril-hydrochlorothiazide (PRINZIDE,ZESTORETIC) 10-12.5 MG per tablet TAKE 1 TABLET BY MOUTH ONCE DAILY Patient taking differently: Take 1 tablet by mouth once daily. 04/01/14  Yes Reva Bores, MD  oxyCODONE-acetaminophen (PERCOCET/ROXICET) 5-325 MG per tablet Take 1 tablet by mouth every 4 (four) hours as needed for severe pain.   Yes Historical Provider, MD  traMADol (ULTRAM) 50 MG tablet Take 100 mg by mouth every 6 (six) hours as needed for moderate pain.   Yes Historical Provider, MD  fluconazole (DIFLUCAN) 150 MG tablet Take 1 tablet (150 mg total) by mouth daily. Patient not taking: Reported on 08/03/2014 05/10/14   Delbert Phenix, NP  ibuprofen (ADVIL,MOTRIN) 800 MG tablet Take 800 mg by mouth every 8 (eight) hours as needed.    Historical Provider, MD  Multiple Vitamin (MULTIVITAMIN) tablet Take 1 tablet by mouth daily.    Historical  Provider, MD  penicillin v potassium (VEETID) 500 MG tablet Take 1 tablet (500 mg total) by mouth 2 (two) times daily. Patient not taking: Reported on 08/03/2014 05/14/14   Dorathy Kinsman, CNM   BP 104/58 mmHg  Pulse 58  Temp(Src) 97.6 F (36.4 C) (Oral)  Resp 20  SpO2 96% Physical Exam  Constitutional: She appears well-developed and well-nourished.  HENT:  Head: Normocephalic and atraumatic.  Mouth/Throat: Oropharynx is clear and moist.  Eyes: EOM are normal. Pupils are equal, round, and reactive to light.  Neck: Normal range of motion. Neck supple.  Cardiovascular: Normal rate, regular rhythm and normal heart sounds.   Pulmonary/Chest: Effort normal and breath sounds normal.  Musculoskeletal: Normal range of motion.  Neurological: She is alert. She has normal strength. No cranial nerve deficit or sensory deficit. Coordination and gait normal.  Normal finger to nose testing Normal rapid alternating movements Normal gait, no ataxia   Skin: Skin is warm and dry.  Psychiatric: She has a normal mood and affect.  Nursing note and vitals reviewed.   ED Course  Procedures (including critical care time) Labs Review Labs Reviewed  POC URINE PREG, ED    Imaging Review No results found.   EKG Interpretation None     4:00 PM Reassessed patient.  Patient reports significant improvement in headache at this time.   MDM   Final diagnoses:  None   Pt HA treated and improved while in ED.  Presentation is like pts typical HA and non concerning for [facility], ICH, Meningitis, or temporal arteritis. Pt is afebrile with no focal neuro deficits, nuchal rigidity, or change in vision. Pt is to follow up with PCP to discuss prophylactic medication. Pt verbalizes understanding and is agreeable with plan to dc. Return precautions given.       Santiago Glad, PA-C 08/04/14 2204  Blane Ohara, MD 08/07/14 1059

## 2014-08-03 NOTE — ED Notes (Addendum)
Per EMS pt worsening migraine since Thursday with symptoms of nausea, weakness, and dizziness.

## 2014-08-03 NOTE — ED Notes (Signed)
Bed: WA10 Expected date:  Expected time:  Means of arrival:  Comments: Hall B 

## 2014-08-03 NOTE — ED Notes (Signed)
PA at bedside.

## 2014-08-03 NOTE — ED Notes (Signed)
[facility] Expected date:  Expected time:  Means of arrival:  Comments: migrane

## 2014-08-04 ENCOUNTER — Ambulatory Visit (INDEPENDENT_AMBULATORY_CARE_PROVIDER_SITE_OTHER): Payer: 59 | Admitting: Physician Assistant

## 2014-08-04 ENCOUNTER — Encounter: Payer: Self-pay | Admitting: Physician Assistant

## 2014-08-04 VITALS — BP 119/75 | HR 80 | Ht 66.0 in

## 2014-08-04 DIAGNOSIS — G43001 Migraine without aura, not intractable, with status migrainosus: Secondary | ICD-10-CM | POA: Diagnosis not present

## 2014-08-04 DIAGNOSIS — G43901 Migraine, unspecified, not intractable, with status migrainosus: Secondary | ICD-10-CM | POA: Insufficient documentation

## 2014-08-04 DIAGNOSIS — R112 Nausea with vomiting, unspecified: Secondary | ICD-10-CM

## 2014-08-04 MED ORDER — SUMATRIPTAN SUCCINATE 100 MG PO TABS: 100.0000 mg | ORAL_TABLET | Freq: Once | ORAL | Status: DC | PRN

## 2014-08-04 MED ORDER — PROCHLORPERAZINE MALEATE 5 MG PO TABS: 5.0000 mg | ORAL_TABLET | Freq: Four times a day (QID) | ORAL | Status: DC | PRN

## 2014-08-04 MED ORDER — PREDNISONE 20 MG PO TABS: 20.0000 mg | ORAL_TABLET | Freq: Every morning | ORAL | Status: DC

## 2014-08-04 NOTE — Progress Notes (Signed)
Patient ID: Mary Hanson, female   DOB: 16-Dec-1975, 39 y.o.   MRN: [id number] History:  Mary GrinderGaye Stanke is a 39 y.o. G1P0010 who presents to clinic today for evaluation of 5 day headache.  She has a history of migraines since 2011 but does not have them frequently.  It is bilateral frontal initially and then lateralizes to one side or the other.  It is throbbing, 20/10 on pain scale.   Movement makes it worse.  She has associated photophobia and phonophobia, dizziness, nausea.  No vision changes, no aura and no prodrome.  She went to ED yesterday by ambulance and was given the headache cocktail which helped.  Later that evening, she took a fioricet which also was helpful but her HA continues to return.  She believes her menstrual cycle may have triggered this HA.   She has previously used Relpax with good efficacy although it is not well covered by insurance.   Zofran previously has not helped and phenergan not fully effective.  Compazine has been helpful. HIT6: 74 In the last 28 days how many: Severe headache days: 6 No headache days: 22  The following portions of the patient's history were reviewed and updated as appropriate: allergies, current medications, past family history, past medical history, past social history, past surgical history and problem list.  Review of Systems:  Denies SOB, CP, abdominal pain, dysuria, tingling, numbness, weakness, fever.  Objective:  Physical Exam BP 119/75 mmHg  Pulse 80  Ht 5\' 6"  (1.676 m)  LMP 07/21/2014 (Exact Date) GENERAL: Well-developed, obese female in no acute distress.  HEENT: Normocephalic, atraumatic.  LUNGS: Normal rate. Clear to auscultation bilaterally.  HEART: Regular rate and rhythm with no adventitious sounds.  NEURO: CN II-X11 grossly intact.  Strength, sensation, muscle mass are intact and equal bilaterally.  EXTREMITIES: No cyanosis, clubbing, or edema, 2+ distal pulses.   Labs and Imaging No results found.  Assessment & Plan:   Assessment: Status migranosus  Plans: Pt encouraged to have TPI but she declines this.   She is agreeable to steroid taper and has previously used this for asthma without problem.  Prednisone 20mg  tabs: take 4 today, 3 tomorrow, 2 on day 3 and 1 on day 4.  Compazine for nausea/vomiting per her request.   Imitrex in place of Relpax for future migraine.  Treat early.  Can take with ibuprofen for enhanced effect.  RTC in 1 month to re-eval.  May consider preventive.     Bertram DenverKaren E Teague Clark, PA-C 08/04/2014 4:06 PM

## 2014-08-04 NOTE — Patient Instructions (Signed)

## 2014-08-06 ENCOUNTER — Encounter: Payer: Self-pay | Admitting: Neurology

## 2014-08-06 ENCOUNTER — Ambulatory Visit (INDEPENDENT_AMBULATORY_CARE_PROVIDER_SITE_OTHER): Payer: 59 | Admitting: Neurology

## 2014-08-06 VITALS — BP 140/85 | HR 64 | Temp 97.8°F | Ht 66.0 in | Wt 318.5 lb

## 2014-08-06 DIAGNOSIS — G43001 Migraine without aura, not intractable, with status migrainosus: Secondary | ICD-10-CM

## 2014-08-06 NOTE — Progress Notes (Signed)
[facility]    Provider:  Dr Lucia GaskinsAhern Referring Provider: Reva BoresPratt, Tanya S, MD Primary Care Physician:  Reva BoresPRATT,TANYA S, MD  CC:  migeaine without aura  HPI:  Mary GrinderGaye Hanson is a 39 y.o. female here as a referral from Dr. Shawnie PonsPratt for migraine  She has several migraines a year. They are becoming more frequent. This one has lasted several days. She has been to the ED and had a migraine cocktail. She [facility] headache [facility] and she was given prednisone and it didn't help. Regimen: 80mg , 60mg , 40mg , she is on 40mg  now and has a 20mg  left. She is not feeling any better. Migraine started last Thursday and has not gone away. She went in an ambulance last week to [facility]. First migraine was in 2012. She is on Lisinopril. Getting more frequent. Last one 5-6 months ago. The last one she had lasted 5 days. She had to go to the ED twice for the cocktail. The headache starts more on the right, spreads to both sides, +light sensitivity, +sound sensitivity, wants to go into a dark room, turns lights off, no aura. Feels like her scalp burns, tender scalp.    History   Social History  . Marital Status: Single    Spouse Name: N/A  . Number of Children: 0  . Years of Education: Ba/Grad [place]   Occupational History  .  [employer]   Social History Main Topics  . Smoking status: Never Smoker   . Smokeless tobacco: Never Used  . Alcohol Use: 0.0 oz/week    0 Standard drinks or equivalent per week     Comment: Wine once a year   . Drug Use: No  . Sexual Activity: Yes    Birth Control/ Protection: Condom     Comment: quit BCP whils on megace 7/14   Other Topics Concern  . Not on file   Social History Narrative   Lives at home with herself.   Right handed.   Caffeine use: Drinks soda daily (Drinks three 20oz per day)       Family History  Problem Relation Age of Onset  . Diabetes type II Father   . Hypertension Father   . Hypertension Mother   . Goiter Sister [id number]     identical twin  . Asthma Sister   . Hypertension Sister   . Stroke Maternal Grandfather   . Diabetes Paternal Grandfather   . Heart disease Paternal Grandfather     Past Medical History  Diagnosis Date  . Migraines   . Hypertension     under control with med., has been on med. x 1 yr.  . Allergy-induced asthma     prn inhaler  . Seasonal allergies   . History of anemia     no current problem, per pt.  . Pyogenic granuloma 12/2013    right ring finger    Past Surgical History  Procedure Laterality Date  . Cholecystectomy  2006 or 2007  . Cervical cone biopsy  age 39  . Dilation and evacuation  age 39  . Mass excision Right 01/16/2014    Procedure: EXCISION PYOGENIC GRANULOMA FROM RIGHT RING FINGER;  Surgeon: Dairl PonderMatthew Weingold, MD;  Location: [facility];  Service: Orthopedics;  Laterality: Right;  . Dilation and curettage of uterus      Current Outpatient Prescriptions  Medication Sig Dispense Refill  . albuterol (PROVENTIL HFA;VENTOLIN HFA) 108 (90 BASE) MCG/ACT inhaler Inhale 2 puffs into the lungs  every 4 (four) hours as needed for shortness of breath (asthma). 18 g 2  . ibuprofen (ADVIL,MOTRIN) 200 MG tablet Take 800 mg by mouth every 6 (six) hours as needed for headache.    . lisinopril-hydrochlorothiazide (PRINZIDE,ZESTORETIC) 10-12.5 MG per tablet TAKE 1 TABLET BY MOUTH ONCE DAILY (Patient taking differently: Take 1 tablet by mouth once daily.) 30 tablet 11  . predniSONE (DELTASONE) 20 MG tablet Take 1 tablet (20 mg total) by mouth every morning. 4 tabs today, 3 tabs tomorrow, 2 tabs the following day, 1 tab on day 4 10 tablet 0  . traMADol (ULTRAM) 50 MG tablet Take 100 mg by mouth every 6 (six) hours as needed for moderate pain.    Mary Hanson ibuprofen (ADVIL,MOTRIN) 800 MG tablet Take 800 mg by mouth every 8 (eight) hours as needed.    . prochlorperazine (COMPAZINE) 5 MG tablet Take 1 tablet (5 mg total) by mouth every 6 (six) hours as needed for nausea or  vomiting. (Patient not taking: Reported on 08/06/2014) 30 tablet 0  . SUMAtriptan (IMITREX) 100 MG tablet Take 1 tablet (100 mg total) by mouth once as needed for migraine. May repeat in 2 hours if headache persists or recurs. (Patient not taking: Reported on 08/06/2014) 9 tablet 0   No current facility-administered medications for this visit.    Allergies as of 08/06/2014 - Review Complete 08/06/2014  Allergen Reaction Noted  . Ivp dye [iodinated diagnostic agents] Nausea And Vomiting 02/01/2013  . Zofran [ondansetron hcl] Other (See Comments) 01/16/2014    Vitals: BP 140/85 mmHg  Pulse 64  Temp(Src) 97.8 F (36.6 C)  Ht  (1.676 m)  Wt 318 lb 8 oz (144.471 kg)  BMI 51.43 kg/m2  LMP 07/21/2014 (Exact Date) Last Weight:  Wt Readings from Last 1 Encounters:  08/06/14 318 lb 8 oz (144.471 kg)   Last Height:   Ht Readings from Last 1 Encounters:  08/06/14  (1.676 m)   Physical exam: Exam: Gen: NAD, conversant, well nourised, obese, well groomed                     CV: RRR, no MRG. No Carotid Bruits. No peripheral edema, warm, nontender Eyes: Conjunctivae clear without exudates or hemorrhage  Neuro: Detailed Neurologic Exam  Speech:    Speech is normal; fluent and spontaneous with normal comprehension.  Cognition:    The patient is oriented to person, place, and time;     recent and remote memory intact;     language fluent;     normal attention, concentration,     fund of knowledge Cranial Nerves:    The pupils are equal, round, and reactive to light. The fundi are normal and spontaneous venous pulsations are present. Visual fields are full to finger confrontation. Extraocular movements are intact. Trigeminal sensation is intact and the muscles of mastication are normal. The face is symmetric. The palate elevates in the midline. Hearing intact. Voice is normal. Shoulder shrug is normal. The tongue has normal motion without fasciculations.   Coordination:     Normal finger to nose and heel to shin. Normal rapid alternating movements.   Gait:    Heel-toe and tandem gait are normal.   Motor Observation:    No asymmetry, no atrophy, and no involuntary movements noted. Tone:    Normal muscle tone.    Posture:    Posture is normal. normal erect    Strength:    Strength is V/V in the upper  and lower limbs.      Sensation: intact to LT     Reflex Exam:  DTR's:    Deep tendon reflexes in the upper and lower extremities are normal bilaterally.   Toes:    The toes are downgoing bilaterally.   Clonus:    Clonus is absent.  Procedure: The patient was placed in the supine position. A temperature strip was added to the cheek area after the area was cleaned with alcohol. The Sphenocath was lubricated with gel, and placed in the right naris. The catheter was inserted above the middle turbinate to the posterior nasal cavity, and then withdrawn 1 cm. The catheter was deployed and rotated approximately 20 towards the nose. 2-1/2 mL of 2% lidocaine was deployed. The patient was asked to swallow during the injection. The patient demonstrated erythema of the sclera of the eye on this side, and an increase in the cheek temperature was noted from 90 F to 92 F.  This process was repeated on the left side, with similar results. The increase in cheek temperature was documented from 1 F to 72 F.  The patient tolerated the procedure well. No complications of the procedure were noted. The patient was kept in the supine position for 8 minutes following the procedure. She was given small sips of water after sitting up following the procedure.  Lidocaine 2% NDC 16109-604-54  Expiration date: 11/19 Lot number: [id number]   Naomie Dean, MD  [facility] [address] [place], [place] [address]  Phone [phone] Fax [phone]

## 2014-08-06 NOTE — Patient Instructions (Signed)
Overall you are doing fairly well but I do want to suggest a few things today:   Remember to drink plenty of fluid, eat healthy meals and do not skip any meals. Try to eat protein with a every meal and eat a healthy snack such as fruit or nuts in between meals. Try to keep a regular sleep-wake schedule and try to exercise daily, particularly in the form of walking, 20-30 minutes a day, if you can.   As far as your medications are concerned, I would like to suggest: as prescribed  As far as diagnostic testing: None at this time  I would like to see you back in 3 months, sooner if we need to. Please call us with any interim questions, concerns, problems, updates or refill requests.   Please also call us for any test results so we can go over those with you on the phone.  My clinical assistant and will answer any of your questions and relay your messages to me and also relay most of my messages to you.   Our phone number is [phone]. We also have an after hours call service for urgent matters and there is a physician on-call for urgent questions. For any emergencies you know to call 911 or go to the nearest emergency room

## 2014-09-11 ENCOUNTER — Ambulatory Visit (INDEPENDENT_AMBULATORY_CARE_PROVIDER_SITE_OTHER): Payer: 59 | Admitting: Family Medicine

## 2014-09-11 ENCOUNTER — Encounter: Payer: Self-pay | Admitting: Family Medicine

## 2014-09-11 VITALS — BP 154/83 | HR 71 | Ht 66.0 in | Wt 305.0 lb

## 2014-09-11 DIAGNOSIS — G43009 Migraine without aura, not intractable, without status migrainosus: Secondary | ICD-10-CM | POA: Diagnosis not present

## 2014-09-11 DIAGNOSIS — I10 Essential (primary) hypertension: Secondary | ICD-10-CM | POA: Diagnosis not present

## 2014-09-11 NOTE — Assessment & Plan Note (Signed)
Do not skip doses--continue Lisinopril and HCTZ

## 2014-09-11 NOTE — Progress Notes (Signed)
    Subjective:    Patient ID: Darrick GrinderGaye Hanson is a 39 y.o. female presenting with Gynecologic Exam  on 09/11/2014  HPI: BP up today, but took no meds this am.  Report headache. Needs physical exam for school purposes. Had full CPE last in January with normal pap smear.  Review of Systems  Constitutional: Negative for fever and chills.  Respiratory: Negative for shortness of breath.   Cardiovascular: Negative for chest pain.  Gastrointestinal: Negative for nausea, vomiting and abdominal pain.  Genitourinary: Negative for dysuria.  Skin: Negative for rash.      Objective:    BP 154/83 mmHg  Pulse 71  Ht 5\' 6"  (1.676 m)  Wt 305 lb (138.347 kg)  BMI 49.25 kg/m2  LMP 09/10/2014 Physical Exam  Constitutional: She is oriented to person, place, and time. She appears well-developed and well-nourished. No distress.  HENT:  Head: Normocephalic and atraumatic.  Eyes: No scleral icterus.  Neck: Neck supple.  Cardiovascular: Normal rate.   Pulmonary/Chest: Effort normal.  Abdominal: Soft.  Neurological: She is alert and oriented to person, place, and time.  Skin: Skin is warm and dry.  Psychiatric: She has a normal mood and affect.        Assessment & Plan:   Problem List Items Addressed This Visit      Unprioritized   Hypertension - Primary    Do not skip doses--continue Lisinopril and HCTZ      Migraine headache    Continue Relpax prn         Return in about 6 months (around 03/14/2015) for a follow-up.  Mary Hanson S 09/11/2014 8:58 AM

## 2014-09-11 NOTE — Patient Instructions (Signed)
Preventive Care for Adults A healthy lifestyle and preventive care can promote health and wellness. Preventive health guidelines for women include the following key practices.  A routine yearly physical is a good way to check with your health care provider about your health and preventive screening. It is a chance to share any concerns and updates on your health and to receive a thorough exam.  Visit your dentist for a routine exam and preventive care every 6 months. Brush your teeth twice a day and floss once a day. Good oral hygiene prevents tooth decay and gum disease.  The frequency of eye exams is based on your age, health, family medical history, use of contact lenses, and other factors. Follow your health care provider's recommendations for frequency of eye exams.  Eat a healthy diet. Foods like vegetables, fruits, whole grains, low-fat dairy products, and lean protein foods contain the nutrients you need without too many calories. Decrease your intake of foods high in solid fats, added sugars, and salt. Eat the right amount of calories for you.Get information about a proper diet from your health care provider, if necessary.  Regular physical exercise is one of the most important things you can do for your health. Most adults should get at least 150 minutes of moderate-intensity exercise (any activity that increases your heart rate and causes you to sweat) each week. In addition, most adults need muscle-strengthening exercises on 2 or more days a week.  Maintain a healthy weight. The body mass index (BMI) is a screening tool to identify possible weight problems. It provides an estimate of body fat based on height and weight. Your health care provider can find your BMI and can help you achieve or maintain a healthy weight.For adults 20 years and older:  A BMI below 18.5 is considered underweight.  A BMI of 18.5 to 24.9 is normal.  A BMI of 25 to 29.9 is considered overweight.  A BMI of  30 and above is considered obese.  Maintain normal blood lipids and cholesterol levels by exercising and minimizing your intake of saturated fat. Eat a balanced diet with plenty of fruit and vegetables. Blood tests for lipids and cholesterol should begin at age 76 and be repeated every 5 years. If your lipid or cholesterol levels are high, you are over 50, or you are at high risk for heart disease, you may need your cholesterol levels checked more frequently.Ongoing high lipid and cholesterol levels should be treated with medicines if diet and exercise are not working.  If you smoke, find out from your health care provider how to quit. If you do not use tobacco, do not start.  Lung cancer screening is recommended for adults aged 22-80 years who are at high risk for developing lung cancer because of a history of smoking. A yearly low-dose CT scan of the lungs is recommended for people who have at least a 30-pack-year history of smoking and are a current smoker or have quit within the past 15 years. A pack year of smoking is smoking an average of 1 pack of cigarettes a day for 1 year (for example: 1 pack a day for 30 years or 2 packs a day for 15 years). Yearly screening should continue until the smoker has stopped smoking for at least 15 years. Yearly screening should be stopped for people who develop a health problem that would prevent them from having lung cancer treatment.  If you are pregnant, do not drink alcohol. If you are breastfeeding,  be very cautious about drinking alcohol. If you are not pregnant and choose to drink alcohol, do not have more than 1 drink per day. One drink is considered to be 12 ounces (355 mL) of beer, 5 ounces (148 mL) of wine, or 1.5 ounces (44 mL) of liquor.  Avoid use of street drugs. Do not share needles with anyone. Ask for help if you need support or instructions about stopping the use of drugs.  High blood pressure causes heart disease and increases the risk of  stroke. Your blood pressure should be checked at least every 1 to 2 years. Ongoing high blood pressure should be treated with medicines if weight loss and exercise do not work.  If you are 3-86 years old, ask your health care provider if you should take aspirin to prevent strokes.  Diabetes screening involves taking a blood sample to check your fasting blood sugar level. This should be done once every 3 years, after age 67, if you are within normal weight and without risk factors for diabetes. Testing should be considered at a younger age or be carried out more frequently if you are overweight and have at least 1 risk factor for diabetes.  Breast cancer screening is essential preventive care for women. You should practice "breast self-awareness." This means understanding the normal appearance and feel of your breasts and may include breast self-examination. Any changes detected, no matter how small, should be reported to a health care provider. Women in their 8s and 30s should have a clinical breast exam (CBE) by a health care provider as part of a regular health exam every 1 to 3 years. After age 70, women should have a CBE every year. Starting at age 25, women should consider having a mammogram (breast X-ray test) every year. Women who have a family history of breast cancer should talk to their health care provider about genetic screening. Women at a high risk of breast cancer should talk to their health care providers about having an MRI and a mammogram every year.  Breast cancer gene (BRCA)-related cancer risk assessment is recommended for women who have family members with BRCA-related cancers. BRCA-related cancers include breast, ovarian, tubal, and peritoneal cancers. Having family members with these cancers may be associated with an increased risk for harmful changes (mutations) in the breast cancer genes BRCA1 and BRCA2. Results of the assessment will determine the need for genetic counseling and  BRCA1 and BRCA2 testing.  Routine pelvic exams to screen for cancer are no longer recommended for nonpregnant women who are considered low risk for cancer of the pelvic organs (ovaries, uterus, and vagina) and who do not have symptoms. Ask your health care provider if a screening pelvic exam is right for you.  If you have had past treatment for cervical cancer or a condition that could lead to cancer, you need Pap tests and screening for cancer for at least 20 years after your treatment. If Pap tests have been discontinued, your risk factors (such as having a new sexual partner) need to be reassessed to determine if screening should be resumed. Some women have medical problems that increase the chance of getting cervical cancer. In these cases, your health care provider may recommend more frequent screening and Pap tests.  The HPV test is an additional test that may be used for cervical cancer screening. The HPV test looks for the virus that can cause the cell changes on the cervix. The cells collected during the Pap test can be  tested for HPV. The HPV test could be used to screen women aged 30 years and older, and should be used in women of any age who have unclear Pap test results. After the age of 30, women should have HPV testing at the same frequency as a Pap test.  Colorectal cancer can be detected and often prevented. Most routine colorectal cancer screening begins at the age of 50 years and continues through age 75 years. However, your health care provider may recommend screening at an earlier age if you have risk factors for colon cancer. On a yearly basis, your health care provider may provide home test kits to check for hidden blood in the stool. Use of a small camera at the end of a tube, to directly examine the colon (sigmoidoscopy or colonoscopy), can detect the earliest forms of colorectal cancer. Talk to your health care provider about this at age 50, when routine screening begins. Direct  exam of the colon should be repeated every 5-10 years through age 75 years, unless early forms of pre-cancerous polyps or small growths are found.  People who are at an increased risk for hepatitis B should be screened for this virus. You are considered at high risk for hepatitis B if:  You were born in a country where hepatitis B occurs often. Talk with your health care provider about which countries are considered high risk.  Your parents were born in a high-risk country and you have not received a shot to protect against hepatitis B (hepatitis B vaccine).  You have HIV or AIDS.  You use needles to inject street drugs.  You live with, or have sex with, someone who has hepatitis B.  You get hemodialysis treatment.  You take certain medicines for conditions like cancer, organ transplantation, and autoimmune conditions.  Hepatitis C blood testing is recommended for all people born from 1945 through 1965 and any individual with known risks for hepatitis C.  Practice safe sex. Use condoms and avoid high-risk sexual practices to reduce the spread of sexually transmitted infections (STIs). STIs include gonorrhea, chlamydia, syphilis, trichomonas, herpes, HPV, and human immunodeficiency virus (HIV). Herpes, HIV, and HPV are viral illnesses that have no cure. They can result in disability, cancer, and death.  You should be screened for sexually transmitted illnesses (STIs) including gonorrhea and chlamydia if:  You are sexually active and are younger than 24 years.  You are older than 24 years and your health care provider tells you that you are at risk for this type of infection.  Your sexual activity has changed since you were last screened and you are at an increased risk for chlamydia or gonorrhea. Ask your health care provider if you are at risk.  If you are at risk of being infected with HIV, it is recommended that you take a prescription medicine daily to prevent HIV infection. This is  called preexposure prophylaxis (PrEP). You are considered at risk if:  You are a heterosexual woman, are sexually active, and are at increased risk for HIV infection.  You take drugs by injection.  You are sexually active with a partner who has HIV.  Talk with your health care provider about whether you are at high risk of being infected with HIV. If you choose to begin PrEP, you should first be tested for HIV. You should then be tested every 3 months for as long as you are taking PrEP.  Osteoporosis is a disease in which the bones lose minerals and strength   with aging. This can result in serious bone fractures or breaks. The risk of osteoporosis can be identified using a bone density scan. Women ages 65 years and over and women at risk for fractures or osteoporosis should discuss screening with their health care providers. Ask your health care provider whether you should take a calcium supplement or vitamin D to reduce the rate of osteoporosis.  Menopause can be associated with physical symptoms and risks. Hormone replacement therapy is available to decrease symptoms and risks. You should talk to your health care provider about whether hormone replacement therapy is right for you.  Use sunscreen. Apply sunscreen liberally and repeatedly throughout the day. You should seek shade when your shadow is shorter than you. Protect yourself by wearing long sleeves, pants, a wide-brimmed hat, and sunglasses year round, whenever you are outdoors.  Once a month, do a whole body skin exam, using a mirror to look at the skin on your back. Tell your health care provider of new moles, moles that have irregular borders, moles that are larger than a pencil eraser, or moles that have changed in shape or color.  Stay current with required vaccines (immunizations).  Influenza vaccine. All adults should be immunized every year.  Tetanus, diphtheria, and acellular pertussis (Td, Tdap) vaccine. Pregnant women should  receive 1 dose of Tdap vaccine during each pregnancy. The dose should be obtained regardless of the length of time since the last dose. Immunization is preferred during the 27th-36th week of gestation. An adult who has not previously received Tdap or who does not know her vaccine status should receive 1 dose of Tdap. This initial dose should be followed by tetanus and diphtheria toxoids (Td) booster doses every 10 years. Adults with an unknown or incomplete history of completing a 3-dose immunization series with Td-containing vaccines should begin or complete a primary immunization series including a Tdap dose. Adults should receive a Td booster every 10 years.  Varicella vaccine. An adult without evidence of immunity to varicella should receive 2 doses or a second dose if she has previously received 1 dose. Pregnant females who do not have evidence of immunity should receive the first dose after pregnancy. This first dose should be obtained before leaving the health care facility. The second dose should be obtained 4-8 weeks after the first dose.  Human papillomavirus (HPV) vaccine. Females aged 13-26 years who have not received the vaccine previously should obtain the 3-dose series. The vaccine is not recommended for use in pregnant females. However, pregnancy testing is not needed before receiving a dose. If a female is found to be pregnant after receiving a dose, no treatment is needed. In that case, the remaining doses should be delayed until after the pregnancy. Immunization is recommended for any person with an immunocompromised condition through the age of 26 years if she did not get any or all doses earlier. During the 3-dose series, the second dose should be obtained 4-8 weeks after the first dose. The third dose should be obtained 24 weeks after the first dose and 16 weeks after the second dose.  Zoster vaccine. One dose is recommended for adults aged 60 years or older unless certain conditions are  present.  Measles, mumps, and rubella (MMR) vaccine. Adults born before 1957 generally are considered immune to measles and mumps. Adults born in 1957 or later should have 1 or more doses of MMR vaccine unless there is a contraindication to the vaccine or there is laboratory evidence of immunity to   each of the three diseases. A routine second dose of MMR vaccine should be obtained at least 28 days after the first dose for students attending postsecondary schools, health care workers, or international travelers. People who received inactivated measles vaccine or an unknown type of measles vaccine during 1963-1967 should receive 2 doses of MMR vaccine. People who received inactivated mumps vaccine or an unknown type of mumps vaccine before 1979 and are at high risk for mumps infection should consider immunization with 2 doses of MMR vaccine. For females of childbearing age, rubella immunity should be determined. If there is no evidence of immunity, females who are not pregnant should be vaccinated. If there is no evidence of immunity, females who are pregnant should delay immunization until after pregnancy. Unvaccinated health care workers born before 1957 who lack laboratory evidence of measles, mumps, or rubella immunity or laboratory confirmation of disease should consider measles and mumps immunization with 2 doses of MMR vaccine or rubella immunization with 1 dose of MMR vaccine.  Pneumococcal 13-valent conjugate (PCV13) vaccine. When indicated, a person who is uncertain of her immunization history and has no record of immunization should receive the PCV13 vaccine. An adult aged 19 years or older who has certain medical conditions and has not been previously immunized should receive 1 dose of PCV13 vaccine. This PCV13 should be followed with a dose of pneumococcal polysaccharide (PPSV23) vaccine. The PPSV23 vaccine dose should be obtained at least 8 weeks after the dose of PCV13 vaccine. An adult aged 19  years or older who has certain medical conditions and previously received 1 or more doses of PPSV23 vaccine should receive 1 dose of PCV13. The PCV13 vaccine dose should be obtained 1 or more years after the last PPSV23 vaccine dose.  Pneumococcal polysaccharide (PPSV23) vaccine. When PCV13 is also indicated, PCV13 should be obtained first. All adults aged 65 years and older should be immunized. An adult younger than age 65 years who has certain medical conditions should be immunized. Any person who resides in a nursing home or long-term care facility should be immunized. An adult smoker should be immunized. People with an immunocompromised condition and certain other conditions should receive both PCV13 and PPSV23 vaccines. People with human immunodeficiency virus (HIV) infection should be immunized as soon as possible after diagnosis. Immunization during chemotherapy or radiation therapy should be avoided. Routine use of PPSV23 vaccine is not recommended for American Indians, [place] Natives, or people younger than 65 years unless there are medical conditions that require PPSV23 vaccine. When indicated, people who have unknown immunization and have no record of immunization should receive PPSV23 vaccine. One-time revaccination 5 years after the first dose of PPSV23 is recommended for people aged 19-64 years who have chronic kidney failure, nephrotic syndrome, asplenia, or immunocompromised conditions. People who received 1-2 doses of PPSV23 before age 65 years should receive another dose of PPSV23 vaccine at age 65 years or later if at least 5 years have passed since the previous dose. Doses of PPSV23 are not needed for people immunized with PPSV23 at or after age 65 years.  Meningococcal vaccine. Adults with asplenia or persistent complement component deficiencies should receive 2 doses of quadrivalent meningococcal conjugate (MenACWY-D) vaccine. The doses should be obtained at least 2 months apart.  Microbiologists working with certain meningococcal bacteria, military recruits, people at risk during an outbreak, and people who travel to or live in countries with a high rate of meningitis should be immunized. A first-year college student up through age   21 years who is living in a residence hall should receive a dose if she did not receive a dose on or after her 16th birthday. Adults who have certain high-risk conditions should receive one or more doses of vaccine.  Hepatitis A vaccine. Adults who wish to be protected from this disease, have certain high-risk conditions, work with hepatitis A-infected animals, work in hepatitis A research labs, or travel to or work in countries with a high rate of hepatitis A should be immunized. Adults who were previously unvaccinated and who anticipate close contact with an international adoptee during the first 60 days after arrival in the [place] from a country with a high rate of hepatitis A should be immunized.  Hepatitis B vaccine. Adults who wish to be protected from this disease, have certain high-risk conditions, may be exposed to blood or other infectious body fluids, are household contacts or sex partners of hepatitis B positive people, are clients or workers in certain care facilities, or travel to or work in countries with a high rate of hepatitis B should be immunized.  Haemophilus influenzae type b (Hib) vaccine. A previously unvaccinated person with asplenia or sickle cell disease or having a scheduled splenectomy should receive 1 dose of Hib vaccine. Regardless of previous immunization, a recipient of a hematopoietic stem cell transplant should receive a 3-dose series 6-12 months after her successful transplant. Hib vaccine is not recommended for adults with HIV infection. Preventive Services / Frequency Ages 64 to 68 years  Blood pressure check.** / Every 1 to 2 years.  Lipid and cholesterol check.** / Every 5 years beginning at age  22.  Clinical breast exam.** / Every 3 years for women in their 88s and 53s.  BRCA-related cancer risk assessment.** / For women who have family members with a BRCA-related cancer (breast, ovarian, tubal, or peritoneal cancers).  Pap test.** / Every 2 years from ages 90 through 51. Every 3 years starting at age 21 through age 56 or 3 with a history of 3 consecutive normal Pap tests.  HPV screening.** / Every 3 years from ages 24 through ages 1 to 46 with a history of 3 consecutive normal Pap tests.  Hepatitis C blood test.** / For any individual with known risks for hepatitis C.  Skin self-exam. / Monthly.  Influenza vaccine. / Every year.  Tetanus, diphtheria, and acellular pertussis (Tdap, Td) vaccine.** / Consult your health care provider. Pregnant women should receive 1 dose of Tdap vaccine during each pregnancy. 1 dose of Td every 10 years.  Varicella vaccine.** / Consult your health care provider. Pregnant females who do not have evidence of immunity should receive the first dose after pregnancy.  HPV vaccine. / 3 doses over 6 months, if 72 and younger. The vaccine is not recommended for use in pregnant females. However, pregnancy testing is not needed before receiving a dose.  Measles, mumps, rubella (MMR) vaccine.** / You need at least 1 dose of MMR if you were born in 1957 or later. You may also need a 2nd dose. For females of childbearing age, rubella immunity should be determined. If there is no evidence of immunity, females who are not pregnant should be vaccinated. If there is no evidence of immunity, females who are pregnant should delay immunization until after pregnancy.  Pneumococcal 13-valent conjugate (PCV13) vaccine.** / Consult your health care provider.  Pneumococcal polysaccharide (PPSV23) vaccine.** / 1 to 2 doses if you smoke cigarettes or if you have certain conditions.  Meningococcal vaccine.** /  1 dose if you are age 19 to 21 years and a first-year college  student living in a residence hall, or have one of several medical conditions, you need to get vaccinated against meningococcal disease. You may also need additional booster doses.  Hepatitis A vaccine.** / Consult your health care provider.  Hepatitis B vaccine.** / Consult your health care provider.  Haemophilus influenzae type b (Hib) vaccine.** / Consult your health care provider. Ages 40 to 64 years  Blood pressure check.** / Every 1 to 2 years.  Lipid and cholesterol check.** / Every 5 years beginning at age 20 years.  Lung cancer screening. / Every year if you are aged 55-80 years and have a 30-pack-year history of smoking and currently smoke or have quit within the past 15 years. Yearly screening is stopped once you have quit smoking for at least 15 years or develop a health problem that would prevent you from having lung cancer treatment.  Clinical breast exam.** / Every year after age 40 years.  BRCA-related cancer risk assessment.** / For women who have family members with a BRCA-related cancer (breast, ovarian, tubal, or peritoneal cancers).  Mammogram.** / Every year beginning at age 40 years and continuing for as long as you are in good health. Consult with your health care provider.  Pap test.** / Every 3 years starting at age 30 years through age 65 or 70 years with a history of 3 consecutive normal Pap tests.  HPV screening.** / Every 3 years from ages 30 years through ages 65 to 70 years with a history of 3 consecutive normal Pap tests.  Fecal occult blood test (FOBT) of stool. / Every year beginning at age 50 years and continuing until age 75 years. You may not need to do this test if you get a colonoscopy every 10 years.  Flexible sigmoidoscopy or colonoscopy.** / Every 5 years for a flexible sigmoidoscopy or every 10 years for a colonoscopy beginning at age 50 years and continuing until age 75 years.  Hepatitis C blood test.** / For all people born from 1945 through  1965 and any individual with known risks for hepatitis C.  Skin self-exam. / Monthly.  Influenza vaccine. / Every year.  Tetanus, diphtheria, and acellular pertussis (Tdap/Td) vaccine.** / Consult your health care provider. Pregnant women should receive 1 dose of Tdap vaccine during each pregnancy. 1 dose of Td every 10 years.  Varicella vaccine.** / Consult your health care provider. Pregnant females who do not have evidence of immunity should receive the first dose after pregnancy.  Zoster vaccine.** / 1 dose for adults aged 60 years or older.  Measles, mumps, rubella (MMR) vaccine.** / You need at least 1 dose of MMR if you were born in 1957 or later. You may also need a 2nd dose. For females of childbearing age, rubella immunity should be determined. If there is no evidence of immunity, females who are not pregnant should be vaccinated. If there is no evidence of immunity, females who are pregnant should delay immunization until after pregnancy.  Pneumococcal 13-valent conjugate (PCV13) vaccine.** / Consult your health care provider.  Pneumococcal polysaccharide (PPSV23) vaccine.** / 1 to 2 doses if you smoke cigarettes or if you have certain conditions.  Meningococcal vaccine.** / Consult your health care provider.  Hepatitis A vaccine.** / Consult your health care provider.  Hepatitis B vaccine.** / Consult your health care provider.  Haemophilus influenzae type b (Hib) vaccine.** / Consult your health care provider. Ages 65   years and over  Blood pressure check.** / Every 1 to 2 years.  Lipid and cholesterol check.** / Every 5 years beginning at age 22 years.  Lung cancer screening. / Every year if you are aged 73-80 years and have a 30-pack-year history of smoking and currently smoke or have quit within the past 15 years. Yearly screening is stopped once you have quit smoking for at least 15 years or develop a health problem that would prevent you from having lung cancer  treatment.  Clinical breast exam.** / Every year after age 4 years.  BRCA-related cancer risk assessment.** / For women who have family members with a BRCA-related cancer (breast, ovarian, tubal, or peritoneal cancers).  Mammogram.** / Every year beginning at age 40 years and continuing for as long as you are in good health. Consult with your health care provider.  Pap test.** / Every 3 years starting at age 9 years through age 34 or 91 years with 3 consecutive normal Pap tests. Testing can be stopped between 65 and 70 years with 3 consecutive normal Pap tests and no abnormal Pap or HPV tests in the past 10 years.  HPV screening.** / Every 3 years from ages 57 years through ages 64 or 45 years with a history of 3 consecutive normal Pap tests. Testing can be stopped between 65 and 70 years with 3 consecutive normal Pap tests and no abnormal Pap or HPV tests in the past 10 years.  Fecal occult blood test (FOBT) of stool. / Every year beginning at age 15 years and continuing until age 17 years. You may not need to do this test if you get a colonoscopy every 10 years.  Flexible sigmoidoscopy or colonoscopy.** / Every 5 years for a flexible sigmoidoscopy or every 10 years for a colonoscopy beginning at age 86 years and continuing until age 71 years.  Hepatitis C blood test.** / For all people born from [birth year] through 1965 and any individual with known risks for hepatitis C.  Osteoporosis screening.** / A one-time screening for women ages 83 years and over and women at risk for fractures or osteoporosis.  Skin self-exam. / Monthly.  Influenza vaccine. / Every year.  Tetanus, diphtheria, and acellular pertussis (Tdap/Td) vaccine.** / 1 dose of Td every 10 years.  Varicella vaccine.** / Consult your health care provider.  Zoster vaccine.** / 1 dose for adults aged 61 years or older.  Pneumococcal 13-valent conjugate (PCV13) vaccine.** / Consult your health care provider.  Pneumococcal  polysaccharide (PPSV23) vaccine.** / 1 dose for all adults aged 28 years and older.  Meningococcal vaccine.** / Consult your health care provider.  Hepatitis A vaccine.** / Consult your health care provider.  Hepatitis B vaccine.** / Consult your health care provider.  Haemophilus influenzae type b (Hib) vaccine.** / Consult your health care provider. ** Family history and personal history of risk and conditions may change your health care provider's recommendations. Document Released: 06/06/2001 Document Revised: 08/25/2013 Document Reviewed: 09/05/2010 [facility] Patient Information 2015 [place], [place]. This information is not intended to replace advice given to you by your health care provider. Make sure you discuss any questions you have with your health care provider.

## 2014-09-11 NOTE — Assessment & Plan Note (Signed)
Continue Relpax prn 

## 2014-10-28 ENCOUNTER — Emergency Department (HOSPITAL_BASED_OUTPATIENT_CLINIC_OR_DEPARTMENT_OTHER)
Admission: EM | Admit: 2014-10-28 | Discharge: 2014-10-28 | Disposition: A | Discharge status: Home / Self Care | Payer: 59 | Attending: Emergency Medicine | Admitting: Emergency Medicine

## 2014-10-28 ENCOUNTER — Encounter (HOSPITAL_BASED_OUTPATIENT_CLINIC_OR_DEPARTMENT_OTHER): Payer: Self-pay | Admitting: *Deleted

## 2014-10-28 DIAGNOSIS — Z862 Personal history of diseases of the blood and blood-forming organs and certain disorders involving the immune mechanism: Secondary | ICD-10-CM | POA: Diagnosis not present

## 2014-10-28 DIAGNOSIS — R220 Localized swelling, mass and lump, head: Secondary | ICD-10-CM | POA: Diagnosis present

## 2014-10-28 DIAGNOSIS — I10 Essential (primary) hypertension: Secondary | ICD-10-CM | POA: Insufficient documentation

## 2014-10-28 DIAGNOSIS — J309 Allergic rhinitis, unspecified: Secondary | ICD-10-CM

## 2014-10-28 DIAGNOSIS — J45909 Unspecified asthma, uncomplicated: Secondary | ICD-10-CM | POA: Diagnosis not present

## 2014-10-28 DIAGNOSIS — Z79899 Other long term (current) drug therapy: Secondary | ICD-10-CM | POA: Diagnosis not present

## 2014-10-28 DIAGNOSIS — J302 Other seasonal allergic rhinitis: Secondary | ICD-10-CM

## 2014-10-28 DIAGNOSIS — R0982 Postnasal drip: Secondary | ICD-10-CM

## 2014-10-28 NOTE — ED Provider Notes (Signed)
CSN: [id number]     Arrival date & time 10/28/14  0855 History   First MD Initiated Contact with Patient 10/28/14 0920     Chief Complaint  Patient presents with  . Oral Swelling     (Consider location/radiation/quality/duration/timing/severity/associated sxs/prior Treatment) Patient is a 39 y.o. female presenting with pharyngitis.  Sore Throat This is a new problem. Episode onset: This morning. The problem occurs constantly. The problem has been resolved. Pertinent negatives include no chest pain and no shortness of breath. Nothing aggravates the symptoms. Relieved by: Gargling water, coughing up mucus.    Past Medical History  Diagnosis Date  . Migraines   . Hypertension     under control with med., has been on med. x 1 yr.  . Allergy-induced asthma     prn inhaler  . Seasonal allergies   . History of anemia     no current problem, per pt.  . Pyogenic granuloma 12/2013    right ring finger   Past Surgical History  Procedure Laterality Date  . Cholecystectomy  2006 or 2007  . Cervical cone biopsy  age 43  . Dilation and evacuation  age 48  . Mass excision Right 01/16/2014    Procedure: EXCISION PYOGENIC GRANULOMA FROM RIGHT RING FINGER;  Surgeon: Dairl Ponder, MD;  Location: [facility];  Service: Orthopedics;  Laterality: Right;  . Dilation and curettage of uterus     Family History  Problem Relation Age of Onset  . Diabetes type II Father   . Hypertension Father   . Hypertension Mother   . Goiter Sister 87    identical twin  . Asthma Sister   . Hypertension Sister   . Stroke Maternal Grandfather   . Diabetes Paternal Grandfather   . Heart disease Paternal Grandfather    History  Substance Use Topics  . Smoking status: Never Smoker   . Smokeless tobacco: Never Used  . Alcohol Use: 0.0 oz/week    0 Standard drinks or equivalent per week     Comment: Wine once a year    OB History    Gravida Para Term Preterm AB TAB SAB Ectopic Multiple Living    Review of Systems  Respiratory: Negative for shortness of breath.   Cardiovascular: Negative for chest pain.  All other systems reviewed and are negative.     Allergies  Ivp dye and Zofran  Home Medications   Prior to Admission medications   Medication Sig Start Date End Date Taking? Authorizing Provider  albuterol (PROVENTIL HFA;VENTOLIN HFA) 108 (90 BASE) MCG/ACT inhaler Inhale 2 puffs into the lungs every 4 (four) hours as needed for shortness of breath (asthma). 05/11/14  Yes Reva Bores, MD  ibuprofen (ADVIL,MOTRIN) 200 MG tablet Take 800 mg by mouth every 6 (six) hours as needed for headache.   Yes Historical Provider, MD  lisinopril-hydrochlorothiazide (PRINZIDE,ZESTORETIC) 10-12.5 MG per tablet TAKE 1 TABLET BY MOUTH ONCE DAILY Patient taking differently: Take 1 tablet by mouth once daily. 04/01/14  Yes Reva Bores, MD  ibuprofen (ADVIL,MOTRIN) 800 MG tablet Take 800 mg by mouth every 8 (eight) hours as needed.    Historical Provider, MD   BP 143/52 mmHg  Pulse 68  Temp(Src) 97.6 F (36.4 C) (Oral)  Resp 16  Ht  (1.676 m)  Wt 300 lb (136.079 kg)  BMI 48.44 kg/m2  SpO2 99%  LMP 10/14/2014 Physical Exam  Constitutional: She is oriented to person, place, and time. She appears well-developed and well-nourished. No distress.  HENT:  Head: Normocephalic and atraumatic.  Nose: Mucosal edema (with congestion) present.  Mouth/Throat: Mucous membranes are normal. No oral lesions. No trismus in the jaw. No uvula swelling. No oropharyngeal exudate, posterior oropharyngeal edema, posterior oropharyngeal erythema or tonsillar abscesses.  Eyes: Conjunctivae are normal. No scleral icterus.  Neck: Neck supple.  Cardiovascular: Normal rate and intact distal pulses.   Pulmonary/Chest: Effort normal. No stridor. No respiratory distress.  Abdominal: Normal appearance. She exhibits no distension.  Neurological: She is alert and oriented to person, place, and  time.  Skin: Skin is warm and dry. No rash noted.  Psychiatric: She has a normal mood and affect. Her behavior is normal.  Nursing note and vitals reviewed.   ED Course  Procedures (including critical care time) Labs Review Labs Reviewed - No data to display  Imaging Review No results found.   EKG Interpretation None      MDM   Final diagnoses:  Seasonal allergies  Allergic rhinitis with postnasal drip    39 year old female who developed her typical allergy symptoms yesterday, who woke up with a feeling of throat swelling this morning. Specifically, she felt that she had a swollen uvula. This improved after gargling water and coughing up mucus. I think her sensation was actually due to postnasal drip. She has no physical exam evidence of throat infection, swelling, or airway compromise. Advised antihistamines and have given return precautions.    Blake DivineJohn Tiaja Hagan, MD 10/28/14 1018

## 2014-10-28 NOTE — ED Notes (Signed)
States allergies were bothering her yesterday day- woke this morning with uvula very swollen per pt report- states it was gagging her- also states she is having nasal congestion and eyes watery

## 2014-10-28 NOTE — Discharge Instructions (Signed)

## 2014-10-28 NOTE — ED Notes (Signed)
D/c home- note for work given- voice clear- no resp distress noted

## 2014-11-02 ENCOUNTER — Encounter: Payer: Self-pay | Admitting: Family Medicine

## 2014-11-02 ENCOUNTER — Ambulatory Visit (INDEPENDENT_AMBULATORY_CARE_PROVIDER_SITE_OTHER): Payer: 59 | Admitting: Family Medicine

## 2014-11-02 VITALS — BP 159/73 | HR 78 | Temp 98.3°F | Ht 66.0 in | Wt 305.6 lb

## 2014-11-02 DIAGNOSIS — J019 Acute sinusitis, unspecified: Secondary | ICD-10-CM | POA: Insufficient documentation

## 2014-11-02 DIAGNOSIS — J0110 Acute frontal sinusitis, unspecified: Secondary | ICD-10-CM

## 2014-11-02 DIAGNOSIS — R110 Nausea: Secondary | ICD-10-CM

## 2014-11-02 MED ORDER — AZITHROMYCIN 250 MG PO TABS: ORAL_TABLET | ORAL | Status: DC

## 2014-11-02 MED ORDER — PROCHLORPERAZINE MALEATE 5 MG PO TABS: 5.0000 mg | ORAL_TABLET | Freq: Three times a day (TID) | ORAL | Status: DC | PRN

## 2014-11-02 NOTE — Assessment & Plan Note (Addendum)
Consistent with subacute rhinosinusitis, likely initially viral URI etiology with improvement now with persistent sinus symptoms, duration >1 week concerning for possible bacterial etiology, also consider likely allergic component. - self discontinued Augmentin course due to concern intolerance with nausea (x 3 days) - Afebrile, well-appearing, sinuses non-tender without purulence  Plan: 1. Discontinue Augmentin (d/t intolerance) 2. Start Azithromycin Z-pak dosing x 5 days 3. Rx Compazine 5-10mg  q 8 hr PRN nausea (should resolve quickly if related to Augmentin) 4. Continue Zyrtec daily 5. Add Nasal saline, consider future flonase 6. Return criteria reviewed

## 2014-11-02 NOTE — Progress Notes (Signed)
   Subjective:    Patient ID: Mary Hanson, female    DOB: 12/11/1975, 39 y.o.   MRN: [id number]  Patient presents for a same day appointment.  HPI  SINUSITIS / NAUSEA: Symptoms started Sunday when woke up with sore throat and some sinus congestion, cough, consistent with previous allergy symptoms. On Wednesday woke up with "swollen uvula", then went to [facility] advised to take benadryl or zyrtec for allergies. By Friday 7/8 worsened with headache and overall continued worsening, went to [facility], dx with Sinusitis given Augmentin 875-125mg  BID x 10 days (took 3 pills), felt significant nausea with some dry heaving without vomiting, tried half pill on Sunday with continued symptoms. Does admit to previously tolerating Amoxicillin fine. - Today currently mildly improved sinus congestion and pressure, resolved sore throat, mild headache, occasional cough (improved, non-productive). Tolerated bland food last night. Continues to complain of mild nausea without vomiting. 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 Normal work of breathing. Abd - soft, NTND, no masses, +active BS Skin - warm, dry, no rashes Neuro - awake, alert       Assessment & Plan:   See specific A&P problem list for details.

## 2014-11-02 NOTE — Patient Instructions (Signed)
Dear Mary Hanson, Thank you for coming in to clinic today  1. I think that this is a Sinusitis, may be bacterial but reassuring that your symptoms are improving. Lets finish antibiotic course with Azithromycin Z pak (2 tabs today then 1 daily for next 4 days). Stop Augmentin - Rx Compazine 5mg  tabs - take 1-2 q 8 hours as needed for nausea - Try to eat small frequent bland meals, improve hydration  Please schedule a follow-up appointment with in 1 week if worsening or not improving.  If you have any other questions or concerns, please feel free to call the clinic to contact me. You may also schedule an earlier appointment if necessary.  However, if your symptoms get significantly worse, please go to the Emergency Department to seek immediate medical attention.  Saralyn PilarAlexander Wm Fruchter, DO [facility]

## 2014-11-11 ENCOUNTER — Ambulatory Visit (INDEPENDENT_AMBULATORY_CARE_PROVIDER_SITE_OTHER): Payer: 59 | Admitting: Neurology

## 2014-11-11 VITALS — BP 126/77 | HR 81 | Temp 97.9°F | Ht 66.0 in | Wt 315.6 lb

## 2014-11-11 DIAGNOSIS — G609 Hereditary and idiopathic neuropathy, unspecified: Secondary | ICD-10-CM

## 2014-11-11 DIAGNOSIS — G43001 Migraine without aura, not intractable, with status migrainosus: Secondary | ICD-10-CM

## 2014-11-11 DIAGNOSIS — M792 Neuralgia and neuritis, unspecified: Secondary | ICD-10-CM | POA: Insufficient documentation

## 2014-11-11 MED ORDER — SUMATRIPTAN SUCCINATE 100 MG PO TABS: 100.0000 mg | ORAL_TABLET | Freq: Once | ORAL | Status: DC

## 2014-11-11 NOTE — Patient Instructions (Signed)
Overall you are doing fairly well but I do want to suggest a few things today:   Remember to drink plenty of fluid, eat healthy meals and do not skip any meals. Try to eat protein with a every meal and eat a healthy snack such as fruit or nuts in between meals. Try to keep a regular sleep-wake schedule and try to exercise daily, particularly in the form of walking, 20-30 minutes a day, if you can.   As far as your medications are concerned, I would like to suggest: At onset of headache take a triptan (relpax, imitrex or Treximet). May repeat in 2 hours. Do not take more than 2 in a day or 10 in a month. May cause nausea. Can also try Cambia at onset of headache. May take with a Triptan.   As far as diagnostic testing:   I would like to see you back as needed, sooner if we need to. Please call us with any interim questions, concerns, problems, updates or refill requests.   Please also call us for any test results so we can go over those with you on the phone.  My clinical assistant and will answer any of your questions and relay your messages to me and also relay most of my messages to you.   Our phone number is [phone]. We also have an after hours call service for urgent matters and there is a physician on-call for urgent questions. For any emergencies you know to call 911 or go to the nearest emergency room

## 2014-11-11 NOTE — Progress Notes (Signed)
[facility]    Provider:  Dr Lucia Gaskins Referring Provider: Reva Bores, MD Primary Care Physician:  Reva Bores, MD  CC: migeaine without aura  Interval update 11/11/2014: No headaches since last being seen. She is going to nursing school. She is scared she will have another migraine. Discussed acute management. No need to start preventative since migraines are not frequent. Will try Cambia and triptan.   Initial visit 08/06/2014: Johanna Matto is a 39 y.o. female here as a referral from Dr. Shawnie Pons for migraine  She has several migraines a year. They are becoming more frequent. This one has lasted several days. She has been to the ED and had a migraine cocktail. She saw a headache specialist at [facility] and she was given prednisone and it didn't help. Regimen: 80mg , 60mg , 40mg , she is on 40mg  now and has a 20mg  left. She is not feeling any better. Migraine started last Thursday and has not gone away. She went in an ambulance last week to [facility]. First migraine was in 2012. She is on Lisinopril. Getting more frequent. Last one 5-6 months ago. The last one she had lasted 5 days. She had to go to the ED twice for the cocktail. The headache starts more on the right, spreads to both sides, +light sensitivity, +sound sensitivity, wants to go into a dark room, turns lights off, no aura. Feels like her scalp burns, tender scalp.   Review of Systems: Patient complains of symptoms per HPI as well as the following symptoms: No headache. No CP, no SOB. Pertinent negatives per HPI. All others negative.   History   Social History  . Marital Status: Single    Spouse Name: N/A  . Number of Children: 0  . Years of Education: Ba/Grad [place]   Occupational History  .  [name]   Social History Main Topics  . Smoking status: Never Smoker   . Smokeless tobacco: Never Used  . Alcohol Use: 0.0 oz/week    0 Standard drinks or equivalent per week     Comment: Wine once a year     . Drug Use: No  . Sexual Activity: Yes    Birth Control/ Protection: Condom     Comment: quit BCP whils on megace 7/14   Other Topics Concern  . Not on file   Social History Narrative   Lives at home with herself.   Right handed.   Caffeine use: Drinks soda daily (Drinks three 20oz per day)       Family History  Problem Relation Age of Onset  . Diabetes type II Father   . Hypertension Father   . Hypertension Mother   . Goiter Sister 52    identical twin  . Asthma Sister   . Hypertension Sister   . Stroke Maternal Grandfather   . Diabetes Paternal Grandfather   . Heart disease Paternal Grandfather     Past Medical History  Diagnosis Date  . Migraines   . Hypertension     under control with med., has been on med. x 1 yr.  . Allergy-induced asthma     prn inhaler  . Seasonal allergies   . History of anemia     no current problem, per pt.  . Pyogenic granuloma 12/2013    right ring finger    Past Surgical History  Procedure Laterality Date  . Cholecystectomy  2006 or 2007  . Cervical cone biopsy  age 80  . Dilation and evacuation  age 39  . Mass excision Right 01/16/2014    Procedure: EXCISION PYOGENIC GRANULOMA FROM RIGHT RING FINGER;  Surgeon: Dairl PonderMatthew Weingold, MD;  Location:  S[facility];  Service: Orthopedics;  Laterality: Right;  . Dilation and curettage of uterus      Current Outpatient Prescriptions  Medication Sig Dispense Refill  . albuterol (PROVENTIL HFA;VENTOLIN HFA) 108 (90 BASE) MCG/ACT inhaler Inhale 2 puffs into the lungs every 4 (four) hours as needed for shortness of breath (asthma). 18 g 2  . ibuprofen (ADVIL,MOTRIN) 200 MG tablet Take 800 mg by mouth every 6 (six) hours as needed for headache.    . lisinopril-hydrochlorothiazide (PRINZIDE,ZESTORETIC) 10-12.5 MG per tablet TAKE 1 TABLET BY MOUTH ONCE DAILY (Patient taking differently: Take 1 tablet by mouth once daily.) 30 tablet 11  . cetirizine (ZYRTEC) 10 MG tablet Take 10 mg by  mouth.    . prochlorperazine (COMPAZINE) 5 MG tablet Take 1-2 tablets (5-10 mg total) by mouth every 8 (eight) hours as needed for nausea or vomiting. (Patient not taking: Reported on 11/11/2014) 30 tablet 0   No current facility-administered medications for this visit.    Allergies as of 11/11/2014 - Review Complete 11/11/2014  Allergen Reaction Noted  . Ivp dye [iodinated diagnostic agents] Nausea And Vomiting 02/01/2013  . Augmentin [amoxicillin-pot clavulanate] Nausea And Vomiting 11/11/2014  . Zofran [ondansetron hcl] Other (See Comments) 01/16/2014    Vitals: BP 126/77 mmHg  Pulse 81  Temp(Src) 97.9 F (36.6 C) (Oral)  Ht 5\' 6"  (1.676 m)  Wt 315 lb 9.6 oz (143.155 kg)  BMI 50.96 kg/m2  LMP 10/14/2014 Last Weight:  Wt Readings from Last 1 Encounters:  11/11/14 315 lb 9.6 oz (143.155 kg)   Last Height:   Ht Readings from Last 1 Encounters:  11/11/14 5\' 6"  (1.676 m)    Physical exam: Exam: Gen: NAD, conversant, well nourised, obese, well groomed                     CV: RRR, no MRG. No Carotid Bruits. No peripheral edema, warm, nontender Eyes: Conjunctivae clear without exudates or hemorrhage  Neuro: Detailed Neurologic Exam  Speech:    Speech is normal; fluent and spontaneous with normal comprehension.  Cognition:    The patient is oriented to person, place, and time;     recent and remote memory intact;     language fluent;     normal attention, concentration,     fund of knowledge Cranial Nerves:    The pupils are equal, round, and reactive to light. The fundi are normal and spontaneous venous pulsations are present. Visual fields are full to finger confrontation. Extraocular movements are intact. Trigeminal sensation is intact and the muscles of mastication are normal. The face is symmetric. The palate elevates in the midline. Hearing intact. Voice is normal. Shoulder shrug is normal. The tongue has normal motion without fasciculations.    Motor Observation:     No asymmetry, no atrophy, and no involuntary movements noted. 5/5 strength.    Assessment/Plan:  39 year old with several migraines a year. None since last appointment. Will concentrate on acute management, no ppx needed at ths time. Gave sample of cambia as well as Treximet with instructions. Will order Cambia due to severe gastroparesis during migraines. Tried imitrex and relpax and didn't work. Use Cambia at onsetof headache. May try Treximet as well but not together. Please take one tablet at the onset of your headache. If it does not  improve the symptoms please take one additional tablet. Do not take more then 2 tablets in 24hrs. Do not take use more then 2 to 3 times in a week. If she wants to try relpax again she can, but she cannot take relpax and tremimet together and do not take more than 10 triptans total a month.   Naomie Dean, MD  [facility] [address] [place], [place] [address]  Phone [phone] Fax [phone]  A total of 15 minutes was spent face-to-face with this patient. Over half this time was spent on counseling patient on the migraine diagnosis and different diagnostic and therapeutic options available.

## 2014-11-12 ENCOUNTER — Encounter: Payer: Self-pay | Admitting: *Deleted

## 2014-11-12 NOTE — Progress Notes (Signed)
Faxed completed Cambia enrollment form to [facility] to [phone]. Received fax confirmation. Sent copy of enrollment form to medical records.

## 2014-11-15 ENCOUNTER — Encounter: Payer: Self-pay | Admitting: Neurology

## 2014-11-15 MED ORDER — DICLOFENAC POTASSIUM(MIGRAINE) 50 MG PO PACK: 50.0000 mg | PACK | Freq: Once | ORAL | Status: DC | PRN

## 2014-11-15 MED ORDER — ELETRIPTAN HYDROBROMIDE 40 MG PO TABS: 40.0000 mg | ORAL_TABLET | ORAL | Status: DC | PRN

## 2014-11-15 MED ORDER — SUMATRIPTAN-NAPROXEN SODIUM 85-500 MG PO TABS: 1.0000 | ORAL_TABLET | ORAL | Status: DC | PRN

## 2014-11-16 ENCOUNTER — Encounter (HOSPITAL_COMMUNITY): Payer: Self-pay | Admitting: *Deleted

## 2014-11-16 ENCOUNTER — Inpatient Hospital Stay (HOSPITAL_COMMUNITY)
Admission: AD | Admit: 2014-11-16 | Discharge: 2014-11-16 | Disposition: A | Discharge status: Home / Self Care | Payer: 59 | Source: Ambulatory Visit | Attending: Obstetrics & Gynecology | Admitting: Obstetrics & Gynecology

## 2014-11-16 DIAGNOSIS — R109 Unspecified abdominal pain: Secondary | ICD-10-CM | POA: Diagnosis not present

## 2014-11-16 DIAGNOSIS — J4520 Mild intermittent asthma, uncomplicated: Secondary | ICD-10-CM | POA: Insufficient documentation

## 2014-11-16 DIAGNOSIS — N898 Other specified noninflammatory disorders of vagina: Secondary | ICD-10-CM | POA: Diagnosis not present

## 2014-11-16 LAB — URINALYSIS, ROUTINE W REFLEX MICROSCOPIC
Bilirubin Urine: NEGATIVE
GLUCOSE, UA: NEGATIVE mg/dL
Hgb urine dipstick: NEGATIVE
Ketones, ur: NEGATIVE mg/dL
Leukocytes, UA: NEGATIVE
NITRITE: NEGATIVE
PH: 5.5 (ref 5.0–8.0)
Protein, ur: NEGATIVE mg/dL
SPECIFIC GRAVITY, URINE: 1.025 (ref 1.005–1.030)
Urobilinogen, UA: 1 mg/dL (ref 0.0–1.0)

## 2014-11-16 LAB — WET PREP, GENITAL
Clue Cells Wet Prep HPF POC: NONE SEEN
Trich, Wet Prep: NONE SEEN
Yeast Wet Prep HPF POC: NONE SEEN

## 2014-11-16 LAB — POCT PREGNANCY, URINE: Preg Test, Ur: NEGATIVE

## 2014-11-16 MED ORDER — ALBUTEROL SULFATE HFA 108 (90 BASE) MCG/ACT IN AERS: 2.0000 | INHALATION_SPRAY | RESPIRATORY_TRACT | Status: DC | PRN

## 2014-11-16 NOTE — MAU Note (Signed)
Pt c/o of possible yeast infection. Denies itching or odor. Also has lower abdominal and vaginal pressure.

## 2014-11-16 NOTE — MAU Provider Note (Signed)
History     CSN: [id number]  Arrival date and time: 11/16/14 2137   First Provider Initiated Contact with Patient 11/16/14 2231      No chief complaint on file.  HPI   Mary Hanson is a 39 y.o. female G1P0010 presenting to MAU with vaginal discharge. When she wipes she sees white discharge on the tissue; this is more discharge than she normally has.  This evening she noticed a large piece of tissue/ discharge on the toilet tissue and this concerned her.  She is also having some vaginal pressure; which started today. She has had this in the past.   OB History    Gravida Para Term Preterm AB TAB SAB Ectopic Multiple Living   0      Past Medical History  Diagnosis Date  . Migraines   . Hypertension     under control with med., has been on med. x 1 yr.  . Allergy-induced asthma     prn inhaler  . Seasonal allergies   . History of anemia     no current problem, per pt.  . Pyogenic granuloma 12/2013    right ring finger    Past Surgical History  Procedure Laterality Date  . Cholecystectomy  2006 or 2007  . Cervical cone biopsy  age 89  . Dilation and evacuation  age 52  . Mass excision Right 01/16/2014    Procedure: EXCISION PYOGENIC GRANULOMA FROM RIGHT RING FINGER;  Surgeon: Dairl Ponder, MD;  Location: [facility];  Service: Orthopedics;  Laterality: Right;  . Dilation and curettage of uterus      Family History  Problem Relation Age of Onset  . Diabetes type II Father   . Hypertension Father   . Hypertension Mother   . Goiter Sister 24    identical twin  . Asthma Sister   . Hypertension Sister   . Stroke Maternal Grandfather   . Diabetes Paternal Grandfather   . Heart disease Paternal Grandfather     History  Substance Use Topics  . Smoking status: Never Smoker   . Smokeless tobacco: Never Used  . Alcohol Use: No     Comment: Wine once a year     Allergies:  Allergies  Allergen Reactions  . Ivp Dye [Iodinated  Diagnostic Agents] Nausea And Vomiting  . Augmentin [Amoxicillin-Pot Clavulanate] Nausea And Vomiting  . Zofran [Ondansetron Hcl] Other (See Comments)    Severe headache    Prescriptions prior to admission  Medication Sig Dispense Refill Last Dose  . albuterol (PROVENTIL HFA;VENTOLIN HFA) 108 (90 BASE) MCG/ACT inhaler Inhale 2 puffs into the lungs every 4 (four) hours as needed for shortness of breath (asthma). 18 g 2 11/15/2014 at Unknown time  . cetirizine (ZYRTEC) 10 MG tablet Take 10 mg by mouth.   Past Week at Unknown time  . ibuprofen (ADVIL,MOTRIN) 200 MG tablet Take 800 mg by mouth every 6 (six) hours as needed for headache.   Past Week at Unknown time  . lisinopril-hydrochlorothiazide (PRINZIDE,ZESTORETIC) 10-12.5 MG per tablet TAKE 1 TABLET BY MOUTH ONCE DAILY (Patient taking differently: Take 1 tablet by mouth once daily.) 30 tablet 11 11/16/2014 at Unknown time  . prochlorperazine (COMPAZINE) 5 MG tablet Take 1-2 tablets (5-10 mg total) by mouth every 8 (eight) hours as needed for nausea or vomiting. 30 tablet 0 Past Month at Unknown time  . Diclofenac Potassium 50 MG PACK Take 50 mg by  mouth once as needed. Take once daily as needed with headache onset. Please take with food 10 each 11 More than a month at Unknown time  . eletriptan (RELPAX) 40 MG tablet Take 1 tablet (40 mg total) by mouth as needed for migraine or headache. May repeat in 2 hours if headache persists or recurs. 10 tablet 0 More than a month at Unknown time  . SUMAtriptan (IMITREX) 100 MG tablet Take 1 tablet (100 mg total) by mouth once. May repeat in 2 hours if headache persists or recurs. 10 tablet 11   . SUMAtriptan-naproxen (TREXIMET) 85-500 MG per tablet Take 1 tablet by mouth every 2 (two) hours as needed for migraine. 10 tablet 0    Results for orders placed or performed during the hospital encounter of 11/16/14 (from the past 48 hour(s))  Urinalysis, Routine w reflex microscopic (not at [facility])     Status: None    Collection Time: 11/16/14  9:55 PM  Result Value Ref Range   Color, Urine YELLOW YELLOW   APPearance CLEAR CLEAR   Specific Gravity, Urine 1.025 1.005 - 1.030   pH 5.5 5.0 - 8.0   Glucose, UA NEGATIVE NEGATIVE mg/dL   Hgb urine dipstick NEGATIVE NEGATIVE   Bilirubin Urine NEGATIVE NEGATIVE   Ketones, ur NEGATIVE NEGATIVE mg/dL   Protein, ur NEGATIVE NEGATIVE mg/dL   Urobilinogen, UA 1.0 0.0 - 1.0 mg/dL   Nitrite NEGATIVE NEGATIVE   Leukocytes, UA NEGATIVE NEGATIVE    Comment: MICROSCOPIC NOT DONE ON URINES WITH NEGATIVE PROTEIN, BLOOD, LEUKOCYTES, NITRITE, OR GLUCOSE <1000 mg/dL.  Pregnancy, urine POC     Status: None   Collection Time: 11/16/14 10:04 PM  Result Value Ref Range   Preg Test, Ur NEGATIVE NEGATIVE    Comment:        THE SENSITIVITY OF THIS METHODOLOGY IS >24 mIU/mL   Wet prep, genital     Status: Abnormal   Collection Time: 11/16/14 10:35 PM  Result Value Ref Range   Yeast Wet Prep HPF POC NONE SEEN NONE SEEN   Trich, Wet Prep NONE SEEN NONE SEEN   Clue Cells Wet Prep HPF POC NONE SEEN NONE SEEN   WBC, Wet Prep HPF POC FEW (A) NONE SEEN    Comment: MANY BACTERIA SEEN    Review of Systems  Constitutional: Negative for fever.  Gastrointestinal: Positive for abdominal pain (suprapubic pain ). Negative for nausea and vomiting.  Genitourinary: Negative for dysuria, urgency, hematuria and flank pain.       Denies vaginitis   Musculoskeletal: Negative for back pain.   Physical Exam   Blood pressure 114/63, pulse 82, temperature 97.8 F (36.6 C), temperature source Oral, resp. rate 16, height 5\' 6"  (1.676 m), weight 142.52 kg (314 lb 3.2 oz), last menstrual period 11/05/2014.  Physical Exam  Constitutional: She is oriented to person, place, and time. She appears well-developed and well-nourished. No distress.  HENT:  Head: Normocephalic.  Eyes: Pupils are equal, round, and reactive to light.  GI: Soft. There is tenderness in the suprapubic area. There is no  rigidity, no rebound and no guarding.  Genitourinary:  Speculum exam: Vagina - Small amount of creamy, white discharge, no odor Cervix - No contact bleeding Bimanual exam: Cervix closed Uterus non tender, normal size, +suprapubic tenderness  Adnexa non tender, no masses bilaterally GC/Chlam, wet prep done Chaperone present for exam.  Musculoskeletal: Normal range of motion.  Neurological: She is alert and oriented to person, place, and time.  Skin:  Skin is warm. She is not diaphoretic.  Psychiatric: Her behavior is normal.    MAU Course  Procedures  MDM  GC pending   Assessment and Plan   A:  1. Vaginal discharge   2. Asthma, mild intermittent, uncomplicated; no symptoms currently   3. Abdominal pain, unspecified abdominal location    P:  Discharge home in stable condition Follow up with clinic as needed Return to MAU for emergencies Condoms always If vaginitis, ok to pick up monistat 7 over the counter as directed  RX: Albuterol; patient is out of her RX     Duane Lope, NP 11/16/2014 10:45 PM

## 2014-11-17 LAB — GC/CHLAMYDIA PROBE AMP (~~LOC~~) NOT AT ARMC
Chlamydia: NEGATIVE
NEISSERIA GONORRHEA: NEGATIVE

## 2015-01-04 ENCOUNTER — Emergency Department (HOSPITAL_BASED_OUTPATIENT_CLINIC_OR_DEPARTMENT_OTHER)
Admission: EM | Admit: 2015-01-04 | Discharge: 2015-01-04 | Disposition: A | Discharge status: Home / Self Care | Payer: 59 | Attending: Emergency Medicine | Admitting: Emergency Medicine

## 2015-01-04 ENCOUNTER — Encounter (HOSPITAL_BASED_OUTPATIENT_CLINIC_OR_DEPARTMENT_OTHER): Payer: Self-pay | Admitting: Emergency Medicine

## 2015-01-04 DIAGNOSIS — G43809 Other migraine, not intractable, without status migrainosus: Secondary | ICD-10-CM | POA: Insufficient documentation

## 2015-01-04 DIAGNOSIS — J45909 Unspecified asthma, uncomplicated: Secondary | ICD-10-CM | POA: Insufficient documentation

## 2015-01-04 DIAGNOSIS — I10 Essential (primary) hypertension: Secondary | ICD-10-CM | POA: Insufficient documentation

## 2015-01-04 DIAGNOSIS — Z862 Personal history of diseases of the blood and blood-forming organs and certain disorders involving the immune mechanism: Secondary | ICD-10-CM | POA: Insufficient documentation

## 2015-01-04 DIAGNOSIS — Z79899 Other long term (current) drug therapy: Secondary | ICD-10-CM | POA: Insufficient documentation

## 2015-01-04 DIAGNOSIS — Z872 Personal history of diseases of the skin and subcutaneous tissue: Secondary | ICD-10-CM | POA: Insufficient documentation

## 2015-01-04 MED ORDER — ENALAPRILAT 1.25 MG/ML IV SOLN
1.2500 mg | Freq: Once | INTRAVENOUS | Status: AC
  Administered 2015-01-04: 1.25 mg via INTRAVENOUS
  Filled 2015-01-04: qty 2

## 2015-01-04 MED ORDER — METOCLOPRAMIDE HCL 5 MG/ML IJ SOLN
10.0000 mg | Freq: Once | INTRAMUSCULAR | Status: AC
  Administered 2015-01-04: 10 mg via INTRAVENOUS
  Filled 2015-01-04: qty 2

## 2015-01-04 MED ORDER — KETOROLAC TROMETHAMINE 30 MG/ML IJ SOLN
30.0000 mg | Freq: Once | INTRAMUSCULAR | Status: AC
  Administered 2015-01-04: 30 mg via INTRAVENOUS
  Filled 2015-01-04: qty 1

## 2015-01-04 MED ORDER — DIPHENHYDRAMINE HCL 50 MG/ML IJ SOLN
25.0000 mg | Freq: Once | INTRAMUSCULAR | Status: AC
  Administered 2015-01-04: 25 mg via INTRAVENOUS
  Filled 2015-01-04: qty 1

## 2015-01-04 MED ORDER — SODIUM CHLORIDE 0.9 % IV BOLUS (SEPSIS)
1000.0000 mL | Freq: Once | INTRAVENOUS | Status: AC
  Administered 2015-01-04: 1000 mL via INTRAVENOUS

## 2015-01-04 NOTE — ED Notes (Signed)
Headache,migraine, x several days

## 2015-01-04 NOTE — ED Provider Notes (Signed)
CSN: [id number]     Arrival date & time 01/04/15  0708 History   First MD Initiated Contact with Patient 01/04/15 [phone]     Chief Complaint  Patient presents with  . Migraine      HPI  Patient presents for evaluation of a headache. Has history of migraine headaches. Follows with [facility]. Had been getting them a little more frequently through the summer. About 6 weeks ago was given prescription for sumatriptan, and Relpax. States that she took a dose this morning and a proximal 40 4 AM. States it has not relieved her headache. Onset of headache was slow on Friday, 3 days ago. Has tried several over-the-counter medications or diclofenac until this morning. Also is out of her lisinopril. States her physician called in a prescription for her on Friday and she intended to get it today. No unusual symptoms for her. Frontal throbbing headache with some photosensitivity and vomiting. She is on her menstrual cycle that started Thursday this is a typical trigger for her. Otherwise no unusual symptoms.   Past Medical History  Diagnosis Date  . Migraines   . Hypertension     under control with med., has been on med. x 1 yr.  . Allergy-induced asthma     prn inhaler  . Seasonal allergies   . History of anemia     no current problem, per pt.  . Pyogenic granuloma 12/2013    right ring finger   Past Surgical History  Procedure Laterality Date  . Cholecystectomy  2006 or 2007  . Cervical cone biopsy  age 68  . Dilation and evacuation  age 4  . Mass excision Right 01/16/2014    Procedure: EXCISION PYOGENIC GRANULOMA FROM RIGHT RING FINGER;  Surgeon: Dairl Ponder, MD;  Location: [facility];  Service: Orthopedics;  Laterality: Right;  . Dilation and curettage of uterus     Family History  Problem Relation Age of Onset  . Diabetes type II Father   . Hypertension Father   . Hypertension Mother   . Goiter Sister 26    identical twin  . Asthma Sister    . Hypertension Sister   . Stroke Maternal Grandfather   . Diabetes Paternal Grandfather   . Heart disease Paternal Grandfather    Social History  Substance Use Topics  . Smoking status: Never Smoker   . Smokeless tobacco: Never Used  . Alcohol Use: No     Comment: Wine once a year    OB History    Gravida Para Term Preterm AB TAB SAB Ectopic Multiple Living   0     Review of Systems  Constitutional: Negative for fever, chills, diaphoresis, appetite change and fatigue.  HENT: Negative for mouth sores, sore throat and trouble swallowing.   Eyes: Positive for photophobia. Negative for visual disturbance.  Respiratory: Negative for cough, chest tightness, shortness of breath and wheezing.   Cardiovascular: Negative for chest pain.  Gastrointestinal: Positive for nausea and vomiting. Negative for abdominal pain, diarrhea and abdominal distention.  Endocrine: Negative for polydipsia, polyphagia and polyuria.  Genitourinary: Negative for dysuria, frequency and hematuria.  Musculoskeletal: Negative for gait problem.  Skin: Negative for color change, pallor and rash.  Neurological: Positive for headaches. Negative for dizziness, syncope and light-headedness.  Hematological: Does not bruise/bleed easily.  Psychiatric/Behavioral: Negative for behavioral problems and confusion.      Allergies  Ivp dye; Augmentin; and Zofran  Home Medications   Prior to Admission medications   Medication Sig Start Date End Date Taking? Authorizing Provider  albuterol (PROVENTIL HFA;VENTOLIN HFA) 108 (90 BASE) MCG/ACT inhaler Inhale 2 puffs into the lungs every 4 (four) hours as needed for shortness of breath (asthma). 11/16/14   Duane Lope, NP  cetirizine (ZYRTEC) 10 MG tablet Take 10 mg by mouth.    Historical Provider, MD  Diclofenac Potassium 50 MG PACK Take 50 mg by mouth once as needed. Take once daily as needed with headache onset. Please take with food 11/15/14   Anson Fret, MD  eletriptan (RELPAX) 40 MG tablet Take 1 tablet (40 mg total) by mouth as needed for migraine or headache. May repeat in 2 hours if headache persists or recurs. 11/15/14   Anson Fret, MD  ibuprofen (ADVIL,MOTRIN) 200 MG tablet Take 800 mg by mouth every 6 (six) hours as needed for headache.    Historical Provider, MD  lisinopril-hydrochlorothiazide (PRINZIDE,ZESTORETIC) 10-12.5 MG per tablet TAKE 1 TABLET BY MOUTH ONCE DAILY Patient taking differently: Take 1 tablet by mouth once daily. 04/01/14   Reva Bores, MD  prochlorperazine (COMPAZINE) 5 MG tablet Take 1-2 tablets (5-10 mg total) by mouth every 8 (eight) hours as needed for nausea or vomiting. 11/02/14   Smitty Cords, DO  SUMAtriptan (IMITREX) 100 MG tablet Take 1 tablet (100 mg total) by mouth once. May repeat in 2 hours if headache persists or recurs. 11/11/14   Anson Fret, MD  SUMAtriptan-naproxen (TREXIMET) 85-500 MG per tablet Take 1 tablet by mouth every 2 (two) hours as needed for migraine. 11/15/14   Anson Fret, MD   BP 174/103 mmHg  Pulse 68  Temp(Src) 97.8 F (36.6 C) (Oral)  Resp 18  Ht 5\' 6"  (1.676 m)  Wt 300 lb (136.079 kg)  BMI 48.44 kg/m2  SpO2 100% Physical Exam  Constitutional: She is oriented to person, place, and time. She appears well-developed and well-nourished. No distress.  HENT:  Head: Normocephalic.  Eyes: Conjunctivae are normal. Pupils are equal, round, and reactive to light. No scleral icterus.  Neck: Normal range of motion. Neck supple. No thyromegaly present.  Cardiovascular: Normal rate and regular rhythm.  Exam reveals no gallop and no friction rub.   No murmur heard. Pulmonary/Chest: Effort normal and breath sounds normal. No respiratory distress. She has no wheezes. She has no rales.  Abdominal: Soft. Bowel sounds are normal. She exhibits no distension. There is no tenderness. There is no rebound.  Musculoskeletal: Normal range of motion.  Neurological: She is  alert and oriented to person, place, and time.  Skin: Skin is warm and dry. No rash noted.  Psychiatric: She has a normal mood and affect. Her behavior is normal.    ED Course  Procedures (including critical care time) Labs Review Labs Reviewed - No data to display  Imaging Review No results found. I have personally reviewed and evaluated these images and lab results as part of my medical decision-making.   EKG Interpretation None      MDM   Final diagnoses:  Other migraine without status migrainosus, not intractable    Plan is fluids, Toradol, Reglan, Benadryl. Reevaluation. She historically has responded to this in the past.    Rolland Porter, MD 01/04/15 [phone]

## 2015-01-04 NOTE — Discharge Instructions (Signed)
If you really developed a headache, usual prescribed medications early to abort the headache.  Migraine Headache A migraine headache is very bad, throbbing pain on one or both sides of your head. Talk to your doctor about what things may bring on (trigger) your migraine headaches. HOME CARE  Only take medicines as told by your doctor.  Lie down in a dark, quiet room when you have a migraine.  Keep a journal to find out if certain things bring on migraine headaches. For example, write down:  What you eat and drink.  How much sleep you get.  Any change to your diet or medicines.  Lessen how much alcohol you drink.  Quit smoking if you smoke.  Get enough sleep.  Lessen any stress in your life.  Keep lights dim if bright lights bother you or make your migraines worse. GET HELP RIGHT AWAY IF:   Your migraine becomes really bad.  You have a fever.  You have a stiff neck.  You have trouble seeing.  Your muscles are weak, or you lose muscle control.  You lose your balance or have trouble walking.  You feel like you will pass out (faint), or you pass out.  You have really bad symptoms that are different than your first symptoms. MAKE SURE YOU:   Understand these instructions.  Will watch your condition.  Will get help right away if you are not doing well or get worse. Document Released: 01/18/2008 Document Revised: 07/03/2011 Document Reviewed: 12/16/2012 [facility] Patient Information 2015 [place], [place]. This information is not intended to replace advice given to you by your health care provider. Make sure you discuss any questions you have with your health care provider.

## 2015-01-10 ENCOUNTER — Encounter (HOSPITAL_BASED_OUTPATIENT_CLINIC_OR_DEPARTMENT_OTHER): Payer: Self-pay | Admitting: *Deleted

## 2015-01-10 ENCOUNTER — Emergency Department (HOSPITAL_BASED_OUTPATIENT_CLINIC_OR_DEPARTMENT_OTHER)
Admission: EM | Admit: 2015-01-10 | Discharge: 2015-01-11 | Disposition: A | Discharge status: Home / Self Care | Payer: 59 | Attending: Emergency Medicine | Admitting: Emergency Medicine

## 2015-01-10 DIAGNOSIS — J45909 Unspecified asthma, uncomplicated: Secondary | ICD-10-CM | POA: Insufficient documentation

## 2015-01-10 DIAGNOSIS — Z862 Personal history of diseases of the blood and blood-forming organs and certain disorders involving the immune mechanism: Secondary | ICD-10-CM | POA: Insufficient documentation

## 2015-01-10 DIAGNOSIS — I10 Essential (primary) hypertension: Secondary | ICD-10-CM | POA: Insufficient documentation

## 2015-01-10 DIAGNOSIS — T783XXA Angioneurotic edema, initial encounter: Secondary | ICD-10-CM | POA: Insufficient documentation

## 2015-01-10 DIAGNOSIS — Z79899 Other long term (current) drug therapy: Secondary | ICD-10-CM | POA: Insufficient documentation

## 2015-01-10 DIAGNOSIS — K130 Diseases of lips: Secondary | ICD-10-CM | POA: Insufficient documentation

## 2015-01-10 DIAGNOSIS — Y998 Other external cause status: Secondary | ICD-10-CM | POA: Insufficient documentation

## 2015-01-10 DIAGNOSIS — G43909 Migraine, unspecified, not intractable, without status migrainosus: Secondary | ICD-10-CM | POA: Insufficient documentation

## 2015-01-10 DIAGNOSIS — Y9389 Activity, other specified: Secondary | ICD-10-CM | POA: Insufficient documentation

## 2015-01-10 DIAGNOSIS — Y9289 Other specified places as the place of occurrence of the external cause: Secondary | ICD-10-CM | POA: Insufficient documentation

## 2015-01-10 DIAGNOSIS — Z872 Personal history of diseases of the skin and subcutaneous tissue: Secondary | ICD-10-CM | POA: Insufficient documentation

## 2015-01-10 DIAGNOSIS — X58XXXA Exposure to other specified factors, initial encounter: Secondary | ICD-10-CM | POA: Insufficient documentation

## 2015-01-10 MED ORDER — HYDROCHLOROTHIAZIDE 12.5 MG PO TABS: 12.5000 mg | ORAL_TABLET | Freq: Every day | ORAL | Status: DC

## 2015-01-10 NOTE — ED Notes (Signed)
Pt reports she woke up Tuesday morning and lips were swollen and took a Benadryl (reports improvement); Reports she also took leftover Amoxicillin from a sinus infection because she thought her lips might be infected. Denies fever, sob, tongue swelling.

## 2015-01-10 NOTE — Discharge Instructions (Signed)
Please stop taking your lisinopril. I recommend you follow up with your primary care doctor for further blood pressure medications and management. If you develop lip swelling again or difficulty breathing, wheezing, tongue swelling, difficulty speaking or swallowing, please take Benadryl 50 mg and return to the emergency department. You may use over-the-counter Chapstick such as blistek or carmex.  You may also use over-the-counter Orajel as needed for pain and alternate between Tylenol 1000 mg every 6 hours as needed for pain and ibuprofen 800 mg every 8 hours as needed for pain.   Resolved Angioedema Angioedema is a sudden swelling of tissues, often of the skin. It can occur on the face or genitals or in the abdomen or other body parts. The swelling usually develops over a short period and gets better in 24 to 48 hours. It often begins during the night and is found when the person wakes up. The person may also get red, itchy patches of skin (hives). Angioedema can be dangerous if it involves swelling of the air passages.  Depending on the cause, episodes of angioedema may only happen once, come back in unpredictable patterns, or repeat for several years and then gradually fade away.  CAUSES  Angioedema can be caused by an allergic reaction to various triggers. It can also result from nonallergic causes, including reactions to drugs, immune system disorders, viral infections, or an abnormal gene that is passed to you from your parents (hereditary). For some people with angioedema, the cause is unknown.  Some things that can trigger angioedema include:   Foods.   Medicines, such as ACE inhibitors, ARBs, nonsteroidal anti-inflammatory agents, or estrogen.   Latex.   Animal saliva.   Insect stings.   Dyes used in X-rays.   Mild injury.   Dental work.  Surgery.  Stress.   Sudden changes in temperature.   Exercise. SIGNS AND SYMPTOMS   Swelling of the skin.  Hives. If these  are present, there is also intense itching.  Redness in the affected area.   Pain in the affected area.  Swollen lips or tongue.  Breathing problems. This may happen if the air passages swell.  Wheezing. If internal organs are involved, there may be:   Nausea.   Abdominal pain.   Vomiting.   Difficulty swallowing.   Difficulty passing urine. DIAGNOSIS   Your health care provider will examine the affected area and take a medical and family history.  Various tests may be done to help determine the cause. Tests may include:  Allergy skin tests to see if the problem is an allergic reaction.   Blood tests to check for hereditary angioedema.   Tests to check for underlying diseases that could cause the condition.   A review of your medicines, including over-the-counter medicines, may be done. TREATMENT  Treatment will depend on the cause of the angioedema. Possible treatments include:   Removal of anything that triggered the condition (such as stopping certain medicines).   Medicines to treat symptoms or prevent attacks. Medicines given may include:   Antihistamines.   Epinephrine injection.   Steroids.   Hospitalization may be required for severe attacks. If the air passages are affected, it can be an emergency. Tubes may need to be placed to keep the airway open. HOME CARE INSTRUCTIONS   Take all medicines as directed by your health care provider.  If you were given medicines for emergency allergy treatment, always carry them with you.  Wear a medical bracelet as directed by your  health care provider.   Avoid known triggers. SEEK MEDICAL CARE IF:   You have repeat attacks of angioedema.   Your attacks are more frequent or more severe despite preventive measures.   You have hereditary angioedema and are considering having children. It is important to discuss with your health care provider the risks of passing the condition on to your  children. SEEK IMMEDIATE MEDICAL CARE IF:   You have severe swelling of the mouth, tongue, or lips.  You have difficulty breathing.   You have difficulty swallowing.   You faint. MAKE SURE YOU:  Understand these instructions.  Will watch your condition.  Will get help right away if you are not doing well or get worse. Document Released: 06/19/2001 Document Revised: 08/25/2013 Document Reviewed: 12/02/2012 [facility] Patient Information 2015 [place], [place]. This information is not intended to replace advice given to you by your health care provider. Make sure you discuss any questions you have with your health care provider.

## 2015-01-10 NOTE — ED Provider Notes (Signed)
TIME SEEN: 11:45 PM  CHIEF COMPLAINT: Dry, burning lips  HPI: Pt is a 39 y.o. female with history of hypertension, migraines who presents to the emergency department with complaints of dry, burning lips. Patient was seen in the emergency department on September 12 for migraine headache. She states the next day she woke up and her legs are very swollen. Reports she took Benadryl at home which resolved her lip swelling. She states she was also concerned that her lips could have been infected although she denies fever, erythema or warmth and she began taking amoxicillin that she had left over for a previous sinus infection. States that since this time she has noticed her lips have been more dry than normal, feel irritated and are burning. No swelling since September 13. No tongue swelling. No difficulty breathing, wheezing. No rash. No lesions inside the mouth or on the tongue. No facial erythema, warmth or swelling. No associated fever.  ROS: See HPI Constitutional: no fever  Eyes: no drainage  ENT: no runny nose   Cardiovascular:  no chest pain  Resp: no SOB  GI: no vomiting GU: no dysuria Integumentary: no rash  Allergy: no hives  Musculoskeletal: no leg swelling  Neurological: no slurred speech ROS otherwise negative  PAST MEDICAL HISTORY/PAST SURGICAL HISTORY:  Past Medical History  Diagnosis Date  . Migraines   . Hypertension     under control with med., has been on med. x 1 yr.  . Allergy-induced asthma     prn inhaler  . Seasonal allergies   . History of anemia     no current problem, per pt.  . Pyogenic granuloma 12/2013    right ring finger    MEDICATIONS:  Prior to Admission medications   Medication Sig Start Date End Date Taking? Authorizing Provider  albuterol (PROVENTIL HFA;VENTOLIN HFA) 108 (90 BASE) MCG/ACT inhaler Inhale 2 puffs into the lungs every 4 (four) hours as needed for shortness of breath (asthma). 11/16/14  Yes Duane Lope, NP  cetirizine (ZYRTEC) 10  MG tablet Take 10 mg by mouth.   Yes Historical Provider, MD  Diclofenac Potassium 50 MG PACK Take 50 mg by mouth once as needed. Take once daily as needed with headache onset. Please take with food 11/15/14  Yes Anson Fret, MD  eletriptan (RELPAX) 40 MG tablet Take 1 tablet (40 mg total) by mouth as needed for migraine or headache. May repeat in 2 hours if headache persists or recurs. 11/15/14  Yes Anson Fret, MD  ibuprofen (ADVIL,MOTRIN) 200 MG tablet Take 800 mg by mouth every 6 (six) hours as needed for headache.   Yes Historical Provider, MD  lisinopril-hydrochlorothiazide (PRINZIDE,ZESTORETIC) 10-12.5 MG per tablet TAKE 1 TABLET BY MOUTH ONCE DAILY Patient taking differently: Take 1 tablet by mouth once daily. 04/01/14  Yes Reva Bores, MD  prochlorperazine (COMPAZINE) 5 MG tablet Take 1-2 tablets (5-10 mg total) by mouth every 8 (eight) hours as needed for nausea or vomiting. 11/02/14  Yes Smitty Cords, DO  SUMAtriptan (IMITREX) 100 MG tablet Take 1 tablet (100 mg total) by mouth once. May repeat in 2 hours if headache persists or recurs. 11/11/14  Yes Anson Fret, MD  SUMAtriptan-naproxen (TREXIMET) 85-500 MG per tablet Take 1 tablet by mouth every 2 (two) hours as needed for migraine. 11/15/14  Yes Anson Fret, MD    ALLERGIES:  Allergies  Allergen Reactions  . Ivp Dye [Iodinated Diagnostic Agents] Nausea And Vomiting  . Augmentin [  Amoxicillin-Pot Clavulanate] Nausea And Vomiting  . Zofran [Ondansetron Hcl] Other (See Comments)    Severe headache    SOCIAL HISTORY:  Social History  Substance Use Topics  . Smoking status: Never Smoker   . Smokeless tobacco: Never Used  . Alcohol Use: No     Comment: Wine once a year     FAMILY HISTORY: Family History  Problem Relation Age of Onset  . Diabetes type II Father   . Hypertension Father   . Hypertension Mother   . Goiter Sister 35    identical twin  . Asthma Sister   . Hypertension Sister   . Stroke  Maternal Grandfather   . Diabetes Paternal Grandfather   . Heart disease Paternal Grandfather     EXAM: BP 132/68 mmHg  Pulse 79  Temp(Src) 98 F (36.7 C) (Oral)  Resp 20  Ht  (1.676 m)  Wt 300 lb (136.079 kg)  BMI 48.44 kg/m2  SpO2 100%  LMP 12/20/2014 (Approximate) CONSTITUTIONAL: Alert and oriented and responds appropriately to questions. Well-appearing; well-nourished HEAD: Normocephalic EYES: Conjunctivae clear, PERRL ENT: normal nose; no rhinorrhea; moist mucous membranes; pharynx without lesions noted, patient's lips appear normal and may be slightly dry but are not erythematous, cracked, warm; no sign of facial cellulitis, no dental abscess or dental caries, no Ludwig's angina, no angioedema, tongue sits flat in the bottom of the mouth, no stridor, normal phonation, posterior oropharynx is widely patent, no tonsillar hypertrophy or exudate, no uvular deviation NECK: Supple, no meningismus, no LAD; full range of motion in the neck which is painless CARD: RRR; S1 and S2 appreciated; no murmurs, no clicks, no rubs, no gallops RESP: Normal chest excursion without splinting or tachypnea; breath sounds clear and equal bilaterally; no wheezes, no rhonchi, no rales, no hypoxia or respiratory distress, speaking full sentences ABD/GI: Normal bowel sounds; non-distended; soft, non-tender, no rebound, no guarding, no peritoneal signs BACK:  The back appears normal and is non-tender to palpation, there is no CVA tenderness EXT: Normal ROM in all joints; non-tender to palpation; no edema; normal capillary refill; no cyanosis, no calf tenderness or swelling    SKIN: Normal color for age and race; warm, no rash or urticaria NEURO: Moves all extremities equally, sensation to light touch intact diffusely, cranial nerves II through XII intact PSYCH: The patient's mood and manner are appropriate. Grooming and personal hygiene are appropriate.  MEDICAL DECISION MAKING: Patient here with  complaints of dry cracked painful lips. Occurred after an episode of angioedema that was 6 days ago. No lip or tongue swelling since. She is on lisinopril and have advised her to stop this medication. Given symptoms were 6 days ago and do not feel she needs to be on any steroid. She states that she feels her lips are their normal size. No signs of any facial cellulitis, dental infection, Ludwig's angina. I do not feel patient needs to be on antibiotics at this time. Have recommended patient continue over-the-counter Chapstick for her dry lips. Discussed with her that she can use Tylenol and Motrin for pain as well as Orajel as needed. Discussed with patient that she should follow-up with her primary care physician given we are stopping her lisinopril. She was on a combination medication with lisinopril and hydrochlorothiazide. I have given her prescription for her hydrochlorothiazide today. Discussed with her strict return precautions. I do not feel at this time she needs further emergent workup. She verbalized understanding and discomfort with this plan.  Layla Maw Ward, DO 01/11/15 [phone]

## 2015-01-11 ENCOUNTER — Ambulatory Visit (INDEPENDENT_AMBULATORY_CARE_PROVIDER_SITE_OTHER): Payer: Self-pay | Admitting: Family Medicine

## 2015-01-11 ENCOUNTER — Encounter: Payer: Self-pay | Admitting: Family Medicine

## 2015-01-11 VITALS — BP 154/113 | HR 84 | Temp 97.6°F | Ht 66.0 in | Wt 310.2 lb

## 2015-01-11 DIAGNOSIS — I10 Essential (primary) hypertension: Secondary | ICD-10-CM

## 2015-01-11 NOTE — Progress Notes (Signed)
   Subjective:   Mary Hanson is a 39 y.o. female with a history of  Here for  Chief Complaint  Patient presents with  . Oral Swelling    lips swelling due to bp medication   #Lip swelling - started 2 days ago - Reports one issue last week of lip swelling but thought it might be related to a chapstick but she had used this lip product before and never had this reaction.  -tried benadryl and amoxicillin at home which did not help - no new lip products, lotions - has been lisinopril-HCTZ for 4- 5 yrs - last took medication 2 days ago  #HTN- tried norvasc for 1 week- caused he to bottom out. Has not tried HCTZ  Health Maintenance Due  Topic Date Due  . INFLUENZA VACCINE  11/23/2014   Review of Systems:   Per HPI. All other systems reviewed and are negative expect as in HPI   PMH, PSH, Medications, Allergies, SocialHx and FHx reviewed and updated in EMR- marked as reviewed 01/11/2015   Objective:  BP 154/113 mmHg  Pulse 84  Temp(Src) 97.6 F (36.4 C) (Oral)  Ht  (1.676 m)  Wt 310 lb 3.2 oz (140.706 kg)  BMI 50.09 kg/m2  LMP 12/20/2014 (Approximate)  Gen:  39 y.o. female in NAD. Speaking in full sentences. Good eye contact HEENT: NCAT. Mild swelling in lower lip.  ZO:XWRUEAV rate Resp: Normal WOB. Non-labored, CTAB, no wheezes noted GI: Soft, NTND, BS present, no guarding or organomegaly Ext: WWP, no edema MSK: Intact gait. Full ROM Neuro: Alert and oriented, speech normal Pysch: Normal mood and affect.       Chemistry      Component Value Date/Time   NA 137 05/11/2014 1559   K 3.9 05/11/2014 1559   CL 102 05/11/2014 1559   CO2 28 05/11/2014 1559   BUN 9 05/11/2014 1559   CREATININE 0.77 05/11/2014 1559   CREATININE 0.76 01/14/2014 1630      Component Value Date/Time   CALCIUM 9.0 05/11/2014 1559   ALKPHOS 89 05/11/2014 1559   AST 12 05/11/2014 1559   ALT 11 05/11/2014 1559   BILITOT 0.3 05/11/2014 1559      No results found for:  HGBA1C Assessment:     Mary Hanson is a 39 y.o. female here for lip swelling    Plan:   #Lip swelling, Likely related to ACE-i - can no longer take ACE or ARB 2/2 to angioedema - Discussed likely complete resolution in 48 hours - Discussed reasons to return to ER  #HTN - will trial HCTZ for Bp control - RTC in 2 weeks for BP check, if elevated increase to HCTZ 25 mg  Federico Flake, MD, [facility] Fellow, [facility] 01/11/2015  4:45 PM

## 2015-01-11 NOTE — Patient Instructions (Signed)
Angioedema °Angioedema is a sudden swelling of tissues, often of the skin. It can occur on the face or genitals or in the abdomen or other body parts. The swelling usually develops over a short period and gets better in 24 to 48 hours. It often begins during the night and is found when the person wakes up. The person may also get red, itchy patches of skin (hives). Angioedema can be dangerous if it involves swelling of the air passages.  °Depending on the cause, episodes of angioedema may only happen once, come back in unpredictable patterns, or repeat for several years and then gradually fade away.  °CAUSES  °Angioedema can be caused by an allergic reaction to various triggers. It can also result from nonallergic causes, including reactions to drugs, immune system disorders, viral infections, or an abnormal gene that is passed to you from your parents (hereditary). For some people with angioedema, the cause is unknown.  °Some things that can trigger angioedema include:  °· Foods.   °· Medicines, such as ACE inhibitors, ARBs, nonsteroidal anti-inflammatory agents, or estrogen.   °· Latex.   °· Animal saliva.   °· Insect stings.   °· Dyes used in X-rays.   °· Mild injury.   °· Dental work. °· Surgery. °· Stress.   °· Sudden changes in temperature.   °· Exercise. °SIGNS AND SYMPTOMS  °· Swelling of the skin. °· Hives. If these are present, there is also intense itching. °· Redness in the affected area.   °· Pain in the affected area. °· Swollen lips or tongue. °· Breathing problems. This may happen if the air passages swell. °· Wheezing. °If internal organs are involved, there may be:  °· Nausea.   °· Abdominal pain.   °· Vomiting.   °· Difficulty swallowing.   °· Difficulty passing urine. °DIAGNOSIS  °· Your health care provider will examine the affected area and take a medical and family history. °· Various tests may be done to help determine the cause. Tests may include: °¨ Allergy skin tests to see if the problem  is an allergic reaction.   °¨ Blood tests to check for hereditary angioedema.   °¨ Tests to check for underlying diseases that could cause the condition.   °· A review of your medicines, including over-the-counter medicines, may be done. °TREATMENT  °Treatment will depend on the cause of the angioedema. Possible treatments include:  °· Removal of anything that triggered the condition (such as stopping certain medicines).   °· Medicines to treat symptoms or prevent attacks. Medicines given may include:   °¨ Antihistamines.   °¨ Epinephrine injection.   °¨ Steroids.   °· Hospitalization may be required for severe attacks. If the air passages are affected, it can be an emergency. Tubes may need to be placed to keep the airway open. °HOME CARE INSTRUCTIONS  °· Take all medicines as directed by your health care provider. °· If you were given medicines for emergency allergy treatment, always carry them with you. °· Wear a medical bracelet as directed by your health care provider.   °· Avoid known triggers. °SEEK MEDICAL CARE IF:  °· You have repeat attacks of angioedema.   °· Your attacks are more frequent or more severe despite preventive measures.   °· You have hereditary angioedema and are considering having children. It is important to discuss with your health care provider the risks of passing the condition on to your children. °SEEK IMMEDIATE MEDICAL CARE IF:  °· You have severe swelling of the mouth, tongue, or lips. °· You have difficulty breathing.   °· You have difficulty swallowing.   °· You faint. °MAKE   SURE YOU: °· Understand these instructions. °· Will watch your condition. °· Will get help right away if you are not doing well or get worse. °Document Released: 06/19/2001 Document Revised: 08/25/2013 Document Reviewed: 12/02/2012 °[employer]® Patient Information ©2015 [employer]. This information is not intended to replace advice given to you by your health care provider. Make sure you discuss any questions  you have with your health care provider. ° °

## 2015-01-25 ENCOUNTER — Ambulatory Visit: Payer: 59 | Admitting: Family Medicine

## 2015-01-27 ENCOUNTER — Encounter: Payer: Self-pay | Admitting: Family Medicine

## 2015-01-27 ENCOUNTER — Ambulatory Visit (INDEPENDENT_AMBULATORY_CARE_PROVIDER_SITE_OTHER): Payer: Self-pay | Admitting: Family Medicine

## 2015-01-27 VITALS — BP 135/96 | HR 82 | Temp 98.3°F | Ht 66.0 in | Wt 312.1 lb

## 2015-01-27 DIAGNOSIS — N611 Abscess of the breast and nipple: Secondary | ICD-10-CM

## 2015-01-27 DIAGNOSIS — I10 Essential (primary) hypertension: Secondary | ICD-10-CM

## 2015-01-27 MED ORDER — DOXYCYCLINE HYCLATE 100 MG PO TABS: 100.0000 mg | ORAL_TABLET | Freq: Two times a day (BID) | ORAL | Status: DC

## 2015-01-27 NOTE — Progress Notes (Signed)
   Subjective:    Patient ID: Mary Hanson, female    DOB: [birth year], 39 y.o.   MRN: [id number]  Seen for Same day visit for   CC: Follow-up for angioedema due to ace and left breasts swelling  Angioedema  - Lip swelling resolving since discontinued lisinopril - No difficulties swallowing or breathing  Breast swelling - Reports red, swollen lump on left breast x 1-2 weeks - pain resolved after some drainage, and redness improving - denies fevers, chills, nipple discharge - No FHx of breast CA  Review of Systems   See HPI for ROS. Objective:  BP 135/96 mmHg  Pulse 82  Temp(Src) 98.3 F (36.8 C) (Oral)  Ht  (1.676 m)  Wt 312 lb 1 oz (141.551 kg)  BMI 50.39 kg/m2  LMP 01/06/2015  General: NAD HEENT: Mild erythema of lips and peelings without swelling L breast: ~ 1cm indurated area with mild overlying erythema, no current drainage; No nipple discharge    Assessment & Plan:   Abscess of breast Likely abscess of left breast s/p spontaneous drainage. Low suspicion for breast CA - doxy  BID x 7 days - warm compresses TID - f/u in 2 weeks in ensure resolution; if persist consider breast imaging  Hypertension Resolving swelling. BP controlled.  - Continue HCTZ 12.5 mg qd - Advised checking BP 1-2 x daily at work and emailing PCP BP log

## 2015-01-27 NOTE — Assessment & Plan Note (Signed)
Likely abscess of left breast s/p spontaneous drainage. Low suspicion for breast CA - doxy  BID x 7 days - warm compresses TID - f/u in 2 weeks in ensure resolution; if persist consider breast imaging

## 2015-01-27 NOTE — Assessment & Plan Note (Signed)
Resolving swelling. BP controlled.  - Continue HCTZ 12.5 mg qd - Advised checking BP 1-2 x daily at work and emailing PCP BP log

## 2015-01-27 NOTE — Patient Instructions (Signed)
It was great seeing you today.   1. Take doxycycline 100 mg twice a day for 7 days.  2. Apply warm compresses for 15-20 minutes 3-4 times a day until redness and swelling resolved.    Please bring all your medications to every doctors visit  Sign up for My Chart to have easy access to your labs results, and communication with your Primary care physician.  Next Appointment  Please make an appointment if hasn't resolved after antibiotics   If you have any questions or concerns before then, please call the clinic at [phone].  Take Care,   Dr Wenda Low

## 2015-02-16 ENCOUNTER — Encounter: Payer: Self-pay | Admitting: Family Medicine

## 2015-02-16 NOTE — Telephone Encounter (Signed)
Called pt, no answer, left message for pt to call the office in the morning.

## 2015-02-17 ENCOUNTER — Telehealth: Payer: Self-pay | Admitting: *Deleted

## 2015-02-17 NOTE — Telephone Encounter (Signed)
Spoke to pt, is having a whitish clear vaginal discharge with no odor and no vaginal itching or irritation.  Informed pt that is sounded like the discharge was normal and can sometimes increase due to fluctuations in the hormones.  Pt also reports amenorrhea for two months, recommended checking a home UPT to r/o pregnancy.  Pt acknowledged and will call back if she is pregnant to make an appt or if it is negative and she continues to experience amenorrhea.

## 2015-02-23 ENCOUNTER — Telehealth: Payer: Self-pay | Admitting: *Deleted

## 2015-02-23 MED ORDER — HYDROCHLOROTHIAZIDE 12.5 MG PO TABS: 12.5000 mg | ORAL_TABLET | Freq: Every day | ORAL | Status: DC

## 2015-02-23 NOTE — Telephone Encounter (Signed)
Refill completed.  Darely Becknell L, RN  

## 2015-02-25 ENCOUNTER — Encounter (HOSPITAL_COMMUNITY): Payer: Self-pay | Admitting: *Deleted

## 2015-02-25 ENCOUNTER — Inpatient Hospital Stay (HOSPITAL_COMMUNITY)
Admission: AD | Admit: 2015-02-25 | Discharge: 2015-02-26 | Disposition: A | Discharge status: Home / Self Care | Payer: Self-pay | Source: Ambulatory Visit | Attending: Obstetrics and Gynecology | Admitting: Obstetrics and Gynecology

## 2015-02-25 DIAGNOSIS — K219 Gastro-esophageal reflux disease without esophagitis: Secondary | ICD-10-CM | POA: Insufficient documentation

## 2015-02-25 DIAGNOSIS — R1013 Epigastric pain: Secondary | ICD-10-CM | POA: Insufficient documentation

## 2015-02-25 DIAGNOSIS — I10 Essential (primary) hypertension: Secondary | ICD-10-CM | POA: Insufficient documentation

## 2015-02-25 LAB — URINE MICROSCOPIC-ADD ON

## 2015-02-25 LAB — URINALYSIS, ROUTINE W REFLEX MICROSCOPIC
BILIRUBIN URINE: NEGATIVE
Glucose, UA: NEGATIVE mg/dL
Ketones, ur: NEGATIVE mg/dL
Leukocytes, UA: NEGATIVE
Nitrite: NEGATIVE
PROTEIN: NEGATIVE mg/dL
Specific Gravity, Urine: 1.02 (ref 1.005–1.030)
UROBILINOGEN UA: 1 mg/dL (ref 0.0–1.0)
pH: 6 (ref 5.0–8.0)

## 2015-02-25 LAB — POCT PREGNANCY, URINE: Preg Test, Ur: NEGATIVE

## 2015-02-25 NOTE — MAU Note (Signed)
Pt. States that her pain is in her upper abdomen.  Pt. Has been spotting since Sunday.

## 2015-02-25 NOTE — MAU Note (Signed)
Pt reports she has been having lower abd pain x 3-4 days, has been bleeding very minimally today. Negative preg test this week.

## 2015-02-26 DIAGNOSIS — R1013 Epigastric pain: Secondary | ICD-10-CM

## 2015-02-26 LAB — COMPREHENSIVE METABOLIC PANEL
ALT: 11 U/L — ABNORMAL LOW (ref 14–54)
AST: 13 U/L — AB (ref 15–41)
Albumin: 3.3 g/dL — ABNORMAL LOW (ref 3.5–5.0)
Alkaline Phosphatase: 77 U/L (ref 38–126)
Anion gap: 7 (ref 5–15)
BILIRUBIN TOTAL: 0.5 mg/dL (ref 0.3–1.2)
BUN: 12 mg/dL (ref 6–20)
CO2: 26 mmol/L (ref 22–32)
CREATININE: 0.8 mg/dL (ref 0.44–1.00)
Calcium: 8.5 mg/dL — ABNORMAL LOW (ref 8.9–10.3)
Chloride: 104 mmol/L (ref 101–111)
Glucose, Bld: 95 mg/dL (ref 65–99)
POTASSIUM: 3.6 mmol/L (ref 3.5–5.1)
Sodium: 137 mmol/L (ref 135–145)
TOTAL PROTEIN: 7.1 g/dL (ref 6.5–8.1)

## 2015-02-26 LAB — CBC
HEMATOCRIT: 34.5 % — AB (ref 36.0–46.0)
Hemoglobin: 10.8 g/dL — ABNORMAL LOW (ref 12.0–15.0)
MCH: 26.9 pg (ref 26.0–34.0)
MCHC: 31.3 g/dL (ref 30.0–36.0)
MCV: 86 fL (ref 78.0–100.0)
PLATELETS: 250 10*3/uL (ref 150–400)
RBC: 4.01 MIL/uL (ref 3.87–5.11)
RDW: 14.8 % (ref 11.5–15.5)
WBC: 9 10*3/uL (ref 4.0–10.5)

## 2015-02-26 LAB — AMYLASE: AMYLASE: 23 U/L — AB (ref 28–100)

## 2015-02-26 LAB — LIPASE, BLOOD: LIPASE: 23 U/L (ref 11–51)

## 2015-02-26 MED ORDER — GI COCKTAIL ~~LOC~~
30.0000 mL | Freq: Once | ORAL | Status: AC
  Administered 2015-02-26: 30 mL via ORAL
  Filled 2015-02-26: qty 30

## 2015-02-26 MED ORDER — HYDROCHLOROTHIAZIDE 25 MG PO TABS: 12.5000 mg | ORAL_TABLET | Freq: Every day | ORAL | Status: DC

## 2015-02-26 NOTE — MAU Provider Note (Signed)
History     CSN: [id number]  Arrival date and time: 02/25/15 2203   First Provider Initiated Contact with Patient 02/25/15 2350      No chief complaint on file.  HPI Comments: Mary Hanson is a 39 y.o. G1P0010 who presents today with upper abdominal pain. She states that her period started 02/21/15, and has been lighter than normal. She has also developed upper abdominal pain and pressure. Patient states that she is out of her blood pressure medication, and has not been able to get appointment with PCP.   Abdominal Pain This is a new problem. The current episode started today. The problem occurs constantly. The problem has been gradually worsening. The pain is located in the epigastric region. The pain is at a severity of 10/10. The quality of the pain is a sensation of fullness (pressure/bloating feeling ). The abdominal pain does not radiate. Associated symptoms include nausea. Pertinent negatives include no constipation (last BM today ), diarrhea, dysuria, fever, frequency or vomiting. Nothing aggravates the pain. The pain is relieved by nothing. Treatments tried: NSAIDs, oxycodone  The treatment provided mild (oxycodone helped some, but the ibuprofen did not. ) relief.    Past Medical History  Diagnosis Date  . Migraines   . Hypertension     under control with med., has been on med. x 1 yr.  . Allergy-induced asthma     prn inhaler  . Seasonal allergies   . History of anemia     no current problem, per pt.  . Pyogenic granuloma 12/2013    right ring finger    Past Surgical History  Procedure Laterality Date  . Cholecystectomy  2006 or 2007  . Cervical cone biopsy  age 54  . Dilation and evacuation  age 25  . Mass excision Right 01/16/2014    Procedure: EXCISION PYOGENIC GRANULOMA FROM RIGHT RING FINGER;  Surgeon: Dairl Ponder, MD;  Location: [facility];  Service: Orthopedics;  Laterality: Right;  . Dilation and curettage of uterus      Family History   Problem Relation Age of Onset  . Diabetes type II Father   . Hypertension Father   . Hypertension Mother   . Goiter Sister 53    identical twin  . Asthma Sister   . Hypertension Sister   . Stroke Maternal Grandfather   . Diabetes Paternal Grandfather   . Heart disease Paternal Grandfather     Social History  Substance Use Topics  . Smoking status: Never Smoker   . Smokeless tobacco: Never Used  . Alcohol Use: No     Comment: Wine once a year     Allergies:  Allergies  Allergen Reactions  . Ivp Dye [Iodinated Diagnostic Agents] Nausea And Vomiting  . Augmentin [Amoxicillin-Pot Clavulanate] Nausea And Vomiting  . Lisinopril Swelling    Lip swelling/Angioedema  . Zofran [Ondansetron Hcl] Other (See Comments)    Severe headache    Prescriptions prior to admission  Medication Sig Dispense Refill Last Dose  . albuterol (PROVENTIL HFA;VENTOLIN HFA) 108 (90 BASE) MCG/ACT inhaler Inhale 2 puffs into the lungs every 4 (four) hours as needed for shortness of breath (asthma). 18 g 2   . cetirizine (ZYRTEC) 10 MG tablet Take 10 mg by mouth.   Past Week at Unknown time  . Diclofenac Potassium 50 MG PACK Take 50 mg by mouth once as needed. Take once daily as needed with headache onset. Please take with food 10 each 11 More than a  month at Unknown time  . doxycycline (VIBRA-TABS) 100 MG tablet Take 1 tablet (100 mg total) by mouth 2 (two) times daily. 14 tablet 0   . eletriptan (RELPAX) 40 MG tablet Take 1 tablet (40 mg total) by mouth as needed for migraine or headache. May repeat in 2 hours if headache persists or recurs. 10 tablet 0 More than a month at Unknown time  . hydrochlorothiazide (HYDRODIURIL) 12.5 MG tablet Take 1 tablet (12.5 mg total) by mouth daily. 30 tablet 0   . ibuprofen (ADVIL,MOTRIN) 200 MG tablet Take 800 mg by mouth every 6 (six) hours as needed for headache.   Past Week at Unknown time  . prochlorperazine (COMPAZINE) 5 MG tablet Take 1-2 tablets (5-10 mg total) by  mouth every 8 (eight) hours as needed for nausea or vomiting. 30 tablet 0 Past Month at Unknown time  . SUMAtriptan (IMITREX) 100 MG tablet Take 1 tablet (100 mg total) by mouth once. May repeat in 2 hours if headache persists or recurs. 10 tablet 11   . SUMAtriptan-naproxen (TREXIMET) 85-500 MG per tablet Take 1 tablet by mouth every 2 (two) hours as needed for migraine. 10 tablet 0     Review of Systems  Constitutional: Negative for fever.  Gastrointestinal: Positive for nausea and abdominal pain. Negative for vomiting, diarrhea and constipation (last BM today ).  Genitourinary: Negative for dysuria, urgency and frequency.   Physical Exam   Blood pressure 146/86, pulse 78, temperature 98.6 F (37 C), temperature source Oral, resp. rate 20, last menstrual period 02/21/2015, SpO2 98 %.  Physical Exam  Nursing note and vitals reviewed. Constitutional: She is oriented to person, place, and time. She appears well-developed and well-nourished. No distress.  Cardiovascular: Normal rate.   Respiratory: Effort normal.  GI: Soft. There is tenderness. Rebound: in RUQ   Neurological: She is alert and oriented to person, place, and time.  Skin: Skin is warm and dry.  Psychiatric: She has a normal mood and affect.   Results for orders placed or performed during the hospital encounter of 02/25/15 (from the past 24 hour(s))  Urinalysis, Routine w reflex microscopic (not at [facility])     Status: Abnormal   Collection Time: 02/25/15 10:42 PM  Result Value Ref Range   Color, Urine YELLOW YELLOW   APPearance CLEAR CLEAR   Specific Gravity, Urine 1.020 1.005 - 1.030   pH 6.0 5.0 - 8.0   Glucose, UA NEGATIVE NEGATIVE mg/dL   Hgb urine dipstick SMALL (A) NEGATIVE   Bilirubin Urine NEGATIVE NEGATIVE   Ketones, ur NEGATIVE NEGATIVE mg/dL   Protein, ur NEGATIVE NEGATIVE mg/dL   Urobilinogen, UA 1.0 0.0 - 1.0 mg/dL   Nitrite NEGATIVE NEGATIVE   Leukocytes, UA NEGATIVE NEGATIVE  Urine microscopic-add on      Status: None   Collection Time: 02/25/15 10:42 PM  Result Value Ref Range   Squamous Epithelial / LPF RARE RARE   WBC, UA 0-2 <3 WBC/hpf   RBC / HPF 0-2 <3 RBC/hpf   Bacteria, UA RARE RARE  Pregnancy, urine POC     Status: None   Collection Time: 02/25/15 10:45 PM  Result Value Ref Range   Preg Test, Ur NEGATIVE NEGATIVE  CBC     Status: Abnormal   Collection Time: 02/26/15 12:19 AM  Result Value Ref Range   WBC 9.0 4.0 - 10.5 K/uL   RBC 4.01 3.87 - 5.11 MIL/uL   Hemoglobin 10.8 (L) 12.0 - 15.0 g/dL   HCT 40.9 (  L) 36.0 - 46.0 %   MCV 86.0 78.0 - 100.0 fL   MCH 26.9 26.0 - 34.0 pg   MCHC 31.3 30.0 - 36.0 g/dL   RDW 16.114.8 09.611.5 - 04.515.5 %   Platelets 250 150 - 400 K/uL  Comprehensive metabolic panel     Status: Abnormal   Collection Time: 02/26/15 12:19 AM  Result Value Ref Range   Sodium 137 135 - 145 mmol/L   Potassium 3.6 3.5 - 5.1 mmol/L   Chloride 104 101 - 111 mmol/L   CO2 26 22 - 32 mmol/L   Glucose, Bld 95 65 - 99 mg/dL   BUN 12 6 - 20 mg/dL   Creatinine, Ser 4.090.80 0.44 - 1.00 mg/dL   Calcium 8.5 (L) 8.9 - 10.3 mg/dL   Total Protein 7.1 6.5 - 8.1 g/dL   Albumin 3.3 (L) 3.5 - 5.0 g/dL   AST 13 (L) 15 - 41 U/L   ALT 11 (L) 14 - 54 U/L   Alkaline Phosphatase 77 38 - 126 U/L   Total Bilirubin 0.5 0.3 - 1.2 mg/dL   GFR calc non Af Amer >60 >60 mL/min   GFR calc Af Amer >60 >60 mL/min   Anion gap 7 5 - 15  Amylase     Status: Abnormal   Collection Time: 02/26/15 12:19 AM  Result Value Ref Range   Amylase 23 (L) 28 - 100 U/L  Lipase, blood     Status: None   Collection Time: 02/26/15 12:19 AM  Result Value Ref Range   Lipase 23 11 - 51 U/L     MAU Course  Procedures  MDM Patient has had GI cocktail. Reports that her pain has improved significantly.  Assessment and Plan   1. Abdominal pain, epigastric   2. Gastroesophageal reflux disease, esophagitis presence not specified    DC home Comfort measures reviewed  Will refill b/p meds for one month  RX: HCTZ  12.5 mg QD #30 Return to MAU as needed FU with OB as planned  Follow-up Information    Schedule an appointment as soon as possible for a visit with Reva BoresPRATT,TANYA S, MD.   Specialty:  Obstetrics and Gynecology   Contact information:   [address] [place] [id number] [phone]         Tawnya CrookHogan, Heather Donovan 02/26/2015, 12:04 AM

## 2015-02-26 NOTE — Discharge Instructions (Signed)
Food Choices for Gastroesophageal Reflux Disease, Adult When you have gastroesophageal reflux disease (GERD), the foods you eat and your eating habits are very important. Choosing the right foods can help ease the discomfort of GERD. WHAT GENERAL GUIDELINES DO I NEED TO FOLLOW?  Choose fruits, vegetables, whole grains, low-fat dairy products, and low-fat meat, fish, and poultry.  Limit fats such as oils, salad dressings, butter, nuts, and avocado.  Keep a food diary to identify foods that cause symptoms.  Avoid foods that cause reflux. These may be different for different people.  Eat frequent small meals instead of three large meals each day.  Eat your meals slowly, in a relaxed setting.  Limit fried foods.  Cook foods using methods other than frying.  Avoid drinking alcohol.  Avoid drinking large amounts of liquids with your meals.  Avoid bending over or lying down until 2-3 hours after eating. WHAT FOODS ARE NOT RECOMMENDED? The following are some foods and drinks that may worsen your symptoms: Vegetables Tomatoes. Tomato juice. Tomato and spaghetti sauce. Chili peppers. Onion and garlic. Horseradish. Fruits Oranges, grapefruit, and lemon (fruit and juice). Meats High-fat meats, fish, and poultry. This includes hot dogs, ribs, ham, sausage, salami, and bacon. Dairy Whole milk and chocolate milk. Sour cream. Cream. Butter. Ice cream. Cream cheese.  Beverages Coffee and tea, with or without caffeine. Carbonated beverages or energy drinks. Condiments Hot sauce. Barbecue sauce.  Sweets/Desserts Chocolate and cocoa. Donuts. Peppermint and spearmint. Fats and Oils High-fat foods, including French fries and potato chips. Other Vinegar. Strong spices, such as black pepper, white pepper, red pepper, cayenne, curry powder, cloves, ginger, and chili powder. The items listed above may not be a complete list of foods and beverages to avoid. Contact your dietitian for more  information.   This information is not intended to replace advice given to you by your health care provider. Make sure you discuss any questions you have with your health care provider.   Document Released: 04/10/2005 Document Revised: 05/01/2014 Document Reviewed: 02/12/2013 Elsevier Interactive Patient Education 2016 [employer] Inc.  

## 2015-03-01 ENCOUNTER — Encounter: Payer: Self-pay | Admitting: Family Medicine

## 2015-03-01 ENCOUNTER — Ambulatory Visit (INDEPENDENT_AMBULATORY_CARE_PROVIDER_SITE_OTHER): Payer: Self-pay | Admitting: Family Medicine

## 2015-03-01 VITALS — BP 144/88 | HR 64 | Temp 98.3°F | Ht 66.0 in | Wt 314.4 lb

## 2015-03-01 DIAGNOSIS — J45901 Unspecified asthma with (acute) exacerbation: Secondary | ICD-10-CM

## 2015-03-01 MED ORDER — PREDNISONE 50 MG PO TABS: 50.0000 mg | ORAL_TABLET | Freq: Every day | ORAL | Status: DC

## 2015-03-01 MED ORDER — ALBUTEROL SULFATE HFA 108 (90 BASE) MCG/ACT IN AERS: 2.0000 | INHALATION_SPRAY | RESPIRATORY_TRACT | Status: DC | PRN

## 2015-03-01 NOTE — Progress Notes (Signed)
    Subjective:  Mary Hanson is a 39 y.o. female who [facility] with a chief complaint of shortness of breath.   HPI:  Shortness of Breath Patient reports that she has noticed increased shortness of breath for the past 2 weeks, though has significantly worsened over the past 2-3 days. Symptoms have been gradually worse Patient has a history of asthma and has an albuterol inhaler which she rarely uses, though states that she has been using 2 puffs every 4 hours for the last 2 days. Albuterol has been helping her shortness of breath. Patient thinks that her current exacerbation may be related to teh recent weather changes, though does report that she used a new type of cleaner last week that may have worsened her symptoms. Had a similar flare of asthma around this time last year that was treated with prednisone. No fevers or chills. No rhinorrhea. Some cough and wheezing. 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 No LE edema, orthopnea, or PND.   ROS: Per HPI  PMH:  The following were reviewed and entered/updated in epic: Past Medical History  Diagnosis Date  . Migraines   . Hypertension     under control with med., has been on med. x 1 yr.  . Allergy-induced asthma     prn inhaler  . Seasonal allergies   . History of anemia     no current problem, per pt.  . Pyogenic granuloma 12/2013    right ring finger   Patient Active Problem List   Diagnosis Date Noted  . Asthma with acute exacerbation 03/01/2015  . Peripheral neuralgia 11/11/2014  . Acute sinusitis 11/02/2014  . Migraine with status migrainosus 08/04/2014  . Enlarged thyroid 04/03/2013  . Unspecified vitamin D deficiency 07/29/2012  . External hemorrhoid 09/06/2011  . Hypertension 12/29/2010  . Morbid obesity (HCC) 12/29/2010  . Asthma 12/29/2010  . Allergic rhinitis 12/29/2010  . Migraine headache 12/29/2010   Past Surgical History  Procedure Laterality Date  .  Cholecystectomy  2006 or 2007  . Cervical cone biopsy  age 89  . Dilation and evacuation  age 39  . Mass excision Right 01/16/2014    Procedure: EXCISION PYOGENIC GRANULOMA FROM RIGHT RING FINGER;  Surgeon: Dairl PonderMatthew Weingold, MD;  Location: [facility];  Service: Orthopedics;  Laterality: Right;  . Dilation and curettage of uterus       Objective:  Physical Exam: BP 144/88 mmHg  Pulse 64  Temp(Src) 98.3 F (36.8 C) (Oral)  Ht 5\' 6"  (1.676 m)  Wt 314 lb 6 oz (142.6 kg)  BMI 50.77 kg/m2  SpO2 100%  LMP 02/21/2015  Gen: NAD, resting comfortably, speaking in full sentences CV: RRR with no murmurs appreciated Lungs: Minimally increased work of breathing. Mild end expiratory wheezes noted throughout all lung fields. Good air movement.  GI: Normal bowel sounds present. Soft, Nontender, Nondistended. MSK: no edema, cyanosis, or clubbing noted Skin: warm, dry Neuro: grossly normal, moves all extremities Psych: Normal affect and thought content  Assessment/Plan:  Asthma with acute exacerbation Presentation most consistent with mild asthma flare, likely exacerbated by recent weather changes. No signs or symptoms of volume overload. Wells score 0. Will refill patient's albuterol inhaler and give course of prednisone. Return precautions reviewed.     Katina Degreealeb M. Jimmey RalphParker, MD [facility] Resident PGY-2 03/01/2015 11:47 AM

## 2015-03-01 NOTE — Patient Instructions (Signed)

## 2015-03-01 NOTE — Assessment & Plan Note (Signed)
Presentation most consistent with mild asthma flare, likely exacerbated by recent weather changes. No signs or symptoms of volume overload. Wells score 0. Will refill patient's albuterol inhaler and give course of prednisone. Return precautions reviewed.

## 2015-03-07 ENCOUNTER — Encounter (HOSPITAL_COMMUNITY): Payer: Self-pay | Admitting: *Deleted

## 2015-03-07 ENCOUNTER — Inpatient Hospital Stay (HOSPITAL_COMMUNITY)
Admission: AD | Admit: 2015-03-07 | Discharge: 2015-03-07 | Disposition: A | Discharge status: Home / Self Care | Payer: Self-pay | Source: Ambulatory Visit | Attending: Family Medicine | Admitting: Family Medicine

## 2015-03-07 DIAGNOSIS — N939 Abnormal uterine and vaginal bleeding, unspecified: Secondary | ICD-10-CM | POA: Insufficient documentation

## 2015-03-07 DIAGNOSIS — Z3202 Encounter for pregnancy test, result negative: Secondary | ICD-10-CM | POA: Insufficient documentation

## 2015-03-07 DIAGNOSIS — N920 Excessive and frequent menstruation with regular cycle: Secondary | ICD-10-CM | POA: Insufficient documentation

## 2015-03-07 DIAGNOSIS — Z880 Allergy status to penicillin: Secondary | ICD-10-CM | POA: Insufficient documentation

## 2015-03-07 LAB — POCT PREGNANCY, URINE: PREG TEST UR: NEGATIVE

## 2015-03-07 LAB — CBC
HCT: 38.8 % (ref 36.0–46.0)
Hemoglobin: 12.1 g/dL (ref 12.0–15.0)
MCH: 26.8 pg (ref 26.0–34.0)
MCHC: 31.2 g/dL (ref 30.0–36.0)
MCV: 85.8 fL (ref 78.0–100.0)
PLATELETS: 302 10*3/uL (ref 150–400)
RBC: 4.52 MIL/uL (ref 3.87–5.11)
RDW: 14.8 % (ref 11.5–15.5)
WBC: 9.6 10*3/uL (ref 4.0–10.5)

## 2015-03-07 MED ORDER — MEGESTROL ACETATE 20 MG PO TABS: ORAL_TABLET | ORAL | Status: DC

## 2015-03-07 NOTE — MAU Note (Signed)
Has had heavy vag bleeding for past wk. Started out with spotting but today soaking pads every 2 hours with clots.

## 2015-03-07 NOTE — MAU Note (Signed)
PT  SAYS VAG BLEEDING  BECAME   WORSE -  STARTED  THIS  AM .    WAS AT [facility] -   [place]-  .        2 WEEKS  AGO  SHE  WAS   HERE  FOR UPPER ABD  PAIN-   RELIEVED  WITH  GI  COCKTAIL.  IN  ROOM  3 -   SMALL AMT  LIGHT   RED

## 2015-03-07 NOTE — Discharge Instructions (Signed)

## 2015-03-07 NOTE — MAU Provider Note (Signed)
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  G1P0010  presenting for Vaginal Bleeding and Abdominal Cramping  On October 30th began spotting, then like a normal period on 11/4, and last 2 days going through a pad every few hours. Menstruation usually lasts one week. No pain currently. No aspirin or anticoagulants.  Normal emb 2014 (obtained 2/2 menorrhagia) and normal tvus 2015.   Past Medical History  Diagnosis Date  . Migraines   . Hypertension     under control with med., has been on med. x 1 yr.  . Allergy-induced asthma     prn inhaler  . Seasonal allergies   . History of anemia     no current problem, per pt.  . Pyogenic granuloma 12/2013    right ring finger    Past Surgical History  Procedure Laterality Date  . Cholecystectomy  2006 or 2007  . Cervical cone biopsy  age 89  . Dilation and evacuation  age 39  . Mass excision Right 01/16/2014    Procedure: EXCISION PYOGENIC GRANULOMA FROM RIGHT RING FINGER;  Surgeon: Dairl PonderMatthew Weingold, MD;  Location: [facility];  Service: Orthopedics;  Laterality: Right;  . Dilation and curettage of uterus      Family History  Problem Relation Age of Onset  . Diabetes type II Father   . Hypertension Father   . Hypertension Mother   . Goiter Sister [id number]    identical twin  . Asthma Sister   . Hypertension Sister   . Stroke Maternal Grandfather   . Diabetes Paternal Grandfather   . Heart disease Paternal Grandfather     Social History  Substance Use Topics  . Smoking status: Never Smoker   . Smokeless tobacco: Never Used  . Alcohol Use: No     Comment: Wine once a year     Allergies  Allergen Reactions  . Ivp Dye [Iodinated Diagnostic Agents] Nausea And Vomiting  . Augmentin [Amoxicillin-Pot Clavulanate] Nausea And Vomiting  . Lisinopril Swelling    Lip swelling/Angioedema  . Zofran [Ondansetron Hcl] Other (See Comments)    Severe headache     Prescriptions prior to admission  Medication Sig Dispense Refill Last Dose  . albuterol (PROVENTIL HFA;VENTOLIN HFA) 108 (90 BASE) MCG/ACT inhaler Inhale 2 puffs into the lungs every 4 (four) hours as needed for wheezing or shortness of breath. 1 Inhaler 0 Past Week at Unknown time  . hydrochlorothiazide (HYDRODIURIL) 12.5 MG tablet Take 1 tablet (12.5 mg total) by mouth daily. 30 tablet 0 03/06/2015 at Unknown time  . ibuprofen (ADVIL,MOTRIN) 200 MG tablet Take 800 mg by mouth every 6 (six) hours as needed for headache.   03/06/2015 at Unknown time  . predniSONE (DELTASONE) 50 MG tablet Take 1 tablet (50 mg total) by mouth daily with breakfast. 5 tablet 0 03/06/2015 at Unknown time  . SUMAtriptan (IMITREX) 100 MG tablet Take 1 tablet (100 mg total) by mouth once. May repeat in 2 hours if headache persists or recurs. 10 tablet 11 Past Week at Unknown time  . cetirizine (ZYRTEC) 10 MG tablet Take 10 mg by mouth.   More than a month at Unknown time  . Diclofenac Potassium 50 MG PACK Take 50 mg by mouth once as needed. Take once daily as needed with headache onset. Please take with food (Patient not taking: Reported on 03/07/2015) 10 each 11 More than a month at Unknown time  .  doxycycline (VIBRA-TABS) 100 MG tablet Take 1 tablet (100 mg total) by mouth 2 (two) times daily. (Patient not taking: Reported on 03/07/2015) 14 tablet 0   . eletriptan (RELPAX) 40 MG tablet Take 1 tablet (40 mg total) by mouth as needed for migraine or headache. May repeat in 2 hours if headache persists or recurs. 10 tablet 0 More than a month at Unknown time  . hydrochlorothiazide (HYDRODIURIL) 25 MG tablet Take 0.5 tablets (12.5 mg total) by mouth daily. 30 tablet 0   . prochlorperazine (COMPAZINE) 5 MG tablet Take 1-2 tablets (5-10 mg total) by mouth every 8 (eight) hours as needed for nausea or vomiting. 30 tablet 0 Past Month at Unknown time  . SUMAtriptan-naproxen (TREXIMET) 85-500 MG per tablet Take 1 tablet by mouth  every 2 (two) hours as needed for migraine. 10 tablet 0 More than a month at Unknown time    Review of Systems - Negative except for what is mentioned in HPI.  Physical Exam  Blood pressure 127/85, pulse 69, temperature 97.6 F (36.4 C), resp. rate 20, height  (1.676 m), weight 316 lb (143.337 kg), last menstrual period 02/21/2015. GENERAL: Well-developed, well-nourished female in no acute distress.  LUNGS: Clear to auscultation bilaterally.  HEART: Regular rate and rhythm. ABDOMEN: Soft, nontender, nondistended, gravid.  EXTREMITIES: Nontender, no edema, 2+ distal pulses. GU: small amount blood in vagina and extending from cervical os. No lacerations   Labs: Results for orders placed or performed during the hospital encounter of 03/07/15 (from the past 24 hour(s))  CBC   Collection Time: 03/07/15  1:20 AM  Result Value Ref Range   WBC 9.6 4.0 - 10.5 K/uL   RBC 4.52 3.87 - 5.11 MIL/uL   Hemoglobin 12.1 12.0 - 15.0 g/dL   HCT 40.9 81.1 - 91.4 %   MCV 85.8 78.0 - 100.0 fL   MCH 26.8 26.0 - 34.0 pg   MCHC 31.2 30.0 - 36.0 g/dL   RDW 78.2 95.6 - 21.3 %   Platelets 302 150 - 400 K/uL   Urine pregnancy test: negative  Imaging Studies:  No results found.  Assessment: Mary Hanson is  39 y.o. G1P0010 who presents with abnormal uterine bleeding - menorrhagia. Hemoglobin today is wnl and actually higher than it was 2 wks ago when she presented here with spotting and abdominal pain. No pain so I do not think this ovarian torsion, ruptured cyst. Exam does show blood from os do do think this uterine bleeding. Had normal emb 2 years ago, and normal u/s 1 year ago.   Plan: - megace bid  - clinic f/u  Cherrie Gauze [facility] 11/13/20163:22 AM

## 2015-04-14 ENCOUNTER — Telehealth: Payer: Self-pay | Admitting: General Practice

## 2015-04-14 NOTE — Telephone Encounter (Signed)
Patient called into front office stating she was seen in the ER and given megace to stop her bleeding. Patient states it worked and she stopped the medication. Patient states she started bleeding the first week of December and hasn't stopped bleeding since. Patient states she is also having bad cramps today. Recommended to patient motrin 200mg  4 tablets every 8 hours as needed. Recommended she start megace again twice a day and keep taking until her appt in the clinic. Told patient if that doesn't help with the bleeding and she can take it three times a day (per Dr Shawnie PonsPratt). Patient verbalized understanding to all. Told patient that I would place her on hold so someone can schedule her a f/u appt in the office. Patient verbalized understanding & had no other questions

## 2015-04-16 ENCOUNTER — Ambulatory Visit: Payer: Self-pay | Admitting: Family Medicine

## 2015-04-30 ENCOUNTER — Encounter (HOSPITAL_BASED_OUTPATIENT_CLINIC_OR_DEPARTMENT_OTHER): Payer: Self-pay | Admitting: *Deleted

## 2015-04-30 ENCOUNTER — Emergency Department (HOSPITAL_BASED_OUTPATIENT_CLINIC_OR_DEPARTMENT_OTHER)
Admission: EM | Admit: 2015-04-30 | Discharge: 2015-04-30 | Disposition: A | Discharge status: Home / Self Care | Payer: Self-pay | Attending: Emergency Medicine | Admitting: Emergency Medicine

## 2015-04-30 ENCOUNTER — Emergency Department (HOSPITAL_BASED_OUTPATIENT_CLINIC_OR_DEPARTMENT_OTHER): Payer: 59

## 2015-04-30 DIAGNOSIS — Z872 Personal history of diseases of the skin and subcutaneous tissue: Secondary | ICD-10-CM | POA: Insufficient documentation

## 2015-04-30 DIAGNOSIS — J45901 Unspecified asthma with (acute) exacerbation: Secondary | ICD-10-CM | POA: Insufficient documentation

## 2015-04-30 DIAGNOSIS — I10 Essential (primary) hypertension: Secondary | ICD-10-CM | POA: Insufficient documentation

## 2015-04-30 DIAGNOSIS — Z862 Personal history of diseases of the blood and blood-forming organs and certain disorders involving the immune mechanism: Secondary | ICD-10-CM | POA: Insufficient documentation

## 2015-04-30 DIAGNOSIS — Z79899 Other long term (current) drug therapy: Secondary | ICD-10-CM | POA: Insufficient documentation

## 2015-04-30 DIAGNOSIS — J40 Bronchitis, not specified as acute or chronic: Secondary | ICD-10-CM

## 2015-04-30 DIAGNOSIS — H748X3 Other specified disorders of middle ear and mastoid, bilateral: Secondary | ICD-10-CM | POA: Insufficient documentation

## 2015-04-30 DIAGNOSIS — G43909 Migraine, unspecified, not intractable, without status migrainosus: Secondary | ICD-10-CM | POA: Insufficient documentation

## 2015-04-30 MED ORDER — PREDNISONE 20 MG PO TABS: 40.0000 mg | ORAL_TABLET | Freq: Every day | ORAL | Status: DC

## 2015-04-30 MED ORDER — ALBUTEROL SULFATE (2.5 MG/3ML) 0.083% IN NEBU
5.0000 mg | INHALATION_SOLUTION | Freq: Once | RESPIRATORY_TRACT | Status: AC
  Administered 2015-04-30: 5 mg via RESPIRATORY_TRACT
  Filled 2015-04-30: qty 6

## 2015-04-30 MED ORDER — IPRATROPIUM BROMIDE 0.02 % IN SOLN
0.5000 mg | Freq: Once | RESPIRATORY_TRACT | Status: AC
  Administered 2015-04-30: 0.5 mg via RESPIRATORY_TRACT
  Filled 2015-04-30: qty 2.5

## 2015-04-30 MED ORDER — PREDNISONE 50 MG PO TABS
60.0000 mg | ORAL_TABLET | Freq: Once | ORAL | Status: AC
  Administered 2015-04-30: 60 mg via ORAL
  Filled 2015-04-30: qty 1

## 2015-04-30 MED FILL — predniSONE 20 MG TABS: 20 | 5 days supply | Qty: 10 | Fill #0

## 2015-04-30 NOTE — Discharge Instructions (Signed)

## 2015-04-30 NOTE — ED Provider Notes (Addendum)
CSN: [id number]     Arrival date & time 04/30/15  1504 History   First MD Initiated Contact with Patient 04/30/15 1525     Chief Complaint  Patient presents with  . Asthma     (Consider location/radiation/quality/duration/timing/severity/associated sxs/prior Treatment) Patient is a 40 y.o. female presenting with wheezing. The history is provided by the patient.  Wheezing Severity:  Severe Severity compared to prior episodes:  Similar Onset quality:  Gradual Duration:  3 days Timing:  Intermittent Progression:  Worsening Chronicity:  Recurrent Context comment:  Recent URI and sinus sx over the last few weeks Relieved by:  Nothing Exacerbated by: lying down at night. Ineffective treatments:  Beta-agonist inhaler Associated symptoms: cough, rhinorrhea and shortness of breath   Associated symptoms: no chest pain, no chest tightness, no ear pain, no fever, no sore throat and no sputum production   Risk factors: no prior hospitalizations, no prior ICU admissions and no prior intubations     Past Medical History  Diagnosis Date  . Migraines   . Hypertension     under control with med., has been on med. x 1 yr.  . Allergy-induced asthma     prn inhaler  . Seasonal allergies   . History of anemia     no current problem, per pt.  . Pyogenic granuloma 12/2013    right ring finger   Past Surgical History  Procedure Laterality Date  . Cholecystectomy  2006 or 2007  . Cervical cone biopsy  age 40  . Dilation and evacuation  age 40  . Mass excision Right 01/16/2014    Procedure: EXCISION PYOGENIC GRANULOMA FROM RIGHT RING FINGER;  Surgeon: Dairl PonderMatthew Weingold, MD;  [facility];  Service: Orthopedics;  Laterality: Right;  . Dilation and curettage of uterus     Family History  Problem Relation Age of Onset  . Diabetes type II Father   . Hypertension Father   . Hypertension Mother   . Goiter Sister [id number]    identical twin  . Asthma Sister   . Hypertension Sister    . Stroke Maternal Grandfather   . Diabetes Paternal Grandfather   . Heart disease Paternal Grandfather    Social History  Substance Use Topics  . Smoking status: Never Smoker   . Smokeless tobacco: Never Used  . Alcohol Use: No     Comment: Wine once a year    OB History    Gravida Para Term Preterm AB TAB SAB Ectopic Multiple Living   1    1 1     0     Review of Systems  Constitutional: Negative for fever.  HENT: Positive for rhinorrhea. Negative for ear pain and sore throat.   Respiratory: Positive for cough, shortness of breath and wheezing. Negative for sputum production and chest tightness.   Cardiovascular: Negative for chest pain.  All other systems reviewed and are negative.     Allergies  Ivp dye; Augmentin; Lisinopril; and Zofran  Home Medications   Prior to Admission medications   Medication Sig Start Date End Date Taking? Authorizing Provider  albuterol (PROVENTIL HFA;VENTOLIN HFA) 108 (90 BASE) MCG/ACT inhaler Inhale 2 puffs into the lungs every 4 (four) hours as needed for wheezing or shortness of breath. 03/01/15   Ardith Darkaleb M Parker, MD  cetirizine (ZYRTEC) 10 MG tablet Take 10 mg by mouth.    Historical Provider, MD  Diclofenac Potassium 50 MG PACK Take 50 mg by mouth once as needed. Take once daily  as needed with headache onset. Please take with food Patient not taking: Reported on 03/07/2015 11/15/14   Anson Fret, MD  doxycycline (VIBRA-TABS) 100 MG tablet Take 1 tablet (100 mg total) by mouth 2 (two) times daily. Patient not taking: Reported on 03/07/2015 01/27/15   Jamal Collin, MD  eletriptan (RELPAX) 40 MG tablet Take 1 tablet (40 mg total) by mouth as needed for migraine or headache. May repeat in 2 hours if headache persists or recurs. 11/15/14   Anson Fret, MD  hydrochlorothiazide (HYDRODIURIL) 12.5 MG tablet Take 1 tablet (12.5 mg total) by mouth daily. 02/23/15   Reva Bores, MD  hydrochlorothiazide (HYDRODIURIL) 25 MG tablet Take 0.5  tablets (12.5 mg total) by mouth daily. 02/26/15   Armando Reichert, CNM  ibuprofen (ADVIL,MOTRIN) 200 MG tablet Take 800 mg by mouth every 6 (six) hours as needed for headache.    Historical Provider, MD  megestrol (MEGACE) 20 MG tablet Take 1 tablet twice a day for bleeding. If needed, can take 2 tablets twice a day 03/07/15   Kathrynn Running, MD  prochlorperazine (COMPAZINE) 5 MG tablet Take 1-2 tablets (5-10 mg total) by mouth every 8 (eight) hours as needed for nausea or vomiting. 11/02/14   Smitty Cords, DO  SUMAtriptan (IMITREX) 100 MG tablet Take 1 tablet (100 mg total) by mouth once. May repeat in 2 hours if headache persists or recurs. 11/11/14   Anson Fret, MD  SUMAtriptan-naproxen (TREXIMET) 85-500 MG per tablet Take 1 tablet by mouth every 2 (two) hours as needed for migraine. 11/15/14   Anson Fret, MD   BP 164/100 mmHg  Pulse 88  Temp(Src) 98.5 F (36.9 C) (Oral)  Resp 18  Ht 5\' 6"  (1.676 m)  Wt 300 lb (136.079 kg)  BMI 48.44 kg/m2  SpO2 100%  LMP 04/15/2015 Physical Exam  Constitutional: She is oriented to person, place, and time. She appears well-developed and well-nourished. No distress.  HENT:  Head: Normocephalic and atraumatic.  Right Ear: External ear normal. A middle ear effusion is present.  Left Ear: External ear normal. A middle ear effusion is present.  Nose: Mucosal edema and rhinorrhea present.  Mouth/Throat: Oropharynx is clear and moist.  Eyes: Conjunctivae and EOM are normal. Pupils are equal, round, and reactive to light. Right eye exhibits no discharge.  Neck: Normal range of motion. Neck supple.  Cardiovascular: Normal rate, regular rhythm, normal heart sounds and intact distal pulses.   No murmur heard. Pulmonary/Chest: Tachypnea noted. No respiratory distress. She has no decreased breath sounds. She has wheezes. She has no rhonchi. She has no rales.  Abdominal: Soft. There is no tenderness.  Musculoskeletal: Normal range of motion.  She exhibits no edema or tenderness.  Neurological: She is alert and oriented to person, place, and time.  Skin: Skin is warm and dry. No rash noted.  Psychiatric: She has a normal mood and affect.    ED Course  Procedures (including critical care time) Labs Review Labs Reviewed - No data to display  Imaging Review Dg Chest 2 View  04/30/2015  CLINICAL DATA:  Onset of shortness of breath and wheezing today; known asthma, symptoms unresponsive to home inhaler. EXAM: CHEST  2 VIEW COMPARISON:  PA and lateral chest x-ray of April 19, 2014 FINDINGS: The lungs are adequately inflated. The interstitial markings are slightly more conspicuous overall today. The heart and pulmonary vascularity are normal. The mediastinum is normal in width. There is no pleural  effusion or pneumothorax. The bony thorax exhibits no acute abnormality. IMPRESSION: Increased interstitial markings over baseline consistent with acute bronchitis superimposed upon the patient's chronic asthma. There is no focal pneumonia. Follow-up radiographs are recommended if the patient's symptoms do not respond to anticipated therapy. Electronically Signed   By: David  [place] M.D.   On: 04/30/2015 15:56   I have personally reviewed and evaluated these images and lab results as part of my medical decision-making.   EKG Interpretation None      MDM   Final diagnoses:  Asthma exacerbation  Bronchitis    Pt with typical asthma exacerbation sx with recent sinus sx.  No productive cough or other complaints.  Wheezing on exam.  will give steroids, albuterol/atrovent and recheck.  CXR pending.  No resp distress here and speaking in complete sentences.  Pt improved after first treatment and will need a second.  4:55 PM Chest x-ray showing bronchitis but no focal pneumonia. After second treatment patient's wheezing has resolved. She was discharged home with prednisone.  Gwyneth Sprout, MD 04/30/15 1656  Gwyneth Sprout,  MD 04/30/15 [id number]

## 2015-04-30 NOTE — ED Notes (Addendum)
Pt c/o "asthma attack" x 1 hr w/o relief from albuterol inhaler

## 2015-05-04 ENCOUNTER — Encounter (HOSPITAL_BASED_OUTPATIENT_CLINIC_OR_DEPARTMENT_OTHER): Payer: Self-pay | Admitting: *Deleted

## 2015-05-04 ENCOUNTER — Emergency Department (HOSPITAL_BASED_OUTPATIENT_CLINIC_OR_DEPARTMENT_OTHER)
Admission: EM | Admit: 2015-05-04 | Discharge: 2015-05-04 | Disposition: A | Discharge status: Home / Self Care | Payer: 59 | Attending: Emergency Medicine | Admitting: Emergency Medicine

## 2015-05-04 DIAGNOSIS — Z79899 Other long term (current) drug therapy: Secondary | ICD-10-CM | POA: Insufficient documentation

## 2015-05-04 DIAGNOSIS — J45901 Unspecified asthma with (acute) exacerbation: Secondary | ICD-10-CM | POA: Insufficient documentation

## 2015-05-04 DIAGNOSIS — Z862 Personal history of diseases of the blood and blood-forming organs and certain disorders involving the immune mechanism: Secondary | ICD-10-CM | POA: Insufficient documentation

## 2015-05-04 DIAGNOSIS — I10 Essential (primary) hypertension: Secondary | ICD-10-CM | POA: Insufficient documentation

## 2015-05-04 DIAGNOSIS — J4531 Mild persistent asthma with (acute) exacerbation: Secondary | ICD-10-CM

## 2015-05-04 DIAGNOSIS — G43909 Migraine, unspecified, not intractable, without status migrainosus: Secondary | ICD-10-CM | POA: Insufficient documentation

## 2015-05-04 MED ORDER — PREDNISONE 10 MG PO TABS: 60.0000 mg | ORAL_TABLET | Freq: Every day | ORAL | Status: DC

## 2015-05-04 MED ORDER — ALBUTEROL SULFATE (2.5 MG/3ML) 0.083% IN NEBU: 5.0000 mg | INHALATION_SOLUTION | Freq: Once | RESPIRATORY_TRACT | Status: DC

## 2015-05-04 MED ORDER — IPRATROPIUM-ALBUTEROL 0.5-2.5 (3) MG/3ML IN SOLN
3.0000 mL | Freq: Once | RESPIRATORY_TRACT | Status: AC
  Administered 2015-05-04: 3 mL via RESPIRATORY_TRACT
  Filled 2015-05-04: qty 3

## 2015-05-04 MED ORDER — BENZONATATE 100 MG PO CAPS: 200.0000 mg | ORAL_CAPSULE | Freq: Two times a day (BID) | ORAL | Status: DC | PRN

## 2015-05-04 MED ORDER — ALBUTEROL SULFATE (2.5 MG/3ML) 0.083% IN NEBU
2.5000 mg | INHALATION_SOLUTION | Freq: Once | RESPIRATORY_TRACT | Status: AC
  Administered 2015-05-04: 2.5 mg via RESPIRATORY_TRACT
  Filled 2015-05-04: qty 3

## 2015-05-04 MED ORDER — PREDNISONE 50 MG PO TABS
60.0000 mg | ORAL_TABLET | Freq: Once | ORAL | Status: AC
  Administered 2015-05-04: 60 mg via ORAL
  Filled 2015-05-04: qty 1

## 2015-05-04 MED ORDER — IPRATROPIUM BROMIDE 0.02 % IN SOLN: 0.5000 mg | Freq: Once | RESPIRATORY_TRACT | Status: DC

## 2015-05-04 NOTE — Discharge Instructions (Signed)
We saw you in the ER for your asthma related complains. We gave you some breathing treatments in the ER, and seems like your symptoms have improved. Please take albuterol as needed every 4 hours. Please take the medications prescribed. Please refrain from smoking or smoke exposure. Please see a primary care doctor in 1 week. Return to the ER if your symptoms worsen.   Asthma, Acute Bronchospasm Acute bronchospasm caused by asthma is also referred to as an asthma attack. Bronchospasm means your air passages become narrowed. The narrowing is caused by inflammation and tightening of the muscles in the air tubes (bronchi) in your lungs. This can make it hard to breathe or cause you to wheeze and cough. CAUSES Possible triggers are:  Animal dander from the skin, hair, or feathers of animals.  Dust mites contained in house dust.  Cockroaches.  Pollen from trees or grass.  Mold.  Cigarette or tobacco smoke.  Air pollutants such as dust, household cleaners, hair sprays, aerosol sprays, paint fumes, strong chemicals, or strong odors.  Cold air or weather changes. Cold air may trigger inflammation. Winds increase molds and pollens in the air.  Strong emotions such as crying or laughing hard.  Stress.  Certain medicines such as aspirin or beta-blockers.  Sulfites in foods and drinks, such as dried fruits and wine.  Infections or inflammatory conditions, such as a flu, cold, or inflammation of the nasal membranes (rhinitis).  Gastroesophageal reflux disease (GERD). GERD is a condition where stomach acid backs up into your esophagus.  Exercise or strenuous activity. SIGNS AND SYMPTOMS   Wheezing.  Excessive coughing, particularly at night.  Chest tightness.  Shortness of breath. DIAGNOSIS  Your health care provider will ask you about your medical history and perform a physical exam. A chest X-ray or blood testing may be performed to look for other causes of your symptoms or  other conditions that may have triggered your asthma attack. TREATMENT  Treatment is aimed at reducing inflammation and opening up the airways in your lungs. Most asthma attacks are treated with inhaled medicines. These include quick relief or rescue medicines (such as bronchodilators) and controller medicines (such as inhaled corticosteroids). These medicines are sometimes given through an inhaler or a nebulizer. Systemic steroid medicine taken by mouth or given through an IV tube also can be used to reduce the inflammation when an attack is moderate or severe. Antibiotic medicines are only used if a bacterial infection is present.  HOME CARE INSTRUCTIONS   Rest.  Drink plenty of liquids. This helps the mucus to remain thin and be easily coughed up. Only use caffeine in moderation and do not use alcohol until you have recovered from your illness.  Do not smoke. Avoid being exposed to secondhand smoke.  You play a critical role in keeping yourself in good health. Avoid exposure to things that cause you to wheeze or to have breathing problems.  Keep your medicines up-to-date and available. Carefully follow your health care provider's treatment plan.  Take your medicine exactly as prescribed.  When pollen or pollution is bad, keep windows closed and use an air conditioner or go to places with air conditioning.  Asthma requires careful medical care. See your health care provider for a follow-up as advised. If you are more than [redacted] weeks pregnant and you were prescribed any new medicines, let your obstetrician know about the visit and how you are doing. Follow up with your health care provider as directed.  After you have recovered  from your asthma attack, make an appointment with your outpatient doctor to talk about ways to reduce the likelihood of future attacks. If you do not have a doctor who manages your asthma, make an appointment with a primary care doctor to discuss your asthma. SEEK  IMMEDIATE MEDICAL CARE IF:   You are getting worse.  You have trouble breathing. If severe, call your local emergency services (911 in the [place]  You develop chest pain or discomfort.  You are vomiting.  You are not able to keep fluids down.  You are coughing up yellow, green, brown, or bloody sputum.  You have a fever and your symptoms suddenly get worse.  You have trouble swallowing. MAKE SURE YOU:   Understand these instructions.  Will watch your condition.  Will get help right away if you are not doing well or get worse.   This information is not intended to replace advice given to you by your health care provider. Make sure you discuss any questions you have with your health care provider.   Document Released: 07/26/2006 Document Revised: 04/15/2013 Document Reviewed: 10/16/2012 Elsevier Interactive Patient Education 2016 [employer]  Asthma, Adult Asthma is a recurring condition in which the airways tighten and narrow. Asthma can make it difficult to breathe. It can cause coughing, wheezing, and shortness of breath. Asthma episodes, also called asthma attacks, range from minor to life-threatening. Asthma cannot be cured, but medicines and lifestyle changes can help control it. CAUSES Asthma is believed to be caused by inherited (genetic) and environmental factors, but its exact cause is unknown. Asthma may be triggered by allergens, lung infections, or irritants in the air. Asthma triggers are different for each person. Common triggers include:   Animal dander.  Dust mites.  Cockroaches.  Pollen from trees or grass.  Mold.  Smoke.  Air pollutants such as dust, household cleaners, hair sprays, aerosol sprays, paint fumes, strong chemicals, or strong odors.  Cold air, weather changes, and winds (which increase molds and pollens in the air).  Strong emotional expressions such as crying or laughing hard.  Stress.  Certain medicines (such as aspirin) or  types of drugs (such as beta-blockers).  Sulfites in foods and drinks. Foods and drinks that may contain sulfites include dried fruit, potato chips, and sparkling grape juice.  Infections or inflammatory conditions such as the flu, a cold, or an inflammation of the nasal membranes (rhinitis).  Gastroesophageal reflux disease (GERD).  Exercise or strenuous activity. SYMPTOMS Symptoms may occur immediately after asthma is triggered or many hours later. Symptoms include:  Wheezing.  Excessive nighttime or early morning coughing.  Frequent or severe coughing with a common cold.  Chest tightness.  Shortness of breath. DIAGNOSIS  The diagnosis of asthma is made by a review of your medical history and a physical exam. Tests may also be performed. These may include:  Lung function studies. These tests show how much air you breathe in and out.  Allergy tests.  Imaging tests such as X-rays. TREATMENT  Asthma cannot be cured, but it can usually be controlled. Treatment involves identifying and avoiding your asthma triggers. It also involves medicines. There are 2 classes of medicine used for asthma treatment:   Controller medicines. These prevent asthma symptoms from occurring. They are usually taken every day.  Reliever or rescue medicines. These quickly relieve asthma symptoms. They are used as needed and provide short-term relief. Your health care provider will help you create an asthma action plan. An asthma action plan  is a written plan for managing and treating your asthma attacks. It includes a list of your asthma triggers and how they may be avoided. It also includes information on when medicines should be taken and when their dosage should be changed. An action plan may also involve the use of a device called a peak flow meter. A peak flow meter measures how well the lungs are working. It helps you monitor your condition. HOME CARE INSTRUCTIONS   Take medicines only as directed by  your health care provider. Speak with your health care provider if you have questions about how or when to take the medicines.  Use a peak flow meter as directed by your health care provider. Record and keep track of readings.  Understand and use the action plan to help minimize or stop an asthma attack without needing to seek medical care.  Control your home environment in the following ways to help prevent asthma attacks:  Do not smoke. Avoid being exposed to secondhand smoke.  Change your heating and air conditioning filter regularly.  Limit your use of fireplaces and wood stoves.  Get rid of pests (such as roaches and mice) and their droppings.  Throw away plants if you see mold on them.  Clean your floors and dust regularly. Use unscented cleaning products.  Try to have someone else vacuum for you regularly. Stay out of rooms while they are being vacuumed and for a short while afterward. If you vacuum, use a dust mask from a hardware store, a double-layered or microfilter vacuum cleaner bag, or a vacuum cleaner with a HEPA filter.  Replace carpet with wood, tile, or vinyl flooring. Carpet can trap dander and dust.  Use allergy-proof pillows, mattress covers, and box spring covers.  Wash bed sheets and blankets every week in hot water and dry them in a dryer.  Use blankets that are made of polyester or cotton.  Clean bathrooms and kitchens with bleach. If possible, have someone repaint the walls in these rooms with mold-resistant paint. Keep out of the rooms that are being cleaned and painted.  Wash hands frequently. SEEK MEDICAL CARE IF:   You have wheezing, shortness of breath, or a cough even if taking medicine to prevent attacks.  The colored mucus you cough up (sputum) is thicker than usual.  Your sputum changes from clear or white to yellow, green, gray, or bloody.  You have any problems that may be related to the medicines you are taking (such as a rash, itching,  swelling, or trouble breathing).  You are using a reliever medicine more than 2-3 times per week.  Your peak flow is still at 50-79% of your personal best after following your action plan for 1 hour.  You have a fever. SEEK IMMEDIATE MEDICAL CARE IF:   You seem to be getting worse and are unresponsive to treatment during an asthma attack.  You are short of breath even at rest.  You get short of breath when doing very little physical activity.  You have difficulty eating, drinking, or talking due to asthma symptoms.  You develop chest pain.  You develop a fast heartbeat.  You have a bluish color to your lips or fingernails.  You are light-headed, dizzy, or faint.  Your peak flow is less than 50% of your personal best.   This information is not intended to replace advice given to you by your health care provider. Make sure you discuss any questions you have with your health care  provider.   Document Released: 04/10/2005 Document Revised: 12/30/2014 Document Reviewed: 11/07/2012 Elsevier Interactive Patient Education [employer]2016 [employer] Inc[employer]

## 2015-05-04 NOTE — ED Notes (Addendum)
Pt states that she was seen here on Friday for bronchitis, reports she is not feeling any better, and the cough is keeping her from sleeping. Has completed Prednisone dose and using inhaler as needed.

## 2015-05-04 NOTE — ED Notes (Signed)
Pt verbalizes understanding of d/c instructions and denies any further needs at this time. 

## 2015-05-04 NOTE — ED Notes (Signed)
MD at bedside. 

## 2015-05-07 NOTE — ED Provider Notes (Signed)
CSN: [id number]     Arrival date & time 05/04/15  0358 History   First MD Initiated Contact with Patient 05/04/15 [phone]     Chief Complaint  Patient presents with  . Cough     (Consider location/radiation/quality/duration/timing/severity/associated sxs/prior Treatment) HPI Comments: SUBJECTIVE:  Mary Hanson is a 40 y.o. female seen urgently with exacerbation of asthma for few days. Wheezing is described as persistent, with transient and intermediate relief from bronchodilators. Associated symptoms - cough, postnasal drip. Current asthma medications: bronchodilators and prednisone. Patient denies smoking cigarettes.       Patient is a 40 y.o. female presenting with cough. The history is provided by the patient.  Cough Associated symptoms: wheezing   Associated symptoms: no chest pain, no headaches and no shortness of breath     Past Medical History  Diagnosis Date  . Migraines   . Hypertension     under control with med., has been on med. x 1 yr.  . Allergy-induced asthma     prn inhaler  . Seasonal allergies   . History of anemia     no current problem, per pt.  . Pyogenic granuloma 12/2013    right ring finger   Past Surgical History  Procedure Laterality Date  . Cholecystectomy  2006 or 2007  . Cervical cone biopsy  age 35  . Dilation and evacuation  age 71  . Mass excision Right 01/16/2014    Procedure: EXCISION PYOGENIC GRANULOMA FROM RIGHT RING FINGER;  Surgeon: Dairl Ponder, MD;  Location: [facility];  Service: Orthopedics;  Laterality: Right;  . Dilation and curettage of uterus     Family History  Problem Relation Age of Onset  . Diabetes type II Father   . Hypertension Father   . Hypertension Mother   . Goiter Sister 67    identical twin  . Asthma Sister   . Hypertension Sister   . Stroke Maternal Grandfather   . Diabetes Paternal Grandfather   . Heart disease Paternal Grandfather    Social History  Substance Use Topics  . Smoking  status: Never Smoker   . Smokeless tobacco: Never Used  . Alcohol Use: No     Comment: Wine once a year    OB History    Gravida Para Term Preterm AB TAB SAB Ectopic Multiple Living   1    1 1     0     Review of Systems  Constitutional: Positive for activity change.  Respiratory: Positive for cough and wheezing. Negative for shortness of breath.   Cardiovascular: Negative for chest pain.  Gastrointestinal: Negative for nausea, vomiting and abdominal pain.  Genitourinary: Negative for dysuria.  Musculoskeletal: Negative for neck pain.  Neurological: Negative for headaches.      Allergies  Ivp dye; Augmentin; Lisinopril; and Zofran  Home Medications   Prior to Admission medications   Medication Sig Start Date End Date Taking? Authorizing Provider  albuterol (PROVENTIL HFA;VENTOLIN HFA) 108 (90 BASE) MCG/ACT inhaler Inhale 2 puffs into the lungs every 4 (four) hours as needed for wheezing or shortness of breath. 03/01/15   Ardith Dark, MD  benzonatate (TESSALON) 100 MG capsule Take 2 capsules (200 mg total) by mouth 2 (two) times daily as needed for cough. 05/04/15   Derwood Kaplan, MD  cetirizine (ZYRTEC) 10 MG tablet Take 10 mg by mouth.    Historical Provider, MD  Diclofenac Potassium 50 MG PACK Take 50 mg by mouth once as needed. Take once daily  as needed with headache onset. Please take with food Patient not taking: Reported on 03/07/2015 11/15/14   Anson FretAntonia B Ahern, MD  doxycycline (VIBRA-TABS) 100 MG tablet Take 1 tablet (100 mg total) by mouth 2 (two) times daily. Patient not taking: Reported on 03/07/2015 01/27/15   Jamal CollinJames R Joyner, MD  eletriptan (RELPAX) 40 MG tablet Take 1 tablet (40 mg total) by mouth as needed for migraine or headache. May repeat in 2 hours if headache persists or recurs. 11/15/14   Anson FretAntonia B Ahern, MD  hydrochlorothiazide (HYDRODIURIL) 12.5 MG tablet Take 1 tablet (12.5 mg total) by mouth daily. 02/23/15   Reva Boresanya S Pratt, MD  hydrochlorothiazide  (HYDRODIURIL) 25 MG tablet Take 0.5 tablets (12.5 mg total) by mouth daily. 02/26/15   Armando ReichertHeather D Hogan, CNM  ibuprofen (ADVIL,MOTRIN) 200 MG tablet Take 800 mg by mouth every 6 (six) hours as needed for headache.    Historical Provider, MD  megestrol (MEGACE) 20 MG tablet Take 1 tablet twice a day for bleeding. If needed, can take 2 tablets twice a day 03/07/15   Kathrynn RunningNoah Bedford Wouk, MD  predniSONE (DELTASONE) 10 MG tablet Take 6 tablets (60 mg total) by mouth daily. 05/04/15   Derwood KaplanAnkit Lynx Goodrich, MD  prochlorperazine (COMPAZINE) 5 MG tablet Take 1-2 tablets (5-10 mg total) by mouth every 8 (eight) hours as needed for nausea or vomiting. 11/02/14   Smitty CordsAlexander J Karamalegos, DO  SUMAtriptan (IMITREX) 100 MG tablet Take 1 tablet (100 mg total) by mouth once. May repeat in 2 hours if headache persists or recurs. 11/11/14   Anson FretAntonia B Ahern, MD  SUMAtriptan-naproxen (TREXIMET) 85-500 MG per tablet Take 1 tablet by mouth every 2 (two) hours as needed for migraine. 11/15/14   Anson FretAntonia B Ahern, MD   BP 158/98 mmHg  Pulse 95  Temp(Src) 98.1 F (36.7 C) (Oral)  Resp 20  Ht 5\' 6"  (1.676 m)  Wt 300 lb (136.079 kg)  BMI 48.44 kg/m2  SpO2 99%  LMP 04/15/2015 Physical Exam  Constitutional: She is oriented to person, place, and time. She appears well-developed.  HENT:  Head: Normocephalic and atraumatic.  Eyes: EOM are normal.  Neck: Normal range of motion. Neck supple.  Cardiovascular: Normal rate.   Pulmonary/Chest: Effort normal. No respiratory distress. She has wheezes.  Abdominal: Bowel sounds are normal.  Neurological: She is alert and oriented to person, place, and time.  Skin: Skin is warm and dry.  Nursing note and vitals reviewed.   ED Course  Procedures (including critical care time) Labs Review Labs Reviewed - No data to display  Imaging Review No results found. I have personally reviewed and evaluated these images and lab results as part of my medical decision-making.   EKG  Interpretation None      MDM   Final diagnoses:  Asthma, extrinsic with exacerbation, mild persistent    ASSESSMENT:  Asthma - acute exacerbation  PLAN:  Prednsone burst - longer duration and more bronchodilators.  RX per orders - Use bronchodilator MDI 2 puff q4h prn, steroid MDI regularly to prevent asthma and oral steroid.   Derwood KaplanAnkit Jedrek Dinovo, MD 05/07/15 1940

## 2015-05-13 MED FILL — MEGESTROL 20 MG TABLET: 20 | 11 days supply | Qty: 45 | Fill #1

## 2015-05-14 ENCOUNTER — Other Ambulatory Visit: Payer: Self-pay | Admitting: Advanced Practice Midwife

## 2015-05-18 ENCOUNTER — Other Ambulatory Visit: Payer: Self-pay | Admitting: *Deleted

## 2015-05-18 MED ORDER — HYDROCHLOROTHIAZIDE 25 MG PO TABS: 12.5000 mg | ORAL_TABLET | Freq: Every day | ORAL | Status: DC

## 2015-05-19 ENCOUNTER — Ambulatory Visit: Payer: 59 | Admitting: Family Medicine

## 2015-05-20 ENCOUNTER — Telehealth: Payer: Self-pay | Admitting: *Deleted

## 2015-05-20 DIAGNOSIS — I10 Essential (primary) hypertension: Secondary | ICD-10-CM

## 2015-05-20 MED ORDER — HYDROCHLOROTHIAZIDE 25 MG PO TABS: 12.5000 mg | ORAL_TABLET | Freq: Every day | ORAL | Status: DC

## 2015-05-20 MED FILL — HYDROCHLOROTHIAZIDE 25 MG T: 25 | 60 days supply | Qty: 30 | Fill #0

## 2015-05-20 NOTE — Telephone Encounter (Signed)
-----   Message from Olevia Bowens sent at 05/20/2015 11:29 AM EST ----- Regarding: Rx Refill Contact: [phone] Patient called and wanted a refill on her blood pressure medicine, states that Shawnie Pons is her PCP as well as GYN Uses [facility]

## 2015-05-20 NOTE — Telephone Encounter (Signed)
Prescription for HCTZ was written on 05-18-15, appears the rx printed off at the office.  Sent prescription order to the pharmacy via e-prescribed.

## 2015-05-20 NOTE — Telephone Encounter (Signed)
Rx was approved for refill but stated print.  Will resend electronically.  Clovis Pu, RN

## 2015-05-20 NOTE — Addendum Note (Signed)
Addended by: Clovis Pu on: 05/20/2015 11:45 AM   Modules accepted: Orders

## 2015-05-31 ENCOUNTER — Encounter: Payer: Self-pay | Admitting: Family Medicine

## 2015-05-31 ENCOUNTER — Ambulatory Visit (INDEPENDENT_AMBULATORY_CARE_PROVIDER_SITE_OTHER): Payer: Self-pay | Admitting: Family Medicine

## 2015-05-31 VITALS — BP 140/86 | HR 81 | Ht 66.0 in | Wt 307.0 lb

## 2015-05-31 DIAGNOSIS — N920 Excessive and frequent menstruation with regular cycle: Secondary | ICD-10-CM | POA: Insufficient documentation

## 2015-05-31 DIAGNOSIS — B354 Tinea corporis: Secondary | ICD-10-CM

## 2015-05-31 DIAGNOSIS — I10 Essential (primary) hypertension: Secondary | ICD-10-CM

## 2015-05-31 MED ORDER — HYDROCHLOROTHIAZIDE 25 MG PO TABS: 25.0000 mg | ORAL_TABLET | Freq: Every day | ORAL | Status: DC

## 2015-05-31 MED ORDER — MEGESTROL ACETATE 40 MG PO TABS: 40.0000 mg | ORAL_TABLET | Freq: Two times a day (BID) | ORAL | Status: DC

## 2015-05-31 MED ORDER — NYSTATIN 100000 UNIT/GM EX OINT: 1.0000 "application " | TOPICAL_OINTMENT | Freq: Two times a day (BID) | CUTANEOUS | Status: DC

## 2015-05-31 MED FILL — NYSTATIN 100,000 UNITS/GM O: 100000 | 15 days supply | Qty: 30 | Fill #0

## 2015-05-31 NOTE — Assessment & Plan Note (Signed)
Continue Megace as needed.

## 2015-05-31 NOTE — Progress Notes (Signed)
    Subjective:    Patient ID: Mary Hanson is a 40 y.o. female presenting with Dry skin; Vaginal Bleeding; and Hypertension  on 05/31/2015  HPI: Reports angioedema related to lisinopril. HCTZ 12.5 for now. But reports a lot of fatigue. Also has some menorrhagia. Treated with Megace. Has not really helped. Also reports vaginal dryness and peeling. Using condoms for protection but infrequent intercourse.  Review of Systems  Constitutional: Negative for fever and chills.  Respiratory: Negative for shortness of breath.   Cardiovascular: Negative for chest pain.  Gastrointestinal: Negative for nausea, vomiting and abdominal pain.  Genitourinary: Negative for dysuria.  Skin: Negative for rash.      Objective:    BP 140/86 mmHg  Pulse 81  Ht  (1.676 m)  Wt 307 lb (139.254 kg)  BMI 49.57 kg/m2  SpO2 99%  LMP 05/17/2015 (Approximate) Physical Exam  Constitutional: She is oriented to person, place, and time. She appears well-developed and well-nourished. No distress.  HENT:  Head: Normocephalic and atraumatic.  Eyes: No scleral icterus.  Neck: Neck supple.  Cardiovascular: Normal rate.   Pulmonary/Chest: Effort normal.  Abdominal: Soft.  Genitourinary: Vagina normal and uterus normal.  Erythematous area in intertriginous areas with skin peeling.  Neurological: She is alert and oriented to person, place, and time.  Skin: Skin is warm and dry.  Psychiatric: She has a normal mood and affect.        Assessment & Plan:   Problem List Items Addressed This Visit      Unprioritized   Hypertension    Will increase dose of her HCTZ to improve BP control      Relevant Medications   hydrochlorothiazide (HYDRODIURIL) 25 MG tablet   Menorrhagia - Primary    Continue Megace as needed.      Relevant Medications   megestrol (MEGACE) 40 MG tablet    Other Visit Diagnoses    Tinea corporis        treat with Nystatin, then keep area dry.    Relevant Medications    nystatin ointment (MYCOSTATIN)       Return in about 3 months (around 08/28/2015) for a follow-up.  Mary Hanson S 05/31/2015 3:03 PM

## 2015-05-31 NOTE — Assessment & Plan Note (Signed)
Will increase dose of her HCTZ to improve BP control

## 2015-05-31 NOTE — Patient Instructions (Addendum)
Body Ringworm Ringworm (tinea corporis) is a fungal infection of the skin on the body. This infection is not caused by worms, but is actually caused by a fungus. Fungus normally lives on the top of your skin and can be useful. However, in the case of ringworms, the fungus grows out of control and causes a skin infection. It can involve any area of skin on the body and can spread easily from one person to another (contagious). Ringworm is a common problem for children, but it can affect adults as well. Ringworm is also often found in athletes, especially wrestlers who share equipment and mats.  CAUSES  Ringworm of the body is caused by a fungus called dermatophyte. It can spread by:  Touchingother people who are infected.  Touchinginfected pets.  Touching or sharingobjects that have been in contact with the infected person or pet (hats, combs, towels, clothing, sports equipment). SYMPTOMS   Itchy, raised red spots and bumps on the skin.  Ring-shaped rash.  Redness near the border of the rash with a clear center.  Dry and scaly skin on or around the rash. Not every person develops a ring-shaped rash. Some develop only the red, scaly patches. DIAGNOSIS  Most often, ringworm can be diagnosed by performing a skin exam. Your caregiver may choose to take a skin scraping from the affected area. The sample will be examined under the microscope to see if the fungus is present.  TREATMENT  Body ringworm may be treated with a topical antifungal cream or ointment. Sometimes, an antifungal shampoo that can be used on your body is prescribed. You may be prescribed antifungal medicines to take by mouth if your ringworm is severe, keeps coming back, or lasts a long time.  HOME CARE INSTRUCTIONS   Only take over-the-counter or prescription medicines as directed by your caregiver.  Wash the infected area and dry it completely before applying yourcream or ointment.  When using antifungal shampoo to  treat the ringworm, leave the shampoo on the body for 3-5 minutes before rinsing.   Wear loose clothing to stop clothes from rubbing and irritating the rash.  Wash or change your bed sheets every night while you have the rash.  Have your pet treated by your veterinarian if it has the same infection. To prevent ringworm:   Practice good hygiene.  Wear sandals or shoes in public places and showers.  Do not share personal items with others.  Avoid touching red patches of skin on other people.  Avoid touching pets that have bald spots or wash your hands after doing so. SEEK MEDICAL CARE IF:   Your rash continues to spread after 7 days of treatment.  Your rash is not gone in 4 weeks.  The area around your rash becomes red, warm, tender, and swollen.   This information is not intended to replace advice given to you by your health care provider. Make sure you discuss any questions you have with your health care provider.   Document Released: 04/07/2000 Document Revised: 01/03/2012 Document Reviewed: 10/23/2011 Elsevier Interactive Patient Education 2016 [employer] Inc[employer] DASH Eating Plan DASH stands for "Dietary Approaches to Stop Hypertension." The DASH eating plan is a healthy eating plan that has been shown to reduce high blood pressure (hypertension). Additional health benefits may include reducing the risk of type 2 diabetes mellitus, heart disease, and stroke. The DASH eating plan may also help with weight loss. WHAT DO I NEED TO KNOW ABOUT THE DASH EATING PLAN? For the DASH  eating plan, you will follow these general guidelines:  Choose foods with a percent daily value for sodium of less than 5% (as listed on the food label).  Use salt-free seasonings or herbs instead of table salt or sea salt.  Check with your health care provider or pharmacist before using salt substitutes.  Eat lower-sodium products, often labeled as "lower sodium" or "no salt added."  Eat fresh  foods.  Eat more vegetables, fruits, and low-fat dairy products.  Choose whole grains. Look for the word "whole" as the first word in the ingredient list.  Choose fish and skinless chicken or [place] more often than red meat. Limit fish, poultry, and meat to 6 oz (170 g) each day.  Limit sweets, desserts, sugars, and sugary drinks.  Choose heart-healthy fats.  Limit cheese to 1 oz (28 g) per day.  Eat more home-cooked food and less restaurant, buffet, and fast food.  Limit fried foods.  Cook foods using methods other than frying.  Limit canned vegetables. If you do use them, rinse them well to decrease the sodium.  When eating at a restaurant, ask that your food be prepared with less salt, or no salt if possible. WHAT FOODS CAN I EAT? Seek help from a dietitian for individual calorie needs. Grains Whole grain or whole wheat bread. Brown rice. Whole grain or whole wheat pasta. Quinoa, bulgur, and whole grain cereals. Low-sodium cereals. Corn or whole wheat flour tortillas. Whole grain cornbread. Whole grain crackers. Low-sodium crackers. Vegetables Fresh or frozen vegetables (raw, steamed, roasted, or grilled). Low-sodium or reduced-sodium tomato and vegetable juices. Low-sodium or reduced-sodium tomato sauce and paste. Low-sodium or reduced-sodium canned vegetables.  Fruits All fresh, canned (in natural juice), or frozen fruits. Meat and Other Protein Products Ground beef (85% or leaner), grass-fed beef, or beef trimmed of fat. Skinless chicken or [place]. Ground chicken or [place]. Pork trimmed of fat. All fish and seafood. Eggs. Dried beans, peas, or lentils. Unsalted nuts and seeds. Unsalted canned beans. Dairy Low-fat dairy products, such as skim or 1% milk, 2% or reduced-fat cheeses, low-fat ricotta or cottage cheese, or plain low-fat yogurt. Low-sodium or reduced-sodium cheeses. Fats and Oils Tub margarines without trans fats. Light or reduced-fat mayonnaise and salad dressings  (reduced sodium). Avocado. Safflower, olive, or canola oils. Natural peanut or almond butter. Other Unsalted popcorn and pretzels. The items listed above may not be a complete list of recommended foods or beverages. Contact your dietitian for more options. WHAT FOODS ARE NOT RECOMMENDED? Grains White bread. White pasta. White rice. Refined cornbread. Bagels and croissants. Crackers that contain trans fat. Vegetables Creamed or fried vegetables. Vegetables in a cheese sauce. Regular canned vegetables. Regular canned tomato sauce and paste. Regular tomato and vegetable juices. Fruits Dried fruits. Canned fruit in light or heavy syrup. Fruit juice. Meat and Other Protein Products Fatty cuts of meat. Ribs, chicken wings, bacon, sausage, bologna, salami, chitterlings, fatback, hot dogs, bratwurst, and packaged luncheon meats. Salted nuts and seeds. Canned beans with salt. Dairy Whole or 2% milk, cream, half-and-half, and cream cheese. Whole-fat or sweetened yogurt. Full-fat cheeses or blue cheese. Nondairy creamers and whipped toppings. Processed cheese, cheese spreads, or cheese curds. Condiments Onion and garlic salt, seasoned salt, table salt, and sea salt. Canned and packaged gravies. Worcestershire sauce. Tartar sauce. Barbecue sauce. Teriyaki sauce. Soy sauce, including reduced sodium. Steak sauce. Fish sauce. Oyster sauce. Cocktail sauce. Horseradish. Ketchup and mustard. Meat flavorings and tenderizers. Bouillon cubes. Hot sauce. Tabasco sauce. Marinades. Taco seasonings.  Relishes. Fats and Oils Butter, stick margarine, lard, shortening, ghee, and bacon fat. Coconut, palm kernel, or palm oils. Regular salad dressings. Other Pickles and olives. Salted popcorn and pretzels. The items listed above may not be a complete list of foods and beverages to avoid. Contact your dietitian for more information. WHERE CAN I FIND MORE INFORMATION? National Heart, Lung, and Blood Institute:  [employer]Prom[employer]it   This information is not intended to replace advice given to you by your health care provider. Make sure you discuss any questions you have with your health care provider.   Document Released: 03/30/2011 Document Revised: 05/01/2014 Document Reviewed: 02/12/2013 Elsevier Interactive Patient Education [employer]

## 2015-06-30 MED FILL — HYDROCHLOROTHIAZIDE 25 MG T: 25 | 90 days supply | Qty: 90 | Fill #0

## 2015-09-20 ENCOUNTER — Emergency Department (HOSPITAL_COMMUNITY)
Admission: EM | Admit: 2015-09-20 | Discharge: 2015-09-20 | Disposition: A | Discharge status: Home / Self Care | Payer: Self-pay | Attending: Emergency Medicine | Admitting: Emergency Medicine

## 2015-09-20 ENCOUNTER — Encounter (HOSPITAL_COMMUNITY): Payer: Self-pay | Admitting: Emergency Medicine

## 2015-09-20 DIAGNOSIS — Z79899 Other long term (current) drug therapy: Secondary | ICD-10-CM | POA: Insufficient documentation

## 2015-09-20 DIAGNOSIS — I10 Essential (primary) hypertension: Secondary | ICD-10-CM | POA: Insufficient documentation

## 2015-09-20 DIAGNOSIS — M62838 Other muscle spasm: Secondary | ICD-10-CM | POA: Insufficient documentation

## 2015-09-20 MED ORDER — DIAZEPAM 5 MG PO TABS: 5.0000 mg | ORAL_TABLET | Freq: Four times a day (QID) | ORAL | Status: DC | PRN

## 2015-09-20 NOTE — ED Notes (Signed)
Discharge instructions, follow up care, and rx x1 reviewed with patient. Patient verbalized understanding. 

## 2015-09-20 NOTE — ED Provider Notes (Signed)
CSN: [id number]     Arrival date & time 09/20/15  0865 History   First MD Initiated Contact with Patient 09/20/15 [phone]     Chief Complaint  Patient presents with  . Neck Pain     (Consider location/radiation/quality/duration/timing/severity/associated sxs/prior Treatment) Patient is a 40 y.o. female presenting with neck pain. The history is provided by the patient.  Neck Pain Pain location:  R side Pain radiates to:  Does not radiate Pain severity:  Moderate Onset quality:  Gradual Duration:  2 days Timing:  Constant Progression:  Worsening Chronicity:  New Relieved by:  Nothing Worsened by:  Bending, twisting and position Ineffective treatments:  None tried Associated symptoms: no chest pain, no fever and no headaches    40 yo F With a chief complaints of right-sided neck pain. This been going on for the past couple days. Patient denies any injuries denies manipulation. Started with a headache and then the headache resolved. She denies any neurologic complaints.  Past Medical History  Diagnosis Date  . Migraines   . Hypertension     under control with med., has been on med. x 1 yr.  . Allergy-induced asthma     prn inhaler  . Seasonal allergies   . History of anemia     no current problem, per pt.  . Pyogenic granuloma 12/2013    right ring finger   Past Surgical History  Procedure Laterality Date  . Cholecystectomy  2006 or 2007  . Cervical cone biopsy  age 23  . Dilation and evacuation  age 89  . Mass excision Right 01/16/2014    Procedure: EXCISION PYOGENIC GRANULOMA FROM RIGHT RING FINGER;  Surgeon: Dairl Ponder, MD;  Location: [facility];  Service: Orthopedics;  Laterality: Right;  . Dilation and curettage of uterus     Family History  Problem Relation Age of Onset  . Diabetes type II Father   . Hypertension Father   . Hypertension Mother   . Goiter Sister 84    identical twin  . Asthma Sister   . Hypertension Sister   . Stroke Maternal  Grandfather   . Diabetes Paternal Grandfather   . Heart disease Paternal Grandfather    Social History  Substance Use Topics  . Smoking status: Never Smoker   . Smokeless tobacco: Never Used  . Alcohol Use: No     Comment: Wine once a year    OB History    Gravida Para Term Preterm AB TAB SAB Ectopic Multiple Living   1    1 1     0     Review of Systems  Constitutional: Negative for fever and chills.  HENT: Negative for congestion and rhinorrhea.   Eyes: Negative for redness and visual disturbance.  Respiratory: Negative for shortness of breath and wheezing.   Cardiovascular: Negative for chest pain and palpitations.  Gastrointestinal: Negative for nausea and vomiting.  Genitourinary: Negative for dysuria and urgency.  Musculoskeletal: Positive for myalgias, arthralgias and neck pain.  Skin: Negative for pallor and wound.  Neurological: Negative for dizziness and headaches.      Allergies  Ivp dye; Augmentin; Lisinopril; and Zofran  Home Medications   Prior to Admission medications   Medication Sig Start Date End Date Taking? Authorizing Provider  albuterol (PROVENTIL HFA;VENTOLIN HFA) 108 (90 BASE) MCG/ACT inhaler Inhale 2 puffs into the lungs every 4 (four) hours as needed for wheezing or shortness of breath. 03/01/15   Ardith Dark, MD  cetirizine Harless Nakayama)  10 MG tablet Take 10 mg by mouth.    Historical Provider, MD  diazepam (VALIUM) 5 MG tablet Take 1 tablet (5 mg total) by mouth every 6 (six) hours as needed for anxiety (spasms). 09/20/15   Melene Plan, DO  eletriptan (RELPAX) 40 MG tablet Take 1 tablet (40 mg total) by mouth as needed for migraine or headache. May repeat in 2 hours if headache persists or recurs. 11/15/14   Anson Fret, MD  hydrochlorothiazide (HYDRODIURIL) 25 MG tablet Take 1 tablet (25 mg total) by mouth daily. 05/31/15   Reva Bores, MD  ibuprofen (ADVIL,MOTRIN) 200 MG tablet Take 800 mg by mouth every 6 (six) hours as needed for headache.     Historical Provider, MD  megestrol (MEGACE) 40 MG tablet Take 1 tablet (40 mg total) by mouth 2 (two) times daily. Take 1 tablet twice a day for bleeding. If needed, can take 2 tablets twice a day 05/31/15   Reva Bores, MD  nystatin ointment (MYCOSTATIN) Apply 1 application topically 2 (two) times daily. 05/31/15   Reva Bores, MD  prochlorperazine (COMPAZINE) 5 MG tablet Take 1-2 tablets (5-10 mg total) by mouth every 8 (eight) hours as needed for nausea or vomiting. 11/02/14   Smitty Cords, DO  SUMAtriptan (IMITREX) 100 MG tablet Take 1 tablet (100 mg total) by mouth once. May repeat in 2 hours if headache persists or recurs. 11/11/14   Anson Fret, MD   BP 160/94 mmHg  Pulse 94  Temp(Src) 97.7 F (36.5 C) (Oral)  Resp 16  SpO2 99% Physical Exam  Constitutional: She is oriented to person, place, and time. She appears well-developed and well-nourished. No distress.  HENT:  Head: Normocephalic and atraumatic.  Eyes: EOM are normal. Pupils are equal, round, and reactive to light.  Neck: Normal range of motion. Neck supple.  Cardiovascular: Normal rate and regular rhythm.  Exam reveals no gallop and no friction rub.   No murmur heard. Pulmonary/Chest: Effort normal. She has no wheezes. She has no rales.  Abdominal: Soft. She exhibits no distension. There is no tenderness. There is no rebound and no guarding.  Musculoskeletal: She exhibits tenderness (TTP about the R trapezius). She exhibits no edema.  Neurological: She is alert and oriented to person, place, and time.  Skin: Skin is warm and dry. She is not diaphoretic.  Psychiatric: She has a normal mood and affect. Her behavior is normal.  Nursing note and vitals reviewed.   ED Course  Procedures (including critical care time) Labs Review Labs Reviewed - No data to display  Imaging Review No results found. I have personally reviewed and evaluated these images and lab results as part of my medical decision-making.    EKG Interpretation None      MDM   Final diagnoses:  Trapezius muscle spasm    40 yo F with right-sided trapezius pain. Reproducible with palpation of the muscle belly. Feel this is likely a trapezius spasm. No part of the history concerning for carotid artery dissection.  2:37 PM:  I have discussed the diagnosis/risks/treatment options with the patient and family and believe the pt to be eligible for discharge home to follow-up with PCP. We also discussed returning to the ED immediately if new or worsening sx occur. We discussed the sx which are most concerning (e.g., sudden worsening pain, fever, inability to tolerate by mouth) that necessitate immediate return. Medications administered to the patient during their visit and any new prescriptions provided  to the patient are listed below.  Medications given during this visit Medications - No data to display  Discharge Medication List as of 09/20/2015  8:21 AM    START taking these medications   Details  diazepam (VALIUM) 5 MG tablet Take 1 tablet (5 mg total) by mouth every 6 (six) hours as needed for anxiety (spasms)., Starting 09/20/2015, Until Discontinued, Print        The patient appears reasonably screen and/or stabilized for discharge and I doubt any other medical condition or other [facility] requiring further screening, evaluation, or treatment in the ED at this time prior to discharge.      Melene Planan Anjalina Bergevin, DO 09/20/15 1438

## 2015-09-20 NOTE — Discharge Instructions (Signed)
Take 4 over the counter ibuprofen tablets 3 times a day or 2 over-the-counter naproxen tablets twice a day for pain. ° °Heat Therapy °Heat therapy can help ease sore, stiff, injured, and tight muscles and joints. Heat relaxes your muscles, which may help ease your pain.  °RISKS AND COMPLICATIONS °If you have any of the following conditions, do not use heat therapy unless your health care provider has approved: °· Poor circulation. °· Healing wounds or scarred skin in the area being treated. °· Diabetes, heart disease, or high blood pressure. °· Not being able to feel (numbness) the area being treated. °· Unusual swelling of the area being treated. °· Active infections. °· Blood clots. °· Cancer. °· Inability to communicate pain. This may include young children and people who have problems with their brain function (dementia). °· Pregnancy. °Heat therapy should only be used on old, pre-existing, or long-lasting (chronic) injuries. Do not use heat therapy on new injuries unless directed by your health care provider. °HOW TO USE HEAT THERAPY °There are several different kinds of heat therapy, including: °· Moist heat pack. °· Warm water bath. °· Hot water bottle. °· Electric heating pad. °· Heated gel pack. °· Heated wrap. °· Electric heating pad. °Use the heat therapy method suggested by your health care provider. Follow your health care provider's instructions on when and how to use heat therapy. °GENERAL HEAT THERAPY RECOMMENDATIONS °· Do not sleep while using heat therapy. Only use heat therapy while you are awake. °· Your skin may turn pink while using heat therapy. Do not use heat therapy if your skin turns red. °· Do not use heat therapy if you have new pain. °· High heat or long exposure to heat can cause burns. Be careful when using heat therapy to avoid burning your skin. °· Do not use heat therapy on areas of your skin that are already irritated, such as with a rash or sunburn. °SEEK MEDICAL CARE IF: °· You  have blisters, redness, swelling, or numbness. °· You have new pain. °· Your pain is worse. °MAKE SURE YOU: °· Understand these instructions. °· Will watch your condition. °· Will get help right away if you are not doing well or get worse. °  °This information is not intended to replace advice given to you by your health care provider. Make sure you discuss any questions you have with your health care provider. °  °Document Released: 07/03/2011 Document Revised: 05/01/2014 Document Reviewed: 06/03/2013 °Elsevier Interactive Patient Education ©2016 [employer] ° °

## 2015-09-20 NOTE — ED Notes (Addendum)
Pt c/o right medial and bilateral neck pain radiating to shoulders worse with movement of neck and neck stiffness onset 5 days ago after migraine headache. Pt initially believed neck pain was due to migraine and would resolve spontaneously. Taking Motrin without relief. Pain reproducible with movement and palpation. Neck easily mobile. No trauma. No CP, SOB, fevers, chills.

## 2015-10-01 MED FILL — HYDROCHLOROTHIAZIDE 25 MG T: 25 | 90 days supply | Qty: 90 | Fill #1

## 2015-11-24 ENCOUNTER — Other Ambulatory Visit: Payer: Self-pay | Admitting: Neurology

## 2015-11-24 DIAGNOSIS — G43001 Migraine without aura, not intractable, with status migrainosus: Secondary | ICD-10-CM

## 2015-12-24 ENCOUNTER — Emergency Department (HOSPITAL_COMMUNITY)
Admission: EM | Admit: 2015-12-24 | Discharge: 2015-12-24 | Disposition: A | Discharge status: Home / Self Care | Payer: Self-pay | Attending: Emergency Medicine | Admitting: Emergency Medicine

## 2015-12-24 ENCOUNTER — Encounter (HOSPITAL_COMMUNITY): Payer: Self-pay | Admitting: Emergency Medicine

## 2015-12-24 DIAGNOSIS — Z791 Long term (current) use of non-steroidal anti-inflammatories (NSAID): Secondary | ICD-10-CM | POA: Insufficient documentation

## 2015-12-24 DIAGNOSIS — I10 Essential (primary) hypertension: Secondary | ICD-10-CM | POA: Insufficient documentation

## 2015-12-24 DIAGNOSIS — Z79899 Other long term (current) drug therapy: Secondary | ICD-10-CM | POA: Insufficient documentation

## 2015-12-24 DIAGNOSIS — J45901 Unspecified asthma with (acute) exacerbation: Secondary | ICD-10-CM | POA: Insufficient documentation

## 2015-12-24 MED ORDER — IPRATROPIUM-ALBUTEROL 0.5-2.5 (3) MG/3ML IN SOLN
3.0000 mL | Freq: Once | RESPIRATORY_TRACT | Status: AC
  Administered 2015-12-24: 3 mL via RESPIRATORY_TRACT
  Filled 2015-12-24: qty 3

## 2015-12-24 MED ORDER — ALBUTEROL SULFATE HFA 108 (90 BASE) MCG/ACT IN AERS
1.0000 | INHALATION_SPRAY | RESPIRATORY_TRACT | Status: DC
  Administered 2015-12-24: 1 via RESPIRATORY_TRACT
  Filled 2015-12-24: qty 6.7

## 2015-12-24 NOTE — ED Provider Notes (Signed)
[facility]-EMERGENCY DEPT Provider Note   CSN: [id number] Arrival date & time: 12/24/15  1614  By signing my name below, I, Modena JanskyAlbert Thayil, attest that this documentation has been prepared under the direction and in the presence of non-physician practitioner, Eyvonne MechanicJeffrey Mayer Vondrak, PA-C. Electronically Signed: Modena JanskyAlbert Thayil, Scribe. 12/24/2015. 4:40 PM.  History   Chief Complaint Chief Complaint  Patient presents with  . Asthma  . Allergies   The history is provided by the patient. No language interpreter was used.   HPI Comments: Mary Hanson is a 40 y.o. female with a hx asthma who presents to the Emergency Department complaining of gradual onset SOB that started today. She states she had difficulty breathing with associated sore throat and ear itchiness, similar to past asthma/allergic episodes. She denies any hx of hospitalizations for asthma, fever, or rhinorrhea.   Past Medical History:  Diagnosis Date  . Allergy-induced asthma    prn inhaler  . History of anemia    no current problem, per pt.  . Hypertension    under control with med., has been on med. x 1 yr.  . Migraines   . Pyogenic granuloma 12/2013   right ring finger  . Seasonal allergies     Patient Active Problem List   Diagnosis Date Noted  . Menorrhagia 05/31/2015  . Peripheral neuralgia 11/11/2014  . Enlarged thyroid 04/03/2013  . Unspecified vitamin D deficiency 07/29/2012  . External hemorrhoid 09/06/2011  . Hypertension 12/29/2010  . Morbid obesity (HCC) 12/29/2010  . Asthma 12/29/2010  . Allergic rhinitis 12/29/2010  . Migraine headache 12/29/2010    Past Surgical History:  Procedure Laterality Date  . CERVICAL CONE BIOPSY  age 40  . CHOLECYSTECTOMY  2006 or 2007  . DILATION AND CURETTAGE OF UTERUS    . DILATION AND EVACUATION  age 40  . MASS EXCISION Right 01/16/2014   Procedure: EXCISION PYOGENIC GRANULOMA FROM RIGHT RING FINGER;  Surgeon: Dairl PonderMatthew Weingold, MD;  Location: [facility];   Service: Orthopedics;  Laterality: Right;    OB History    Gravida Para Term Preterm AB Living   1       1 0   SAB TAB Ectopic Multiple Live Births     1             Home Medications    Prior to Admission medications   Medication Sig Start Date End Date Taking? Authorizing Provider  albuterol (PROVENTIL HFA;VENTOLIN HFA) 108 (90 BASE) MCG/ACT inhaler Inhale 2 puffs into the lungs every 4 (four) hours as needed for wheezing or shortness of breath. 03/01/15   Ardith Darkaleb M Parker, MD  cetirizine (ZYRTEC) 10 MG tablet Take 10 mg by mouth.    Historical Provider, MD  diazepam (VALIUM) 5 MG tablet Take 1 tablet (5 mg total) by mouth every 6 (six) hours as needed for anxiety (spasms). 09/20/15   Melene Planan Floyd, DO  eletriptan (RELPAX) 40 MG tablet Take 1 tablet (40 mg total) by mouth as needed for migraine or headache. May repeat in 2 hours if headache persists or recurs. 11/15/14   Anson FretAntonia B Ahern, MD  hydrochlorothiazide (HYDRODIURIL) 25 MG tablet Take 1 tablet (25 mg total) by mouth daily. 05/31/15   Reva Boresanya S Pratt, MD  ibuprofen (ADVIL,MOTRIN) 200 MG tablet Take 800 mg by mouth every 6 (six) hours as needed for headache.    Historical Provider, MD  megestrol (MEGACE) 40 MG tablet Take 1 tablet (40 mg total) by mouth 2 (two) times daily. Take  1 tablet twice a day for bleeding. If needed, can take 2 tablets twice a day 05/31/15   Reva Bores, MD  nystatin ointment (MYCOSTATIN) Apply 1 application topically 2 (two) times daily. 05/31/15   Reva Bores, MD  prochlorperazine (COMPAZINE) 5 MG tablet Take 1-2 tablets (5-10 mg total) by mouth every 8 (eight) hours as needed for nausea or vomiting. 11/02/14   Smitty Cords, DO  SUMAtriptan (IMITREX) 100 MG tablet Take 1 tablet (100 mg total) by mouth once. May repeat in 2 hours if headache persists or recurs. 11/11/14   Anson Fret, MD    Family History Family History  Problem Relation Age of Onset  . Diabetes type II Father   . Hypertension Father     . Hypertension Mother   . Goiter Sister 60    identical twin  . Asthma Sister   . Hypertension Sister   . Stroke Maternal Grandfather   . Diabetes Paternal Grandfather   . Heart disease Paternal Grandfather     Social History Social History  Substance Use Topics  . Smoking status: Never Smoker  . Smokeless tobacco: Never Used  . Alcohol use No     Comment: Wine once a year      Allergies   Ivp dye [iodinated diagnostic agents]; Augmentin [amoxicillin-pot clavulanate]; Lisinopril; and Zofran [ondansetron hcl]   Review of Systems Review of Systems A complete 10 system review of systems was obtained and all systems are negative except as noted in the HPI and PMH.   Physical Exam Updated Vital Signs BP 144/93 (BP Location: Right Arm)   Pulse 83   Temp 98 F (36.7 C) (Oral)   Resp 18   LMP 12/10/2015 (Exact Date)   SpO2 100%   Physical Exam  Constitutional: She appears well-developed and well-nourished. No distress.  HENT:  Head: Normocephalic and atraumatic.  Mouth/Throat: Oropharynx is clear and moist. No oropharyngeal exudate.  Eyes: Conjunctivae are normal.  Neck: Neck supple.  Cardiovascular: Normal rate and regular rhythm.   Pulmonary/Chest: Effort normal. No respiratory distress. She has wheezes.  Minor wheezes in the bilateral lower lobes.   Musculoskeletal: Normal range of motion.  Neurological: She is alert.  Skin: Skin is warm and dry.  Psychiatric: She has a normal mood and affect. Her behavior is normal.  Nursing note and vitals reviewed.    ED Treatments / Results  DIAGNOSTIC STUDIES: Oxygen Saturation is 100% on RA, normal by my interpretation.    COORDINATION OF CARE: 4:44 PM- Pt advised of plan for treatment and pt agrees.  Labs (all labs ordered are listed, but only abnormal results are displayed) Labs Reviewed - No data to display  EKG  EKG Interpretation None       Radiology No results found.  Procedures Procedures  (including critical care time)  Medications Ordered in ED Medications  albuterol (PROVENTIL HFA;VENTOLIN HFA) 108 (90 Base) MCG/ACT inhaler 1-2 puff (1 puff Inhalation Given 12/24/15 1718)  ipratropium-albuterol (DUONEB) 0.5-2.5 (3) MG/3ML nebulizer solution 3 mL (3 mLs Nebulization Given 12/24/15 1652)     Initial Impression / Assessment and Plan / ED Course  I have reviewed the triage vital signs and the nursing notes.  Pertinent labs & imaging results that were available during my care of the patient were reviewed by me and considered in my medical decision making (see chart for details).  Clinical Course     Final Clinical Impressions(s) / ED Diagnoses   Final diagnoses:  Asthma exacerbation   Labs:  Imaging:  Consults:  Therapeutics: ipratropium-albuterol (DUONEB) 0.5-2.5 (3) MG/3ML nebulizer solution 3 mL    Discharge Meds:   Assessment/Plan:   40 year old female presents today with asthma exacerbation. Patient has no signs of infectious etiology, minor wheezes on exam. She was given a DuoNeb here which dramatically improved her symptoms, she has normal shortness of breath on exam. Patient was given an inhaler. ED instructed to use antihistamines, follow-up with primary care for reevaluation. She is given strict return questions, verbalized understanding and agreement to today's plan had no further questions or concerns     New Prescriptions New Prescriptions   No medications on file   I personally performed the services described in this documentation, which was scribed in my presence. The recorded information has been reviewed and is accurate.    Eyvonne Mechanic, PA-C 12/24/15 1722    Pricilla Loveless, MD 12/26/15 [phone]

## 2015-12-24 NOTE — Discharge Instructions (Signed)
Please read attached information. If you experience any new or worsening signs or symptoms please return to the emergency room for evaluation. Please follow-up with your primary care provider or specialist as discussed. Please use medication prescribed only as directed and discontinue taking if you have any concerning signs or symptoms.   °

## 2015-12-24 NOTE — ED Triage Notes (Signed)
Pt reports that her allergies have been "acting up" and when they do her asthma flares up. States that she does not have insurance and is unable to get her inhaler perscription refilled. Lung sounds clear with cough at triage.

## 2015-12-25 ENCOUNTER — Emergency Department
Admission: EM | Admit: 2015-12-25 | Discharge: 2015-12-25 | Disposition: A | Discharge status: Home / Self Care | Payer: Self-pay | Attending: Emergency Medicine | Admitting: Emergency Medicine

## 2015-12-25 ENCOUNTER — Emergency Department: Payer: Self-pay

## 2015-12-25 DIAGNOSIS — J45901 Unspecified asthma with (acute) exacerbation: Secondary | ICD-10-CM | POA: Insufficient documentation

## 2015-12-25 DIAGNOSIS — Z79899 Other long term (current) drug therapy: Secondary | ICD-10-CM | POA: Insufficient documentation

## 2015-12-25 DIAGNOSIS — I10 Essential (primary) hypertension: Secondary | ICD-10-CM | POA: Insufficient documentation

## 2015-12-25 DIAGNOSIS — J302 Other seasonal allergic rhinitis: Secondary | ICD-10-CM | POA: Insufficient documentation

## 2015-12-25 DIAGNOSIS — J3089 Other allergic rhinitis: Secondary | ICD-10-CM

## 2015-12-25 MED ORDER — IPRATROPIUM-ALBUTEROL 0.5-2.5 (3) MG/3ML IN SOLN
3.0000 mL | Freq: Once | RESPIRATORY_TRACT | Status: AC
  Administered 2015-12-25: 3 mL via RESPIRATORY_TRACT
  Filled 2015-12-25: qty 3

## 2015-12-25 MED ORDER — PREDNISONE 10 MG PO TABS
50.0000 mg | ORAL_TABLET | Freq: Once | ORAL | Status: AC
  Administered 2015-12-25: 50 mg via ORAL
  Filled 2015-12-25: qty 2

## 2015-12-25 MED ORDER — PREDNISONE 10 MG PO TABS: 50.0000 mg | ORAL_TABLET | Freq: Every day | ORAL | 0 refills | Status: DC

## 2015-12-25 NOTE — ED Triage Notes (Signed)
Pt states was seen at ed yesterday for shob. Pt states she was given an inhaler for "wheezing". Pt states this am she has not been able "breathe" and has been utilizing inhaler without relief of shob. Pt states she is not able to rest due to shob. Dry cough noted in triage. Pt with noted nasal congestion. Pt denies fever.

## 2015-12-25 NOTE — ED Notes (Signed)
Pt verbalized understanding of discharge instructions. NAD at this time. 

## 2015-12-25 NOTE — ED Provider Notes (Signed)
[facility] Emergency Department Provider Note ____________________________________________   I have reviewed the triage vital signs and the triage nursing note.  HISTORY  Chief Complaint Shortness of Breath   Historian Patient  HPI Mary Hanson is a 40 y.o. female with a history of seasonal and environmental allergies and a history of asthma, who states for the past few days she's had increased wheezing and trouble breathing consistent with prior asthma exacerbations. She was seen at [facility] long yesterday because she was having symptoms of asthma and did not have albuterol. She received a DuoNeb treatment and felt much better and was discharged home with an albuterol inhaler in hand.  Patient states that overnight the wheezing came back despite using her inhaler. She is also complaining of a lot of sinus congestion and nasal drainage, without fever or chest pain.    Past Medical History:  Diagnosis Date  . Allergy-induced asthma    prn inhaler  . History of anemia    no current problem, per pt.  . Hypertension    under control with med., has been on med. x 1 yr.  . Migraines   . Pyogenic granuloma 12/2013   right ring finger  . Seasonal allergies     Patient Active Problem List   Diagnosis Date Noted  . Menorrhagia 05/31/2015  . Peripheral neuralgia 11/11/2014  . Enlarged thyroid 04/03/2013  . Unspecified vitamin D deficiency 07/29/2012  . External hemorrhoid 09/06/2011  . Hypertension 12/29/2010  . Morbid obesity (HCC) 12/29/2010  . Asthma 12/29/2010  . Allergic rhinitis 12/29/2010  . Migraine headache 12/29/2010    Past Surgical History:  Procedure Laterality Date  . CERVICAL CONE BIOPSY  age 17  . CHOLECYSTECTOMY  2006 or 2007  . DILATION AND CURETTAGE OF UTERUS    . DILATION AND EVACUATION  age 22  . MASS EXCISION Right 01/16/2014   Procedure: EXCISION PYOGENIC GRANULOMA FROM RIGHT RING FINGER;  Surgeon: Dairl Ponder, MD;   Location:  SURGERY CENTER;  Service: Orthopedics;  Laterality: Right;    Prior to Admission medications   Medication Sig Start Date End Date Taking? Authorizing Provider  albuterol (PROVENTIL HFA;VENTOLIN HFA) 108 (90 BASE) MCG/ACT inhaler Inhale 2 puffs into the lungs every 4 (four) hours as needed for wheezing or shortness of breath. 03/01/15   Ardith Dark, MD  cetirizine (ZYRTEC) 10 MG tablet Take 10 mg by mouth.    Historical Provider, MD  diazepam (VALIUM) 5 MG tablet Take 1 tablet (5 mg total) by mouth every 6 (six) hours as needed for anxiety (spasms). 09/20/15   Melene Plan, DO  eletriptan (RELPAX) 40 MG tablet Take 1 tablet (40 mg total) by mouth as needed for migraine or headache. May repeat in 2 hours if headache persists or recurs. 11/15/14   Anson Fret, MD  hydrochlorothiazide (HYDRODIURIL) 25 MG tablet Take 1 tablet (25 mg total) by mouth daily. 05/31/15   Reva Bores, MD  ibuprofen (ADVIL,MOTRIN) 200 MG tablet Take 800 mg by mouth every 6 (six) hours as needed for headache.    Historical Provider, MD  megestrol (MEGACE) 40 MG tablet Take 1 tablet (40 mg total) by mouth 2 (two) times daily. Take 1 tablet twice a day for bleeding. If needed, can take 2 tablets twice a day 05/31/15   Reva Bores, MD  nystatin ointment (MYCOSTATIN) Apply 1 application topically 2 (two) times daily. 05/31/15   Reva Bores, MD  predniSONE (DELTASONE) 10 MG tablet  Take 5 tablets (50 mg total) by mouth daily. 12/25/15   Governor Rooks, MD  prochlorperazine (COMPAZINE) 5 MG tablet Take 1-2 tablets (5-10 mg total) by mouth every 8 (eight) hours as needed for nausea or vomiting. 11/02/14   Smitty Cords, DO  SUMAtriptan (IMITREX) 100 MG tablet Take 1 tablet (100 mg total) by mouth once. May repeat in 2 hours if headache persists or recurs. 11/11/14   Anson Fret, MD    Allergies  Allergen Reactions  . Ivp Dye [Iodinated Diagnostic Agents] Nausea And Vomiting  . Augmentin [Amoxicillin-Pot  Clavulanate] Nausea And Vomiting  . Lisinopril Swelling    Lip swelling/Angioedema  . Zofran [Ondansetron Hcl] Other (See Comments)    Severe headache    Family History  Problem Relation Age of Onset  . Diabetes type II Father   . Hypertension Father   . Hypertension Mother   . Goiter Sister 35    identical twin  . Asthma Sister   . Hypertension Sister   . Stroke Maternal Grandfather   . Diabetes Paternal Grandfather   . Heart disease Paternal Grandfather     Social History Social History  Substance Use Topics  . Smoking status: Never Smoker  . Smokeless tobacco: Never Used  . Alcohol use No     Comment: Wine once a year     Review of Systems  Constitutional: Negative for fever. Eyes: Negative for visual changes. ENT: Negative for sore throat. Cardiovascular: Negative for chest pain. Respiratory: Positive for shortness of breath. Gastrointestinal: Negative for abdominal pain, vomiting and diarrhea. Genitourinary: Negative for dysuria. Musculoskeletal: Negative for back pain. Skin: Negative for rash. Neurological: Positive for mild generalized headache especially frontal. 10 point Review of Systems otherwise negative ____________________________________________   PHYSICAL EXAM:  VITAL SIGNS: ED Triage Vitals  Enc Vitals Group     BP 12/25/15 0507 (!) 121/59     Pulse Rate 12/25/15 0507 75     Resp 12/25/15 0507 20     Temp 12/25/15 0507 97.8 F (36.6 C)     Temp Source 12/25/15 0507 Oral     SpO2 12/25/15 0507 100 %     Weight 12/25/15 0508 (!) 306 lb (138.8 kg)     Height 12/25/15 0508 5\' 6"  (1.676 m)     Head Circumference --      Peak Flow --      Pain Score 12/25/15 0508 6     Pain Loc --      Pain Edu? --      Excl. in GC? --      Constitutional: Alert and oriented. Well appearing and in no distress. HEENT   Head: Normocephalic and atraumatic.      Eyes: Conjunctivae are normal. PERRL. Normal extraocular movements.      Ears:          Nose: Nasal congestion without obvious rhinorrhea   Mouth/Throat: Mucous membranes are moist.   Neck: No stridor. Cardiovascular/Chest: Normal rate, regular rhythm.  No murmurs, rubs, or gallops. Respiratory: Normal respiratory effort without tachypnea nor retractions. Mild to moderate and expiratory wheezing in posterior fields bilaterally.  Gastrointestinal: Soft. No distention, no guarding, no rebound. Nontender.    Genitourinary/rectal:Deferred Musculoskeletal: Nontender with normal range of motion in all extremities. No joint effusions.  No lower extremity tenderness.  Trace lower extremity edema bilaterally. Neurologic:  Normal speech and language. No gross or focal neurologic deficits are appreciated. Skin:  Skin is warm, dry and intact. No rash  noted. Psychiatric: Mood and affect are normal. Speech and behavior are normal. Patient exhibits appropriate insight and judgment.  ____________________________________________   EKG I, Governor Rooksebecca Anastasio Wogan, MD, the attending physician have personally viewed and interpreted all ECGs.  70 bpm. Normal sinus rhythm. Narrow QRS. Normal axis. Normal ST and T-wave ____________________________________________  LABS (pertinent positives/negatives)  Labs Reviewed - No data to display  ____________________________________________  RADIOLOGY All Xrays were viewed by me. Imaging interpreted by Radiologist.  Chest x-ray two-view:IMPRESSION: 1. Mild scattered peribronchial thickening, which may be related to patient's history of asthma and/or bronchiolitis. 2. No other active cardiopulmonary disease identified. __________________________________________  PROCEDURES  Procedure(s) performed: None  Critical Care performed: None  ____________________________________________   ED COURSE / ASSESSMENT AND PLAN  Pertinent labs & imaging results that were available during my care of the patient were reviewed by me and considered in my medical  decision making (see chart for details).   Ms. Glenetta HewMcLaurin is well appearing overall but she does have posterior wheezing. I think she is having an asthma exacerbation brought on by viral URI versus environmental/seasonal allergies. She has albuterol, she is not hypoxic. No high risk features, I am going to give her a DuoNeb treatment here and start her on a prednisone burst.  She is complaining of a mild frontal generalized headache which I suspect is sinus congestion related. We discussed return precautions with respect to that as well.    CONSULTATIONS:   None   Patient / Family / Caregiver informed of clinical course, medical decision-making process, and agree with plan.   I discussed return precautions, follow-up instructions, and discharged instructions with patient and/or family.   ___________________________________________   FINAL CLINICAL IMPRESSION(S) / ED DIAGNOSES   Final diagnoses:  Asthma exacerbation  Environmental and seasonal allergies              Note: This dictation was prepared with Dragon dictation. Any transcriptional errors that result from this process are unintentional    Governor Rooksebecca Edris Friedt, MD 12/25/15 [phone]

## 2015-12-25 NOTE — Discharge Instructions (Signed)
You're being treated for allergies/upper respiratory infection with asthma exacerbation, and I am adding prednisone steroid for the next 5 days. Continue to use your albuterol inhaler 2 puffs every 4 hours as needed for wheezing or trouble breathing.  Return to the emergency department for any worsening trouble breathing, chest pain, fever, confusion or altered mental status, or any other symptoms concerning to you.  As we discussed, you may continue Allegra, you may consider a decongestant for 2 or 3 days, continue Mucinex, and stay well hydrated.

## 2015-12-31 ENCOUNTER — Encounter: Payer: Self-pay | Admitting: Internal Medicine

## 2015-12-31 ENCOUNTER — Ambulatory Visit (INDEPENDENT_AMBULATORY_CARE_PROVIDER_SITE_OTHER): Payer: Self-pay | Admitting: Internal Medicine

## 2015-12-31 DIAGNOSIS — R0602 Shortness of breath: Secondary | ICD-10-CM

## 2015-12-31 MED ORDER — HYDROCODONE-HOMATROPINE 5-1.5 MG/5ML PO SYRP: 5.0000 mL | ORAL_SOLUTION | Freq: Three times a day (TID) | ORAL | 0 refills | Status: DC | PRN

## 2015-12-31 MED ORDER — AMOXICILLIN 500 MG PO CAPS: 1000.0000 mg | ORAL_CAPSULE | Freq: Three times a day (TID) | ORAL | 0 refills | Status: DC

## 2015-12-31 MED ORDER — ALBUTEROL SULFATE HFA 108 (90 BASE) MCG/ACT IN AERS
2.0000 | INHALATION_SPRAY | RESPIRATORY_TRACT | 0 refills | Status: DC | PRN
Start: 2015-12-31 — End: 2016-05-25

## 2015-12-31 MED FILL — AMOXICILLIN 500 MG CAPSULE: 500 | 10 days supply | Qty: 30 | Fill #0

## 2015-12-31 MED FILL — predniSONE 10 MG TABS: 10 | 4 days supply | Qty: 20 | Fill #0

## 2015-12-31 NOTE — Patient Instructions (Signed)
It was nice meeting you today Ms. Smither!  For your cough, you can take the hycodan syrup up to every 8 hours as needed. You can either begin taking the antibiotic (amoxicillin) today, or wait a couple days to see if your symptoms improve. If you do decide to take the antibiotic, take two pills (1000 mg) every 8 hours for the next 5 days. I have also written a refill for your albuterol inhaler. You can continue to use this as needed like you have been.   If you have any questions or concerns, please feel free to call the clinic.   Be well,  Dr. Natale MilchLancaster

## 2015-12-31 NOTE — Assessment & Plan Note (Signed)
Present for 7 days and worsening. Initially seemed due to asthma exacerbation, but has not improved with continued albuterol use and prednisone burst. No improvement with OTC products. Likely viral, however given worsening respiratory status despite steroids, will treat for bronchitis.  - Amoxicillin 1g a8 x5d - Hycodan q8 PRN cough

## 2015-12-31 NOTE — Progress Notes (Signed)
   Subjective:    Patient ID: Mary Hanson, female    DOB: 08-19-1975, 40 y.o.   MRN: [id number]  HPI  Patient presents with shortness of breath.   Patient reports worsening shortness of breath for the past 7 days. She was seen at [facility] when symptoms started, and was told to continue using her albuterol inhaler PRN and begin taking Zyrtec. Her symptoms did not improve, so she was seen at [facility] ED the following day. There she was given a Duoneb treatment, and started on a 5d prednisone burst. Patient reports her symptoms improved somewhat with prednisone, but have started to worsen again in the past two days. She has also been taking Mucinex every 4 hours for the next 5 days, and drinking peppermint tea. She reports Mucinex helps for a couple hours,t hen her symptoms return. Also reports coughing that is keeping her up at night. Was using Tessalon perles left over from previous cough with some relief, however for the past two days these have stopped helping. Albuterol no longer helps with shortness of breath.  Denies fevers, chills. Endorses nausea two days ago but no vomiting. Initially had sore throat at symptom onset but this has since resolved with throat lozenges.   Review of Systems See HPI.     Objective:   Physical Exam  Constitutional: She is oriented to person, place, and time. She appears well-developed and well-nourished. No distress.  HENT:  Head: Normocephalic and atraumatic.  Cardiovascular: Normal rate, regular rhythm and normal heart sounds.   No murmur heard. Pulmonary/Chest: Effort normal and breath sounds normal. No respiratory distress. She has no wheezes. She has no rales.  Neurological: She is alert and oriented to person, place, and time.  Psychiatric: She has a normal mood and affect. Her behavior is normal.      Assessment & Plan:  Shortness of breath Present for 7 days and worsening. Initially seemed due to asthma exacerbation, but has not improved  with continued albuterol use and prednisone burst. No improvement with OTC products. Likely viral, however given worsening respiratory status despite steroids, will treat for bronchitis.  - Amoxicillin 1g a8 x5d - Hycodan q8 PRN cough  Tarri AbernethyAbigail J Erez Mccallum, MD, MPH PGY-2 [facility] Pager [phone]

## 2016-01-11 ENCOUNTER — Inpatient Hospital Stay (HOSPITAL_COMMUNITY)
Admission: AD | Admit: 2016-01-11 | Discharge: 2016-01-11 | Disposition: A | Discharge status: Home / Self Care | Payer: Self-pay | Source: Ambulatory Visit | Attending: Family Medicine | Admitting: Family Medicine

## 2016-01-11 ENCOUNTER — Encounter (HOSPITAL_COMMUNITY): Payer: Self-pay | Admitting: *Deleted

## 2016-01-11 DIAGNOSIS — J45909 Unspecified asthma, uncomplicated: Secondary | ICD-10-CM | POA: Insufficient documentation

## 2016-01-11 DIAGNOSIS — I10 Essential (primary) hypertension: Secondary | ICD-10-CM | POA: Insufficient documentation

## 2016-01-11 DIAGNOSIS — Z880 Allergy status to penicillin: Secondary | ICD-10-CM | POA: Insufficient documentation

## 2016-01-11 DIAGNOSIS — N946 Dysmenorrhea, unspecified: Secondary | ICD-10-CM | POA: Insufficient documentation

## 2016-01-11 DIAGNOSIS — Z3202 Encounter for pregnancy test, result negative: Secondary | ICD-10-CM | POA: Insufficient documentation

## 2016-01-11 LAB — WET PREP, GENITAL
Clue Cells Wet Prep HPF POC: NONE SEEN
Sperm: NONE SEEN
Trich, Wet Prep: NONE SEEN
YEAST WET PREP: NONE SEEN

## 2016-01-11 LAB — URINALYSIS, ROUTINE W REFLEX MICROSCOPIC
BILIRUBIN URINE: NEGATIVE
Glucose, UA: NEGATIVE mg/dL
Ketones, ur: NEGATIVE mg/dL
Leukocytes, UA: NEGATIVE
NITRITE: NEGATIVE
Protein, ur: NEGATIVE mg/dL
SPECIFIC GRAVITY, URINE: 1.02 (ref 1.005–1.030)
pH: 6 (ref 5.0–8.0)

## 2016-01-11 LAB — POCT PREGNANCY, URINE: PREG TEST UR: NEGATIVE

## 2016-01-11 LAB — URINE MICROSCOPIC-ADD ON

## 2016-01-11 LAB — GC/CHLAMYDIA PROBE AMP (~~LOC~~) NOT AT ARMC
Chlamydia: NEGATIVE
Neisseria Gonorrhea: NEGATIVE

## 2016-01-11 MED ORDER — KETOROLAC TROMETHAMINE 60 MG/2ML IM SOLN
60.0000 mg | Freq: Once | INTRAMUSCULAR | Status: AC
  Administered 2016-01-11: 60 mg via INTRAMUSCULAR
  Filled 2016-01-11: qty 2

## 2016-01-11 NOTE — Discharge Instructions (Signed)

## 2016-01-11 NOTE — MAU Note (Signed)
Patient presents to mau with c/o lower abdominal pain and vaginal itching. States her period started a couple of days ago and nothing has relieved the cramping; tried motrin and midol around midnight and 330 am. Also states that the vaginal itching started after completing her antibiotic for a sinus infection.

## 2016-01-11 NOTE — MAU Provider Note (Signed)
History     CSN: [id number]  Arrival date and time: 01/11/16 [id number]   First Provider Initiated Contact with Patient 01/11/16 [phone]      Chief Complaint  Patient presents with  . Pelvic Pain   Pelvic Pain  The patient's primary symptoms include pelvic pain and vaginal bleeding. This is a new problem. The current episode started yesterday. The problem occurs constantly. The problem has been unchanged. The pain is severe. The problem affects both sides. She is not pregnant. Associated symptoms include abdominal pain. Pertinent negatives include no chills, constipation, diarrhea, dysuria, fever, frequency, nausea, urgency or vomiting. The vaginal discharge was bloody. The vaginal bleeding is typical of menses. She has not been passing clots. She has not been passing tissue. Nothing aggravates the symptoms. She has tried NSAIDs and oral narcotics for the symptoms. The treatment provided no relief. Her menstrual history has been regular (LMP 01/09/16).    Past Medical History:  Diagnosis Date  . Allergy-induced asthma    prn inhaler  . History of anemia    no current problem, per pt.  . Hypertension    under control with med., has been on med. x 1 yr.  . Migraines   . Pyogenic granuloma 12/2013   right ring finger  . Seasonal allergies     Past Surgical History:  Procedure Laterality Date  . CERVICAL CONE BIOPSY  age 40  . CHOLECYSTECTOMY  2006 or 2007  . DILATION AND CURETTAGE OF UTERUS    . DILATION AND EVACUATION  age 40  . MASS EXCISION Right 01/16/2014   Procedure: EXCISION PYOGENIC GRANULOMA FROM RIGHT RING FINGER;  Surgeon: Dairl PonderMatthew Weingold, MD;  Location: [facility];  Service: Orthopedics;  Laterality: Right;    Family History  Problem Relation Age of Onset  . Diabetes type II Father   . Hypertension Father   . Hypertension Mother   . Goiter Sister [phone]    identical twin  . Asthma Sister   . Hypertension Sister   . Stroke Maternal Grandfather   . Diabetes  Paternal Grandfather   . Heart disease Paternal Grandfather     Social History  Substance Use Topics  . Smoking status: Never Smoker  . Smokeless tobacco: Never Used  . Alcohol use No     Comment: Wine once a year     Allergies:  Allergies  Allergen Reactions  . Ivp Dye [Iodinated Diagnostic Agents] Nausea And Vomiting  . Augmentin [Amoxicillin-Pot Clavulanate] Nausea And Vomiting  . Lisinopril Swelling    Lip swelling/Angioedema  . Zofran [Ondansetron Hcl] Other (See Comments)    Severe headache    Prescriptions Prior to Admission  Medication Sig Dispense Refill Last Dose  . albuterol (PROVENTIL HFA;VENTOLIN HFA) 108 (90 Base) MCG/ACT inhaler Inhale 2 puffs into the lungs every 4 (four) hours as needed for wheezing or shortness of breath. 1 Inhaler 0   . amoxicillin (AMOXIL) 500 MG capsule Take 2 capsules (1,000 mg total) by mouth 3 (three) times daily. 30 capsule 0   . cetirizine (ZYRTEC) 10 MG tablet Take 10 mg by mouth.   More than a month at Unknown time  . diazepam (VALIUM) 5 MG tablet Take 1 tablet (5 mg total) by mouth every 6 (six) hours as needed for anxiety (spasms). 10 tablet 0   . eletriptan (RELPAX) 40 MG tablet Take 1 tablet (40 mg total) by mouth as needed for migraine or headache. May repeat in 2 hours if headache persists or  recurs. 10 tablet 0 More than a month at Unknown time  . hydrochlorothiazide (HYDRODIURIL) 25 MG tablet Take 1 tablet (25 mg total) by mouth daily. 60 tablet 5   . HYDROcodone-homatropine (HYCODAN) 5-1.5 MG/5ML syrup Take 5 mLs by mouth every 8 (eight) hours as needed for cough. 120 mL 0   . ibuprofen (ADVIL,MOTRIN) 200 MG tablet Take 800 mg by mouth every 6 (six) hours as needed for headache.   03/06/2015 at Unknown time  . megestrol (MEGACE) 40 MG tablet Take 1 tablet (40 mg total) by mouth 2 (two) times daily. Take 1 tablet twice a day for bleeding. If needed, can take 2 tablets twice a day 60 tablet 3   . nystatin ointment (MYCOSTATIN)  Apply 1 application topically 2 (two) times daily. 30 g 0   . predniSONE (DELTASONE) 10 MG tablet Take 5 tablets (50 mg total) by mouth daily. 20 tablet 0   . prochlorperazine (COMPAZINE) 5 MG tablet Take 1-2 tablets (5-10 mg total) by mouth every 8 (eight) hours as needed for nausea or vomiting. 30 tablet 0 Past Month at Unknown time  . SUMAtriptan (IMITREX) 100 MG tablet Take 1 tablet (100 mg total) by mouth once. May repeat in 2 hours if headache persists or recurs. 10 tablet 11 Past Week at Unknown time    Review of Systems  Constitutional: Negative for chills and fever.  Gastrointestinal: Positive for abdominal pain. Negative for constipation, diarrhea, nausea and vomiting.  Genitourinary: Positive for pelvic pain. Negative for dysuria, frequency and urgency.   Physical Exam   Blood pressure 142/74, pulse 69, temperature 97.7 F (36.5 C), temperature source Oral, resp. rate 20, weight (!) 305 lb 1.9 oz (138.4 kg), last menstrual period 12/07/2015, SpO2 99 %.  Physical Exam  Nursing note and vitals reviewed. Constitutional: She is oriented to person, place, and time. She appears well-developed and well-nourished. No distress.  HENT:  Head: Normocephalic.  Cardiovascular: Normal rate.   Respiratory: Effort normal.  GI: Soft. There is no tenderness. There is no rebound.  Neurological: She is alert and oriented to person, place, and time.  Skin: Skin is warm and dry.  Psychiatric: She has a normal mood and affect.    Results for orders placed or performed during the hospital encounter of 01/11/16 (from the past 24 hour(s))  Urinalysis, Routine w reflex microscopic (not at [facility])     Status: Abnormal   Collection Time: 01/11/16  5:08 AM  Result Value Ref Range   Color, Urine YELLOW YELLOW   APPearance CLEAR CLEAR   Specific Gravity, Urine 1.020 1.005 - 1.030   pH 6.0 5.0 - 8.0   Glucose, UA NEGATIVE NEGATIVE mg/dL   Hgb urine dipstick LARGE (A) NEGATIVE   Bilirubin Urine  NEGATIVE NEGATIVE   Ketones, ur NEGATIVE NEGATIVE mg/dL   Protein, ur NEGATIVE NEGATIVE mg/dL   Nitrite NEGATIVE NEGATIVE   Leukocytes, UA NEGATIVE NEGATIVE  Urine microscopic-add on     Status: Abnormal   Collection Time: 01/11/16  5:08 AM  Result Value Ref Range   Squamous Epithelial / LPF 0-5 (A) NONE SEEN   WBC, UA 0-5 0 - 5 WBC/hpf   RBC / HPF TOO NUMEROUS TO COUNT 0 - 5 RBC/hpf   Bacteria, UA FEW (A) NONE SEEN   Urine-Other MUCOUS PRESENT   Pregnancy, urine POC     Status: None   Collection Time: 01/11/16  5:17 AM  Result Value Ref Range   Preg Test, Ur NEGATIVE NEGATIVE  Wet prep, genital     Status: Abnormal   Collection Time: 01/11/16  5:26 AM  Result Value Ref Range   Yeast Wet Prep HPF POC NONE SEEN NONE SEEN   Trich, Wet Prep NONE SEEN NONE SEEN   Clue Cells Wet Prep HPF POC NONE SEEN NONE SEEN   WBC, Wet Prep HPF POC FEW (A) NONE SEEN   Sperm NONE SEEN     MAU Course  Procedures  MDM Patient has had toradol and she reports that her pain is better now.   Assessment and Plan   1. Dysmenorrhea    DC home Comfort measures reviewed  RX: none Outpatient pelvic US order   Return to MAU as needed   Follow-up Information    Reva Bores, MD .   Specialty:  Obstetrics and Gynecology Contact information: [address] [place] [address] [phone]            Tawnya Crook 01/11/2016, 5:52 AM

## 2016-01-16 ENCOUNTER — Emergency Department (HOSPITAL_COMMUNITY)
Admission: EM | Admit: 2016-01-16 | Discharge: 2016-01-16 | Disposition: A | Discharge status: Home / Self Care | Payer: No Typology Code available for payment source | Attending: Emergency Medicine | Admitting: Emergency Medicine

## 2016-01-16 ENCOUNTER — Emergency Department (HOSPITAL_COMMUNITY): Payer: No Typology Code available for payment source

## 2016-01-16 ENCOUNTER — Encounter (HOSPITAL_COMMUNITY): Payer: Self-pay | Admitting: Emergency Medicine

## 2016-01-16 DIAGNOSIS — I10 Essential (primary) hypertension: Secondary | ICD-10-CM | POA: Diagnosis not present

## 2016-01-16 DIAGNOSIS — J45909 Unspecified asthma, uncomplicated: Secondary | ICD-10-CM | POA: Diagnosis not present

## 2016-01-16 DIAGNOSIS — Z7951 Long term (current) use of inhaled steroids: Secondary | ICD-10-CM | POA: Diagnosis not present

## 2016-01-16 DIAGNOSIS — Y999 Unspecified external cause status: Secondary | ICD-10-CM | POA: Insufficient documentation

## 2016-01-16 DIAGNOSIS — Z79899 Other long term (current) drug therapy: Secondary | ICD-10-CM | POA: Diagnosis not present

## 2016-01-16 DIAGNOSIS — Y9389 Activity, other specified: Secondary | ICD-10-CM | POA: Insufficient documentation

## 2016-01-16 DIAGNOSIS — Y92410 Unspecified street and highway as the place of occurrence of the external cause: Secondary | ICD-10-CM | POA: Insufficient documentation

## 2016-01-16 DIAGNOSIS — R51 Headache: Secondary | ICD-10-CM | POA: Insufficient documentation

## 2016-01-16 DIAGNOSIS — R1011 Right upper quadrant pain: Secondary | ICD-10-CM | POA: Diagnosis not present

## 2016-01-16 LAB — CBC WITH DIFFERENTIAL/PLATELET
Basophils Absolute: 0 10*3/uL (ref 0.0–0.1)
Basophils Relative: 0 %
EOS PCT: 4 %
Eosinophils Absolute: 0.3 10*3/uL (ref 0.0–0.7)
HCT: 39.6 % (ref 36.0–46.0)
Hemoglobin: 12.4 g/dL (ref 12.0–15.0)
LYMPHS ABS: 2.4 10*3/uL (ref 0.7–4.0)
LYMPHS PCT: 28 %
MCH: 27 pg (ref 26.0–34.0)
MCHC: 31.3 g/dL (ref 30.0–36.0)
MCV: 86.3 fL (ref 78.0–100.0)
MONO ABS: 0.5 10*3/uL (ref 0.1–1.0)
Monocytes Relative: 6 %
Neutro Abs: 5.1 10*3/uL (ref 1.7–7.7)
Neutrophils Relative %: 62 %
PLATELETS: 309 10*3/uL (ref 150–400)
RBC: 4.59 MIL/uL (ref 3.87–5.11)
RDW: 15.2 % (ref 11.5–15.5)
WBC: 8.3 10*3/uL (ref 4.0–10.5)

## 2016-01-16 LAB — COMPREHENSIVE METABOLIC PANEL
ALT: 18 U/L (ref 14–54)
AST: 14 U/L — AB (ref 15–41)
Albumin: 3.7 g/dL (ref 3.5–5.0)
Alkaline Phosphatase: 90 U/L (ref 38–126)
Anion gap: 7 (ref 5–15)
BUN: 8 mg/dL (ref 6–20)
CHLORIDE: 104 mmol/L (ref 101–111)
CO2: 29 mmol/L (ref 22–32)
CREATININE: 0.72 mg/dL (ref 0.44–1.00)
Calcium: 8.9 mg/dL (ref 8.9–10.3)
GFR calc Af Amer: >60 mL/min (ref >60)
GFR calc non Af Amer: >60 mL/min (ref >60)
Glucose, Bld: 103 mg/dL — ABNORMAL HIGH (ref 65–99)
Potassium: 3.1 mmol/L — ABNORMAL LOW (ref 3.5–5.1)
SODIUM: 140 mmol/L (ref 135–145)
Total Bilirubin: 0.6 mg/dL (ref 0.3–1.2)
Total Protein: 7.6 g/dL (ref 6.5–8.1)

## 2016-01-16 LAB — I-STAT BETA HCG BLOOD, ED (MC, WL, AP ONLY)
I-stat hCG, quantitative: <5 m[IU]/mL
I-stat hCG, quantitative: <5 m[IU]/mL (ref <5)

## 2016-01-16 LAB — LIPASE, BLOOD: Lipase: 19 U/L (ref 11–51)

## 2016-01-16 MED ORDER — IBUPROFEN 200 MG PO TABS
600.0000 mg | ORAL_TABLET | Freq: Once | ORAL | Status: AC
  Administered 2016-01-16: 600 mg via ORAL
  Filled 2016-01-16: qty 3

## 2016-01-16 MED ORDER — CYCLOBENZAPRINE HCL 5 MG PO TABS: 10.0000 mg | ORAL_TABLET | Freq: Three times a day (TID) | ORAL | 0 refills | Status: DC | PRN

## 2016-01-16 MED ORDER — IOPAMIDOL (ISOVUE-300) INJECTION 61%
100.0000 mL | Freq: Once | INTRAVENOUS | Status: AC | PRN
  Administered 2016-01-16: 100 mL via INTRAVENOUS

## 2016-01-16 MED ORDER — NAPROXEN 500 MG PO TABS: 500.0000 mg | ORAL_TABLET | Freq: Two times a day (BID) | ORAL | 0 refills | Status: DC
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
## 2016-01-16 NOTE — Discharge Instructions (Signed)
Read the information below.  Your chest scan showed "ground glass opacities" in the right lower lung lobe. This may be a lung bruise. It is important that you take deep breaths throughout the day. Furthermore, your heart is mildly enlarged. It is recommended that you have follow up imaging in three months to ensure the lung changes have resolved. You may have additional muscle soreness for the next 2-3 days. You can apply heat or ice to affected areas. Warm showers may ease aching muscles.  I have prescribed naprosyn and flexeril. While taking naprosyn do not take motrin, ibuprofen, or aleve. Flexeril can make you drowsy, do not drive after taking.  Please follow up with your primary doctor later this week for re-evaluation.  Use the prescribed medication as directed.  Please discuss all new medications with your pharmacist.   You may return to the Emergency Department at any time for worsening condition or any new symptoms that concern you.
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
## 2016-01-16 NOTE — ED Notes (Signed)
Pt ambulated in the hallway with a pulse ox.  Pt maintain a oxygen saturation of 100%.  Pt had a steady gait and denied SOB.  [place], PA made aware.

## 2016-02-10 ENCOUNTER — Ambulatory Visit (INDEPENDENT_AMBULATORY_CARE_PROVIDER_SITE_OTHER): Payer: Self-pay | Admitting: Student

## 2016-02-10 ENCOUNTER — Encounter: Payer: Self-pay | Admitting: Student

## 2016-02-10 VITALS — BP 144/86 | HR 77 | Temp 98.2°F | Wt 307.0 lb

## 2016-02-10 DIAGNOSIS — R5383 Other fatigue: Secondary | ICD-10-CM | POA: Insufficient documentation

## 2016-02-10 DIAGNOSIS — R911 Solitary pulmonary nodule: Secondary | ICD-10-CM | POA: Insufficient documentation

## 2016-02-10 LAB — POCT URINE PREGNANCY: PREG TEST UR: NEGATIVE

## 2016-02-10 LAB — CBC
HCT: 38.6 % (ref 35.0–45.0)
Hemoglobin: 12.4 g/dL (ref 11.7–15.5)
MCH: 26.8 pg — ABNORMAL LOW (ref 27.0–33.0)
MCHC: 32.1 g/dL (ref 32.0–36.0)
MCV: 83.5 fL (ref 80.0–100.0)
MPV: 9 fL (ref 7.5–12.5)
Platelets: 343 10*3/uL (ref 140–400)
RBC: 4.62 MIL/uL (ref 3.80–5.10)
RDW: 15 % (ref 11.0–15.0)
WBC: 8.9 10*3/uL (ref 3.8–10.8)

## 2016-02-10 LAB — TSH: TSH: 1.53 m[IU]/L
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
## 2016-02-10 NOTE — Patient Instructions (Signed)
Follow up in 2 weeks If you have worsening shortness of breath, call the office to be sen sooner We discussed a small lung finding you had on CT, we will repeat your CT in 2 months to further evaluate this If you have questions or concerns, call the office at [phone]
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
## 2016-02-10 NOTE — Progress Notes (Signed)
   Subjective:    Patient ID: Mary GrinderGaye Paladino, female    DOB: 1975-07-24, 40 y.o.   MRN: [id number]   CC:  HPI:   Smoking status reviewed  Review of Systems  Per HPI, else denies recent illness, fever, headache, changes in vision, chest pain, shortness of breath, abdominal pain, N/V/D, weakness    Objective:  BP (!) 144/86   Pulse 77   Temp 98.2 F (36.8 C) (Oral)   Wt (!) 307 lb (139.3 kg)   LMP 01/07/2016   SpO2 98%   BMI 49.55 kg/m  Vitals and nursing note reviewed  General: NAD Cardiac: RRR, normal heart sounds, no murmurs. 2+ radial and PT pulses bilaterally Respiratory: CTAB, normal effort Abdomen: soft, nontender, nondistended, no hepatic or splenomegaly. Bowel sounds present Extremities: no edema or cyanosis. WWP. Skin: warm and dry, no rashes noted Neuro: alert and oriented, no focal deficits   Assessment & Plan:    No problem-specific Assessment & Plan notes found for this encounter.    Nadyne Gariepy A. Kennon RoundsHaney MD, MS Family Medicine Resident PGY-3 Pager [phone]

## 2016-02-10 NOTE — Assessment & Plan Note (Signed)
Groundglass pulmonary opacity noted on 01/16/2016. Repeat CT chest was recommended in 3 months from that date - Will repeat CT in 2 months from today to further evaluate - This has been discussed with the patient who has stated her agreement and acceptance of this plan

## 2016-02-21 ENCOUNTER — Ambulatory Visit (HOSPITAL_COMMUNITY)
Admission: RE | Admit: 2016-02-21 | Discharge: 2016-02-21 | Disposition: A | Discharge status: Home / Self Care | Payer: Self-pay | Source: Ambulatory Visit | Attending: Advanced Practice Midwife | Admitting: Advanced Practice Midwife

## 2016-02-21 DIAGNOSIS — N946 Dysmenorrhea, unspecified: Secondary | ICD-10-CM | POA: Insufficient documentation

## 2016-02-21 DIAGNOSIS — R102 Pelvic and perineal pain: Secondary | ICD-10-CM | POA: Insufficient documentation

## 2016-02-21 DIAGNOSIS — N939 Abnormal uterine and vaginal bleeding, unspecified: Secondary | ICD-10-CM | POA: Insufficient documentation

## 2016-04-12 MED FILL — HYDROCHLOROTHIAZIDE 25 MG T: 25 | 90 days supply | Qty: 90 | Fill #2

## 2016-05-25 ENCOUNTER — Ambulatory Visit (INDEPENDENT_AMBULATORY_CARE_PROVIDER_SITE_OTHER): Payer: PRIVATE HEALTH INSURANCE | Admitting: Family Medicine

## 2016-05-25 ENCOUNTER — Encounter: Payer: Self-pay | Admitting: Family Medicine

## 2016-05-25 DIAGNOSIS — J45901 Unspecified asthma with (acute) exacerbation: Secondary | ICD-10-CM | POA: Diagnosis not present

## 2016-05-25 MED ORDER — ALBUTEROL SULFATE HFA 108 (90 BASE) MCG/ACT IN AERS: 2.0000 | INHALATION_SPRAY | RESPIRATORY_TRACT | 0 refills | Status: DC | PRN

## 2016-05-25 MED ORDER — BUDESONIDE-FORMOTEROL FUMARATE 80-4.5 MCG/ACT IN AERO
2.0000 | INHALATION_SPRAY | Freq: Two times a day (BID) | RESPIRATORY_TRACT | 3 refills | Status: DC
Start: 2016-05-25 — End: 2017-01-12

## 2016-05-25 MED ORDER — PREDNISONE 50 MG PO TABS: 50.0000 mg | ORAL_TABLET | Freq: Every day | ORAL | 0 refills | Status: DC

## 2016-05-25 MED FILL — predniSONE 50 MG TABS: 50 | 3 days supply | Qty: 3 | Fill #0

## 2016-05-25 MED FILL — SYMBICORT 80-4.5 MCG INH: 80-4.5 | 30 days supply | Qty: 10 | Fill #0

## 2016-05-25 MED FILL — VENTOLIN HFA 90 MCG INHALER: 108 (90 BAS | 17 days supply | Qty: 18 | Fill #0

## 2016-05-25 NOTE — Assessment & Plan Note (Signed)
Patient is here with complaints consistent with asthma with acute exacerbation. While the patient is comfortable but is easily progressed to dyspnea when asked to ambulate. Otherwise she is well-appearing. - Prednisone 50 mg daily 3 days - Initiate Symbicort 2 puffs twice a day daily - Refilled albuterol inhaler - Patient was asked to follow-up with PCP to discuss response to controller medication. - Avoidance of NSAIDs  Of note: Patient was noted to have increased her recent NSAID use recently. Patient may benefit from leukotriene inhibitor if NSAID seem to be a trigger to worsen her asthma.

## 2016-05-25 NOTE — Patient Instructions (Signed)
It was a pleasure seeing you today in our clinic. Today we discussed your asthma. Here is the treatment plan we have discussed and agreed upon together:   - I placed an order for prednisone. Take 1 tablet every day over the next 3 days. - I have her new prescription for Symbicort. Use this twice a day every day to help control your asthma. This medication should be taken every day regardless of how your breathing status is. - I've refilled your albuterol. - Please follow-up with Dr. Shawnie PonsPratt in one month to discuss your asthma.

## 2016-05-25 NOTE — Progress Notes (Signed)
COUGH Seems to be getting worse. Increasing need for albuterol inhaler. Waking up multiple times a week to use inhaler.   Has been coughing for 2 weeks. Cough is: dry Sputum production: no Medications tried: albuterol Taking blood pressure medications: no  Symptoms Runny nose: no Mucous in back of throat: no Throat burning or reflux: no Wheezing or asthma: yes, seems to be worsening Fever: no Chest Pain: no Shortness of breath: yes Leg swelling: no Hemoptysis: no Weight loss: no  ROS see HPI Smoking Status noted   CC, SH/smoking status, and VS noted  Objective: BP 126/82   Pulse 77   Temp 98.1 F (36.7 C) (Oral)   Wt (!) 312 lb (141.5 kg)   LMP 05/03/2016   SpO2 99%   BMI 50.36 kg/m  Gen: NAD, alert, cooperative. CV: Well-perfused. Resp: Non-labored. Decent air movement. Some mild wheezes noted bilaterally. Able to complete full sentences  Neuro: Sensation intact throughout.  Assessment and plan:  Asthma with acute exacerbation Patient is here with complaints consistent with asthma with acute exacerbation. While the patient is comfortable but is easily progressed to dyspnea when asked to ambulate. Otherwise she is well-appearing. - Prednisone 50 mg daily 3 days - Initiate Symbicort 2 puffs twice a day daily - Refilled albuterol inhaler - Patient was asked to follow-up with PCP to discuss response to controller medication. - Avoidance of NSAIDs  Of note: Patient was noted to have increased her recent NSAID use recently. Patient may benefit from leukotriene inhibitor if NSAID seem to be a trigger to worsen her asthma.   Meds ordered this encounter  Medications  . budesonide-formoterol (SYMBICORT) 80-4.5 MCG/ACT inhaler    Sig: Inhale 2 puffs into the lungs 2 (two) times daily.    Dispense:  1 Inhaler    Refill:  3  . predniSONE (DELTASONE) 50 MG tablet    Sig: Take 1 tablet (50 mg total) by mouth daily.    Dispense:  3 tablet    Refill:  0  . albuterol  (PROVENTIL HFA;VENTOLIN HFA) 108 (90 Base) MCG/ACT inhaler    Sig: Inhale 2 puffs into the lungs every 4 (four) hours as needed for wheezing or shortness of breath.    Dispense:  1 Inhaler    Refill:  0     Kathee DeltonIan D Eevie Lapp, MD,MS,  PGY3 05/25/2016 1:54 PM

## 2016-06-12 ENCOUNTER — Telehealth: Payer: Self-pay | Admitting: Adult Health

## 2016-06-12 NOTE — Telephone Encounter (Signed)
Pt called said she has been having increase in frequency if migraines the past couple of months. Says it seems to be around her menstrual cycle. [facility] Pt was last seen 7/16 with Dr Lucia GaskinsAhern.

## 2016-06-12 NOTE — Telephone Encounter (Signed)
Noted  

## 2016-06-14 ENCOUNTER — Ambulatory Visit: Payer: 59 | Admitting: Adult Health

## 2016-06-15 ENCOUNTER — Encounter: Payer: Self-pay | Admitting: Adult Health

## 2016-06-20 MED FILL — MELOXICAM 15 MG TABLET: 15 | 30 days supply | Qty: 30 | Fill #0

## 2016-09-09 ENCOUNTER — Emergency Department (HOSPITAL_COMMUNITY)
Admission: EM | Admit: 2016-09-09 | Discharge: 2016-09-09 | Disposition: A | Discharge status: Home / Self Care | Payer: Commercial Managed Care - PPO | Attending: Emergency Medicine | Admitting: Emergency Medicine

## 2016-09-09 ENCOUNTER — Encounter (HOSPITAL_COMMUNITY): Payer: Self-pay | Admitting: Oncology

## 2016-09-09 DIAGNOSIS — M7662 Achilles tendinitis, left leg: Secondary | ICD-10-CM | POA: Insufficient documentation

## 2016-09-09 DIAGNOSIS — Z79899 Other long term (current) drug therapy: Secondary | ICD-10-CM | POA: Diagnosis not present

## 2016-09-09 DIAGNOSIS — J45909 Unspecified asthma, uncomplicated: Secondary | ICD-10-CM | POA: Insufficient documentation

## 2016-09-09 DIAGNOSIS — I10 Essential (primary) hypertension: Secondary | ICD-10-CM | POA: Insufficient documentation

## 2016-09-09 DIAGNOSIS — M7661 Achilles tendinitis, right leg: Secondary | ICD-10-CM | POA: Diagnosis not present

## 2016-09-09 DIAGNOSIS — M25571 Pain in right ankle and joints of right foot: Secondary | ICD-10-CM | POA: Diagnosis present

## 2016-09-09 MED ORDER — METHOCARBAMOL 500 MG PO TABS: 500.0000 mg | ORAL_TABLET | Freq: Two times a day (BID) | ORAL | 0 refills | Status: DC

## 2016-09-09 NOTE — ED Triage Notes (Signed)
Pt c/o b/l ankle pain.  Pt states that they started on Thursday.  Pt states that she has, "Flare ups."  Pt last saw her foot and ankle doctor approximately 1 month ago.  Pt worked several 12 hour shifts prior to initiation of the pain.  Pt rates pain 10/10 and states that it is difficult to ambulate.

## 2016-09-09 NOTE — Discharge Instructions (Signed)
See your Physician for recheck next week  °

## 2016-09-09 NOTE — ED Provider Notes (Signed)
[facility]-EMERGENCY DEPT Provider Note   CSN: [id number] Arrival date & time: 09/09/16  2000  By signing my name below, I, Diona Browner, attest that this documentation has been prepared under the direction and in the presence of Ok Edwards, [place]. Electronically Signed: Diona Browner, ED Scribe. 09/09/16. 9:28 PM.  History   Chief Complaint Chief Complaint  Patient presents with  . Ankle Pain    HPI Mary Hanson is a 41 y.o. female who presents to the Emergency Department complaining of severe bilateral achilles tendon pain that started on Thursday, 09/07/16. She notes she has worked several 12 hour shifts prior to initiation of the pain. Pt rates her pain a 10/10 severity and that it is difficult to ambulate. She last saw her foot and ankle doctor ~ 1 month ago. She was given medication to help discomfort which has helped up until this past Thursday. Pt also c/o of spasms in both ankles. She has taken motrin with temporary/ mild relief. Pt denies fever or any other sx at this time.  The history is provided by the patient. No language interpreter was used.    Past Medical History:  Diagnosis Date  . Allergy-induced asthma    prn inhaler  . History of anemia    no current problem, per pt.  . Hypertension    under control with med., has been on med. x 1 yr.  . Migraines   . Pyogenic granuloma 12/2013   right ring finger  . Seasonal allergies     Patient Active Problem List   Diagnosis Date Noted  . Fatigue 02/10/2016  . Solitary pulmonary nodule 02/10/2016  . Shortness of breath 12/31/2015  . Menorrhagia 05/31/2015  . Asthma with acute exacerbation 03/01/2015  . Peripheral neuralgia 11/11/2014  . Enlarged thyroid 04/03/2013  . Unspecified vitamin D deficiency 07/29/2012  . External hemorrhoid 09/06/2011  . Hypertension 12/29/2010  . Morbid obesity (HCC) 12/29/2010  . Asthma 12/29/2010  . Allergic rhinitis 12/29/2010  . Migraine headache 12/29/2010    Past  Surgical History:  Procedure Laterality Date  . CERVICAL CONE BIOPSY  age 73  . CHOLECYSTECTOMY  2006 or 2007  . DILATION AND CURETTAGE OF UTERUS    . DILATION AND EVACUATION  age 13  . MASS EXCISION Right 01/16/2014   Procedure: EXCISION PYOGENIC GRANULOMA FROM RIGHT RING FINGER;  Surgeon: Dairl Ponder, MD;  Location: [facility];  Service: Orthopedics;  Laterality: Right;    OB History    Gravida Para Term Preterm AB Living   1       1 0   SAB TAB Ectopic Multiple Live Births     1             Home Medications    Prior to Admission medications   Medication Sig Start Date End Date Taking? Authorizing Provider  albuterol (PROVENTIL HFA;VENTOLIN HFA) 108 (90 Base) MCG/ACT inhaler Inhale 2 puffs into the lungs every 4 (four) hours as needed for wheezing or shortness of breath. 05/25/16   McKeag, Janine Ores, MD  amoxicillin (AMOXIL) 500 MG capsule Take 2 capsules (1,000 mg total) by mouth 3 (three) times daily. Patient not taking: Reported on 02/10/2016 12/31/15   Marquette Saa, MD  budesonide-formoterol [facility]) 80-4.5 MCG/ACT inhaler Inhale 2 puffs into the lungs 2 (two) times daily. 05/25/16   McKeag, Janine Ores, MD  cetirizine (ZYRTEC) 10 MG tablet Take 10 mg by mouth.    [provider]  cyclobenzaprine (FLEXERIL) 5  MG tablet Take 2 tablets (10 mg total) by mouth 3 (three) times daily as needed for muscle spasms. Patient not taking: Reported on 02/10/2016 01/16/16   Arvilla Meres L, PA-C  diazepam (VALIUM) 5 MG tablet Take 1 tablet (5 mg total) by mouth every 6 (six) hours as needed for anxiety (spasms). Patient not taking: Reported on 02/10/2016 09/20/15   Melene Plan, DO  eletriptan (RELPAX) 40 MG tablet Take 1 tablet (40 mg total) by mouth as needed for migraine or headache. May repeat in 2 hours if headache persists or recurs. 11/15/14   Anson Fret, MD  hydrochlorothiazide (HYDRODIURIL) 25 MG tablet Take 1 tablet (25 mg total) by mouth daily. 05/31/15    Reva Bores, MD  HYDROcodone-homatropine [facility]) 5-1.5 MG/5ML syrup Take 5 mLs by mouth every 8 (eight) hours as needed for cough. Patient not taking: Reported on 02/10/2016 12/31/15   Marquette Saa, MD  ibuprofen (ADVIL,MOTRIN) 200 MG tablet Take 800 mg by mouth every 6 (six) hours as needed for headache.    [provider]  megestrol (MEGACE) 40 MG tablet Take 1 tablet (40 mg total) by mouth 2 (two) times daily. Take 1 tablet twice a day for bleeding. If needed, can take 2 tablets twice a day 05/31/15   Reva Bores, MD  naproxen (NAPROSYN) 500 MG tablet Take 1 tablet (500 mg total) by mouth 2 (two) times daily with a meal. Patient not taking: Reported on 02/10/2016 01/16/16   Deborha Payment, PA-C  nystatin ointment (MYCOSTATIN) Apply 1 application topically 2 (two) times daily. Patient not taking: Reported on 02/10/2016 05/31/15   Reva Bores, MD  predniSONE (DELTASONE) 50 MG tablet Take 1 tablet (50 mg total) by mouth daily. 05/25/16   McKeag, Janine Ores, MD  prochlorperazine (COMPAZINE) 5 MG tablet Take 1-2 tablets (5-10 mg total) by mouth every 8 (eight) hours as needed for nausea or vomiting. 11/02/14   Althea Charon, Netta Neat, DO  SUMAtriptan (IMITREX) 100 MG tablet Take 1 tablet (100 mg total) by mouth once. May repeat in 2 hours if headache persists or recurs. Patient not taking: Reported on 02/10/2016 11/11/14   Anson Fret, MD    Family History Family History  Problem Relation Age of Onset  . Diabetes type II Father   . Hypertension Father   . Hypertension Mother   . Goiter Sister 18       identical twin  . Asthma Sister   . Hypertension Sister   . Stroke Maternal Grandfather   . Diabetes Paternal Grandfather   . Heart disease Paternal Grandfather     Social History Social History  Substance Use Topics  . Smoking status: Never Smoker  . Smokeless tobacco: Never Used  . Alcohol use No     Comment: Wine once a year      Allergies   Augmentin  [amoxicillin-pot clavulanate]; Lisinopril; and Zofran [ondansetron hcl]   Review of Systems Review of Systems  Constitutional: Negative for fever.  Musculoskeletal: Positive for arthralgias.  All other systems reviewed and are negative.    Physical Exam Updated Vital Signs BP (!) 150/93 (BP Location: Right Wrist)   Pulse 91   Temp 98.6 F (37 C) (Oral)   Resp 20   Ht 5\' 6"  (1.676 m)   Wt 300 lb (136.1 kg)   LMP 08/15/2016 (Approximate)   SpO2 98%   BMI 48.42 kg/m   Physical Exam  Constitutional: She appears well-developed and well-nourished. No distress.  HENT:  Head: Normocephalic and atraumatic.  Eyes: Conjunctivae are normal.  Neck: Normal range of motion.  Cardiovascular: Normal rate.   Pulmonary/Chest: Effort normal.  Abdominal: She exhibits no distension.  Musculoskeletal: Normal range of motion. She exhibits tenderness. She exhibits no edema.  Tenderness to bilateral achilles tendons.  No erythema or edema.  Neurological: She is alert.  Skin: No pallor.  Psychiatric: She has a normal mood and affect. Her behavior is normal.  Nursing note and vitals reviewed.    ED Treatments / Results  DIAGNOSTIC STUDIES: Oxygen Saturation is 98% on RA, normal by my interpretation.   COORDINATION OF CARE: 9:28 PM-Discussed next steps with pt which includes staying off of her feet for the next few days. Using ice and stretching tendons. Pt verbalized understanding and is agreeable with the plan.    Labs (all labs ordered are listed, but only abnormal results are displayed) Labs Reviewed - No data to display  EKG  EKG Interpretation None       Radiology No results found.  Procedures Procedures (including critical care time)  Medications Ordered in ED Medications - No data to display   Initial Impression / Assessment and Plan / ED Course  I have reviewed the triage vital signs and the nursing notes.  Pertinent labs & imaging results that were available  during my care of the patient were reviewed by me and considered in my medical decision making (see chart for details).     An After Visit Summary was printed and given to the patient.  I personally performed the services in this documentation, which was scribed in my presence.  The recorded information has been reviewed and considered.   Barnet PallKaren SofiaPAC. Meds ordered this encounter  Medications  . methocarbamol (ROBAXIN) 500 MG tablet    Sig: Take 1 tablet (500 mg total) by mouth 2 (two) times daily.    Dispense:  20 tablet    Refill:  0    Order Specific Question:   Supervising Provider    Answer:   Eber HongMILLER, BRIAN [[id number]]    Final Clinical Impressions(s) / ED Diagnoses   Final diagnoses:  Achilles tendonitis, bilateral    New Prescriptions Discharge Medication List as of 09/09/2016  9:31 PM    START taking these medications   Details  methocarbamol (ROBAXIN) 500 MG tablet Take 1 tablet (500 mg total) by mouth 2 (two) times daily., Starting Sat 09/09/2016, Print           Elson AreasSofia, Sakib Noguez K, PA-C 09/09/16 2210    Jacalyn LefevreHaviland, Julie, MD 09/10/16 [phone]

## 2016-09-29 ENCOUNTER — Other Ambulatory Visit: Payer: Self-pay | Admitting: Family Medicine

## 2016-09-29 DIAGNOSIS — I10 Essential (primary) hypertension: Secondary | ICD-10-CM

## 2016-10-12 MED FILL — HYDROCHLOROTHIAZIDE 25 MG T: 25 | 30 days supply | Qty: 30 | Fill #0

## 2017-01-08 MED FILL — HYDROCHLOROTHIAZIDE 25 MG T: 25 | 90 days supply | Qty: 90 | Fill #1

## 2017-01-11 ENCOUNTER — Emergency Department (HOSPITAL_COMMUNITY)
Admission: EM | Admit: 2017-01-11 | Discharge: 2017-01-11 | Disposition: A | Discharge status: Home / Self Care | Payer: PRIVATE HEALTH INSURANCE | Attending: Emergency Medicine | Admitting: Emergency Medicine

## 2017-01-11 ENCOUNTER — Encounter (HOSPITAL_COMMUNITY): Payer: Self-pay

## 2017-01-11 DIAGNOSIS — Z79899 Other long term (current) drug therapy: Secondary | ICD-10-CM | POA: Insufficient documentation

## 2017-01-11 DIAGNOSIS — J45909 Unspecified asthma, uncomplicated: Secondary | ICD-10-CM | POA: Insufficient documentation

## 2017-01-11 DIAGNOSIS — I10 Essential (primary) hypertension: Secondary | ICD-10-CM | POA: Insufficient documentation

## 2017-01-11 LAB — COMPREHENSIVE METABOLIC PANEL
ALBUMIN: 3.6 g/dL (ref 3.5–5.0)
ALK PHOS: 98 U/L (ref 38–126)
ALT: 15 U/L (ref 14–54)
ANION GAP: 11 (ref 5–15)
AST: 17 U/L (ref 15–41)
BUN: 10 mg/dL (ref 6–20)
CALCIUM: 9.2 mg/dL (ref 8.9–10.3)
CHLORIDE: 100 mmol/L — AB (ref 101–111)
CO2: 29 mmol/L (ref 22–32)
CREATININE: 0.75 mg/dL (ref 0.44–1.00)
GFR calc Af Amer: >60 mL/min (ref >60)
GFR calc non Af Amer: >60 mL/min (ref >60)
GLUCOSE: 118 mg/dL — AB (ref 65–99)
Potassium: 3.4 mmol/L — ABNORMAL LOW (ref 3.5–5.1)
SODIUM: 140 mmol/L (ref 135–145)
Total Bilirubin: 0.5 mg/dL (ref 0.3–1.2)
Total Protein: 8 g/dL (ref 6.5–8.1)

## 2017-01-11 LAB — CBC WITH DIFFERENTIAL/PLATELET
BASOS ABS: 0 10*3/uL (ref 0.0–0.1)
BASOS PCT: 0 %
EOS ABS: 0.3 10*3/uL (ref 0.0–0.7)
Eosinophils Relative: 4 %
HCT: 41.2 % (ref 36.0–46.0)
HEMOGLOBIN: 13.5 g/dL (ref 12.0–15.0)
Lymphocytes Relative: 25 %
Lymphs Abs: 2 10*3/uL (ref 0.7–4.0)
MCH: 27.1 pg (ref 26.0–34.0)
MCHC: 32.8 g/dL (ref 30.0–36.0)
MCV: 82.7 fL (ref 78.0–100.0)
MONOS PCT: 6 %
Monocytes Absolute: 0.5 10*3/uL (ref 0.1–1.0)
NEUTROS PCT: 65 %
Neutro Abs: 5.4 10*3/uL (ref 1.7–7.7)
Platelets: 296 10*3/uL (ref 150–400)
RBC: 4.98 MIL/uL (ref 3.87–5.11)
RDW: 14.9 % (ref 11.5–15.5)
WBC: 8.2 10*3/uL (ref 4.0–10.5)

## 2017-01-11 MED ORDER — AMLODIPINE BESYLATE 5 MG PO TABS
5.0000 mg | ORAL_TABLET | Freq: Once | ORAL | Status: AC
  Administered 2017-01-11: 5 mg via ORAL
  Filled 2017-01-11: qty 1

## 2017-01-11 MED ORDER — ONDANSETRON 4 MG PO TBDP
4.0000 mg | ORAL_TABLET | Freq: Once | ORAL | Status: DC
  Filled 2017-01-11: qty 1

## 2017-01-11 MED ORDER — PROCHLORPERAZINE EDISYLATE 5 MG/ML IJ SOLN
10.0000 mg | Freq: Once | INTRAMUSCULAR | Status: AC
  Administered 2017-01-11: 10 mg via INTRAVENOUS
  Filled 2017-01-11: qty 2

## 2017-01-11 NOTE — ED Notes (Signed)
Pt c/o hypertension. Also, c/o swelling in the legs and urinary frequency onset 2 weeks ago after an allergic reaction to a bug bite. She takes  hydrochlorothiazide daily in the mornings.

## 2017-01-11 NOTE — ED Triage Notes (Signed)
Pt was taking lisinopril/HCTZ and had an allergic reaction, now she's just taking HCTZ and her blood pressure is elevated, she also states that she doesn't feel good and has a headache Pt also says that her feet have been swelling

## 2017-01-11 NOTE — ED Provider Notes (Signed)
[facility]-EMERGENCY DEPT Provider Note   CSN: [id number] Arrival date & time: 01/11/17  0347     History   Chief Complaint Chief Complaint  Patient presents with  . Hypertension    HPI Mary Hanson is a 41 y.o. female.  Patient states that her blood pressure is running high. She takes HCTZ.   The history is provided by the patient. No language interpreter was used.  Hypertension  This is a recurrent problem. The current episode started more than 2 days ago. The problem occurs constantly. The problem has not changed since onset.Associated symptoms include chest pain. Pertinent negatives include no abdominal pain and no headaches. Nothing aggravates the symptoms.    Past Medical History:  Diagnosis Date  . Allergy-induced asthma    prn inhaler  . History of anemia    no current problem, per pt.  . Hypertension    under control with med., has been on med. x 1 yr.  . Migraines   . Pyogenic granuloma 12/2013   right ring finger  . Seasonal allergies     Patient Active Problem List   Diagnosis Date Noted  . Fatigue 02/10/2016  . Solitary pulmonary nodule 02/10/2016  . Shortness of breath 12/31/2015  . Menorrhagia 05/31/2015  . Asthma with acute exacerbation 03/01/2015  . Peripheral neuralgia 11/11/2014  . Enlarged thyroid 04/03/2013  . Unspecified vitamin D deficiency 07/29/2012  . External hemorrhoid 09/06/2011  . Hypertension 12/29/2010  . Morbid obesity (HCC) 12/29/2010  . Asthma 12/29/2010  . Allergic rhinitis 12/29/2010  . Migraine headache 12/29/2010    Past Surgical History:  Procedure Laterality Date  . CERVICAL CONE BIOPSY  age 55  . CHOLECYSTECTOMY  2006 or 2007  . DILATION AND CURETTAGE OF UTERUS    . DILATION AND EVACUATION  age 68  . MASS EXCISION Right 01/16/2014   Procedure: EXCISION PYOGENIC GRANULOMA FROM RIGHT RING FINGER;  Surgeon: Dairl Ponder, MD;  Location: [facility];  Service: Orthopedics;  Laterality: Right;    OB  History    Gravida Para Term Preterm AB Living   1       1 0   SAB TAB Ectopic Multiple Live Births     1             Home Medications    Prior to Admission medications   Medication Sig Start Date End Date Taking? Authorizing Provider  albuterol (PROVENTIL HFA;VENTOLIN HFA) 108 (90 Base) MCG/ACT inhaler Inhale 2 puffs into the lungs every 4 (four) hours as needed for wheezing or shortness of breath. 05/25/16  Yes McKeag, Janine Ores, MD  aspirin-acetaminophen-caffeine (EXCEDRIN MIGRAINE) [phone] MG tablet Take 2 tablets by mouth every 6 (six) hours as needed for headache.   Yes [provider]  budesonide-formoterol (SYMBICORT) 80-4.5 MCG/ACT inhaler Inhale 2 puffs into the lungs 2 (two) times daily. 05/25/16  Yes McKeag, Janine Ores, MD  eletriptan (RELPAX) 40 MG tablet Take 1 tablet (40 mg total) by mouth as needed for migraine or headache. May repeat in 2 hours if headache persists or recurs. 11/15/14  Yes Anson Fret, MD  hydrochlorothiazide (HYDRODIURIL) 25 MG tablet TAKE 1 TABLET (25 MG TOTAL) BY MOUTH DAILY. 09/30/16  Yes Reva Bores, MD  ibuprofen (ADVIL,MOTRIN) 200 MG tablet Take 800 mg by mouth every 6 (six) hours as needed for headache.   Yes [provider]  amoxicillin (AMOXIL) 500 MG capsule Take 2 capsules (1,000 mg total) by mouth 3 (three) times daily.  Patient not taking: Reported on 02/10/2016 12/31/15   Marquette Saa, MD  cyclobenzaprine (FLEXERIL) 5 MG tablet Take 2 tablets (10 mg total) by mouth 3 (three) times daily as needed for muscle spasms. Patient not taking: Reported on 02/10/2016 01/16/16   Arvilla Meres L, PA-C  diazepam (VALIUM) 5 MG tablet Take 1 tablet (5 mg total) by mouth every 6 (six) hours as needed for anxiety (spasms). Patient not taking: Reported on 02/10/2016 09/20/15   Melene Plan, DO  HYDROcodone-homatropine [facility]) 5-1.5 MG/5ML syrup Take 5 mLs by mouth every 8 (eight) hours as needed for cough. Patient not taking: Reported on  02/10/2016 12/31/15   Marquette Saa, MD  megestrol (MEGACE) 40 MG tablet Take 1 tablet (40 mg total) by mouth 2 (two) times daily. Take 1 tablet twice a day for bleeding. If needed, can take 2 tablets twice a day Patient not taking: Reported on 01/11/2017 05/31/15   Reva Bores, MD  methocarbamol (ROBAXIN) 500 MG tablet Take 1 tablet (500 mg total) by mouth 2 (two) times daily. Patient not taking: Reported on 01/11/2017 09/09/16   Elson Areas, PA-C  naproxen (NAPROSYN) 500 MG tablet Take 1 tablet (500 mg total) by mouth 2 (two) times daily with a meal. Patient not taking: Reported on 02/10/2016 01/16/16   Deborha Payment, PA-C  nystatin ointment (MYCOSTATIN) Apply 1 application topically 2 (two) times daily. Patient not taking: Reported on 02/10/2016 05/31/15   Reva Bores, MD  predniSONE (DELTASONE) 50 MG tablet Take 1 tablet (50 mg total) by mouth daily. Patient not taking: Reported on 01/11/2017 05/25/16   McKeag, Janine Ores, MD  prochlorperazine (COMPAZINE) 5 MG tablet Take 1-2 tablets (5-10 mg total) by mouth every 8 (eight) hours as needed for nausea or vomiting. Patient not taking: Reported on 01/11/2017 11/02/14   Smitty Cords, DO  SUMAtriptan (IMITREX) 100 MG tablet Take 1 tablet (100 mg total) by mouth once. May repeat in 2 hours if headache persists or recurs. Patient not taking: Reported on 02/10/2016 11/11/14   Anson Fret, MD    Family History Family History  Problem Relation Age of Onset  . Diabetes type II Father   . Hypertension Father   . Hypertension Mother   . Goiter Sister 64       identical twin  . Asthma Sister   . Hypertension Sister   . Stroke Maternal Grandfather   . Diabetes Paternal Grandfather   . Heart disease Paternal Grandfather     Social History Social History  Substance Use Topics  . Smoking status: Never Smoker  . Smokeless tobacco: Never Used  . Alcohol use No     Comment: Wine once a year      Allergies   Augmentin  [amoxicillin-pot clavulanate]; Lisinopril; and Zofran [ondansetron hcl]   Review of Systems Review of Systems  Constitutional: Negative for appetite change and fatigue.  HENT: Negative for congestion, ear discharge and sinus pressure.   Eyes: Negative for discharge.  Respiratory: Negative for cough.   Cardiovascular: Positive for chest pain.  Gastrointestinal: Negative for abdominal pain and diarrhea.  Genitourinary: Negative for frequency and hematuria.  Musculoskeletal: Negative for back pain.  Skin: Negative for rash.  Neurological: Negative for seizures and headaches.  Psychiatric/Behavioral: Negative for hallucinations.     Physical Exam Updated Vital Signs BP (!) 148/71 (BP Location: Left Arm)   Pulse 68   Temp 98.2 F (36.8 C) (Oral)   Resp 17  Ht  (1.676 m)   Wt (!) 137.7 kg (303 lb 8 oz)   LMP 01/08/2017   SpO2 99%   BMI 48.99 kg/m   Physical Exam  Constitutional: She is oriented to person, place, and time. She appears well-developed.  HENT:  Head: Normocephalic.  Eyes: Conjunctivae and EOM are normal. No scleral icterus.  Neck: Neck supple. No thyromegaly present.  Cardiovascular: Normal rate and regular rhythm.  Exam reveals no gallop and no friction rub.   No murmur heard. Pulmonary/Chest: No stridor. She has no wheezes. She has no rales. She exhibits no tenderness.  Abdominal: She exhibits no distension. There is no tenderness. There is no rebound.  Musculoskeletal: Normal range of motion. She exhibits no edema.  Lymphadenopathy:    She has no cervical adenopathy.  Neurological: She is oriented to person, place, and time. She exhibits normal muscle tone. Coordination normal.  Skin: No rash noted. No erythema.  Psychiatric: She has a normal mood and affect. Her behavior is normal.     ED Treatments / Results  Labs (all labs ordered are listed, but only abnormal results are displayed) Labs Reviewed  COMPREHENSIVE METABOLIC PANEL - Abnormal;  Notable for the following:       Result Value   Potassium 3.4 (*)    Chloride 100 (*)    Glucose, Bld 118 (*)    All other components within normal limits  CBC WITH DIFFERENTIAL/PLATELET    EKG  EKG Interpretation None       Radiology No results found.  Procedures Procedures (including critical care time)  Medications Ordered in ED Medications  ondansetron (ZOFRAN-ODT) disintegrating tablet 4 mg (4 mg Oral Refused 01/11/17 0822)  amLODipine (NORVASC) tablet 5 mg (5 mg Oral Given 01/11/17 0823)  prochlorperazine (COMPAZINE) injection 10 mg (10 mg Intravenous Given 01/11/17 0844)     Initial Impression / Assessment and Plan / ED Course  I have reviewed the triage vital signs and the nursing notes.  Pertinent labs & imaging results that were available during my care of the patient were reviewed by me and considered in my medical decision making (see chart for details).     Patient with mildly elevated blood pressure. She was given 5 mg of Norvasc and her blood pressure came down to 148/71. She'll follow-up with her PCP tomorrow  Final Clinical Impressions(s) / ED Diagnoses   Final diagnoses:  Essential hypertension    New Prescriptions New Prescriptions   No medications on file     Bethann Berkshire, MD 01/11/17 [phone]

## 2017-01-11 NOTE — Discharge Instructions (Signed)
Follow-up with her family practice doctor tomorrow as planned. You were given 5 mg of Norvasc and your blood pressure went from 170/95  to  148/71

## 2017-01-12 ENCOUNTER — Encounter: Payer: Self-pay | Admitting: Internal Medicine

## 2017-01-12 ENCOUNTER — Ambulatory Visit (INDEPENDENT_AMBULATORY_CARE_PROVIDER_SITE_OTHER): Payer: PRIVATE HEALTH INSURANCE | Admitting: Internal Medicine

## 2017-01-12 ENCOUNTER — Ambulatory Visit: Payer: PRIVATE HEALTH INSURANCE | Admitting: Family Medicine

## 2017-01-12 VITALS — BP 138/89 | HR 92 | Temp 98.5°F | Ht 66.0 in | Wt 299.4 lb

## 2017-01-12 DIAGNOSIS — G43001 Migraine without aura, not intractable, with status migrainosus: Secondary | ICD-10-CM

## 2017-01-12 DIAGNOSIS — R51 Headache: Secondary | ICD-10-CM

## 2017-01-12 DIAGNOSIS — R519 Headache, unspecified: Secondary | ICD-10-CM

## 2017-01-12 DIAGNOSIS — I10 Essential (primary) hypertension: Secondary | ICD-10-CM

## 2017-01-12 MED ORDER — ALBUTEROL SULFATE HFA 108 (90 BASE) MCG/ACT IN AERS: 2.0000 | INHALATION_SPRAY | RESPIRATORY_TRACT | 0 refills | Status: DC | PRN

## 2017-01-12 MED ORDER — AMLODIPINE BESYLATE 5 MG PO TABS: 5.0000 mg | ORAL_TABLET | Freq: Every day | ORAL | 3 refills | Status: DC

## 2017-01-12 MED ORDER — BUTALBITAL-APAP-CAFFEINE 50-325-40 MG PO TABS: 1.0000 | ORAL_TABLET | Freq: Four times a day (QID) | ORAL | 1 refills | Status: DC | PRN

## 2017-01-12 MED ORDER — SUMATRIPTAN SUCCINATE 100 MG PO TABS: 100.0000 mg | ORAL_TABLET | Freq: Once | ORAL | 2 refills | Status: DC

## 2017-01-12 MED FILL — AMLODIPINE BESYLATE 5 MG TA: 5 | 90 days supply | Qty: 90 | Fill #0

## 2017-01-12 MED FILL — SUMATRIPTAN SUCC 100 MG TAB: 100 | 14 days supply | Qty: 9 | Fill #0

## 2017-01-12 NOTE — Progress Notes (Signed)
[name] Progress Note  Subjective:  Mary Hanson is a 41 y.o. female with history of HTN, migraines, asthma, and morbid obesity who presents for follow-up ED visit for HTN. She also has complaint of headaches.  #Elevated BP: - Patient says for last 2 weeks has seen her DBP over 100 - She became more concerned because she now is generally not feeling well with increased fatigue, needing to use inhaler more, and not sleeping well - Went to ED yesterday for this and was given amlodipine 5 mg and told to follow-up with PCP. BP at that time was 148/71 - Patient normally takes HCTZ 25 mg daily. Cannot take lisinopril due to history of lip swelling on this.  - Says no changes in her diet ROS: Says she has occasional blurry vision (does not wear glasses), some chest tightness but no pain  #Headaches: - Ongoing for last couple of weeks. Not responding to excedrin. Seems somewhat different from her normal migraines but still with some light sensitivity. Used to have rx for imitrex but ran out. Describes as occurring in front of head.  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 However, she denies daytime sleepiness. Unsure if she has pauses in breathing at night. - Has had increased nasal congestion; not taking anything for this ROS: No fevers  Allergies  Allergen Reactions  . Augmentin [Amoxicillin-Pot Clavulanate] Nausea And Vomiting  . Lisinopril Swelling    Lip swelling/Angioedema  . Zofran [Ondansetron Hcl] Other (See Comments)    Severe headache    Objective: Blood pressure 138/89, pulse 92, temperature 98.5 F (36.9 C), temperature source Oral, height  (1.676 m), weight 299 lb 6.4 oz (135.8 kg), last menstrual period 01/08/2017, SpO2 97 %. Initial BP 130/98. Body mass index is 48.32  kg/m. Constitutional: Morbidly obese female in NAD HENT: MMM, erythematous and swollen nasal turbinates, tenderness to percussion over frontal and maxillary sinuses Cardiovascular: RRR, S1, S2, no m/r/g.  Pulmonary/Chest: Effort normal and breath sounds normal.  Neurological: AOx3, no focal deficits. CNII-XII intact. Peripheral vision intact with finger counting. No papilledema  Skin: Skin is warm and dry. No rash noted. No erythema.  Psychiatric: Normal mood and affect.  Vitals reviewed  Assessment/Plan: Essential hypertension - Not elevated today with repeat check. However, patient reports consistent elevated SBP at work and BP elevated at ED yesterday.  - Continue HCTZ 25 mg day. Will add amlodipine 5 mg daily.   Nonintractable headache - Precepted with Dr. Lum Babe. Patient with normal neuro exam. Could be migraine, as patient has a history of this. Headaches are not intractable but are occurring in the morning. Age and lack of papilledema make intracranial pathology unlikely. Patient at high risk of OSA with STOP-BANG score of 3 (for snoring, BMI, and HTN) but cannot afford sleep study at this time; recommended applying for [facility]. Sinus congestion could also be contributing; to use nasal saline drops. Elevated BP another possibility, though normotensive at visit today.  - Refilled imitrex and provided rx for fioricet.  - To add amlodipine to BP regimen. - Would recommend sleep study when able to afford.  - Counseled to return to ED if having any vision changes associated with headaches to have imaging performed.   Follow-up next week to follow-up headaches and BP.  Mary  Sampson Goon, MD [name], PGY-3

## 2017-01-12 NOTE — Patient Instructions (Signed)
Ms. Fregeau,  Please start amlodipine 5 mg in addition to hctz 25 mg daily.  For headache, use nasal saline and try fioricet. I have also refilled your sumatriptan. If you have persistent blurry vision with headache, please go to the emergency room.  I refilled your inhalers.  You will be contacted about the [employer].  Best, Dr. Sampson Goon

## 2017-01-14 ENCOUNTER — Encounter: Payer: Self-pay | Admitting: Internal Medicine

## 2017-01-14 DIAGNOSIS — R519 Headache, unspecified: Secondary | ICD-10-CM | POA: Insufficient documentation

## 2017-01-14 DIAGNOSIS — R51 Headache: Secondary | ICD-10-CM

## 2017-01-14 NOTE — Assessment & Plan Note (Signed)
-   Not elevated today with repeat check. However, patient reports consistent elevated SBP at work and BP elevated at ED yesterday.  - Continue HCTZ 25 mg day. Will add amlodipine 5 mg daily.

## 2017-01-14 NOTE — Assessment & Plan Note (Signed)
-   Precepted with Dr. Lum Babe. Patient with normal neuro exam. Could be migraine, as patient has a history of this. Headaches are not intractable but are occurring in the morning. Age and lack of papilledema make intracranial pathology unlikely. Patient at high risk of OSA with STOP-BANG score of 3 (for snoring, BMI, and HTN) but cannot afford sleep study at this time; recommended applying for [employer]. Sinus congestion could also be contributing; to use nasal saline drops. Elevated BP another possibility, though normotensive at visit today.  - Refilled imitrex and provided rx for fioricet.  - To add amlodipine to BP regimen. - Would recommend sleep study when able to afford.  - Counseled to return to ED if having any vision changes associated with headaches to have imaging performed.
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
## 2017-03-30 ENCOUNTER — Encounter: Payer: Self-pay | Admitting: Family Medicine

## 2017-03-30 ENCOUNTER — Other Ambulatory Visit: Payer: Self-pay

## 2017-03-30 ENCOUNTER — Ambulatory Visit (INDEPENDENT_AMBULATORY_CARE_PROVIDER_SITE_OTHER): Payer: Self-pay | Admitting: Family Medicine

## 2017-03-30 DIAGNOSIS — J45901 Unspecified asthma with (acute) exacerbation: Secondary | ICD-10-CM

## 2017-03-30 MED ORDER — PREDNISONE 50 MG PO TABS: 50.0000 mg | ORAL_TABLET | Freq: Every day | ORAL | 0 refills | Status: AC

## 2017-03-30 MED ORDER — PREDNISONE 50 MG PO TABS: 50.0000 mg | ORAL_TABLET | Freq: Every day | ORAL | 0 refills | Status: DC

## 2017-03-30 MED ORDER — BUDESONIDE-FORMOTEROL FUMARATE 160-4.5 MCG/ACT IN AERO: 2.0000 | INHALATION_SPRAY | Freq: Two times a day (BID) | RESPIRATORY_TRACT | 0 refills | Status: DC

## 2017-03-30 MED ORDER — IPRATROPIUM BROMIDE 0.02 % IN SOLN
0.5000 mg | Freq: Once | RESPIRATORY_TRACT | Status: AC
  Administered 2017-03-30: 0.5 mg via RESPIRATORY_TRACT

## 2017-03-30 MED ORDER — METHYLPREDNISOLONE SODIUM SUCC 125 MG IJ SOLR
80.0000 mg | Freq: Once | INTRAMUSCULAR | Status: AC
Start: 2017-03-30 — End: 2017-03-30
  Administered 2017-03-30: 80 mg via INTRAMUSCULAR

## 2017-03-30 MED ORDER — ALBUTEROL SULFATE (2.5 MG/3ML) 0.083% IN NEBU
2.5000 mg | INHALATION_SOLUTION | Freq: Once | RESPIRATORY_TRACT | Status: AC
  Administered 2017-03-30: 2.5 mg via RESPIRATORY_TRACT

## 2017-03-30 NOTE — Patient Instructions (Signed)
Asthma Attack Prevention, Adult Although you may not be able to control the fact that you have asthma, you can take actions to prevent episodes of asthma (asthma attacks). These actions include:  Creating a written plan for managing and treating your asthma attacks (asthma action plan).  Monitoring your asthma.  Avoiding things that can irritate your airways or make your asthma symptoms worse (asthma triggers).  Taking your medicines as directed.  Acting quickly if you have signs or symptoms of an asthma attack. What are some ways to prevent an asthma attack? Create a plan Work with your health care provider to create an asthma action plan. This plan should include:  A list of your asthma triggers and how to avoid them.  A list of symptoms that you experience during an asthma attack.  Information about when to take medicine and how much medicine to take.  Information to help you understand your peak flow measurements.  Contact information for your health care providers.  Daily actions that you can take to control asthma. Monitor your asthma   To monitor your asthma:  Use your peak flow meter every morning and every evening for 2-3 weeks. Record the results in a journal. A drop in your peak flow numbers on one or more days may mean that you are starting to have an asthma attack, even if you are not having symptoms.  When you have asthma symptoms, write them down in a journal. Avoid asthma triggers   Work with your health care provider to find out what your asthma triggers are. This can be done by:  Being tested for allergies.  Keeping a journal that notes when asthma attacks occur and what may have contributed to them.  Asking your health care provider whether other medical conditions make your asthma worse. Common asthma triggers include:  Dust.  Smoke. This includes campfire smoke and secondhand smoke from tobacco products.  Pet dander.  Trees, grasses or  pollens.  Very cold, dry, or humid air.  Mold.  Foods that contain high amounts of sulfites.  Strong smells.  Engine exhaust and air pollution.  Aerosol sprays and fumes from household cleaners.  Household pests and their droppings, including dust mites and cockroaches.  Certain medicines, including NSAIDs. Once you have determined your asthma triggers, take steps to avoid them. Depending on your triggers, you may be able to reduce the chance of an asthma attack by:  Keeping your home clean. Have someone dust and vacuum your home for you 1 or 2 times a week. If possible, have them use a high-efficiency particulate arrestance (HEPA) vacuum.  Washing your sheets weekly in hot water.  Using allergy-proof mattress covers and casings on your bed.  Keeping pets out of your home.  Taking care of mold and water problems in your home.  Avoiding areas where people smoke.  Avoiding using strong perfumes or odor sprays.  Avoid spending a lot of time outdoors when pollen counts are high and on very windy days.  Talking with your health care provider before stopping or starting any new medicines. Medicines Take over-the-counter and prescription medicines only as told by your health care provider. Many asthma attacks can be prevented by carefully following your medicine schedule. Taking your medicines correctly is especially important when you cannot avoid certain asthma triggers. Even if you are doing well, do not stop taking your medicine and do not take less medicine. Act quickly If an asthma attack happens, acting quickly can decrease how severe   it is and how long it lasts. Take these actions:  Pay attention to your symptoms. If you are coughing, wheezing, or having difficulty breathing, do not wait to see if your symptoms go away on their own. Follow your asthma action plan.  If you have followed your asthma action plan and your symptoms are not improving, call your health care  provider or seek immediate medical care at the nearest hospital. It is important to write down how often you need to use your fast-acting rescue inhaler. You can track how often you use an inhaler in your journal. If you are using your rescue inhaler more often, it may mean that your asthma is not under control. Adjusting your asthma treatment plan may help you to prevent future asthma attacks and help you to gain better control of your condition. How can I prevent an asthma attack when I exercise?   Exercise is a common asthma trigger. To prevent asthma attacks during exercise:  Follow advice from your health care provider about whether you should use your fast-acting inhaler before exercising. Many people with asthma experience exercise-induced bronchoconstriction (EIB). This condition often worsens during vigorous exercise in cold, humid, or dry environments. Usually, people with EIB can stay very active by using a fast-acting inhaler before exercising.  Avoid exercising outdoors in very cold or humid weather.  Avoid exercising outdoors when pollen counts are high.  Warm up and cool down when exercising.  Stop exercising right away if asthma symptoms start. Consider taking part in exercises that are less likely to cause asthma symptoms such as:  Indoor swimming.  Biking.  Walking.  Hiking.  Playing football. This information is not intended to replace advice given to you by your health care provider. Make sure you discuss any questions you have with your health care provider. Document Released: 03/29/2009 Document Revised: 12/10/2015 Document Reviewed: 09/25/2015 Elsevier Interactive Patient Education  2017 [employer]  

## 2017-03-30 NOTE — Progress Notes (Signed)
Subjective:     Patient ID: Mary GrinderGaye Hanson, female   DOB: 1976-03-26, 41 y.o.   MRN: [id number]  Asthma  She complains of cough, shortness of breath and wheezing. Primary symptoms comments: Patient with hx of asthma, coughing and wheezing for 5 days.. This is a recurrent problem. The current episode started in the past 7 days (5 days). The problem occurs constantly. The problem has been gradually worsening. The cough is non-productive. Pertinent negatives include no chest pain, fever or nasal congestion. Associated symptoms comments: Felt warm Monday night and was sweaty but did not check her temp.Marland Kitchen. Her symptoms are aggravated by exercise and change in weather. Her symptoms are alleviated by beta-agonist (Rest). Improvement on treatment: She has been using albuterol every 4 hours for few days. Risk factors: No sick contact, not around smoker. Her past medical history is significant for asthma.  Last use of albuterol was early this morning. She is supposed to be on Symbicort but she is out. She uses it occasionally, not daily.  Current Outpatient Medications on File Prior to Visit  Medication Sig Dispense Refill  . albuterol (PROVENTIL HFA;VENTOLIN HFA) 108 (90 Base) MCG/ACT inhaler Inhale 2 puffs into the lungs every 4 (four) hours as needed for wheezing or shortness of breath. 1 Inhaler 0  . amLODipine (NORVASC) 5 MG tablet Take 1 tablet (5 mg total) by mouth daily. 90 tablet 3  . amoxicillin (AMOXIL) 500 MG capsule Take 2 capsules (1,000 mg total) by mouth 3 (three) times daily. (Patient not taking: Reported on 02/10/2016) 30 capsule 0  . aspirin-acetaminophen-caffeine (EXCEDRIN MIGRAINE) 250-250-65 MG tablet Take 2 tablets by mouth every 6 (six) hours as needed for headache.    . butalbital-acetaminophen-caffeine (FIORICET, ESGIC) 50-325-40 MG tablet Take 1 tablet by mouth every 6 (six) hours as needed for headache. 30 tablet 1  . cyclobenzaprine (FLEXERIL) 5 MG tablet Take 2 tablets (10 mg total) by  mouth 3 (three) times daily as needed for muscle spasms. (Patient not taking: Reported on 02/10/2016) 15 tablet 0  . diazepam (VALIUM) 5 MG tablet Take 1 tablet (5 mg total) by mouth every 6 (six) hours as needed for anxiety (spasms). (Patient not taking: Reported on 02/10/2016) 10 tablet 0  . eletriptan (RELPAX) 40 MG tablet Take 1 tablet (40 mg total) by mouth as needed for migraine or headache. May repeat in 2 hours if headache persists or recurs. 10 tablet 0  . hydrochlorothiazide (HYDRODIURIL) 25 MG tablet TAKE 1 TABLET (25 MG TOTAL) BY MOUTH DAILY. 60 tablet 5  . HYDROcodone-homatropine (HYCODAN) 5-1.5 MG/5ML syrup Take 5 mLs by mouth every 8 (eight) hours as needed for cough. (Patient not taking: Reported on 02/10/2016) 120 mL 0  . ibuprofen (ADVIL,MOTRIN) 200 MG tablet Take 800 mg by mouth every 6 (six) hours as needed for headache.    . megestrol (MEGACE) 40 MG tablet Take 1 tablet (40 mg total) by mouth 2 (two) times daily. Take 1 tablet twice a day for bleeding. If needed, can take 2 tablets twice a day (Patient not taking: Reported on 01/11/2017) 60 tablet 3  . methocarbamol (ROBAXIN) 500 MG tablet Take 1 tablet (500 mg total) by mouth 2 (two) times daily. (Patient not taking: Reported on 01/11/2017) 20 tablet 0  . naproxen (NAPROSYN) 500 MG tablet Take 1 tablet (500 mg total) by mouth 2 (two) times daily with a meal. (Patient not taking: Reported on 02/10/2016) 16 tablet 0  . nystatin ointment (MYCOSTATIN) Apply 1 application topically 2 (  two) times daily. (Patient not taking: Reported on 02/10/2016) 30 g 0  . predniSONE (DELTASONE) 50 MG tablet Take 1 tablet (50 mg total) by mouth daily. (Patient not taking: Reported on 01/11/2017) 3 tablet 0  . prochlorperazine (COMPAZINE) 5 MG tablet Take 1-2 tablets (5-10 mg total) by mouth every 8 (eight) hours as needed for nausea or vomiting. (Patient not taking: Reported on 01/11/2017) 30 tablet 0  . SUMAtriptan (IMITREX) 100 MG tablet Take 1 tablet (100  mg total) by mouth once. May repeat in 2 hours if headache persists or recurs. 10 tablet 2   No current facility-administered medications on file prior to visit.    Past Medical History:  Diagnosis Date  . Allergy-induced asthma    prn inhaler  . History of anemia    no current problem, per pt.  . Hypertension    under control with med., has been on med. x 1 yr.  . Migraines   . Pyogenic granuloma 12/2013   right ring finger  . Seasonal allergies       Review of Systems  Constitutional: Negative for fever.  Respiratory: Positive for cough, shortness of breath and wheezing.   Cardiovascular: Negative.  Negative for chest pain.  Gastrointestinal: Negative.   Genitourinary: Negative.   All other systems reviewed and are negative.      Objective:   Physical Exam  Constitutional: She is oriented to person, place, and time. She appears well-developed. No distress.  Cardiovascular: Normal rate, regular rhythm and normal heart sounds.  No murmur heard. Pulmonary/Chest: Effort normal. She has wheezes.      Neurological: She is alert and oriented to person, place, and time.  Nursing note and vitals reviewed.      Assessment:     Mild Asthma Exacerbation    Plan:     Check problem list.  More than 50% of this 30 min face to face encounter was spent of acute care management of asthma exacerbation and coordination of care.

## 2017-03-30 NOTE — Assessment & Plan Note (Addendum)
Clinically stable. O2 Sat 94% on RA. IM 80 mg Solumedrol shot given x 1. Duoneb treatment given. Wheezing improved after treatment. I refilled Solumedrol and advised she is to use it daily as instructed and not prn. Oral steroid given for 5 more days. Return precaution discussed.

## 2017-04-26 ENCOUNTER — Encounter: Payer: Self-pay | Admitting: Family Medicine

## 2017-05-10 ENCOUNTER — Other Ambulatory Visit: Payer: Self-pay

## 2017-05-10 ENCOUNTER — Encounter (HOSPITAL_BASED_OUTPATIENT_CLINIC_OR_DEPARTMENT_OTHER): Payer: Self-pay | Admitting: Emergency Medicine

## 2017-05-10 ENCOUNTER — Emergency Department (HOSPITAL_BASED_OUTPATIENT_CLINIC_OR_DEPARTMENT_OTHER)
Admission: EM | Admit: 2017-05-10 | Discharge: 2017-05-10 | Disposition: A | Discharge status: Home / Self Care | Payer: Commercial Managed Care - PPO | Attending: Emergency Medicine | Admitting: Emergency Medicine

## 2017-05-10 DIAGNOSIS — I10 Essential (primary) hypertension: Secondary | ICD-10-CM | POA: Insufficient documentation

## 2017-05-10 DIAGNOSIS — L03011 Cellulitis of right finger: Secondary | ICD-10-CM | POA: Insufficient documentation

## 2017-05-10 DIAGNOSIS — M79644 Pain in right finger(s): Secondary | ICD-10-CM | POA: Diagnosis present

## 2017-05-10 DIAGNOSIS — J45909 Unspecified asthma, uncomplicated: Secondary | ICD-10-CM | POA: Insufficient documentation

## 2017-05-10 DIAGNOSIS — Z79899 Other long term (current) drug therapy: Secondary | ICD-10-CM | POA: Insufficient documentation

## 2017-05-10 HISTORY — DX: Obesity, unspecified: E66.9

## 2017-05-10 MED ORDER — DOXYCYCLINE HYCLATE 100 MG PO CAPS: 100.0000 mg | ORAL_CAPSULE | Freq: Two times a day (BID) | ORAL | 0 refills | Status: DC

## 2017-05-10 MED ORDER — PROCHLORPERAZINE MALEATE 10 MG PO TABS: 10.0000 mg | ORAL_TABLET | Freq: Two times a day (BID) | ORAL | 0 refills | Status: DC | PRN

## 2017-05-10 MED FILL — PROCHLORPERAZINE 10 MG TAB: 10 | 5 days supply | Qty: 10 | Fill #0

## 2017-05-10 MED FILL — DOXYCYCLINE HYCLATE 100 MG: 100 | 7 days supply | Qty: 14 | Fill #0

## 2017-05-10 NOTE — ED Triage Notes (Signed)
Swelling, pain, and tenderness to tip of right middle finger x3 days.  No known injury.

## 2017-05-10 NOTE — ED Provider Notes (Signed)
[facility] EMERGENCY DEPARTMENT Provider Note   CSN: [id number] Arrival date & time: 05/10/17  0730     History   Chief Complaint Chief Complaint  Patient presents with  . finger swelling    HPI Mary GrinderGaye Burandt is a 42 y.o. female.  The history is provided by the patient. No language interpreter was used.    Mary Hanson is a 42 y.o. female who presents to the Emergency Department complaining of finger swelling.  She reports 3 days of pain and swelling to the tip of her right third digit.  She has no known history of injury or trauma to the hand.  She is right-hand dominant.  She feels that the most pain is near the nailbed but does wrap around to the pulp of her digit.  No fevers, nausea, vomiting.  No prior similar symptoms.  No history of diabetes.  She has tried Motrin at home with no significant change in her symptoms.  Symptoms are moderate and constant nature.  Past Medical History:  Diagnosis Date  . Allergy-induced asthma    prn inhaler  . History of anemia    no current problem, per pt.  . Hypertension    under control with med., has been on med. x 1 yr.  . Migraines   . Obesity   . Pyogenic granuloma 12/2013   right ring finger  . Seasonal allergies     Patient Active Problem List   Diagnosis Date Noted  . Nonintractable headache 01/14/2017  . Fatigue 02/10/2016  . Solitary pulmonary nodule 02/10/2016  . Shortness of breath 12/31/2015  . Menorrhagia 05/31/2015  . Asthma with acute exacerbation 03/01/2015  . Peripheral neuralgia 11/11/2014  . Enlarged thyroid 04/03/2013  . Unspecified vitamin D deficiency 07/29/2012  . External hemorrhoid 09/06/2011  . Essential hypertension 12/29/2010  . Morbid obesity (HCC) 12/29/2010  . Mild asthma exacerbation 12/29/2010  . Allergic rhinitis 12/29/2010  . Migraine headache 12/29/2010    Past Surgical History:  Procedure Laterality Date  . CERVICAL CONE BIOPSY  age 42  . CHOLECYSTECTOMY  2006 or 2007    . DILATION AND CURETTAGE OF UTERUS    . DILATION AND EVACUATION  age 42  . MASS EXCISION Right 01/16/2014   Procedure: EXCISION PYOGENIC GRANULOMA FROM RIGHT RING FINGER;  Surgeon: Dairl PonderMatthew Weingold, MD;  Location: [facility];  Service: Orthopedics;  Laterality: Right;    OB History    Gravida Para Term Preterm AB Living   1       1 0   SAB TAB Ectopic Multiple Live Births     1             Home Medications    Prior to Admission medications   Medication Sig Start Date End Date Taking? Authorizing Provider  albuterol (PROVENTIL HFA;VENTOLIN HFA) 108 (90 Base) MCG/ACT inhaler Inhale 2 puffs into the lungs every 4 (four) hours as needed for wheezing or shortness of breath. 01/12/17   Casey BurkittFitzgerald, Hillary Moen, MD  amLODipine (NORVASC) 5 MG tablet Take 1 tablet (5 mg total) by mouth daily. 01/12/17   Casey BurkittFitzgerald, Hillary Moen, MD  amoxicillin (AMOXIL) 500 MG capsule Take 2 capsules (1,000 mg total) by mouth 3 (three) times daily. Patient not taking: Reported on 02/10/2016 12/31/15   Marquette SaaLancaster, Abigail Joseph, MD  aspirin-acetaminophen-caffeine [facility](EXCEDRIN MIGRAINE) [id number] MG tablet Take 2 tablets by mouth every 6 (six) hours as needed for headache.    [provider]  budesonide-formoterol (SYMBICORT) 160-4.5  MCG/ACT inhaler Inhale 2 puffs into the lungs 2 (two) times daily. 03/30/17   Doreene Eland, MD  butalbital-acetaminophen-caffeine (FIORICET, ESGIC) [phone] MG tablet Take 1 tablet by mouth every 6 (six) hours as needed for headache. 01/12/17   Casey Burkitt, MD  cyclobenzaprine (FLEXERIL) 5 MG tablet Take 2 tablets (10 mg total) by mouth 3 (three) times daily as needed for muscle spasms. Patient not taking: Reported on 02/10/2016 01/16/16   Arvilla Meres L, PA-C  diazepam (VALIUM) 5 MG tablet Take 1 tablet (5 mg total) by mouth every 6 (six) hours as needed for anxiety (spasms). Patient not taking: Reported on 02/10/2016 09/20/15   Melene Plan, DO   doxycycline (VIBRAMYCIN) 100 MG capsule Take 1 capsule (100 mg total) by mouth 2 (two) times daily. 05/10/17   Tilden Fossa, MD  eletriptan (RELPAX) 40 MG tablet Take 1 tablet (40 mg total) by mouth as needed for migraine or headache. May repeat in 2 hours if headache persists or recurs. 11/15/14   Anson Fret, MD  hydrochlorothiazide (HYDRODIURIL) 25 MG tablet TAKE 1 TABLET (25 MG TOTAL) BY MOUTH DAILY. 09/30/16   Reva Bores, MD  HYDROcodone-homatropine [facility]) 5-1.5 MG/5ML syrup Take 5 mLs by mouth every 8 (eight) hours as needed for cough. Patient not taking: Reported on 02/10/2016 12/31/15   Marquette Saa, MD  ibuprofen (ADVIL,MOTRIN) 200 MG tablet Take 800 mg by mouth every 6 (six) hours as needed for headache.    [provider]  megestrol (MEGACE) 40 MG tablet Take 1 tablet (40 mg total) by mouth 2 (two) times daily. Take 1 tablet twice a day for bleeding. If needed, can take 2 tablets twice a day Patient not taking: Reported on 01/11/2017 05/31/15   Reva Bores, MD  methocarbamol (ROBAXIN) 500 MG tablet Take 1 tablet (500 mg total) by mouth 2 (two) times daily. Patient not taking: Reported on 01/11/2017 09/09/16   Elson Areas, PA-C  naproxen (NAPROSYN) 500 MG tablet Take 1 tablet (500 mg total) by mouth 2 (two) times daily with a meal. Patient not taking: Reported on 02/10/2016 01/16/16   Deborha Payment, PA-C  nystatin ointment (MYCOSTATIN) Apply 1 application topically 2 (two) times daily. Patient not taking: Reported on 02/10/2016 05/31/15   Reva Bores, MD  prochlorperazine (COMPAZINE) 10 MG tablet Take 1 tablet (10 mg total) by mouth 2 (two) times daily as needed for nausea or vomiting. 05/10/17   Tilden Fossa, MD  SUMAtriptan (IMITREX) 100 MG tablet Take 1 tablet (100 mg total) by mouth once. May repeat in 2 hours if headache persists or recurs. 01/12/17 01/12/17  Casey Burkitt, MD    Family History Family History  Problem Relation Age of  Onset  . Diabetes type II Father   . Hypertension Father   . Hypertension Mother   . Goiter Sister 16       identical twin  . Asthma Sister   . Hypertension Sister   . Stroke Maternal Grandfather   . Diabetes Paternal Grandfather   . Heart disease Paternal Grandfather     Social History Social History   Tobacco Use  . Smoking status: Never Smoker  . Smokeless tobacco: Never Used  Substance Use Topics  . Alcohol use: No    Alcohol/week: 0.0 oz    Comment: Wine once a year   . Drug use: No     Allergies   Augmentin [amoxicillin-pot clavulanate]; Lisinopril; and Zofran [ondansetron hcl]  Review of Systems Review of Systems  All other systems reviewed and are negative.    Physical Exam Updated Vital Signs BP (!) 160/84 (BP Location: Left Arm)   Pulse 66   Temp (!) 97.3 F (36.3 C) (Oral)   Resp 16   Ht 5\' 6"  (1.676 m)   Wt (!) 138.8 kg (306 lb)   LMP 04/24/2017 (Approximate)   SpO2 98%   BMI 49.39 kg/m   Physical Exam  Constitutional: She is oriented to person, place, and time. She appears well-developed and well-nourished. No distress.  HENT:  Head: Normocephalic and atraumatic.  Cardiovascular: Normal rate and regular rhythm.  Pulmonary/Chest: Effort normal. No respiratory distress.  Musculoskeletal:  2+ radial pulses.  There is mild swelling and tenderness to the distal portion of the right third digit.  There is no tenderness or swelling over the IP joints.  Flexion and extension are intact throughout the hand.  There is no focal fluctuance.  Neurological: She is alert and oriented to person, place, and time.  5 out of 5 grip strength  Skin: Skin is warm and dry. Capillary refill takes less than 2 seconds.  Psychiatric: She has a normal mood and affect. Her behavior is normal.  Nursing note and vitals reviewed.    ED Treatments / Results  Labs (all labs ordered are listed, but only abnormal results are displayed) Labs Reviewed - No data to  display  EKG  EKG Interpretation None       Radiology No results found.  Procedures Procedures (including critical care time)  Medications Ordered in ED Medications - No data to display   Initial Impression / Assessment and Plan / ED Course  I have reviewed the triage vital signs and the nursing notes.  Pertinent labs & imaging results that were available during my care of the patient were reviewed by me and considered in my medical decision making (see chart for details).     Patient here for evaluation of swelling and pain to the right third digit.  History and exam is concerning for developing paronychia but there is no clear purulent pocket to drain.  Discussed with patient performing a digital block and I&D as there may be an abscess present that is not evident on exam.  Patient declines I&D at this time.  There is no clinical evidence of felon or flexor tenosynovitis.  Discussed with patient home care for very early paronychia with warm soaks and antibiotics.  Discussed importance of very close return precautions.  Final Clinical Impressions(s) / ED Diagnoses   Final diagnoses:  Paronychia of finger of right hand    ED Discharge Orders        Ordered    doxycycline (VIBRAMYCIN) 100 MG capsule  2 times daily     05/10/17 0756    prochlorperazine (COMPAZINE) 10 MG tablet  2 times daily PRN     05/10/17 0756       Tilden Fossa, MD 05/10/17 [phone]

## 2017-05-13 ENCOUNTER — Encounter (HOSPITAL_COMMUNITY): Payer: Self-pay | Admitting: Emergency Medicine

## 2017-05-13 ENCOUNTER — Emergency Department (HOSPITAL_COMMUNITY)
Admission: EM | Admit: 2017-05-13 | Discharge: 2017-05-13 | Disposition: A | Discharge status: Home / Self Care | Payer: Commercial Managed Care - PPO | Attending: Emergency Medicine | Admitting: Emergency Medicine

## 2017-05-13 DIAGNOSIS — R51 Headache: Secondary | ICD-10-CM | POA: Diagnosis present

## 2017-05-13 DIAGNOSIS — G43009 Migraine without aura, not intractable, without status migrainosus: Secondary | ICD-10-CM | POA: Diagnosis not present

## 2017-05-13 DIAGNOSIS — Z79899 Other long term (current) drug therapy: Secondary | ICD-10-CM | POA: Insufficient documentation

## 2017-05-13 DIAGNOSIS — H53149 Visual discomfort, unspecified: Secondary | ICD-10-CM | POA: Insufficient documentation

## 2017-05-13 DIAGNOSIS — R110 Nausea: Secondary | ICD-10-CM | POA: Diagnosis not present

## 2017-05-13 MED ORDER — SODIUM CHLORIDE 0.9 % IV BOLUS (SEPSIS)
500.0000 mL | Freq: Once | INTRAVENOUS | Status: AC
  Administered 2017-05-13: 500 mL via INTRAVENOUS

## 2017-05-13 MED ORDER — KETOROLAC TROMETHAMINE 30 MG/ML IJ SOLN
30.0000 mg | Freq: Once | INTRAMUSCULAR | Status: AC
  Administered 2017-05-13: 30 mg via INTRAVENOUS
  Filled 2017-05-13: qty 1

## 2017-05-13 MED ORDER — DIPHENHYDRAMINE HCL 25 MG PO CAPS
25.0000 mg | ORAL_CAPSULE | Freq: Once | ORAL | Status: AC
  Administered 2017-05-13: 25 mg via ORAL
  Filled 2017-05-13: qty 1

## 2017-05-13 MED ORDER — METOCLOPRAMIDE HCL 5 MG/ML IJ SOLN
10.0000 mg | Freq: Once | INTRAMUSCULAR | Status: AC
  Administered 2017-05-13: 10 mg via INTRAVENOUS
  Filled 2017-05-13: qty 2

## 2017-05-13 NOTE — ED Provider Notes (Signed)
[facility]   CSN: [id number] Arrival date & time: 05/13/17  1831     History   Chief Complaint Chief Complaint  Patient presents with  . Headache    HPI Mary Hanson is a 42 y.o. female with a history of migraines who presents to the emergency department today for headache.  Patient is followed by Dr. Lucia Gaskins of [facility] neurology with a diagnosis of migraines.  Patient notes that approximately 10 AM this morning she awoke with a right-sided throbbing headache that is since spread to both sides with associated photophobia, phonophobia, and nausea.  There is no preceding aura.  The patient has tried Imitrex and Compazine for her symptoms with relief of her nausea.  Headache persists.  States sometimes if Imitrex is not where she has come to the emergency department for migraine cocktail.  Feels this is similar to previous migraines in the past.  She denies any fever, syncope, head trauma, visual changes, neck stiffness.  No thunderclap onset.  Patient notes that she has reflux at home but did not want to take this as it "knocks her out".  HPI  Past Medical History:  Diagnosis Date  . Allergy-induced asthma    prn inhaler  . History of anemia    no current problem, per pt.  . Hypertension    under control with med., has been on med. x 1 yr.  . Migraines   . Obesity   . Pyogenic granuloma 12/2013   right ring finger  . Seasonal allergies     Patient Active Problem List   Diagnosis Date Noted  . Nonintractable headache 01/14/2017  . Fatigue 02/10/2016  . Solitary pulmonary nodule 02/10/2016  . Shortness of breath 12/31/2015  . Menorrhagia 05/31/2015  . Asthma with acute exacerbation 03/01/2015  . Peripheral neuralgia 11/11/2014  . Enlarged thyroid 04/03/2013  . Unspecified vitamin D deficiency 07/29/2012  . External hemorrhoid 09/06/2011  . Essential hypertension 12/29/2010  . Morbid obesity (HCC) 12/29/2010  . Mild asthma  exacerbation 12/29/2010  . Allergic rhinitis 12/29/2010  . Migraine headache 12/29/2010    Past Surgical History:  Procedure Laterality Date  . CERVICAL CONE BIOPSY  age 33  . CHOLECYSTECTOMY  2006 or 2007  . DILATION AND CURETTAGE OF UTERUS    . DILATION AND EVACUATION  age 17  . MASS EXCISION Right 01/16/2014   Procedure: EXCISION PYOGENIC GRANULOMA FROM RIGHT RING FINGER;  Surgeon: Dairl Ponder, MD;  Location: [facility];  Service: Orthopedics;  Laterality: Right;    OB History    Gravida Para Term Preterm AB Living   1       1 0   SAB TAB Ectopic Multiple Live Births     1             Home Medications    Prior to Admission medications   Medication Sig Start Date End Date Taking? Authorizing Provider  albuterol (PROVENTIL HFA;VENTOLIN HFA) 108 (90 Base) MCG/ACT inhaler Inhale 2 puffs into the lungs every 4 (four) hours as needed for wheezing or shortness of breath. 01/12/17   Casey Burkitt, MD  amLODipine (NORVASC) 5 MG tablet Take 1 tablet (5 mg total) by mouth daily. 01/12/17   Casey Burkitt, MD  amoxicillin (AMOXIL) 500 MG capsule Take 2 capsules (1,000 mg total) by mouth 3 (three) times daily. Patient not taking: Reported on 02/10/2016 12/31/15   Marquette Saa, MD  aspirin-acetaminophen-caffeine [facility] MIGRAINE) [phone] MG  tablet Take 2 tablets by mouth every 6 (six) hours as needed for headache.    [provider]  budesonide-formoterol (SYMBICORT) 160-4.5 MCG/ACT inhaler Inhale 2 puffs into the lungs 2 (two) times daily. 03/30/17   Doreene ElandEniola, Kehinde T, MD  butalbital-acetaminophen-caffeine (FIORICET, ESGIC) [phone] MG tablet Take 1 tablet by mouth every 6 (six) hours as needed for headache. 01/12/17   Casey BurkittFitzgerald, Hillary Moen, MD  cyclobenzaprine (FLEXERIL) 5 MG tablet Take 2 tablets (10 mg total) by mouth 3 (three) times daily as needed for muscle spasms. Patient not taking: Reported on 02/10/2016 01/16/16    Arvilla MeresMeyer, Ashley L, PA-C  diazepam (VALIUM) 5 MG tablet Take 1 tablet (5 mg total) by mouth every 6 (six) hours as needed for anxiety (spasms). Patient not taking: Reported on 02/10/2016 09/20/15   Melene PlanFloyd, Dan, DO  doxycycline (VIBRAMYCIN) 100 MG capsule Take 1 capsule (100 mg total) by mouth 2 (two) times daily. 05/10/17   Tilden Fossaees, Elizabeth, MD  eletriptan (RELPAX) 40 MG tablet Take 1 tablet (40 mg total) by mouth as needed for migraine or headache. May repeat in 2 hours if headache persists or recurs. 11/15/14   Anson FretAhern, Antonia B, MD  hydrochlorothiazide (HYDRODIURIL) 25 MG tablet TAKE 1 TABLET (25 MG TOTAL) BY MOUTH DAILY. 09/30/16   Reva BoresPratt, Tanya S, MD  HYDROcodone-homatropine [facility](HYCODAN) 5-1.5 MG/5ML syrup Take 5 mLs by mouth every 8 (eight) hours as needed for cough. Patient not taking: Reported on 02/10/2016 12/31/15   Marquette SaaLancaster, Abigail Joseph, MD  ibuprofen (ADVIL,MOTRIN) 200 MG tablet Take 800 mg by mouth every 6 (six) hours as needed for headache.    [provider]  megestrol (MEGACE) 40 MG tablet Take 1 tablet (40 mg total) by mouth 2 (two) times daily. Take 1 tablet twice a day for bleeding. If needed, can take 2 tablets twice a day Patient not taking: Reported on 01/11/2017 05/31/15   Reva BoresPratt, Tanya S, MD  methocarbamol (ROBAXIN) 500 MG tablet Take 1 tablet (500 mg total) by mouth 2 (two) times daily. Patient not taking: Reported on 01/11/2017 09/09/16   Elson AreasSofia, Leslie K, PA-C  naproxen (NAPROSYN) 500 MG tablet Take 1 tablet (500 mg total) by mouth 2 (two) times daily with a meal. Patient not taking: Reported on 02/10/2016 01/16/16   Deborha PaymentMeyer, Ashley L, PA-C  nystatin ointment (MYCOSTATIN) Apply 1 application topically 2 (two) times daily. Patient not taking: Reported on 02/10/2016 05/31/15   Reva BoresPratt, Tanya S, MD  prochlorperazine (COMPAZINE) 10 MG tablet Take 1 tablet (10 mg total) by mouth 2 (two) times daily as needed for nausea or vomiting. 05/10/17   Tilden Fossaees, Elizabeth, MD  SUMAtriptan (IMITREX) 100 MG  tablet Take 1 tablet (100 mg total) by mouth once. May repeat in 2 hours if headache persists or recurs. 01/12/17 01/12/17  Casey BurkittFitzgerald, Hillary Moen, MD    Family History Family History  Problem Relation Age of Onset  . Diabetes type II Father   . Hypertension Father   . Hypertension Mother   . Goiter Sister [id number]       identical twin  . Asthma Sister   . Hypertension Sister   . Stroke Maternal Grandfather   . Diabetes Paternal Grandfather   . Heart disease Paternal Grandfather     Social History Social History   Tobacco Use  . Smoking status: Never Smoker  . Smokeless tobacco: Never Used  Substance Use Topics  . Alcohol use: No    Alcohol/week: 0.0 oz    Comment: Wine once a  year   . Drug use: No     Allergies   Augmentin [amoxicillin-pot clavulanate]; Lisinopril; and Zofran [ondansetron hcl]   Review of Systems Review of Systems  All other systems reviewed and are negative.    Physical Exam Updated Vital Signs BP (!) 156/69 (BP Location: Right Arm)   Pulse 61   Temp 97.9 F (36.6 C) (Oral)   Resp 16   LMP 04/24/2017 (Approximate)   SpO2 99%   Physical Exam  Constitutional: She appears well-developed and well-nourished.  HENT:  Head: Normocephalic and atraumatic.  Right Ear: Tympanic membrane and external ear normal.  Left Ear: Tympanic membrane and external ear normal.  Nose: Nose normal.  Mouth/Throat: Uvula is midline, oropharynx is clear and moist and mucous membranes are normal. No tonsillar exudate.  Scalp TTP   Eyes: Pupils are equal, round, and reactive to light. Right eye exhibits no discharge. Left eye exhibits no discharge. No scleral icterus.  Neck: Trachea normal. Neck supple. No spinous process tenderness present. No neck rigidity. Normal range of motion present.  No nuchal rigidity or meningismus  Cardiovascular: Normal rate, regular rhythm and intact distal pulses.  No murmur heard. Pulses:      Radial pulses are 2+ on the right side, and  2+ on the left side.       Dorsalis pedis pulses are 2+ on the right side, and 2+ on the left side.       Posterior tibial pulses are 2+ on the right side, and 2+ on the left side.  No lower extremity swelling or edema. Calves symmetric in size bilaterally.  Pulmonary/Chest: Effort normal and breath sounds normal. She exhibits no tenderness.  Abdominal: Soft. Bowel sounds are normal. There is no tenderness. There is no rebound and no guarding.  Musculoskeletal: She exhibits no edema.  Right third digit with previous paronychia that appears to have resolved. Soft finger pad and without evidence of felon. Patient with full active and resisted rom of the finger without pain.   Lymphadenopathy:    She has no cervical adenopathy.  Neurological: She is alert.  Mental Status:  Alert, oriented, thought content appropriate, able to give a coherent history. Speech fluent without evidence of aphasia. Able to follow 2 step commands without difficulty.  Cranial Nerves:  II:  Peripheral visual fields grossly normal, pupils equal, round, reactive to light III,IV, VI: ptosis not present, extra-ocular motions intact bilaterally  V,VII: smile symmetric, eyebrows raise symmetric, facial light touch sensation equal VIII: hearing grossly normal to voice  X: uvula elevates symmetrically  XI: bilateral shoulder shrug symmetric and strong XII: midline tongue extension without fassiculations Motor:  Normal tone. 5/5 in upper and lower extremities bilaterally including strong and equal grip strength and dorsiflexion/plantar flexion Sensory: Sensation intact to light touch in all extremities. Negative Romberg.  Deep Tendon Reflexes: 2+ and symmetric in the biceps and patella Cerebellar: normal finger-to-nose with bilateral upper extremities. Normal heel-to -shin balance bilaterally of the lower extremity. No pronator drift.  Gait: normal gait and balance CV: distal pulses palpable throughout   Skin: Skin is warm  and dry. No rash noted. She is not diaphoretic.  Psychiatric: She has a normal mood and affect.  Nursing note and vitals reviewed.    ED Treatments / Results  Labs (all labs ordered are listed, but only abnormal results are displayed) Labs Reviewed - No data to display  EKG  EKG Interpretation None       Radiology No results found.  Procedures Procedures (including critical care time)  Medications Ordered in ED Medications  metoCLOPramide (REGLAN) injection 10 mg (10 mg Intravenous Given 05/13/17 2110)  sodium chloride 0.9 % bolus 500 mL (500 mLs Intravenous New Bag/Given 05/13/17 2108)  ketorolac (TORADOL) 30 MG/ML injection 30 mg (30 mg Intravenous Given 05/13/17 2109)  diphenhydrAMINE (BENADRYL) capsule 25 mg (25 mg Oral Given 05/13/17 2105)     Initial Impression / Assessment and Plan / ED Course  I have reviewed the triage vital signs and the nursing notes.  Pertinent labs & imaging results that were available during my care of the patient were reviewed by me and considered in my medical decision making (see chart for details).     42 year old female presenting to the emergency department today for headache.  Patient has previous diagnosis of migraine by neurologist.  Presentation is patient's typical migraine and not concerning for [facility], [facility], meningitis or temporal arteritis.  Patient is afebrile with no focal neurologic deficits, nuchal rigidity or changes in vision.  After migraine cocktail (Reglan, Toradol, Benadryl) and IV fluid patient has full relief of her migraine and wishes to go home.  Patient is to follow with her PCP versus neurologist.  Strict return precautions discussed.  Patient appears safe for discharge.  Final Clinical Impressions(s) / ED Diagnoses   Final diagnoses:  Migraine without aura and without status migrainosus, not intractable    ED Discharge Orders    None       Princella Pellegrini 05/13/17 2214    Donnetta Hutching, MD 05/16/17  1023

## 2017-05-13 NOTE — ED Triage Notes (Signed)
Per EMS, patient coming from home, c/o headache with nausea. Taking doxy x3 days for finger infection. Denies V/D. Hx migraines. Took Imitrex with no relief.

## 2017-05-13 NOTE — Discharge Instructions (Signed)
You were seen and treated here today for migraine. Follow up with PCP and neurology. If you develop worsening or new concerning symptoms you can return to the emergency department for re-evaluation. Continue taking doxycycline as prescribed.

## 2017-06-07 ENCOUNTER — Encounter (HOSPITAL_COMMUNITY): Payer: Self-pay | Admitting: *Deleted

## 2017-06-07 ENCOUNTER — Inpatient Hospital Stay (HOSPITAL_COMMUNITY)
Admission: AD | Admit: 2017-06-07 | Discharge: 2017-06-07 | Disposition: A | Discharge status: Home / Self Care | Payer: Commercial Managed Care - PPO | Source: Ambulatory Visit | Attending: Obstetrics and Gynecology | Admitting: Obstetrics and Gynecology

## 2017-06-07 DIAGNOSIS — Z880 Allergy status to penicillin: Secondary | ICD-10-CM | POA: Insufficient documentation

## 2017-06-07 DIAGNOSIS — J45909 Unspecified asthma, uncomplicated: Secondary | ICD-10-CM | POA: Insufficient documentation

## 2017-06-07 DIAGNOSIS — Z888 Allergy status to other drugs, medicaments and biological substances status: Secondary | ICD-10-CM | POA: Diagnosis not present

## 2017-06-07 DIAGNOSIS — E669 Obesity, unspecified: Secondary | ICD-10-CM | POA: Insufficient documentation

## 2017-06-07 DIAGNOSIS — Z7982 Long term (current) use of aspirin: Secondary | ICD-10-CM | POA: Diagnosis not present

## 2017-06-07 DIAGNOSIS — B3731 Acute candidiasis of vulva and vagina: Secondary | ICD-10-CM

## 2017-06-07 DIAGNOSIS — I10 Essential (primary) hypertension: Secondary | ICD-10-CM | POA: Diagnosis not present

## 2017-06-07 DIAGNOSIS — B373 Candidiasis of vulva and vagina: Secondary | ICD-10-CM | POA: Diagnosis not present

## 2017-06-07 DIAGNOSIS — Z7951 Long term (current) use of inhaled steroids: Secondary | ICD-10-CM | POA: Diagnosis not present

## 2017-06-07 DIAGNOSIS — N898 Other specified noninflammatory disorders of vagina: Secondary | ICD-10-CM | POA: Diagnosis present

## 2017-06-07 DIAGNOSIS — Z9049 Acquired absence of other specified parts of digestive tract: Secondary | ICD-10-CM | POA: Insufficient documentation

## 2017-06-07 DIAGNOSIS — Z79899 Other long term (current) drug therapy: Secondary | ICD-10-CM | POA: Insufficient documentation

## 2017-06-07 LAB — URINALYSIS, ROUTINE W REFLEX MICROSCOPIC
BILIRUBIN URINE: NEGATIVE
GLUCOSE, UA: NEGATIVE mg/dL
HGB URINE DIPSTICK: NEGATIVE
KETONES UR: NEGATIVE mg/dL
NITRITE: NEGATIVE
PH: 6 (ref 5.0–8.0)
PROTEIN: NEGATIVE mg/dL
Specific Gravity, Urine: 1.023 (ref 1.005–1.030)

## 2017-06-07 LAB — WET PREP, GENITAL
Clue Cells Wet Prep HPF POC: NONE SEEN
SPERM: NONE SEEN
Trich, Wet Prep: NONE SEEN

## 2017-06-07 LAB — POCT PREGNANCY, URINE: Preg Test, Ur: NEGATIVE

## 2017-06-07 MED ORDER — FLUCONAZOLE 150 MG PO TABS: 150.0000 mg | ORAL_TABLET | Freq: Every day | ORAL | 0 refills | Status: DC

## 2017-06-07 MED ORDER — FLUCONAZOLE 150 MG PO TABS
150.0000 mg | ORAL_TABLET | Freq: Once | ORAL | Status: AC
  Administered 2017-06-07: 150 mg via ORAL
  Filled 2017-06-07: qty 1

## 2017-06-07 MED FILL — FLUCONAZOLE 150 MG TABLET: 150 | 1 days supply | Qty: 1 | Fill #0

## 2017-06-07 NOTE — MAU Note (Signed)
PT SAYS SHE HAS VAG PRESSURE- STARTED   LAST NIGHT , IRRITATION AND ITCHING  STARTED  Tuesday.Marland Kitchen.   HAS INCREASE  FREQ.

## 2017-06-07 NOTE — Discharge Instructions (Signed)
Vaginal Yeast infection, Adult °Vaginal yeast infection is a condition that causes soreness, swelling, and redness (inflammation) of the vagina. It also causes vaginal discharge. This is a common condition. Some women get this infection frequently. °What are the causes? °This condition is caused by a change in the normal balance of the yeast (candida) and bacteria that live in the vagina. This change causes an overgrowth of yeast, which causes the inflammation. °What increases the risk? °This condition is more likely to develop in: °· Women who take antibiotic medicines. °· Women who have diabetes. °· Women who take birth control pills. °· Women who are pregnant. °· Women who douche often. °· Women who have a weak defense (immune) system. °· Women who have been taking steroid medicines for a long time. °· Women who frequently wear tight clothing. ° °What are the signs or symptoms? °Symptoms of this condition include: °· White, thick vaginal discharge. °· Swelling, itching, redness, and irritation of the vagina. The lips of the vagina (vulva) may be affected as well. °· Pain or a burning feeling while urinating. °· Pain during sex. ° °How is this diagnosed? °This condition is diagnosed with a medical history and physical exam. This will include a pelvic exam. Your health care provider will examine a sample of your vaginal discharge under a microscope. Your health care provider may send this sample for testing to confirm the diagnosis. °How is this treated? °This condition is treated with medicine. Medicines may be over-the-counter or prescription. You may be told to use one or more of the following: °· Medicine that is taken orally. °· Medicine that is applied as a cream. °· Medicine that is inserted directly into the vagina (suppository). ° °Follow these instructions at home: °· Take or apply over-the-counter and prescription medicines only as told by your health care provider. °· Do not have sex until your health  care provider has approved. Tell your sex partner that you have a yeast infection. That person should go to his or her health care provider if he or she develops symptoms. °· Do not wear tight clothes, such as pantyhose or tight pants. °· Avoid using tampons until your health care provider approves. °· Eat more yogurt. This may help to keep your yeast infection from returning. °· Try taking a sitz bath to help with discomfort. This is a warm water bath that is taken while you are sitting down. The water should only come up to your hips and should cover your buttocks. Do this 3-4 times per day or as told by your health care provider. °· Do not douche. °· Wear breathable, cotton underwear. °· If you have diabetes, keep your blood sugar levels under control. °Contact a health care provider if: °· You have a fever. °· Your symptoms go away and then return. °· Your symptoms do not get better with treatment. °· Your symptoms get worse. °· You have new symptoms. °· You develop blisters in or around your vagina. °· You have blood coming from your vagina and it is not your menstrual period. °· You develop pain in your abdomen. °This information is not intended to replace advice given to you by your health care provider. Make sure you discuss any questions you have with your health care provider. °Document Released: 01/18/2005 Document Revised: 09/22/2015 Document Reviewed: 10/12/2014 °Elsevier Interactive Patient Education © 2018 [employer] ° °In late 2019, the [facility] will be moving to the [place] campus. At that time, the MAU (Maternity   Admissions Unit), where you are being seen today, will no longer take care of non-pregnant patients. We strongly encourage you to find a doctor's office before that time, so that you can be seen with any GYN concerns, like vaginal discharge, urinary tract infection, etc.. in a timely manner. ° °In order to make an office visit more convenient, the [facility] will be offering evening hours with same-day appointments, walk-in appointments and scheduled appointments available during this time. ° °[facility] Hours: °Monday - 8am - 7:30 pm with walk-in between 4pm- 7:30 pm °Tuesday - 8 am - 5 pm (starting 07/24/17 we will be open late and accepting walk-ins from 4pm - 7:30pm) °Wednesday - 8 am - 5 pm (starting 10/24/17 we will be open late and accepting walk-ins from 4pm - 7:30pm) °Thursday 8 am - 5 pm (starting 01/24/18 we will be open late and accepting walk-ins from 4pm - 7:30pm) °Friday 8 am - 5 pm ° °For an appointment please call the [facility] at [phone] ° °For urgent needs, [facility] is also available for management of urgent GYN complaints such as vaginal discharge or urinary tract infections. ° ° ° ° ° ° °

## 2017-06-07 NOTE — MAU Provider Note (Signed)
History     CSN: [id number]  Arrival date and time: 06/07/17 0416   First Provider Initiated Contact with Patient 06/07/17 0532      Chief Complaint  Patient presents with  . Vaginal Discharge  . Vaginitis   HPI Mary Hanson is a 42 y.o. non pregnant female who presents with vaginal irritation and discharge. Symptoms began Tuesday. Reports thick white discharge and intense internal itching. Has not treated symptoms. Finished doxycycline last week for a skin infection. Also reports pelvic/vaginal pressure since last night and urinary frequency. Denies abdominal pain, dysuria, vaginal bleeding, or hematuria. She is sexually active with 1 partner x 5 years and occasionally uses condoms.    Past Medical History:  Diagnosis Date  . Allergy-induced asthma    prn inhaler  . History of anemia    no current problem, per pt.  . Hypertension    under control with med., has been on med. x 1 yr.  . Migraines   . Obesity   . Pyogenic granuloma 12/2013   right ring finger  . Seasonal allergies     Past Surgical History:  Procedure Laterality Date  . CERVICAL CONE BIOPSY  age 30  . CHOLECYSTECTOMY  2006 or 2007  . DILATION AND CURETTAGE OF UTERUS    . DILATION AND EVACUATION  age 23  . MASS EXCISION Right 01/16/2014   Procedure: EXCISION PYOGENIC GRANULOMA FROM RIGHT RING FINGER;  Surgeon: Dairl Ponder, MD;  Location: [facility];  Service: Orthopedics;  Laterality: Right;    Family History  Problem Relation Age of Onset  . Diabetes type II Father   . Hypertension Father   . Hypertension Mother   . Goiter Sister 16       identical twin  . Asthma Sister   . Hypertension Sister   . Stroke Maternal Grandfather   . Diabetes Paternal Grandfather   . Heart disease Paternal Grandfather     Social History   Tobacco Use  . Smoking status: Never Smoker  . Smokeless tobacco: Never Used  Substance Use Topics  . Alcohol use: No    Alcohol/week: 0.0 oz    Comment:  Wine once a year   . Drug use: No    Allergies:  Allergies  Allergen Reactions  . Augmentin [Amoxicillin-Pot Clavulanate] Nausea And Vomiting  . Lisinopril Swelling    Lip swelling/Angioedema  . Zofran [Ondansetron Hcl] Other (See Comments)    Severe headache    Medications Prior to Admission  Medication Sig Dispense Refill Last Dose  . albuterol (PROVENTIL HFA;VENTOLIN HFA) 108 (90 Base) MCG/ACT inhaler Inhale 2 puffs into the lungs every 4 (four) hours as needed for wheezing or shortness of breath. 1 Inhaler 0 06/06/2017 at Unknown time  . amLODipine (NORVASC) 5 MG tablet Take 1 tablet (5 mg total) by mouth daily. 90 tablet 3 06/06/2017 at Unknown time  . aspirin-acetaminophen-caffeine (EXCEDRIN MIGRAINE) 250-250-65 MG tablet Take 2 tablets by mouth every 6 (six) hours as needed for headache.   Past Month at Unknown time  . budesonide-formoterol (SYMBICORT) 160-4.5 MCG/ACT inhaler Inhale 2 puffs into the lungs 2 (two) times daily. 1 Inhaler 0 06/06/2017 at Unknown time  . butalbital-acetaminophen-caffeine (FIORICET, ESGIC) 50-325-40 MG tablet Take 1 tablet by mouth every 6 (six) hours as needed for headache. 30 tablet 1 Past Month at Unknown time  . doxycycline (VIBRAMYCIN) 100 MG capsule Take 1 capsule (100 mg total) by mouth 2 (two) times daily. 14 capsule 0 Past Month  at Unknown time  . eletriptan (RELPAX) 40 MG tablet Take 1 tablet (40 mg total) by mouth as needed for migraine or headache. May repeat in 2 hours if headache persists or recurs. 10 tablet 0 Past Month at Unknown time  . hydrochlorothiazide (HYDRODIURIL) 25 MG tablet TAKE 1 TABLET (25 MG TOTAL) BY MOUTH DAILY. 60 tablet 5 06/06/2017 at Unknown time  . ibuprofen (ADVIL,MOTRIN) 200 MG tablet Take 800 mg by mouth every 6 (six) hours as needed for headache.   Past Month at Unknown time  . prochlorperazine (COMPAZINE) 10 MG tablet Take 1 tablet (10 mg total) by mouth 2 (two) times daily as needed for nausea or vomiting. 10 tablet 0  Past Month at Unknown time  . amoxicillin (AMOXIL) 500 MG capsule Take 2 capsules (1,000 mg total) by mouth 3 (three) times daily. (Patient not taking: Reported on 02/10/2016) 30 capsule 0 Not Taking  . cyclobenzaprine (FLEXERIL) 5 MG tablet Take 2 tablets (10 mg total) by mouth 3 (three) times daily as needed for muscle spasms. (Patient not taking: Reported on 02/10/2016) 15 tablet 0 Not Taking  . diazepam (VALIUM) 5 MG tablet Take 1 tablet (5 mg total) by mouth every 6 (six) hours as needed for anxiety (spasms). (Patient not taking: Reported on 02/10/2016) 10 tablet 0 Not Taking  . HYDROcodone-homatropine (HYCODAN) 5-1.5 MG/5ML syrup Take 5 mLs by mouth every 8 (eight) hours as needed for cough. (Patient not taking: Reported on 02/10/2016) 120 mL 0 Not Taking  . megestrol (MEGACE) 40 MG tablet Take 1 tablet (40 mg total) by mouth 2 (two) times daily. Take 1 tablet twice a day for bleeding. If needed, can take 2 tablets twice a day (Patient not taking: Reported on 01/11/2017) 60 tablet 3 Not Taking at Unknown time  . methocarbamol (ROBAXIN) 500 MG tablet Take 1 tablet (500 mg total) by mouth 2 (two) times daily. (Patient not taking: Reported on 01/11/2017) 20 tablet 0 Not Taking at Unknown time  . naproxen (NAPROSYN) 500 MG tablet Take 1 tablet (500 mg total) by mouth 2 (two) times daily with a meal. (Patient not taking: Reported on 02/10/2016) 16 tablet 0 Not Taking  . nystatin ointment (MYCOSTATIN) Apply 1 application topically 2 (two) times daily. (Patient not taking: Reported on 02/10/2016) 30 g 0 Not Taking  . SUMAtriptan (IMITREX) 100 MG tablet Take 1 tablet (100 mg total) by mouth once. May repeat in 2 hours if headache persists or recurs. 10 tablet 2     Review of Systems  Constitutional: Negative.   Gastrointestinal: Negative.   Genitourinary: Positive for frequency and vaginal discharge. Negative for dyspareunia, dysuria, hematuria and vaginal bleeding.   Physical Exam   Blood pressure  132/87, pulse 68, temperature 97.6 F (36.4 C), temperature source Oral, resp. rate 20, height 5\' 6"  (1.676 m), weight (!) 304 lb 1.3 oz (137.9 kg), last menstrual period 05/20/2017.  Physical Exam  Nursing note and vitals reviewed. Constitutional: She is oriented to person, place, and time. She appears well-developed and well-nourished. No distress.  HENT:  Head: Normocephalic and atraumatic.  Eyes: Conjunctivae are normal. Right eye exhibits no discharge. Left eye exhibits no discharge. No scleral icterus.  Neck: Normal range of motion.  Respiratory: Effort normal. No respiratory distress.  Genitourinary: Cervix exhibits friability. Cervix exhibits no motion tenderness. No bleeding in the vagina. Vaginal discharge found.  Neurological: She is alert and oriented to person, place, and time.  Skin: Skin is warm and dry. She is not  diaphoretic.  Psychiatric: She has a normal mood and affect. Her behavior is normal. Judgment and thought content normal.    MAU Course  Procedures Results for orders placed or performed during the hospital encounter of 06/07/17 (from the past 24 hour(s))  Urinalysis, Routine w reflex microscopic     Status: Abnormal   Collection Time: 06/07/17  4:53 AM  Result Value Ref Range   Color, Urine YELLOW YELLOW   APPearance HAZY (A) CLEAR   Specific Gravity, Urine 1.023 1.005 - 1.030   pH 6.0 5.0 - 8.0   Glucose, UA NEGATIVE NEGATIVE mg/dL   Hgb urine dipstick NEGATIVE NEGATIVE   Bilirubin Urine NEGATIVE NEGATIVE   Ketones, ur NEGATIVE NEGATIVE mg/dL   Protein, ur NEGATIVE NEGATIVE mg/dL   Nitrite NEGATIVE NEGATIVE   Leukocytes, UA LARGE (A) NEGATIVE   RBC / HPF 0-5 0 - 5 RBC/hpf   WBC, UA 6-30 0 - 5 WBC/hpf   Bacteria, UA RARE (A) NONE SEEN   Squamous Epithelial / LPF 0-5 (A) NONE SEEN   Mucus PRESENT   Pregnancy, urine POC     Status: None   Collection Time: 06/07/17  5:20 AM  Result Value Ref Range   Preg Test, Ur NEGATIVE NEGATIVE  Wet prep, genital      Status: Abnormal   Collection Time: 06/07/17  5:55 AM  Result Value Ref Range   Yeast Wet Prep HPF POC PRESENT (A) NONE SEEN   Trich, Wet Prep NONE SEEN NONE SEEN   Clue Cells Wet Prep HPF POC NONE SEEN NONE SEEN   WBC, Wet Prep HPF POC MANY (A) NONE SEEN   Sperm NONE SEEN     MDM UPT negative Wet prep + yeast. Diflucan 150 mg PO given in MAU U/a inconclusive. Pt with no dysuria but does endorse urinary frequency & pressure. Will send urine for culture.   Assessment and Plan  A: 1. Vaginal yeast infection     P: Discharge home F/u with PCP GC/CT & urine culture pending Rx diflucan  Judeth Hornrin Chistine Dematteo 06/07/2017, 5:32 AM

## 2017-06-08 LAB — GC/CHLAMYDIA PROBE AMP (~~LOC~~) NOT AT ARMC
CHLAMYDIA, DNA PROBE: NEGATIVE
NEISSERIA GONORRHEA: NEGATIVE

## 2017-06-09 LAB — URINE CULTURE: Culture: 1000 — AB

## 2017-06-30 ENCOUNTER — Encounter (HOSPITAL_COMMUNITY): Payer: Self-pay | Admitting: *Deleted

## 2017-06-30 ENCOUNTER — Inpatient Hospital Stay (HOSPITAL_COMMUNITY)
Admission: AD | Admit: 2017-06-30 | Discharge: 2017-06-30 | Disposition: A | Discharge status: Home / Self Care | Payer: Commercial Managed Care - PPO | Source: Ambulatory Visit | Attending: Family Medicine | Admitting: Family Medicine

## 2017-06-30 DIAGNOSIS — J45909 Unspecified asthma, uncomplicated: Secondary | ICD-10-CM | POA: Diagnosis not present

## 2017-06-30 DIAGNOSIS — Z79899 Other long term (current) drug therapy: Secondary | ICD-10-CM | POA: Diagnosis not present

## 2017-06-30 DIAGNOSIS — N939 Abnormal uterine and vaginal bleeding, unspecified: Secondary | ICD-10-CM | POA: Diagnosis present

## 2017-06-30 DIAGNOSIS — I10 Essential (primary) hypertension: Secondary | ICD-10-CM | POA: Diagnosis not present

## 2017-06-30 DIAGNOSIS — E669 Obesity, unspecified: Secondary | ICD-10-CM | POA: Diagnosis not present

## 2017-06-30 LAB — CBC
HEMATOCRIT: 39.1 % (ref 36.0–46.0)
Hemoglobin: 12.4 g/dL (ref 12.0–15.0)
MCH: 27.1 pg (ref 26.0–34.0)
MCHC: 31.7 g/dL (ref 30.0–36.0)
MCV: 85.4 fL (ref 78.0–100.0)
PLATELETS: 338 10*3/uL (ref 150–400)
RBC: 4.58 MIL/uL (ref 3.87–5.11)
RDW: 14.5 % (ref 11.5–15.5)
WBC: 9.6 10*3/uL (ref 4.0–10.5)

## 2017-06-30 LAB — URINALYSIS, ROUTINE W REFLEX MICROSCOPIC
BACTERIA UA: NONE SEEN
BILIRUBIN URINE: NEGATIVE
Glucose, UA: NEGATIVE mg/dL
Ketones, ur: NEGATIVE mg/dL
NITRITE: NEGATIVE
PROTEIN: 30 mg/dL — AB
SPECIFIC GRAVITY, URINE: 1.028 (ref 1.005–1.030)
pH: 5 (ref 5.0–8.0)

## 2017-06-30 LAB — POCT PREGNANCY, URINE: PREG TEST UR: NEGATIVE

## 2017-06-30 MED ORDER — MEGESTROL ACETATE 20 MG PO TABS: 20.0000 mg | ORAL_TABLET | Freq: Two times a day (BID) | ORAL | 0 refills | Status: DC

## 2017-06-30 NOTE — MAU Note (Signed)
Pt reports she started her period on Feb 27 and is still bleeding heavily changing pad q 2-3 hours. C/o bad cramping a swell. Had this happen a bout 3 years ago and had a work up and did not find a cause . Got regulated again and has been having normal regular period x 3 years.

## 2017-06-30 NOTE — Discharge Instructions (Signed)
Dysfunctional Uterine Bleeding °Dysfunctional uterine bleeding is abnormal bleeding from the uterus. Dysfunctional uterine bleeding includes: °· A period that comes earlier or later than usual. °· A period that is lighter, heavier, or has blood clots. °· Bleeding between periods. °· Skipping one or more periods. °· Bleeding after sexual intercourse. °· Bleeding after menopause. ° °Follow these instructions at home: °Pay attention to any changes in your symptoms. Follow these instructions to help with your condition: °Eating and drinking °· Eat well-balanced meals. Include foods that are high in iron, such as liver, meat, shellfish, green leafy vegetables, and eggs. °· If you become constipated: °? Drink plenty of water. °? Eat fruits and vegetables that are high in water and fiber, such as spinach, carrots, raspberries, apples, and mango. °Medicines °· Take over-the-counter and prescription medicines only as told by your health care provider. °· Do not change medicines without talking with your health care provider. °· Aspirin or medicines that contain aspirin may make the bleeding worse. Do not take those medicines: °? During the week before your period. °? During your period. °· If you were prescribed iron pills, take them as told by your health care provider. Iron pills help to replace iron that your body loses because of this condition. °Activity °· If you need to change your sanitary pad or tampon more than one time every 2 hours: °? Lie in bed with your feet raised (elevated). °? Place a cold pack on your lower abdomen. °? Rest as much as possible until the bleeding stops or slows down. °· Do not try to lose weight until the bleeding has stopped and your blood iron level is back to normal. °Other Instructions °· For two months, write down: °? When your period starts. °? When your period ends. °? When any abnormal bleeding occurs. °? What problems you notice. °· Keep all follow up visits as told by your health  care provider. This is important. °Contact a health care provider if: °· You get light-headed or weak. °· You have nausea and vomiting. °· You cannot eat or drink without vomiting. °· You feel dizzy or have diarrhea while you are taking medicines. °· You are taking birth control pills or hormones, and you want to change them or stop taking them. °Get help right away if: °· You develop a fever or chills. °· You need to change your sanitary pad or tampon more than one time per hour. °· Your bleeding becomes heavier, or your flow contains clots more often. °· You develop pain in your abdomen. °· You lose consciousness. °· You develop a rash. °This information is not intended to replace advice given to you by your health care provider. Make sure you discuss any questions you have with your health care provider. °Document Released: 04/07/2000 Document Revised: 09/16/2015 Document Reviewed: 07/06/2014 °Elsevier Interactive Patient Education © 2018 [employer] ° °

## 2017-06-30 NOTE — MAU Provider Note (Addendum)
History     CSN: [id number]  Arrival [id number] and time: 06/30/17 2145   None     Chief Complaint  Patient presents with  . Pelvic Pain  . Vaginal Bleeding   42 yo non-pregnant female here for vaginal bleeding x 12 days. She had irregular periods in the past and had endometrial biopsy and additional workup, all normal. She took megace during that time and it helped the bleeding. She has not been on birth control for a few years because her periods have become more regular. She requests megace for this episode. Denies dizziness, shortness of breath, syncope.    Past Medical History:  Diagnosis Date  . Allergy-induced asthma    prn inhaler  . History of anemia    no current problem, per pt.  . Hypertension    under control with med., has been on med. x 1 yr.  . Migraines   . Obesity   . Pyogenic granuloma 12/2013   right ring finger  . Seasonal allergies     Past Surgical History:  Procedure Laterality Date  . CERVICAL CONE BIOPSY  age 42  . CHOLECYSTECTOMY  2006 or 2007  . DILATION AND CURETTAGE OF UTERUS    . DILATION AND EVACUATION  age 42  . MASS EXCISION Right 01/16/2014   Procedure: EXCISION PYOGENIC GRANULOMA FROM RIGHT RING FINGER;  Surgeon: Dairl PonderMatthew Weingold, MD;  Location: [facility];  Service: Orthopedics;  Laterality: Right;    Family History  Problem Relation Age of Onset  . Diabetes type II Father   . Hypertension Father   . Hypertension Mother   . Goiter Sister [id number]       identical twin  . Asthma Sister   . Hypertension Sister   . Stroke Maternal Grandfather   . Diabetes Paternal Grandfather   . Heart disease Paternal Grandfather     Social History   Tobacco Use  . Smoking status: Never Smoker  . Smokeless tobacco: Never Used  Substance Use Topics  . Alcohol use: No    Alcohol/week: 0.0 oz    Comment: Wine once a year   . Drug use: No    Allergies:  Allergies  Allergen Reactions  . Augmentin [Amoxicillin-Pot Clavulanate] Nausea  And Vomiting  . Lisinopril Swelling    Lip swelling/Angioedema  . Zofran [Ondansetron Hcl] Other (See Comments)    Severe headache    Medications Prior to Admission  Medication Sig Dispense Refill Last Dose  . albuterol (PROVENTIL HFA;VENTOLIN HFA) 108 (90 Base) MCG/ACT inhaler Inhale 2 puffs into the lungs every 4 (four) hours as needed for wheezing or shortness of breath. 1 Inhaler 0 06/06/2017 at Unknown time  . amLODipine (NORVASC) 5 MG tablet Take 1 tablet (5 mg total) by mouth daily. 90 tablet 3 06/06/2017 at Unknown time  . aspirin-acetaminophen-caffeine (EXCEDRIN MIGRAINE) 250-250-65 MG tablet Take 2 tablets by mouth every 6 (six) hours as needed for headache.   Past Month at Unknown time  . budesonide-formoterol (SYMBICORT) 160-4.5 MCG/ACT inhaler Inhale 2 puffs into the lungs 2 (two) times daily. 1 Inhaler 0 06/06/2017 at Unknown time  . butalbital-acetaminophen-caffeine (FIORICET, ESGIC) 50-325-40 MG tablet Take 1 tablet by mouth every 6 (six) hours as needed for headache. 30 tablet 1 Past Month at Unknown time  . cyclobenzaprine (FLEXERIL) 5 MG tablet Take 2 tablets (10 mg total) by mouth 3 (three) times daily as needed for muscle spasms. (Patient not taking: Reported on 02/10/2016) 15 tablet 0 Not Taking  .  diazepam (VALIUM) 5 MG tablet Take 1 tablet (5 mg total) by mouth every 6 (six) hours as needed for anxiety (spasms). (Patient not taking: Reported on 02/10/2016) 10 tablet 0 Not Taking  . eletriptan (RELPAX) 40 MG tablet Take 1 tablet (40 mg total) by mouth as needed for migraine or headache. May repeat in 2 hours if headache persists or recurs. 10 tablet 0 Past Month at Unknown time  . fluconazole (DIFLUCAN) 150 MG tablet Take 1 tablet (150 mg total) by mouth daily. 1 tablet 0   . hydrochlorothiazide (HYDRODIURIL) 25 MG tablet TAKE 1 TABLET (25 MG TOTAL) BY MOUTH DAILY. 60 tablet 5 06/06/2017 at Unknown time  . ibuprofen (ADVIL,MOTRIN) 200 MG tablet Take 800 mg by mouth every 6 (six)  hours as needed for headache.   Past Month at Unknown time  . methocarbamol (ROBAXIN) 500 MG tablet Take 1 tablet (500 mg total) by mouth 2 (two) times daily. (Patient not taking: Reported on 01/11/2017) 20 tablet 0 Not Taking at Unknown time  . prochlorperazine (COMPAZINE) 10 MG tablet Take 1 tablet (10 mg total) by mouth 2 (two) times daily as needed for nausea or vomiting. 10 tablet 0 Past Month at Unknown time  . SUMAtriptan (IMITREX) 100 MG tablet Take 1 tablet (100 mg total) by mouth once. May repeat in 2 hours if headache persists or recurs. 10 tablet 2     Review of Systems  Constitutional: Negative for activity change and appetite change.  HENT: Negative for congestion and ear discharge.   Eyes: Negative for discharge and itching.  Respiratory: Negative for apnea and chest tightness.   Cardiovascular: Negative for chest pain and leg swelling.  Gastrointestinal: Negative for abdominal distention and abdominal pain.  Endocrine: Negative for cold intolerance and heat intolerance.  Genitourinary: Positive for vaginal bleeding. Negative for dysuria.  Musculoskeletal: Negative for arthralgias and back pain.  Neurological: Negative for dizziness and headaches.  Hematological: Negative for adenopathy. Does not bruise/bleed easily.   Physical Exam   Height 5\' 6"  (1.676 m), weight 134.7 kg (297 lb), last menstrual period 06/20/2017.  Physical Exam  Constitutional: She is oriented to person, place, and time. She appears well-developed and well-nourished.  HENT:  Head: Normocephalic and atraumatic.  Eyes: Conjunctivae are normal. Pupils are equal, round, and reactive to light.  Neck: Normal range of motion. Neck supple.  Cardiovascular: Normal rate.  No murmur heard. Respiratory: Effort normal. No respiratory distress.  GI: Soft. She exhibits no distension.  Musculoskeletal: Normal range of motion. She exhibits no edema.  Neurological: She is alert and oriented to person, place, and  time.  Skin: Skin is warm and dry.  Psychiatric: She has a normal mood and affect. Her behavior is normal.    MAU Course  Procedures  MDM Hgb 12.4 and patient is well appearing. Will prescribe megace per patient request. Follow up as outpatient for further workup.  Assessment and Plan  1. Vaginal bleeding-discharge home in stable condition  Mary Hanson 06/30/2017, 10:20 PM

## 2017-08-06 ENCOUNTER — Other Ambulatory Visit: Payer: Self-pay | Admitting: Family Medicine

## 2017-08-06 ENCOUNTER — Encounter: Payer: Self-pay | Admitting: Family Medicine

## 2017-08-06 MED ORDER — NORETHIN-ETH ESTRAD-FE BIPHAS 1 MG-10 MCG / 10 MCG PO TABS: 1.0000 | ORAL_TABLET | Freq: Every day | ORAL | 0 refills | Status: DC

## 2017-08-20 MED FILL — HYDROCHLOROTHIAZIDE 25 MG T: 25 | 30 days supply | Qty: 30 | Fill #2 | Status: TO

## 2017-08-20 MED FILL — AMLODIPINE BESYLATE 5 MG TA: 5 | 30 days supply | Qty: 30 | Fill #1 | Status: TO

## 2017-09-13 ENCOUNTER — Other Ambulatory Visit (HOSPITAL_COMMUNITY)
Admission: RE | Admit: 2017-09-13 | Discharge: 2017-09-13 | Disposition: A | Discharge status: Home / Self Care | Payer: Commercial Managed Care - PPO | Source: Ambulatory Visit | Attending: Family Medicine | Admitting: Family Medicine

## 2017-09-13 ENCOUNTER — Ambulatory Visit (INDEPENDENT_AMBULATORY_CARE_PROVIDER_SITE_OTHER): Payer: Commercial Managed Care - PPO | Admitting: Family Medicine

## 2017-09-13 ENCOUNTER — Other Ambulatory Visit: Payer: Self-pay

## 2017-09-13 ENCOUNTER — Ambulatory Visit: Payer: Commercial Managed Care - PPO | Admitting: Family Medicine

## 2017-09-13 ENCOUNTER — Encounter: Payer: Self-pay | Admitting: Family Medicine

## 2017-09-13 VITALS — BP 126/84 | HR 108 | Temp 98.5°F | Ht 66.0 in | Wt 298.0 lb

## 2017-09-13 DIAGNOSIS — Z124 Encounter for screening for malignant neoplasm of cervix: Secondary | ICD-10-CM | POA: Diagnosis present

## 2017-09-13 DIAGNOSIS — N921 Excessive and frequent menstruation with irregular cycle: Secondary | ICD-10-CM

## 2017-09-13 DIAGNOSIS — Z0000 Encounter for general adult medical examination without abnormal findings: Secondary | ICD-10-CM | POA: Diagnosis not present

## 2017-09-13 DIAGNOSIS — G43109 Migraine with aura, not intractable, without status migrainosus: Secondary | ICD-10-CM

## 2017-09-13 DIAGNOSIS — Z01419 Encounter for gynecological examination (general) (routine) without abnormal findings: Secondary | ICD-10-CM | POA: Diagnosis not present

## 2017-09-13 DIAGNOSIS — N914 Secondary oligomenorrhea: Secondary | ICD-10-CM

## 2017-09-13 DIAGNOSIS — N926 Irregular menstruation, unspecified: Secondary | ICD-10-CM

## 2017-09-13 LAB — POCT UA - MICROALBUMIN
CREATININE, POC: 200 mg/dL
Microalbumin Ur, POC: 10 mg/L

## 2017-09-13 LAB — POCT UA - GLUCOSE/PROTEIN
Glucose, UA: NEGATIVE
Protein, UA: NEGATIVE

## 2017-09-13 LAB — POCT URINE PREGNANCY: PREG TEST UR: NEGATIVE

## 2017-09-13 MED ORDER — ALBUTEROL SULFATE HFA 108 (90 BASE) MCG/ACT IN AERS: 2.0000 | INHALATION_SPRAY | RESPIRATORY_TRACT | 0 refills | Status: DC | PRN

## 2017-09-13 MED ORDER — ELETRIPTAN HYDROBROMIDE 40 MG PO TABS: 40.0000 mg | ORAL_TABLET | ORAL | 3 refills | Status: DC | PRN

## 2017-09-13 NOTE — Assessment & Plan Note (Signed)
Continue Relpax prn 

## 2017-09-13 NOTE — Assessment & Plan Note (Signed)
Likely a combination of new OC's + anovulatory bleeding. Will give it a few months to see how she adjusts.

## 2017-09-13 NOTE — Patient Instructions (Addendum)
Preventive Care 18-39 Years, Female Preventive care refers to lifestyle choices and visits with your health care provider that can promote health and wellness. What does preventive care include?  A yearly physical exam. This is also called an annual well check.  Dental exams once or twice a year.  Routine eye exams. Ask your health care provider how often you should have your eyes checked.  Personal lifestyle choices, including: ? Daily care of your teeth and gums. ? Regular physical activity. ? Eating a healthy diet. ? Avoiding tobacco and drug use. ? Limiting alcohol use. ? Practicing safe sex. ? Taking vitamin and mineral supplements as recommended by your health care provider. What happens during an annual well check? The services and screenings done by your health care provider during your annual well check will depend on your age, overall health, lifestyle risk factors, and family history of disease. Counseling Your health care provider may ask you questions about your:  Alcohol use.  Tobacco use.  Drug use.  Emotional well-being.  Home and relationship well-being.  Sexual activity.  Eating habits.  Work and work Statistician.  Method of birth control.  Menstrual cycle.  Pregnancy history.  Screening You may have the following tests or measurements:  Height, weight, and BMI.  Diabetes screening. This is done by checking your blood sugar (glucose) after you have not eaten for a while (fasting).  Blood pressure.  Lipid and cholesterol levels. These may be checked every 5 years starting at age 66.  Skin check.  Hepatitis C blood test.  Hepatitis B blood test.  Sexually transmitted disease (STD) testing.  BRCA-related cancer screening. This may be done if you have a family history of breast, ovarian, tubal, or peritoneal cancers.  Pelvic exam and Pap test. This may be done every 3 years starting at age 40. Starting at age 59, this may be done every 5  years if you have a Pap test in combination with an HPV test.  Discuss your test results, treatment options, and if necessary, the need for more tests with your health care provider. Vaccines Your health care provider may recommend certain vaccines, such as:  Influenza vaccine. This is recommended every year.  Tetanus, diphtheria, and acellular pertussis (Tdap, Td) vaccine. You may need a Td booster every 10 years.  Varicella vaccine. You may need this if you have not been vaccinated.  HPV vaccine. If you are 69 or younger, you may need three doses over 6 months.  Measles, mumps, and rubella (MMR) vaccine. You may need at least one dose of MMR. You may also need a second dose.  Pneumococcal 13-valent conjugate (PCV13) vaccine. You may need this if you have certain conditions and were not previously vaccinated.  Pneumococcal polysaccharide (PPSV23) vaccine. You may need one or two doses if you smoke cigarettes or if you have certain conditions.  Meningococcal vaccine. One dose is recommended if you are age 27-21 years and a first-year college student living in a residence hall, or if you have one of several medical conditions. You may also need additional booster doses.  Hepatitis A vaccine. You may need this if you have certain conditions or if you travel or work in places where you may be exposed to hepatitis A.  Hepatitis B vaccine. You may need this if you have certain conditions or if you travel or work in places where you may be exposed to hepatitis B.  Haemophilus influenzae type b (Hib) vaccine. You may need this if  you have certain risk factors.  Talk to your health care provider about which screenings and vaccines you need and how often you need them. This information is not intended to replace advice given to you by your health care provider. Make sure you discuss any questions you have with your health care provider. Document Released: 06/06/2001 Document Revised: 12/29/2015  Document Reviewed: 02/09/2015 Elsevier Interactive Patient Education  2018 [place] Maintenance, Female Adopting a healthy lifestyle and getting preventive care can go a long way to promote health and wellness. Talk with your health care provider about what schedule of regular examinations is right for you. This is a good chance for you to check in with your provider about disease prevention and staying healthy. In between checkups, there are plenty of things you can do on your own. Experts have done a lot of research about which lifestyle changes and preventive measures are most likely to keep you healthy. Ask your health care provider for more information. Weight and diet Eat a healthy diet  Be sure to include plenty of vegetables, fruits, low-fat dairy products, and lean protein.  Do not eat a lot of foods high in solid fats, added sugars, or salt.  Get regular exercise. This is one of the most important things you can do for your health. ? Most adults should exercise for at least 150 minutes each week. The exercise should increase your heart rate and make you sweat (moderate-intensity exercise). ? Most adults should also do strengthening exercises at least twice a week. This is in addition to the moderate-intensity exercise.  Maintain a healthy weight  Body mass index (BMI) is a measurement that can be used to identify possible weight problems. It estimates body fat based on height and weight. Your health care provider can help determine your BMI and help you achieve or maintain a healthy weight.  For females 73 years of age and older: ? A BMI below 18.5 is considered underweight. ? A BMI of 18.5 to 24.9 is normal. ? A BMI of 25 to 29.9 is considered overweight. ? A BMI of 30 and above is considered obese.  Watch levels of cholesterol and blood lipids  You should start having your blood tested for lipids and cholesterol at 42 years of age, then have this test every 5  years.  You may need to have your cholesterol levels checked more often if: ? Your lipid or cholesterol levels are high. ? You are older than 42 years of age. ? You are at high risk for heart disease.  Cancer screening Lung Cancer  Lung cancer screening is recommended for adults 64-27 years old who are at high risk for lung cancer because of a history of smoking.  A yearly low-dose CT scan of the lungs is recommended for people who: ? Currently smoke. ? Have quit within the past 15 years. ? Have at least a 30-pack-year history of smoking. A pack year is smoking an average of one pack of cigarettes a day for 1 year.  Yearly screening should continue until it has been 15 years since you quit.  Yearly screening should stop if you develop a health problem that would prevent you from having lung cancer treatment.  Breast Cancer  Practice breast self-awareness. This means understanding how your breasts normally appear and feel.  It also means doing regular breast self-exams. Let your health care provider know about any changes, no matter how small.  If you are in your  32s or 43s, you should have a clinical breast exam (CBE) by a health care provider every 1-3 years as part of a regular health exam.  If you are 21 or older, have a CBE every year. Also consider having a breast X-ray (mammogram) every year.  If you have a family history of breast cancer, talk to your health care provider about genetic screening.  If you are at high risk for breast cancer, talk to your health care provider about having an MRI and a mammogram every year.  Breast cancer gene (BRCA) assessment is recommended for women who have family members with BRCA-related cancers. BRCA-related cancers include: ? Breast. ? Ovarian. ? Tubal. ? Peritoneal cancers.  Results of the assessment will determine the need for genetic counseling and BRCA1 and BRCA2 testing.  Cervical Cancer Your health care provider may  recommend that you be screened regularly for cancer of the pelvic organs (ovaries, uterus, and vagina). This screening involves a pelvic examination, including checking for microscopic changes to the surface of your cervix (Pap test). You may be encouraged to have this screening done every 3 years, beginning at age 34.  For women ages 55-65, health care providers may recommend pelvic exams and Pap testing every 3 years, or they may recommend the Pap and pelvic exam, combined with testing for human papilloma virus (HPV), every 5 years. Some types of HPV increase your risk of cervical cancer. Testing for HPV may also be done on women of any age with unclear Pap test results.  Other health care providers may not recommend any screening for nonpregnant women who are considered low risk for pelvic cancer and who do not have symptoms. Ask your health care provider if a screening pelvic exam is right for you.  If you have had past treatment for cervical cancer or a condition that could lead to cancer, you need Pap tests and screening for cancer for at least 20 years after your treatment. If Pap tests have been discontinued, your risk factors (such as having a new sexual partner) need to be reassessed to determine if screening should resume. Some women have medical problems that increase the chance of getting cervical cancer. In these cases, your health care provider may recommend more frequent screening and Pap tests.  Colorectal Cancer  This type of cancer can be detected and often prevented.  Routine colorectal cancer screening usually begins at 42 years of age and continues through 42 years of age.  Your health care provider may recommend screening at an earlier age if you have risk factors for colon cancer.  Your health care provider may also recommend using home test kits to check for hidden blood in the stool.  A small camera at the end of a tube can be used to examine your colon directly  (sigmoidoscopy or colonoscopy). This is done to check for the earliest forms of colorectal cancer.  Routine screening usually begins at age 68.  Direct examination of the colon should be repeated every 5-10 years through 42 years of age. However, you may need to be screened more often if early forms of precancerous polyps or small growths are found.  Skin Cancer  Check your skin from head to toe regularly.  Tell your health care provider about any new moles or changes in moles, especially if there is a change in a mole's shape or color.  Also tell your health care provider if you have a mole that is larger than the size of  a pencil eraser.  Always use sunscreen. Apply sunscreen liberally and repeatedly throughout the day.  Protect yourself by wearing long sleeves, pants, a wide-brimmed hat, and sunglasses whenever you are outside.  Heart disease, diabetes, and high blood pressure  High blood pressure causes heart disease and increases the risk of stroke. High blood pressure is more likely to develop in: ? People who have blood pressure in the high end of the normal range (130-139/85-89 mm Hg). ? People who are overweight or obese. ? People who are African American.  If you are 67-88 years of age, have your blood pressure checked every 3-5 years. If you are 39 years of age or older, have your blood pressure checked every year. You should have your blood pressure measured twice-once when you are at a hospital or clinic, and once when you are not at a hospital or clinic. Record the average of the two measurements. To check your blood pressure when you are not at a hospital or clinic, you can use: ? An automated blood pressure machine at a pharmacy. ? A home blood pressure monitor.  If you are between 95 years and 9 years old, ask your health care provider if you should take aspirin to prevent strokes.  Have regular diabetes screenings. This involves taking a blood sample to check your  fasting blood sugar level. ? If you are at a normal weight and have a low risk for diabetes, have this test once every three years after 42 years of age. ? If you are overweight and have a high risk for diabetes, consider being tested at a younger age or more often. Preventing infection Hepatitis B  If you have a higher risk for hepatitis B, you should be screened for this virus. You are considered at high risk for hepatitis B if: ? You were born in a country where hepatitis B is common. Ask your health care provider which countries are considered high risk. ? Your parents were born in a high-risk country, and you have not been immunized against hepatitis B (hepatitis B vaccine). ? You have HIV or AIDS. ? You use needles to inject street drugs. ? You live with someone who has hepatitis B. ? You have had sex with someone who has hepatitis B. ? You get hemodialysis treatment. ? You take certain medicines for conditions, including cancer, organ transplantation, and autoimmune conditions.  Hepatitis C  Blood testing is recommended for: ? Everyone born from [birth year] through 1965. ? Anyone with known risk factors for hepatitis C.  Sexually transmitted infections (STIs)  You should be screened for sexually transmitted infections (STIs) including gonorrhea and chlamydia if: ? You are sexually active and are younger than 42 years of age. ? You are older than 42 years of age and your health care provider tells you that you are at risk for this type of infection. ? Your sexual activity has changed since you were last screened and you are at an increased risk for chlamydia or gonorrhea. Ask your health care provider if you are at risk.  If you do not have HIV, but are at risk, it may be recommended that you take a prescription medicine daily to prevent HIV infection. This is called pre-exposure prophylaxis (PrEP). You are considered at risk if: ? You are sexually active and do not regularly use condoms  or know the HIV status of your partner(s). ? You take drugs by injection. ? You are sexually active with a partner who has  HIV.  Talk with your health care provider about whether you are at high risk of being infected with HIV. If you choose to begin PrEP, you should first be tested for HIV. You should then be tested every 3 months for as long as you are taking PrEP. Pregnancy  If you are premenopausal and you may become pregnant, ask your health care provider about preconception counseling.  If you may become pregnant, take 400 to 800 micrograms (mcg) of folic acid every day.  If you want to prevent pregnancy, talk to your health care provider about birth control (contraception). Osteoporosis and menopause  Osteoporosis is a disease in which the bones lose minerals and strength with aging. This can result in serious bone fractures. Your risk for osteoporosis can be identified using a bone density scan.  If you are 51 years of age or older, or if you are at risk for osteoporosis and fractures, ask your health care provider if you should be screened.  Ask your health care provider whether you should take a calcium or vitamin D supplement to lower your risk for osteoporosis.  Menopause may have certain physical symptoms and risks.  Hormone replacement therapy may reduce some of these symptoms and risks. Talk to your health care provider about whether hormone replacement therapy is right for you. Follow these instructions at home:  Schedule regular health, dental, and eye exams.  Stay current with your immunizations.  Do not use any tobacco products including cigarettes, chewing tobacco, or electronic cigarettes.  If you are pregnant, do not drink alcohol.  If you are breastfeeding, limit how much and how often you drink alcohol.  Limit alcohol intake to no more than 1 drink per day for nonpregnant women. One drink equals 12 ounces of beer, 5 ounces of wine, or 1 ounces of hard  liquor.  Do not use street drugs.  Do not share needles.  Ask your health care provider for help if you need support or information about quitting drugs.  Tell your health care provider if you often feel depressed.  Tell your health care provider if you have ever been abused or do not feel safe at home. This information is not intended to replace advice given to you by your health care provider. Make sure you discuss any questions you have with your health care provider. Document Released: 10/24/2010 Document Revised: 09/16/2015 Document Reviewed: 01/12/2015 Elsevier Interactive Patient Education  [employer].

## 2017-09-13 NOTE — Progress Notes (Signed)
Subjective:     Mary Hanson is a 42 y.o. female and is here for a comprehensive physical exam. The patient reports problems - spotting. Recently off Megace. Cycles are irregular. Started on Lo dose estrogen and having some spotting.  Also reports menstrual migraines. 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  Social History   Socioeconomic History  . Marital status: Single    Spouse name: Not on file  . Number of children: 0  . Years of education: Ba/Grad [place]  . Highest education level: Not on file  Occupational History    Employer: [employer]  Social Needs  . Financial resource strain: Not on file  . Food insecurity:    Worry: Not on file    Inability: Not on file  . Transportation needs:    Medical: Not on file    Non-medical: Not on file  Tobacco Use  . Smoking status: Never Smoker  . Smokeless tobacco: Never Used  Substance and Sexual Activity  . Alcohol use: No    Alcohol/week: 0.0 oz    Comment: Wine once a year   . Drug use: No  . Sexual activity: Yes    Birth control/protection: Condom    Comment: quit BCP whils on megace 7/14  Lifestyle  . Physical activity:    Days per week: Not on file    Minutes per session: Not on file  . Stress: Not on file  Relationships  . Social connections:    Talks on phone: Not on file    Gets together: Not on file    Attends religious service: Not on file    Active member of club or organization: Not on file    Attends meetings of clubs or organizations: Not on file    Relationship status: Not on file  . Intimate partner violence:    Fear of current or ex partner: Not on file    Emotionally abused: Not on file    Physically abused: Not on file    Forced sexual activity: Not on file  Other Topics Concern  . Not on file  Social History Narrative   Lives at home with herself.   Right handed.   Caffeine use: Drinks soda daily (Drinks three 20oz per day)   Health Maintenance  Topic Date Due  . PAP SMEAR  05/11/2017  .  INFLUENZA VACCINE  11/22/2017  . TETANUS/TDAP  08/01/2027  . HIV Screening  Completed    The following portions of the patient's history were reviewed and updated as appropriate: allergies, current medications, past family history, past medical history, past social history, past surgical history and problem list.  Review of Systems Pertinent items noted in HPI and remainder of comprehensive ROS otherwise negative.   Objective:    BP 126/84   Pulse (!) 108   Temp 98.5 F (36.9 C) (Oral)   Ht  (1.676 m)   Wt 298 lb (135.2 kg)   LMP 08/29/2017 (Exact Date)   SpO2 98%   BMI 48.10 kg/m  General appearance: alert, cooperative and appears stated age Head: Normocephalic, without obvious abnormality, atraumatic Neck: no adenopathy, supple, symmetrical, trachea midline and thyroid not enlarged, symmetric, no tenderness/mass/nodules Lungs: clear to auscultation bilaterally Breasts: normal appearance, no masses or tenderness Heart: regular rate and rhythm, S1, S2 normal, no murmur, click, rub or gallop Abdomen: soft, non-tender; bowel sounds normal; no masses,  no organomegaly Pelvic: cervix normal in appearance, external genitalia normal, no adnexal masses or tenderness, no  cervical motion tenderness, uterus normal size, shape, and consistency and vagina normal without discharge Extremities: extremities normal, atraumatic, no cyanosis or edema Pulses: 2+ and symmetric Skin: Skin color, texture, turgor normal. No rashes or lesions small skin tags noted under breasts bilaterally Lymph nodes: Cervical, supraclavicular, and axillary nodes normal. Neurologic: Grossly normal    Assessment:     Annual female exam.      Plan:      Problem List Items Addressed This Visit      Unprioritized   Morbid obesity (HCC)   Relevant Orders   Hemoglobin A1c   Lipid panel   Migraine headache    Continue Relpax prn      Relevant Medications   eletriptan (RELPAX) 40 MG tablet   Menorrhagia     Likely a combination of new OC's + anovulatory bleeding. Will give it a few months to see how she adjusts.       Other Visit Diagnoses    Well woman exam    -  Primary   Relevant Orders   POCT urine pregnancy (Completed)   POCT UA - Microalbumin (Completed)   Urinalysis - Glucose/Protein (Completed)   Vitamin D (25 hydroxy)   Routine cervical smear       Relevant Orders   Cytology - PAP([facility])   Irregular bleeding       Relevant Orders   POCT urine pregnancy (Completed)   Secondary oligomenorrhea       Relevant Orders   TSH   Follicle stimulating hormone   Routine general medical examination at a health care facility         Return in about 1 year (around 09/14/2018).  See After Visit Summary for Counseling Recommendations

## 2017-09-13 NOTE — Progress Notes (Signed)
poct

## 2017-09-14 ENCOUNTER — Encounter: Payer: Self-pay | Admitting: Family Medicine

## 2017-09-14 LAB — LIPID PANEL
CHOL/HDL RATIO: 2.6 ratio (ref 0.0–4.4)
Cholesterol, Total: 125 mg/dL (ref 100–199)
HDL: 48 mg/dL (ref >39)
LDL Calculated: 65 mg/dL (ref 0–99)
TRIGLYCERIDES: 61 mg/dL (ref 0–149)
VLDL Cholesterol Cal: 12 mg/dL (ref 5–40)

## 2017-09-14 LAB — FOLLICLE STIMULATING HORMONE: FSH: 5.2 m[IU]/mL

## 2017-09-14 LAB — VITAMIN D 25 HYDROXY (VIT D DEFICIENCY, FRACTURES): VIT D 25 HYDROXY: 26.6 ng/mL — AB (ref 30.0–100.0)

## 2017-09-14 LAB — TSH: TSH: 1.98 u[IU]/mL (ref 0.450–4.500)

## 2017-09-18 ENCOUNTER — Encounter (HOSPITAL_COMMUNITY): Payer: Self-pay

## 2017-09-18 ENCOUNTER — Other Ambulatory Visit: Payer: Self-pay | Admitting: Family Medicine

## 2017-09-18 ENCOUNTER — Other Ambulatory Visit: Payer: Self-pay

## 2017-09-18 ENCOUNTER — Inpatient Hospital Stay (HOSPITAL_COMMUNITY)
Admission: AD | Admit: 2017-09-18 | Discharge: 2017-09-18 | Disposition: A | Discharge status: Home / Self Care | Payer: Commercial Managed Care - PPO | Source: Ambulatory Visit | Attending: Obstetrics and Gynecology | Admitting: Obstetrics and Gynecology

## 2017-09-18 DIAGNOSIS — I10 Essential (primary) hypertension: Secondary | ICD-10-CM

## 2017-09-18 DIAGNOSIS — J45909 Unspecified asthma, uncomplicated: Secondary | ICD-10-CM | POA: Insufficient documentation

## 2017-09-18 DIAGNOSIS — N939 Abnormal uterine and vaginal bleeding, unspecified: Secondary | ICD-10-CM | POA: Diagnosis not present

## 2017-09-18 DIAGNOSIS — E669 Obesity, unspecified: Secondary | ICD-10-CM | POA: Insufficient documentation

## 2017-09-18 DIAGNOSIS — G43909 Migraine, unspecified, not intractable, without status migrainosus: Secondary | ICD-10-CM | POA: Diagnosis not present

## 2017-09-18 DIAGNOSIS — Z79899 Other long term (current) drug therapy: Secondary | ICD-10-CM | POA: Diagnosis not present

## 2017-09-18 LAB — CBC
HCT: 39.3 % (ref 36.0–46.0)
Hemoglobin: 12.3 g/dL (ref 12.0–15.0)
MCH: 27 pg (ref 26.0–34.0)
MCHC: 31.3 g/dL (ref 30.0–36.0)
MCV: 86.4 fL (ref 78.0–100.0)
Platelets: 308 10*3/uL (ref 150–400)
RBC: 4.55 MIL/uL (ref 3.87–5.11)
RDW: 15 % (ref 11.5–15.5)
WBC: 10.6 10*3/uL — ABNORMAL HIGH (ref 4.0–10.5)

## 2017-09-18 LAB — CYTOLOGY - PAP
ADEQUACY: ABSENT
Diagnosis: NEGATIVE

## 2017-09-18 MED ORDER — MEGESTROL ACETATE 40 MG PO TABS: 40.0000 mg | ORAL_TABLET | Freq: Every day | ORAL | 3 refills | Status: DC

## 2017-09-18 MED ORDER — NORETHIN-ETH ESTRAD-FE BIPHAS 1 MG-10 MCG / 10 MCG PO TABS: 1.0000 | ORAL_TABLET | Freq: Every day | ORAL | 2 refills | Status: DC

## 2017-09-18 NOTE — [facility] Provider Note (Signed)
History     CSN: [id number]  Arrival date and time: 09/18/17 1647   First Provider Initiated Contact with Patient 09/18/17 1725      No chief complaint on file.  Mary Hanson is a 42 y.o. G1P0010 with CHTN and obesity presenting with abnormal vaginal bleeding.  She states she has had only one day without any bleeding since since onset on 08/29/2017, which was the day she saw Dr. Shawnie Pons for comprehensive GYN exam and pelvic 09/13/2017.  She has had a history of irregular menses and has had work up with EB and labs all neg. Pap last month negative. She was started on Lo Loestrin in mid-May and has been taking it as directed until yesterday when she took the pill 12 hours late. She started today with light bleeding that has progressed to as heavy as a period. It is associated with suprapubic cramping and heaviness similar to usual prementrual symptoms. She denies fatigue or orthostatic symptoms    OB History  Gravida Para Term Preterm AB Living  1       1 0  SAB TAB Ectopic Multiple Live Births    1          # Outcome Date GA Lbr Len/2nd Weight Sex Delivery Anes PTL Lv  1 TAB 1998             Past Medical History:  Diagnosis Date  . Allergy-induced asthma    prn inhaler  . History of anemia    no current problem, per pt.  . Hypertension    under control with med., has been on med. x 1 yr.  . Migraines   . Obesity   . Pyogenic granuloma 12/2013   right ring finger  . Seasonal allergies     Past Surgical History:  Procedure Laterality Date  . CERVICAL CONE BIOPSY  age 59  . CHOLECYSTECTOMY  2006 or 2007  . DILATION AND CURETTAGE OF UTERUS    . DILATION AND EVACUATION  age 47  . MASS EXCISION Right 01/16/2014   Procedure: EXCISION PYOGENIC GRANULOMA FROM RIGHT RING FINGER;  Surgeon: Dairl Ponder, MD;  Location: [facility];  Service: Orthopedics;  Laterality: Right;    Family History  Problem Relation Age of Onset  . Diabetes type II Father   .  Hypertension Father   . Hypertension Mother   . Goiter Sister 63       identical twin  . Asthma Sister   . Hypertension Sister   . Stroke Maternal Grandfather   . Diabetes Paternal Grandfather   . Heart disease Paternal Grandfather     Social History   Tobacco Use  . Smoking status: Never Smoker  . Smokeless tobacco: Never Used  Substance Use Topics  . Alcohol use: No    Alcohol/week: 0.0 oz    Comment: Wine once a year   . Drug use: No    Allergies:  Allergies  Allergen Reactions  . Augmentin [Amoxicillin-Pot Clavulanate] Nausea And Vomiting  . Lisinopril Swelling    Lip swelling/Angioedema  . Zofran [Ondansetron Hcl] Other (See Comments)    Severe headache    Medications Prior to Admission  Medication Sig Dispense Refill Last Dose  . albuterol (PROVENTIL HFA;VENTOLIN HFA) 108 (90 Base) MCG/ACT inhaler Inhale 2 puffs into the lungs every 4 (four) hours as needed for wheezing or shortness of breath. 1 Inhaler 0   . amLODipine (NORVASC) 5 MG tablet Take 1 tablet (5 mg total) by  mouth daily. 90 tablet 3 Taking  . aspirin-acetaminophen-caffeine (EXCEDRIN MIGRAINE) 250-250-65 MG tablet Take 2 tablets by mouth every 6 (six) hours as needed for headache.   Taking  . budesonide-formoterol (SYMBICORT) 160-4.5 MCG/ACT inhaler Inhale 2 puffs into the lungs 2 (two) times daily. 1 Inhaler 0 Taking  . eletriptan (RELPAX) 40 MG tablet Take 1 tablet (40 mg total) by mouth as needed for migraine or headache. May repeat in 2 hours if headache persists or recurs. 10 tablet 3   . hydrochlorothiazide (HYDRODIURIL) 25 MG tablet TAKE 1 TABLET (25 MG TOTAL) BY MOUTH DAILY. 60 tablet 5 Taking  . ibuprofen (ADVIL,MOTRIN) 200 MG tablet Take 800 mg by mouth every 6 (six) hours as needed for headache.   Taking  . Norethindrone-Ethinyl Estradiol-Fe Biphas (LO LOESTRIN FE) 1 MG-10 MCG / 10 MCG tablet Take 1 tablet by mouth daily. 1 Package 2     Review of Systems  Constitutional: Negative for  activity change and fatigue.  Respiratory: Negative for shortness of breath.   Gastrointestinal: Negative for abdominal pain and nausea.  Genitourinary: Negative for dysuria.  Musculoskeletal: Negative for back pain.  Neurological: Negative for dizziness, weakness and light-headedness.   Physical Exam   Blood pressure (!) 150/81, pulse 98, temperature 98.2 F (36.8 C), temperature source Oral, resp. rate 18, last menstrual period 08/29/2017, SpO2 99 %.  Physical Exam  Nursing note and vitals reviewed. Constitutional: She is oriented to person, place, and time. She appears well-developed and well-nourished. No distress.  HENT:  Head: Normocephalic.  Eyes: No scleral icterus.  Neck: Neck supple.  Cardiovascular: Normal rate.  Respiratory: Effort normal.  GI: Soft. There is no tenderness.  Genitourinary:  Genitourinary Comments: Deferred  Musculoskeletal: Normal range of motion.  Neurological: She is alert and oriented to person, place, and time.  Skin: Skin is warm and dry.  Psychiatric: She has a normal mood and affect. Her behavior is normal.    MAU Course  Procedures Results for orders placed or performed during the hospital encounter of 09/18/17 (from the past 24 hour(s))  CBC     Status: Abnormal   Collection Time: 09/18/17  5:49 PM  Result Value Ref Range   WBC 10.6 (H) 4.0 - 10.5 K/uL   RBC 4.55 3.87 - 5.11 MIL/uL   Hemoglobin 12.3 12.0 - 15.0 g/dL   HCT 16.1 09.6 - 04.5 %   MCV 86.4 78.0 - 100.0 fL   MCH 27.0 26.0 - 34.0 pg   MCHC 31.3 30.0 - 36.0 g/dL   RDW 40.9 81.1 - 91.4 %   Platelets 308 150 - 400 K/uL   MDM Explained expectation of BTB on low dose newly started OCPs and is a taking it at the same time every day.Hemodynamically stable with persistent anovulatory bleeding so will discharge home.. Consulted Dr. Adrian Blackwater re meds> will continue Lo-loestrin and add Megace  bid until GYN appoointment   Assessment and Plan   1. Abnormal vaginal bleeding    2. Chronic hypertension    Allergies as of 09/18/2017      Reactions   Augmentin [amoxicillin-pot Clavulanate] Nausea And Vomiting   Lisinopril Swelling   Lip swelling/Angioedema   Zofran [ondansetron Hcl] Other (See Comments)   Severe headache      Medication List    TAKE these medications   albuterol 108 (90 Base) MCG/ACT inhaler Commonly known as:  PROVENTIL HFA;VENTOLIN HFA Inhale 2 puffs into the lungs every 4 (four) hours as needed for wheezing or  shortness of breath.   amLODipine 5 MG tablet Commonly known as:  NORVASC Take 1 tablet (5 mg total) by mouth daily.   aspirin-acetaminophen-caffeine 250-250-65 MG tablet Commonly known as:  EXCEDRIN MIGRAINE Take 2 tablets by mouth every 6 (six) hours as needed for headache.   budesonide-formoterol 160-4.5 MCG/ACT inhaler Commonly known as:  SYMBICORT Inhale 2 puffs into the lungs 2 (two) times daily.   eletriptan 40 MG tablet Commonly known as:  RELPAX Take 1 tablet (40 mg total) by mouth as needed for migraine or headache. May repeat in 2 hours if headache persists or recurs.   hydrochlorothiazide 25 MG tablet Commonly known as:  HYDRODIURIL TAKE 1 TABLET (25 MG TOTAL) BY MOUTH DAILY.   ibuprofen 200 MG tablet Commonly known as:  ADVIL,MOTRIN Take 800 mg by mouth every 6 (six) hours as needed for headache.   megestrol 40 MG tablet Commonly known as:  MEGACE Take 1 tablet (40 mg total) by mouth daily.   Norethindrone-Ethinyl Estradiol-Fe Biphas 1 MG-10 MCG / 10 MCG tablet Commonly known as:  LO LOESTRIN FE Take 1 tablet by mouth daily.      Follow-up Information    Reva Bores, MD. Schedule an appointment as soon as possible for a visit in 1 month(s).   Specialty:  Obstetrics and Gynecology Contact information: [address] [place] [address] [phone]          Abrahim Sargent CNM 09/18/2017, 5:25 PM

## 2017-09-18 NOTE — Discharge Instructions (Signed)
Abnormal Uterine Bleeding Abnormal uterine bleeding can affect women at various stages in life, including teenagers, women in their reproductive years, pregnant women, and women who have reached menopause. Several kinds of uterine bleeding are considered abnormal, including:  Bleeding or spotting between periods.  Bleeding after sexual intercourse.  Bleeding that is heavier or more than normal.  Periods that last longer than usual.  Bleeding after menopause. Many cases of abnormal uterine bleeding are minor and simple to treat, while others are more serious. Any type of abnormal bleeding should be evaluated by your health care provider. Treatment will depend on the cause of the bleeding. Follow these instructions at home: Monitor your condition for any changes. The following actions may help to alleviate any discomfort you are experiencing:  Avoid the use of tampons and douches as directed by your health care provider.  Change your pads frequently. You should get regular pelvic exams and Pap tests. Keep all follow-up appointments for diagnostic tests as directed by your health care provider. Contact a health care provider if:  Your bleeding lasts more than 1 week.  You feel dizzy at times. Get help right away if:  You pass out.  You are changing pads every 15 to 30 minutes.  You have abdominal pain.  You have a fever.  You become sweaty or weak.  You are passing large blood clots from the vagina.  You start to feel nauseous and vomit. This information is not intended to replace advice given to you by your health care provider. Make sure you discuss any questions you have with your health care provider. Document Released: 04/10/2005 Document Revised: 09/22/2015 Document Reviewed: 11/07/2012 Elsevier Interactive Patient Education  2017 Elsevier Inc.  

## 2017-09-19 ENCOUNTER — Telehealth: Payer: Self-pay | Admitting: Family Medicine

## 2017-09-19 MED FILL — ELETRIPTAN HBR 40 MG TABLET: 40 | 30 days supply | Qty: 10 | Fill #0

## 2017-09-19 MED FILL — AMLODIPINE BESYLATE 5 MG TA: 5 | 30 days supply | Qty: 30 | Fill #0

## 2017-09-19 MED FILL — HYDROCHLOROTHIAZIDE 25 MG T: 25 | 30 days supply | Qty: 30 | Fill #0

## 2017-09-19 MED FILL — LO LOESTRIN FE 1-10 TABLET: 1 MG-10 MCG | 28 days supply | Qty: 28 | Fill #0

## 2017-09-19 MED FILL — MEGESTROL 40 MG TABLET: 40 | 30 days supply | Qty: 30 | Fill #0

## 2017-09-19 MED FILL — VENTOLIN HFA 90 MCG INHALER: 108 (90 BAS | 25 days supply | Qty: 18 | Fill #0

## 2017-09-19 NOTE — Telephone Encounter (Addendum)
Pt called states was seen [facility] 09/18/17 for heavy bleeding . Pt states will go to pharmacy today to pick up script for Megace. Pt to fu in one month per [facility] in [facility]. Once July schedule is ready she will schedule appt.

## 2017-09-24 DIAGNOSIS — S86011A Strain of right Achilles tendon, initial encounter: Secondary | ICD-10-CM | POA: Diagnosis not present

## 2017-09-24 DIAGNOSIS — S838X1A Sprain of other specified parts of right knee, initial encounter: Secondary | ICD-10-CM | POA: Diagnosis not present

## 2017-09-26 ENCOUNTER — Ambulatory Visit: Payer: Self-pay | Admitting: Nurse Practitioner

## 2017-09-26 ENCOUNTER — Encounter: Payer: Self-pay | Admitting: Nurse Practitioner

## 2017-09-26 VITALS — BP 122/64 | HR 103 | Temp 99.5°F | Wt 299.4 lb

## 2017-09-26 DIAGNOSIS — J302 Other seasonal allergic rhinitis: Secondary | ICD-10-CM

## 2017-09-26 DIAGNOSIS — J029 Acute pharyngitis, unspecified: Secondary | ICD-10-CM

## 2017-09-26 LAB — POCT RAPID STREP A (OFFICE): RAPID STREP A SCREEN: NEGATIVE

## 2017-09-26 MED ORDER — FLUTICASONE PROPIONATE 50 MCG/ACT NA SUSP: 2.0000 | Freq: Every day | NASAL | 0 refills | Status: DC

## 2017-09-26 MED ORDER — MAGIC MOUTHWASH W/LIDOCAINE: 5.0000 mL | Freq: Three times a day (TID) | ORAL | 0 refills | Status: AC | PRN

## 2017-09-26 MED ORDER — MONTELUKAST SODIUM 10 MG PO TABS: 10.0000 mg | ORAL_TABLET | Freq: Every day | ORAL | 1 refills | Status: DC

## 2017-09-26 MED FILL — MONTELUKAST SOD 10 MG TAB: 10 | 30 days supply | Qty: 30 | Fill #0

## 2017-09-26 MED FILL — FLUTICASONE PROP 50 MCG SPR: 50 | 30 days supply | Qty: 16 | Fill #0

## 2017-09-26 MED FILL — MAGIC MW (LID/NYS/MAA/BEN): 8 days supply | Qty: 120 | Fill #0

## 2017-09-26 NOTE — Progress Notes (Signed)
Subjective:     Mary Hanson is a 42 y.o. female who presents for evaluation and treatment of allergic symptoms. Symptoms include: clear rhinorrhea, headaches, postnasal drip, pressure sensation in ears and sinus pressure and are present in a seasonal pattern.  Patient also complains of moderate throat pain, with difficulty swallowing and eating.  Patient also complains of her headache pain worsening which brought her in today.  Precipitants include: pollen, change in the weather. Treatment currently includes Allegra, Excedrin Migraine and Fluticasone and are somewhat effective.  Patient is also on prednisone as she hurt her knee about 2 days ago.  Patient states the prednisone has helped her somewhat.  She does have a history of asthma, but does not complain of any problems at this time.  The following portions of the patient's history were reviewed and updated as appropriate: allergies, current medications and past medical history.  Review of Systems Constitutional: positive for anorexia, chills and fatigue, negative for fevers, malaise and sweats Eyes: negative Ears, nose, mouth, throat, and face: positive for nasal congestion, sore throat and bilateral ear pressuree, negative for ear drainage and hoarseness Respiratory: positive for asthma and cough, negative for chronic bronchitis, dyspnea on exertion, pneumonia, sputum, stridor and wheezing Cardiovascular: negative Gastrointestinal: negative Neurological: positive for headaches, negative for coordination problems, dizziness, memory problems, speech problems, tremors, vertigo and weakness Allergic/Immunologic: positive for hay fever    Objective:    BP 122/64   Pulse (!) 103   Temp 99.5 F (37.5 C)   Wt 299 lb 6.4 oz (135.8 kg)   LMP 08/29/2017 (Exact Date)   SpO2 98%   BMI 48.32 kg/m  General appearance: alert, cooperative, fatigued and no distress Head: Normocephalic, without obvious abnormality, atraumatic Eyes:  conjunctivae/corneas clear. PERRL, EOM's intact. Fundi benign. Ears: normal TM's and external ear canals both ears Nose: no discharge, turbinates swollen, inflamed, no maxillary sinus tenderness bilateral, mild frontal sinus tenderness left Throat: abnormal findings: moderate oropharyngeal erythema Lungs: clear to auscultation bilaterally Heart: regular rate and rhythm, S1, S2 normal, no murmur, click, rub or gallop Abdomen: soft, non-tender; bowel sounds normal; no masses,  no organomegaly Pulses: 2+ and symmetric Skin: Skin color, texture, turgor normal. No rashes or lesions Lymph nodes: cervical lymphadenopathy Neurologic: Grossly normal    Assessment:    Allergic rhinitis.    Plan:    Medications: intranasal steroids: Fluticasone , leukotrienes inhibitors:  Singulair, Magic Mouthwash for throat pain.  Patient encouraged to use honey, warm or cool liquids, salt water gargles, and Tylenol for throat pain for 3 days as directed.  Patient to continue the already prescribed steroids that she is currently taking.  Patient instructed to use fluticasone nasal spray as needed and to avoid overuse as she is already on steroids.  Patient instructed to follow-up if she develops a high fever, her symptoms rapidly worsen.  Patient verbalizes understanding and has no questions at time of discharge. Allergen avoidance discussed. Follow-up in 4 days.   Meds ordered this encounter  Medications  . montelukast (SINGULAIR) 10 MG tablet    Sig: Take 1 tablet (10 mg total) by mouth at bedtime.    Dispense:  30 tablet    Refill:  1    Order Specific Question:   Supervising Provider    Answer:   Stacie Glaze [[id number]]  . magic mouthwash w/lidocaine SOLN    Sig: Take 5 mLs by mouth 3 (three) times daily as needed for up to 7 days for mouth pain.  Dispense:  120 mL    Refill:  0    Benadryl, Lidocaine, Maalox, Nystatin----- 1:1 ratio    Order Specific Question:   Supervising Provider    Answer:    Stacie GlazeJENKINS, JOHN E [[id number]]  . fluticasone (FLONASE) 50 MCG/ACT nasal spray    Sig: Place 2 sprays into both nostrils daily for 10 days.    Dispense:  16 g    Refill:  0    Order Specific Question:   Supervising Provider    Answer:   Stacie GlazeJENKINS, JOHN E [phone][[id number]]

## 2017-09-26 NOTE — Patient Instructions (Signed)
Allergic Rhinitis, Adult Allergic rhinitis is an allergic reaction that affects the mucous membrane inside the nose. It causes sneezing, a runny or stuffy nose, and the feeling of mucus going down the back of the throat (postnasal drip). Allergic rhinitis can be mild to severe. There are two types of allergic rhinitis:  Seasonal. This type is also called hay fever. It happens only during certain seasons.  Perennial. This type can happen at any time of the year.  What are the causes? This condition happens when the body's defense system (immune system) responds to certain harmless substances called allergens as though they were germs.  Seasonal allergic rhinitis is triggered by pollen, which can come from grasses, trees, and weeds. Perennial allergic rhinitis may be caused by:  House dust mites.  Pet dander.  Mold spores.  What are the signs or symptoms? Symptoms of this condition include:  Sneezing.  Runny or stuffy nose (nasal congestion).  Postnasal drip.  Itchy nose.  Tearing of the eyes.  Trouble sleeping.  Daytime sleepiness.  How is this diagnosed? This condition may be diagnosed based on:  Your medical history.  A physical exam.  Tests to check for related conditions, such as: ? Asthma. ? Pink eye. ? Ear infection. ? Upper respiratory infection.  Tests to find out which allergens trigger your symptoms. These may include skin or blood tests.  How is this treated? There is no cure for this condition, but treatment can help control symptoms. Treatment may include:  Taking medicines that block allergy symptoms, such as antihistamines. Medicine may be given as a shot, nasal spray, or pill.  Avoiding the allergen.  Desensitization. This treatment involves getting ongoing shots until your body becomes less sensitive to the allergen. This treatment may be done if other treatments do not help.  If taking medicine and avoiding the allergen does not work, new,  stronger medicines may be prescribed.  Follow these instructions at home:  Find out what you are allergic to. Common allergens include smoke, dust, and pollen.  Avoid the things you are allergic to. These are some things you can do to help avoid allergens: ? Replace carpet with wood, tile, or vinyl flooring. Carpet can trap dander and dust. ? Do not smoke. Do not allow smoking in your home. ? Change your heating and air conditioning filter at least once a month. ? During allergy season:  Keep windows closed as much as possible.  Plan outdoor activities when pollen counts are lowest. This is usually during the evening hours.  When coming indoors, change clothing and shower before sitting on furniture or bedding.  Take over-the-counter and prescription medicines only as told by your health care provider.  Keep all follow-up visits as told by your health care provider. This is important. Contact a health care provider if:  You have a fever.  You develop a persistent cough.  You make whistling sounds when you breathe (you wheeze).  Your symptoms interfere with your normal daily activities. Get help right away if:  You have shortness of breath. Summary  This condition can be managed by taking medicines as directed and avoiding allergens.  Contact your health care provider if you develop a persistent cough or fever.  During allergy season, keep windows closed as much as possible. This information is not intended to replace advice given to you by your health care provider. Make sure you discuss any questions you have with your health care provider. Document Released: 01/03/2001 Document Revised: 05/18/2016  Document Reviewed: 05/18/2016 Elsevier Interactive Patient Education  2018 Elsevier Inc.  Pharyngitis Pharyngitis is redness, pain, and swelling (inflammation) of the throat (pharynx). It is a very common cause of sore throat. Pharyngitis can be caused by a bacteria, but it  is usually caused by a virus. Most cases of pharyngitis get better on their own without treatment. What are the causes? This condition may be caused by:  Infection by viruses (viral). Viral pharyngitis spreads from person to person (is contagious) through coughing, sneezing, and sharing of personal items or utensils such as cups, forks, spoons, and toothbrushes.  Infection by bacteria (bacterial). Bacterial pharyngitis may be spread by touching the nose or face after coming in contact with the bacteria, or through more intimate contact, such as kissing.  Allergies. Allergies can cause buildup of mucus in the throat (post-nasal drip), leading to inflammation and irritation. Allergies can also cause blocked nasal passages, forcing breathing through the mouth, which dries and irritates the throat.  What increases the risk? You are more likely to develop this condition if:  You are 615-42 years old.  You are exposed to crowded environments such as daycare, school, or dormitory living.  You live in a cold climate.  You have a weakened disease-fighting (immune) system.  What are the signs or symptoms? Symptoms of this condition vary by the cause (viral, bacterial, or allergies) and can include:  Sore throat.  Fatigue.  Low-grade fever.  Headache.  Joint pain and muscle aches.  Skin rashes.  Swollen glands in the throat (lymph nodes).  Plaque-like film on the throat or tonsils. This is often a symptom of bacterial pharyngitis.  Vomiting.  Stuffy nose (nasal congestion).  Cough.  Red, itchy eyes (conjunctivitis).  Loss of appetite.  How is this diagnosed? This condition is often diagnosed based on your medical history and a physical exam. Your health care provider will ask you questions about your illness and your symptoms. A swab of your throat may be done to check for bacteria (rapid strep test). Other lab tests may also be done, depending on the suspected cause, but these  are rare. How is this treated? This condition usually gets better in 3-4 days without medicine. Bacterial pharyngitis may be treated with antibiotic medicines. Follow these instructions at home:  Take over-the-counter and prescription medicines only as told by your health care provider. ? If you were prescribed an antibiotic medicine, take it as told by your health care provider. Do not stop taking the antibiotic even if you start to feel better. ? Do not give children aspirin because of the association with Reye syndrome.  Drink enough water and fluids to keep your urine clear or pale yellow.  Get a lot of rest.  Gargle with a salt-water mixture 3-4 times a day or as needed. To make a salt-water mixture, completely dissolve -1 tsp of salt in 1 cup of warm water.  If your health care provider approves, you may use throat lozenges or sprays to soothe your throat. Contact a health care provider if:  You have large, tender lumps in your neck.  You have a rash.  You cough up green, yellow-brown, or bloody spit. Get help right away if:  Your neck becomes stiff.  You drool or are unable to swallow liquids.  You cannot drink or take medicines without vomiting.  You have severe pain that does not go away, even after you take medicine.  You have trouble breathing, and it is not caused by  a stuffy nose.  You have new pain and swelling in your joints such as the knees, ankles, wrists, or elbows. Summary  Pharyngitis is redness, pain, and swelling (inflammation) of the throat (pharynx).  While pharyngitis can be caused by a bacteria, the most common causes are viral.  Most cases of pharyngitis get better on their own without treatment.  Bacterial pharyngitis is treated with antibiotic medicines. This information is not intended to replace advice given to you by your health care provider. Make sure you discuss any questions you have with your health care provider. Document Released:  04/10/2005 Document Revised: 05/16/2016 Document Reviewed: 05/16/2016 Elsevier Interactive Patient Education  [employer].

## 2017-09-27 ENCOUNTER — Other Ambulatory Visit: Payer: Self-pay

## 2017-09-27 ENCOUNTER — Encounter: Payer: Self-pay | Admitting: Family Medicine

## 2017-09-27 ENCOUNTER — Ambulatory Visit (INDEPENDENT_AMBULATORY_CARE_PROVIDER_SITE_OTHER): Payer: Commercial Managed Care - PPO | Admitting: Family Medicine

## 2017-09-27 VITALS — BP 120/70 | HR 90 | Temp 98.4°F | Wt 297.0 lb

## 2017-09-27 DIAGNOSIS — J0100 Acute maxillary sinusitis, unspecified: Secondary | ICD-10-CM | POA: Diagnosis not present

## 2017-09-27 DIAGNOSIS — J45901 Unspecified asthma with (acute) exacerbation: Secondary | ICD-10-CM

## 2017-09-27 MED ORDER — BUDESONIDE-FORMOTEROL FUMARATE 160-4.5 MCG/ACT IN AERO: 2.0000 | INHALATION_SPRAY | Freq: Two times a day (BID) | RESPIRATORY_TRACT | 0 refills | Status: DC

## 2017-09-27 MED ORDER — FEXOFENADINE HCL 180 MG PO TABS: 180.0000 mg | ORAL_TABLET | Freq: Every day | ORAL | 3 refills | Status: DC

## 2017-09-27 MED ORDER — AMOXICILLIN 500 MG PO CAPS: 500.0000 mg | ORAL_CAPSULE | Freq: Three times a day (TID) | ORAL | 0 refills | Status: DC

## 2017-09-27 MED ORDER — BENZONATATE 100 MG PO CAPS: 100.0000 mg | ORAL_CAPSULE | Freq: Two times a day (BID) | ORAL | 0 refills | Status: DC | PRN

## 2017-09-27 MED FILL — BENZONATATE 100 MG CAPS: 100 | 10 days supply | Qty: 20 | Fill #0

## 2017-09-27 MED FILL — AMOXICILLIN 500 MG CAPSULE: 500 | 5 days supply | Qty: 15 | Fill #0

## 2017-09-27 MED FILL — SYMBICORT 160-4.5 MCG INH: 160-4.5 | 30 days supply | Qty: 10 | Fill #0

## 2017-09-27 NOTE — Progress Notes (Signed)
   Subjective:    Patient ID: Darrick GrinderGaye Wenrick is a 42 y.o. female presenting with Nasal Congestion and Cough  on 09/27/2017  HPI: Woke up Tuesday with cold. [facility] and strep was negative. Given Flonase and tylenol. Notes probable fever and woke up sweating. Notes chills/shivering and then burning up. Throat is not hurting as bad. Feels really bad. Has sinus pressure. Usually has one sinus infection/year.  Has possible sick contacts. Sister with pneumonia.   Review of Systems  Constitutional: Negative for chills and fever.  Respiratory: Negative for shortness of breath.   Cardiovascular: Negative for chest pain.  Gastrointestinal: Negative for abdominal pain, nausea and vomiting.  Genitourinary: Negative for dysuria.  Skin: Negative for rash.      Objective:    BP 120/70   Pulse 90   Temp 98.4 F (36.9 C) (Oral)   Wt 297 lb (134.7 kg)   LMP 08/29/2017 (Exact Date)   SpO2 98%   BMI 47.94 kg/m  Physical Exam  Constitutional: She is oriented to person, place, and time. She appears well-developed and well-nourished. No distress.  HENT:  Head: Normocephalic and atraumatic.  Maxillary sinus tenderness  Eyes: No scleral icterus.  Neck: Neck supple.  Cardiovascular: Normal rate.  Pulmonary/Chest: Effort normal.  Abdominal: Soft.  Neurological: She is alert and oriented to person, place, and time.  Skin: Skin is warm and dry.  Psychiatric: She has a normal mood and affect.        Assessment & Plan:   Problem List Items Addressed This Visit      Unprioritized   Mild asthma exacerbation   Relevant Medications   budesonide-formoterol (SYMBICORT) 160-4.5 MCG/ACT inhaler    Other Visit Diagnoses    Acute non-recurrent maxillary sinusitis    -  Primary   Likely viral--hold on antibiotics unless not improved after 7-10 days--symptomatic treatment   Relevant Medications   benzonatate (TESSALON) 100 MG capsule   fexofenadine (ALLEGRA ALLERGY) 180 MG tablet   amoxicillin (AMOXIL) 500 MG capsule      Total face-to-face time with patient: 15 minutes. Over 50% of encounter was spent on counseling and coordination of care. Return if symptoms worsen or fail to improve.  Reva Boresanya S Larsen Dungan 09/27/2017 4:10 PM

## 2017-09-27 NOTE — Patient Instructions (Signed)

## 2017-10-16 DIAGNOSIS — S335XXA Sprain of ligaments of lumbar spine, initial encounter: Secondary | ICD-10-CM | POA: Diagnosis not present

## 2017-10-29 MED FILL — METHOCARBAMOL 500 MG TABLET: 500 | 10 days supply | Qty: 30 | Fill #0

## 2017-11-15 ENCOUNTER — Other Ambulatory Visit: Payer: Self-pay | Admitting: Family Medicine

## 2017-11-15 DIAGNOSIS — I10 Essential (primary) hypertension: Secondary | ICD-10-CM

## 2017-11-15 MED FILL — HYDROCHLOROTHIAZIDE 25 MG T: 25 | 30 days supply | Qty: 30 | Fill #0

## 2017-11-15 MED FILL — AMLODIPINE BESYLATE 5 MG TA: 5 | 30 days supply | Qty: 30 | Fill #1

## 2018-01-02 MED FILL — HYDROCHLOROTHIAZIDE 25 MG T: 25 | 30 days supply | Qty: 30 | Fill #1

## 2018-01-02 MED FILL — AMLODIPINE BESYLATE 5 MG TA: 5 | 30 days supply | Qty: 30 | Fill #2

## 2018-01-08 DIAGNOSIS — M7662 Achilles tendinitis, left leg: Secondary | ICD-10-CM | POA: Diagnosis not present

## 2018-01-08 DIAGNOSIS — M7661 Achilles tendinitis, right leg: Secondary | ICD-10-CM | POA: Diagnosis not present

## 2018-01-08 DIAGNOSIS — M7750 Other enthesopathy of unspecified foot: Secondary | ICD-10-CM | POA: Diagnosis not present

## 2018-01-08 MED FILL — DICLOFENAC SOD EC 50 MG TAB: 50 | 25 days supply | Qty: 50 | Fill #0

## 2018-02-13 ENCOUNTER — Other Ambulatory Visit: Payer: Self-pay | Admitting: Internal Medicine

## 2018-02-13 MED FILL — DICLOFENAC SOD EC 50 MG TAB: 50 | 25 days supply | Qty: 50 | Fill #0

## 2018-02-13 MED FILL — HYDROCHLOROTHIAZIDE 25 MG T: 25 | 30 days supply | Qty: 30 | Fill #2

## 2018-02-22 MED FILL — AMLODIPINE BESYLATE 5 MG TA: 5 | 30 days supply | Qty: 30 | Fill #0

## 2018-03-08 ENCOUNTER — Emergency Department (HOSPITAL_COMMUNITY)
Admission: EM | Admit: 2018-03-08 | Discharge: 2018-03-08 | Disposition: A | Discharge status: Home / Self Care | Payer: Commercial Managed Care - PPO | Attending: Emergency Medicine | Admitting: Emergency Medicine

## 2018-03-08 ENCOUNTER — Encounter (HOSPITAL_COMMUNITY): Payer: Self-pay

## 2018-03-08 ENCOUNTER — Other Ambulatory Visit: Payer: Self-pay

## 2018-03-08 DIAGNOSIS — Z79899 Other long term (current) drug therapy: Secondary | ICD-10-CM | POA: Diagnosis not present

## 2018-03-08 DIAGNOSIS — I10 Essential (primary) hypertension: Secondary | ICD-10-CM | POA: Diagnosis not present

## 2018-03-08 DIAGNOSIS — R600 Localized edema: Secondary | ICD-10-CM | POA: Diagnosis present

## 2018-03-08 DIAGNOSIS — J45909 Unspecified asthma, uncomplicated: Secondary | ICD-10-CM | POA: Diagnosis not present

## 2018-03-08 DIAGNOSIS — T783XXA Angioneurotic edema, initial encounter: Secondary | ICD-10-CM | POA: Insufficient documentation

## 2018-03-08 MED ORDER — LORATADINE 10 MG PO TABS: 10.0000 mg | ORAL_TABLET | Freq: Every day | ORAL | 0 refills | Status: DC

## 2018-03-08 MED FILL — MEGESTROL 40 MG TABLET: 40 | 30 days supply | Qty: 30 | Fill #1

## 2018-03-08 NOTE — ED Triage Notes (Addendum)
Pt woke this am with her lips swollen, she also has c/o of generalized itching. The swelling in her lips has improved since waking up.  Pt says the last thing she ate last night were walnuts.  Pt states she has no food allergies.

## 2018-03-08 NOTE — ED Provider Notes (Signed)
[facility]-EMERGENCY DEPT Provider Note   CSN: [id number] Arrival date & time: 03/08/18  4098     History   Chief Complaint Chief Complaint  Patient presents with  . Oral Swelling  . Pruritis    HPI Mary Hanson is a 42 y.o. female.  HPI 42 year old female with a history of prior angioedema presents the emergency department with swelling of her upper and lower lips this morning upon awakening.  She states since then she is had significant improvement in her swelling.  She previously was on lisinopril but this was removed years ago with her first episode of angioedema.  The only new thing she had last night was walnuts from a gas station.  She is never had issues with tree nuts before.  She reports some mild itching.  No difficulty breathing or swallowing.  She reports her lip swelling is improving.  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   Past Medical History:  Diagnosis Date  . Allergy-induced asthma    prn inhaler  . History of anemia    no current problem, per pt.  . Hypertension    under control with med., has been on med. x 1 yr.  . Migraines   . Obesity   . Pyogenic granuloma 12/2013   right ring finger  . Seasonal allergies     Patient Active Problem List   Diagnosis Date Noted  . Nonintractable headache 01/14/2017  . Fatigue 02/10/2016  . Solitary pulmonary nodule 02/10/2016  . Shortness of breath 12/31/2015  . Menorrhagia 05/31/2015  . Asthma with acute exacerbation 03/01/2015  . Peripheral neuralgia 11/11/2014  . Enlarged thyroid 04/03/2013  . Unspecified vitamin D deficiency 07/29/2012  . External hemorrhoid 09/06/2011  . Essential hypertension 12/29/2010  . Morbid obesity (HCC) 12/29/2010  . Mild asthma exacerbation 12/29/2010  . Allergic rhinitis 12/29/2010  . Migraine headache 12/29/2010    Past Surgical History:  Procedure Laterality Date  . CERVICAL  CONE BIOPSY  age 60  . CHOLECYSTECTOMY  2006 or 2007  . DILATION AND CURETTAGE OF UTERUS    . DILATION AND EVACUATION  age 74  . MASS EXCISION Right 01/16/2014   Procedure: EXCISION PYOGENIC GRANULOMA FROM RIGHT RING FINGER;  Surgeon: Dairl Ponder, MD;  Location: [facility];  Service: Orthopedics;  Laterality: Right;     OB History    Gravida  1   Para      Term      Preterm      AB  1   Living  0     SAB      TAB  1   Ectopic      Multiple      Live Births               Home Medications    Prior to Admission medications   Medication Sig Start Date End Date Taking? Authorizing Provider  albuterol (PROVENTIL HFA;VENTOLIN HFA) 108 (90 Base) MCG/ACT inhaler Inhale 2 puffs into the lungs every 4 (four) hours as needed for wheezing or shortness of breath. 09/13/17   Reva Bores, MD  amLODipine (NORVASC) 5 MG tablet TAKE 1 TABLET BY MOUTH DAILY. 02/13/18   Reva Bores, MD  amoxicillin (AMOXIL) 500 MG capsule Take 1 capsule (500 mg total) by mouth 3 (three) times daily. Patient not taking: Reported on 03/08/2018 09/27/17   Reva Bores,  MD  aspirin-acetaminophen-caffeine (EXCEDRIN MIGRAINE) [id number] MG tablet Take 2 tablets by mouth every 6 (six) hours as needed for headache.    [provider]  benzonatate (TESSALON) 100 MG capsule Take 1 capsule (100 mg total) by mouth 2 (two) times daily as needed for cough. Patient not taking: Reported on 03/08/2018 09/27/17   Reva BoresPratt, Tanya S, MD  budesonide-formoterol [facility](SYMBICORT) 160-4.5 MCG/ACT inhaler Inhale 2 puffs into the lungs 2 (two) times daily. 09/27/17   Reva BoresPratt, Tanya S, MD  diclofenac (VOLTAREN) 50 MG EC tablet Take 50 mg by mouth 2 (two) times daily. 02/13/18   [provider]  eletriptan (RELPAX) 40 MG tablet Take 1 tablet (40 mg total) by mouth as needed for migraine or headache. May repeat in 2 hours if headache persists or recurs. 09/13/17   Reva BoresPratt, Tanya S, MD  fexofenadine [facility](ALLEGRA  ALLERGY) 180 MG tablet Take 1 tablet (180 mg total) by mouth daily. 09/27/17   Reva BoresPratt, Tanya S, MD  fluticasone (FLONASE) 50 MCG/ACT nasal spray Place 2 sprays into both nostrils daily for 10 days. 09/26/17 10/06/17  Benay PikeLeath, Christie Janell, NP  hydrochlorothiazide (HYDRODIURIL) 25 MG tablet TAKE 1 TABLET BY MOUTH DAILY. Patient taking differently: Take 25 mg by mouth daily.  11/15/17   Reva BoresPratt, Tanya S, MD  ibuprofen (ADVIL,MOTRIN) 200 MG tablet Take 800 mg by mouth every 6 (six) hours as needed for headache.    [provider]  loratadine (CLARITIN) 10 MG tablet Take 1 tablet (10 mg total) by mouth daily. 03/08/18   Azalia Bilisampos, Cora Brierley, MD  megestrol (MEGACE) 40 MG tablet Take 1 tablet (40 mg total) by mouth daily. 09/18/17   Poe, Deirdre C, CNM  montelukast (SINGULAIR) 10 MG tablet Take 1 tablet (10 mg total) by mouth at bedtime. 09/26/17 10/26/17  Benay PikeLeath, Christie Janell, NP  Norethindrone-Ethinyl Estradiol-Fe Biphas (LO LOESTRIN FE) 1 MG-10 MCG / 10 MCG tablet Take 1 tablet by mouth daily. 09/18/17   Reva BoresPratt, Tanya S, MD    Family History Family History  Problem Relation Age of Onset  . Diabetes type II Father   . Hypertension Father   . Hypertension Mother   . Goiter Sister [id number]       identical twin  . Asthma Sister   . Hypertension Sister   . Stroke Maternal Grandfather   . Diabetes Paternal Grandfather   . Heart disease Paternal Grandfather     Social History Social History   Tobacco Use  . Smoking status: Never Smoker  . Smokeless tobacco: Never Used  Substance Use Topics  . Alcohol use: No    Alcohol/week: 0.0 standard drinks    Comment: Wine once a year   . Drug use: No     Allergies   Iodine; Augmentin [amoxicillin-pot clavulanate]; Lisinopril; and Zofran [ondansetron hcl]   Review of Systems Review of Systems  All other systems reviewed and are negative.    Physical Exam Updated Vital Signs BP (!) 163/105   Pulse 77   Temp 97.7 F (36.5 C)   Resp 16   Ht 5\' 6"   (1.676 m)   Wt 136.1 kg   LMP 02/21/2018   SpO2 99%   BMI 48.42 kg/m   Physical Exam  Constitutional: She is oriented to person, place, and time. She appears well-developed and well-nourished.  HENT:  Head: Normocephalic.  Mild lower lip swelling.  No longer with any significant upper lip swelling.  Posterior pharynx is normal.  Tongue is normal in size.  Uvula is  midline and normal.  Tolerating secretions.  Oral airway patent.  No stridor.  Eyes: EOM are normal.  Neck: Normal range of motion. Neck supple. No thyromegaly present.  Cardiovascular: Normal rate.  Pulmonary/Chest: Effort normal and breath sounds normal.  Abdominal: She exhibits no distension.  Musculoskeletal: Normal range of motion.  Neurological: She is alert and oriented to person, place, and time.  Psychiatric: She has a normal mood and affect.  Nursing note and vitals reviewed.    ED Treatments / Results  Labs (all labs ordered are listed, but only abnormal results are displayed) Labs Reviewed - No data to display  EKG None  Radiology No results found.  Procedures Procedures (including critical care time)  Medications Ordered in ED Medications - No data to display   Initial Impression / Assessment and Plan / ED Course  I have reviewed the triage vital signs and the nursing notes.  Pertinent labs & imaging results that were available during my care of the patient were reviewed by me and considered in my medical decision making (see chart for details).     Resolving angioedema.  Recommended Claritin for nonsedating properties.  Recommended referral to allergy immunology if she would ever have a third episode.  No other medication changes at this time.  Final Clinical Impressions(s) / ED Diagnoses   Final diagnoses:  Angioedema of lips, initial encounter    ED Discharge Orders         Ordered    loratadine (CLARITIN) 10 MG tablet  Daily     03/08/18 0837           Azalia Bilis,  MD 03/08/18 [phone]

## 2018-03-27 MED FILL — AMLODIPINE BESYLATE 5 MG TA: 5 | 30 days supply | Qty: 30 | Fill #1

## 2018-03-27 MED FILL — HYDROCHLOROTHIAZIDE 25 MG T: 25 | 30 days supply | Qty: 30 | Fill #3

## 2018-04-10 ENCOUNTER — Other Ambulatory Visit: Payer: Self-pay | Admitting: Family Medicine

## 2018-04-10 DIAGNOSIS — J45901 Unspecified asthma with (acute) exacerbation: Secondary | ICD-10-CM

## 2018-04-15 MED FILL — SYMBICORT 160-4.5 MCG INH: 160-4.5 | 30 days supply | Qty: 10 | Fill #0

## 2018-04-15 MED FILL — DICLOFENAC SOD EC 50 MG TAB: 50 | 30 days supply | Qty: 30 | Fill #0

## 2018-04-15 MED FILL — VENTOLIN HFA 90 MCG INHALER: 108 (90 BAS | 25 days supply | Qty: 18 | Fill #0

## 2018-04-24 HISTORY — PX: BREAST BIOPSY: SHX20

## 2018-04-26 DIAGNOSIS — L2084 Intrinsic (allergic) eczema: Secondary | ICD-10-CM

## 2018-04-26 NOTE — Telephone Encounter (Signed)
Hii Dr. Shawnie Pons,  Could you put in a referral to dermatology for me I would like to have them look at my skin tags and I'm also noticing a little bit of what ecezma on my face.   Sincerely,  Mary Hanson  Copied from MyChart message--referral placed.

## 2018-04-30 MED FILL — HYDROCHLOROTHIAZIDE 25 MG T: 25 | 30 days supply | Qty: 30 | Fill #4

## 2018-04-30 MED FILL — AMLODIPINE BESYLATE 5 MG TA: 5 | 30 days supply | Qty: 30 | Fill #2

## 2018-05-13 MED FILL — DICLOFENAC SOD EC 50 MG TAB: 50 | 30 days supply | Qty: 30 | Fill #1

## 2018-05-16 ENCOUNTER — Other Ambulatory Visit: Payer: Self-pay

## 2018-05-16 ENCOUNTER — Ambulatory Visit (INDEPENDENT_AMBULATORY_CARE_PROVIDER_SITE_OTHER): Payer: 59 | Admitting: Family Medicine

## 2018-05-16 VITALS — BP 146/82 | HR 88 | Temp 98.3°F | Ht 66.0 in | Wt 310.8 lb

## 2018-05-16 DIAGNOSIS — J0100 Acute maxillary sinusitis, unspecified: Secondary | ICD-10-CM

## 2018-05-16 DIAGNOSIS — J4540 Moderate persistent asthma, uncomplicated: Secondary | ICD-10-CM | POA: Diagnosis not present

## 2018-05-16 DIAGNOSIS — L918 Other hypertrophic disorders of the skin: Secondary | ICD-10-CM | POA: Diagnosis not present

## 2018-05-16 MED ORDER — FEXOFENADINE HCL 180 MG PO TABS: 180.0000 mg | ORAL_TABLET | Freq: Every day | ORAL | 0 refills | Status: DC

## 2018-05-16 MED ORDER — MONTELUKAST SODIUM 10 MG PO TABS: 10.0000 mg | ORAL_TABLET | Freq: Every day | ORAL | 1 refills | Status: DC

## 2018-05-16 MED ORDER — FLUTICASONE PROPIONATE 50 MCG/ACT NA SUSP: 2.0000 | Freq: Every day | NASAL | 11 refills | Status: DC

## 2018-05-16 MED FILL — MONTELUKAST SOD 10 MG TAB: 10 | 90 days supply | Qty: 90 | Fill #0

## 2018-05-16 MED FILL — FLUTICASONE PROP 50 MCG SPR: 50 | 30 days supply | Qty: 16 | Fill #0

## 2018-05-16 NOTE — Assessment & Plan Note (Addendum)
Minimal scattered wheezing on exam. Doubt true exacerbation given subacute onset. Continue symbicort and PRN albuterol. Add back singulair and antihistamine - patient says allergra works best for her. Consider switching controller vs evaluation of OSA overlap if worsening.

## 2018-05-16 NOTE — Progress Notes (Signed)
  Subjective:     Patient ID: Mary Hanson, female   DOB: [birth year], 43 y.o.   MRN: [id number]  HPI Skin tags: On her side and underneath her breast. Been there for years. She will like to get them removed. Denies any pain or itching. There are about 6 of them, some of them has grown in size a bit.   Current Outpatient Medications on File Prior to Visit  Medication Sig Dispense Refill  . amLODipine (NORVASC) 5 MG tablet TAKE 1 TABLET BY MOUTH DAILY. 90 tablet 3  . aspirin-acetaminophen-caffeine (EXCEDRIN MIGRAINE) 250-250-65 MG tablet Take 2 tablets by mouth every 6 (six) hours as needed for headache.    . diclofenac (VOLTAREN) 50 MG EC tablet Take 50 mg by mouth 2 (two) times daily.  0  . eletriptan (RELPAX) 40 MG tablet Take 1 tablet (40 mg total) by mouth as needed for migraine or headache. May repeat in 2 hours if headache persists or recurs. 10 tablet 3  . hydrochlorothiazide (HYDRODIURIL) 25 MG tablet TAKE 1 TABLET BY MOUTH DAILY. (Patient taking differently: Take 25 mg by mouth daily. ) 60 tablet 3  . ibuprofen (ADVIL,MOTRIN) 200 MG tablet Take 800 mg by mouth every 6 (six) hours as needed for headache.    . SYMBICORT 160-4.5 MCG/ACT inhaler INHALE 2 PUFFS BY MOUTH INTO THE LUNGS TWICE A DAY 10.2 g 0  . VENTOLIN HFA 108 (90 Base) MCG/ACT inhaler INHALE 2 PUFFS BY MOUTH INTO THE LUNGS EVERY 4 HOURS AS NEEDED FOR WHEEZING OR SHORTNESS OF BREATH. 18 g 0   No current facility-administered medications on file prior to visit.    Past Medical History:  Diagnosis Date  . Allergy-induced asthma    prn inhaler  . History of anemia    no current problem, per pt.  . Hypertension    under control with med., has been on med. x 1 yr.  . Migraines   . Obesity   . Pyogenic granuloma 12/2013   right ring finger  . Seasonal allergies    Vitals:   05/16/18 1421  BP: (!) 146/82  Pulse: 88  Temp: 98.3 F (36.8 C)  TempSrc: Oral  SpO2: 99%  Weight: (!) 310 lb 12.8 oz (141 kg)  Height: 5\' 6"   (1.676 m)     Review of Systems  Constitutional: Negative.   Skin:       Skin lesion  All other systems reviewed and are negative.      Objective:   Physical Exam Vitals signs and nursing note reviewed.  Constitutional:      Appearance: She is not ill-appearing.  Skin:    Comments: Multiple skin tags, different size, similar consistency. See image  Neurological:     Mental Status: She is alert.            Assessment:     Multiple skin tags.    Plan:     Removal offered. She will like to think about the procedure. F/U as needed.

## 2018-05-16 NOTE — Progress Notes (Signed)
   Subjective:     Mary Hanson, is a 43 y.o. female   History provider by patient No interpreter necessary.  Chief Complaint  Patient presents with  . skin tags    HPI:  Mary Hanson is a 43yo F who presents with concern for skin tags. She reports small skin-colored pedunculated growths sarising on her R side and below her breasts over the last few years. She states she has seen about 6 of them, several of which have grown in size over the last year or two. She reports no pain, itching, or bleeding. She does not find them physically bothersome, but says she dislikes how they look. She has not tried anything to help reduce their size and is afraid of making them bleed.   Review of Systems  Constitutional: Negative for chills and fever.       Objective:     BP (!) 146/82   Pulse 88   Temp 98.3 F (36.8 C) (Oral)   Ht 5\' 6"  (1.676 m)   Wt (!) 310 lb 12.8 oz (141 kg)   SpO2 99%   BMI 50.16 kg/m   Physical Exam Skin:    Comments: 2-3cm skin tags located on R flank, R half of abdomen, several below breasts        Assessment & Plan:   Mary Hanson is a 43yo F who presents for removal of skin tags. We discussed the procedure and post-procedure care, including use of lidocaine injections. She reports apprehension with the needle for numbing and states she would like to return at a later date for removal.   Skin Tags -Provided return precautions and education regarding skin tags -Will schedule appt with dermatology clinic for removal of skin tags  Return if symptoms worsen or fail to improve.  Barbara Cower, Medical Student

## 2018-05-16 NOTE — Progress Notes (Signed)
   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 Minimal exercise over the last few months 2/2 foot and ankle pain.   The last few weeks she ahs used her albuterol 1-2 times perday symbicort regularly, not every day. Reports she was diagnosed with asthma many years ago. Also volunteers that she has put on 10 lbs recently. Not taking any antihistamines, never started Singulair. No recent URI that may have started this. Sleeping ok. No additional cough.   ROS: Denies CP, SOB, abdominal pain, dysuria, changes in BMs.   CC, SH/smoking status, and VS noted  Objective: BP (!) 146/82   Pulse 88   Temp 98.3 F (36.8 C) (Oral)   Ht 5\' 6"  (1.676 m)   Wt (!) 310 lb 12.8 oz (141 kg)   SpO2 99%   BMI 50.16 kg/m  Gen: NAD, alert, cooperative, and pleasant. HEENT: NCAT, EOMI, PERRL CV: RRR, no murmur Resp: CTAB, no wheezes, non-labored Ext: No edema, warm Neuro: Alert and oriented, Speech clear, No gross deficits  Assessment and plan:  Moderate persistent asthma without complication Minimal scattered wheezing on exam. Doubt true exacerbation given subacute onset. Continue symbicort and PRN albuterol. Add back singulair and antihistamine - patient says allergra works best for her. Consider switching controller vs evaluation of OSA overlap if worsening.    No orders of the defined types were placed in this encounter.   Meds ordered this encounter  Medications  . fluticasone (FLONASE) 50 MCG/ACT nasal spray    Sig: Place 2 sprays into both nostrils daily for 10 days.    Dispense:  16 g    Refill:  11  . montelukast (SINGULAIR) 10 MG tablet    Sig: Take 1 tablet (10 mg total) by mouth at bedtime for 30 days.    Dispense:  90 tablet    Refill:  1  . fexofenadine (ALLEGRA ALLERGY) 180 MG tablet    Sig: Take 1 tablet (180 mg total) by mouth daily.    Dispense:  90 tablet    Refill:  0    Loni Muse, MD,  PGY3 05/16/2018 7:34 PM

## 2018-05-16 NOTE — Patient Instructions (Signed)
It was a pleasure to see you today! Thank you for choosing [facility] for your primary care. Mary Hanson was seen for asthma.   Our plans for today were:  Please restart the singulair and allergra. If you notice continued wheezing, let us know!   You can continue to use your inhalers as ordered.   Best,  Dr. Chanetta Marshall

## 2018-05-16 NOTE — Patient Instructions (Signed)
Skin Tag, Adult    A skin tag (acrochordon) is a soft, extra growth of skin. Most skin tags are flesh-colored and rarely bigger than a pencil eraser. They commonly form near areas where there are folds in the skin, such as the armpit or groin. Skin tags are not dangerous, and they do not spread from person to person (are not contagious).  You may have one skin tag or several. Skin tags do not require treatment. However, your health care provider may recommend removal of a skin tag if it:   Gets irritated from clothing.   Bleeds.   Is visible and unsightly.  Your health care provider can remove skin tags with a simple surgical procedure or a procedure that involves freezing the skin tag.  Follow these instructions at home:   Watch for any changes in your skin tag. A normal skin tag does not require any other special care at home.   Take over-the-counter and prescription medicines only as told by your health care provider.   Keep all follow-up visits as told by your health care provider. This is important.  Contact a health care provider if:   You have a skin tag that:  ? Becomes painful.  ? Changes color.  ? Bleeds.  ? Swells.   You develop more skin tags.  This information is not intended to replace advice given to you by your health care provider. Make sure you discuss any questions you have with your health care provider.  Document Released: 04/25/2015 Document Revised: 12/05/2015 Document Reviewed: 04/25/2015  Elsevier Interactive Patient Education  2019 Elsevier Inc[employer]

## 2018-05-31 MED FILL — AMLODIPINE BESYLATE 5 MG TA: 5 | 30 days supply | Qty: 30 | Fill #3 | Status: TO

## 2018-05-31 MED FILL — HYDROCHLOROTHIAZIDE 25 MG T: 25 | 30 days supply | Qty: 30 | Fill #5 | Status: TO

## 2018-06-10 MED FILL — DICLOFENAC SOD EC 50 MG TAB: 50 | 30 days supply | Qty: 30 | Fill #2

## 2018-06-19 ENCOUNTER — Other Ambulatory Visit: Payer: Self-pay | Admitting: Family Medicine

## 2018-06-19 MED FILL — METHOCARBAMOL 500 MG TABLET: 500 | 10 days supply | Qty: 30 | Fill #1

## 2018-06-19 MED FILL — MEGESTROL 40 MG TABLET: 40 | 30 days supply | Qty: 30 | Fill #2

## 2018-07-06 MED FILL — HYDROCHLOROTHIAZIDE 25 MG T: 25 | 30 days supply | Qty: 30 | Fill #0 | Status: TO

## 2018-07-06 MED FILL — AMLODIPINE BESYLATE 5 MG TA: 5 | 30 days supply | Qty: 30 | Fill #0 | Status: TO

## 2018-07-09 MED FILL — MEGESTROL 40 MG TABLET: 40 | 30 days supply | Qty: 30 | Fill #3

## 2018-07-15 ENCOUNTER — Telehealth (INDEPENDENT_AMBULATORY_CARE_PROVIDER_SITE_OTHER): Payer: Self-pay | Admitting: Family Medicine

## 2018-07-15 DIAGNOSIS — J4540 Moderate persistent asthma, uncomplicated: Secondary | ICD-10-CM

## 2018-07-15 MED ORDER — PREDNISONE 20 MG PO TABS: 40.0000 mg | ORAL_TABLET | Freq: Every day | ORAL | 0 refills | Status: AC

## 2018-07-15 MED FILL — predniSONE 20 MG TABS: 20 | 5 days supply | Qty: 10 | Fill #0

## 2018-07-15 NOTE — Telephone Encounter (Signed)
[facility] Telemedicine Visit  Patient consented to have visit conducted via telephone.  Encounter participants: Patient: Mary Hanson  Provider: Oralia Manis  Others (if applicable): None  Chief Complaint: asthma exaceration  HPI: Asthma Patient calling for concerns of asthma exacerbation. States she normally has allergy induced asthma and notes symptoms are always worsened with pollen exposure. Patient notes that for past week she has had to use her inhaler more frequently (twice a day when normally she only uses inhaler at a max of once a week). States she is using symbicort and singulair daily. Taking Allegra OTC as well. Denies SOB. Is speaking full sentences on the phone without becoming SOB. Does report increased wheezing. Patient states that symptoms improved after taking allegra and her singulair (which she started this week).   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 Denies fever or body aches. Has been on administrative leave from work all week.   ROS: See HPI, other ROS negative  Pertinent PMHx: asthma, allergic rhinitis   Assessment/Plan:  Moderate persistent asthma without complication Patient with likely asthma exacerbation 2/2 increased pollen exposure. Symptoms improved with daily allegra use and singulair use which is consistent with allergy induced asthma. No fever or body aches. 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 Will give trial of prednisone 40mg  x5 days for acute exacerbation and encouraged patient continue allegra as well. Strict return precautions given. 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 Patient is speaking full sentences on phone with no audible increased WOB.     Time spent on phone with patient: 15 minutes Charged for call Note sent to preceptor, Dr. Gwendolyn Grant

## 2018-07-15 NOTE — Assessment & Plan Note (Signed)
Patient with likely asthma exacerbation 2/2 increased pollen exposure. Symptoms improved with daily allegra use and singulair use which is consistent with allergy induced asthma. No fever or body aches. 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 Will give trial of prednisone 40mg  x5 days for acute exacerbation and encouraged patient continue allegra as well. Strict return precautions given. 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 Patient is speaking full sentences on phone with no audible increased WOB.

## 2018-07-24 ENCOUNTER — Telehealth: Payer: Self-pay | Admitting: Family Medicine

## 2018-07-24 MED ORDER — FAMOTIDINE 20 MG PO TABS: 20.0000 mg | ORAL_TABLET | Freq: Two times a day (BID) | ORAL | 0 refills | Status: DC

## 2018-07-24 MED FILL — FAMOTIDINE 20 MG TABLET: 20 | 30 days supply | Qty: 60 | Fill #0

## 2018-07-24 NOTE — Telephone Encounter (Signed)
Pt calling saying she talked to tele health provider earlier this week, was treated with pred 40mg  daily x 5 days and she is "not bouncing back." She is wondering about whether her maintenance medicine is working well enough. Yesterday she was SOB after being exposed to some cleaning chemicals at work + pollen. She used her symbicort 4 puffs, albuterol 2 puffs. She has been coughing with allergies.  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 She doesn't think she has seen any COVID or high risk patients. She has been taking [facility] brand cold medication. She feels like she has a dry cough, normal temp. She feels like she is still wheezing some. Using humidifier at home. Albuterol is 2 puffs in the morning, 2 puffs in the afternoon, and 2 puffs before bed. She has been also having heartburn. Is using flonase and singulair as directed. Speaking in long sentences on the phone without SOB or wheezing.   Plan:  Dry cough: doubt asthma exacerbation, was recently treated for such. Question seasonal allergies causing cough vs GERD.  Schedule albuterol every four hours.  Continue symbicort 2 puffs twice per day Start pepcid 20mg  BID.   In the long term, I wonder whether she needs to see Dr. Raymondo Band for PFTs vs pulm if continued frequent exacerbations.   She will call us back on Friday to let us know how this is going. Call anytime if worsening. Routing to PCP as FYI.   Loni Muse, MD

## 2018-08-05 ENCOUNTER — Other Ambulatory Visit: Payer: Self-pay | Admitting: Family Medicine

## 2018-08-05 DIAGNOSIS — J45901 Unspecified asthma with (acute) exacerbation: Secondary | ICD-10-CM

## 2018-08-05 MED FILL — MONTELUKAST SOD 10 MG TAB: 10 | 90 days supply | Qty: 90 | Fill #1

## 2018-08-05 MED FILL — HYDROCHLOROTHIAZIDE 25 MG T: 25 | 30 days supply | Qty: 30 | Fill #0

## 2018-08-05 MED FILL — SYMBICORT 160-4.5 MCG INH: 160-4.5 | 30 days supply | Qty: 10 | Fill #0

## 2018-08-05 MED FILL — AMLODIPINE BESYLATE 5 MG TA: 5 | 30 days supply | Qty: 30 | Fill #0

## 2018-08-17 ENCOUNTER — Ambulatory Visit (HOSPITAL_COMMUNITY)
Admission: EM | Admit: 2018-08-17 | Discharge: 2018-08-17 | Disposition: A | Discharge status: Home / Self Care | Payer: BC Managed Care – PPO | Attending: Family Medicine | Admitting: Family Medicine

## 2018-08-17 ENCOUNTER — Other Ambulatory Visit: Payer: Self-pay

## 2018-08-17 DIAGNOSIS — S39012A Strain of muscle, fascia and tendon of lower back, initial encounter: Secondary | ICD-10-CM | POA: Diagnosis not present

## 2018-08-17 MED ORDER — CYCLOBENZAPRINE HCL 10 MG PO TABS: ORAL_TABLET | ORAL | 0 refills | Status: DC

## 2018-08-17 MED ORDER — DICLOFENAC SODIUM 50 MG PO TBEC: 50.0000 mg | DELAYED_RELEASE_TABLET | Freq: Two times a day (BID) | ORAL | 0 refills | Status: DC

## 2018-08-17 NOTE — ED Provider Notes (Signed)
[facility]   [id number] 08/17/18 Arrival Time: 1219  ASSESSMENT & PLAN:  1. Strain of lumbar region, initial encounter    No indication for imaging of back at this time given no trauma and normal neurological exam. Discussed. See AVS for d/c instructions.  Meds ordered this encounter  Medications  . diclofenac (VOLTAREN) 50 MG EC tablet    Sig: Take 1 tablet (50 mg total) by mouth 2 (two) times daily.    Dispense:  20 tablet    Refill:  0  . cyclobenzaprine (FLEXERIL) 10 MG tablet    Sig: Take 1 tablet by mouth 3 times daily as needed for muscle spasm. Warning: May cause drowsiness.    Dispense:  21 tablet    Refill:  0   Medication sedation precautions given. Encourage ROM/movement as tolerated.  Follow-up Information    Reva BoresPratt, Tanya S, MD.   Specialty:  Obstetrics and Gynecology Why:  As needed. Contact information: [address] [place] [address] [phone]        [facility].   Specialty:  Urgent Care Why:  As needed. Contact information: [address] [place] [id number] [phone]          Reviewed expectations re: course of current medical issues. Questions answered. Outlined signs and symptoms indicating need for more acute intervention. Patient verbalized understanding. After Visit Summary given.   SUBJECTIVE: History from: patient.  Mary Hanson is a 43 y.o. female who presents with complaint of persistent bilateral lower back discomfort. Onset abrupt, 3-4 days ago. Injury/trama: no, reports walking in high heels at funeral; stuck in mud; had to pull her feet out. After three hour drive started noticing low back discomfort. History of back problems: no. Discomfort described as stiffness without radiation. Pain worsened with certain movements/ambulation and improved with rest. Progressive LE weakness or saddle anesthesia: none. Extremity sensation changes or weakness: none.  Ambulatory without difficulty. Normal bowel/bladder habits: yes without urinary retention. No associated abdominal pain/n/v. Self treatment: has tried OTC ibuprofen with mild help.  Reports no chronic steroid use, fevers, IV drug use, or recent back surgeries or procedures.  ROS: As per HPI. All other systems negative.   OBJECTIVE:  Vitals:   08/17/18 1240  BP: (!) 150/90  Resp: 16  Temp: 98.5 F (36.9 C)  TempSrc: Oral  SpO2: 98%    General appearance: alert; no distress Neck: supple with FROM; without midline tenderness Lungs: unlabored respirations Abdomen: soft, non-tender; non-distended Back: moderate bilateral tenderness of her lower paraspinal musculature; FROM at waist; bruising: none; without midline tenderness Extremities: no edema; symmetrical with no gross deformities; normal ROM of bilateral lower extremities Skin: warm and dry Neurologic: normal gait; normal sensation of RLE and LLE; normal strength of RLE and LLE Psychological: alert and cooperative; normal mood and affect   Allergies  Allergen Reactions  . Iodine Nausea And Vomiting  . Augmentin [Amoxicillin-Pot Clavulanate] Nausea And Vomiting  . Lisinopril Swelling    Lip swelling/Angioedema  . Zofran [Ondansetron Hcl] Other (See Comments)    Severe headache    Past Medical History:  Diagnosis Date  . Allergy-induced asthma    prn inhaler  . History of anemia    no current problem, per pt.  . Hypertension    under control with med., has been on med. x 1 yr.  . Migraines   . Obesity   . Pyogenic granuloma 12/2013   right ring finger  .  Seasonal allergies    Social History   Socioeconomic History  . Marital status: Single    Spouse name: Not on file  . Number of children: 0  . Years of education: Ba/Grad [place] [employer]  . Highest education level: Not on file  Occupational History    Employer: [employer]  Social Needs  . Financial resource strain: Not on file  . Food insecurity:    Worry: Not on  file    Inability: Not on file  . Transportation needs:    Medical: Not on file    Non-medical: Not on file  Tobacco Use  . Smoking status: Never Smoker  . Smokeless tobacco: Never Used  Substance and Sexual Activity  . Alcohol use: No    Alcohol/week: 0.0 standard drinks    Comment: Wine once a year   . Drug use: No  . Sexual activity: Yes    Birth control/protection: Condom, Pill    Comment: quit BCP whils on megace 7/14  Lifestyle  . Physical activity:    Days per week: Not on file    Minutes per session: Not on file  . Stress: Not on file  Relationships  . Social connections:    Talks on phone: Not on file    Gets together: Not on file    Attends religious service: Not on file    Active member of club or organization: Not on file    Attends meetings of clubs or organizations: Not on file    Relationship status: Not on file  . Intimate partner violence:    Fear of current or ex partner: Not on file    Emotionally abused: Not on file    Physically abused: Not on file    Forced sexual activity: Not on file  Other Topics Concern  . Not on file  Social History Narrative   Lives at home with herself.   Right handed.   Caffeine use: Drinks soda daily (Drinks three 20oz per day)   Family History  Problem Relation Age of Onset  . Diabetes type II Father   . Hypertension Father   . Hypertension Mother   . Goiter Sister 1       identical twin  . Asthma Sister   . Hypertension Sister   . Stroke Maternal Grandfather   . Diabetes Paternal Grandfather   . Heart disease Paternal Grandfather    Past Surgical History:  Procedure Laterality Date  . CERVICAL CONE BIOPSY  age 73  . CHOLECYSTECTOMY  2006 or 2007  . DILATION AND CURETTAGE OF UTERUS    . DILATION AND EVACUATION  age 13  . MASS EXCISION Right 01/16/2014   Procedure: EXCISION PYOGENIC GRANULOMA FROM RIGHT RING FINGER;  Surgeon: Dairl Ponder, MD;  Location:  SURGERY CENTER;  Service: Orthopedics;   Laterality: Right;     Mardella Layman, MD 08/17/18 1335

## 2018-08-17 NOTE — Discharge Instructions (Signed)

## 2018-08-17 NOTE — ED Triage Notes (Signed)
Per pt she was at a funeral outside and stepped down wrong with her heels on and something twisted. Pt drove for about 3 hrs home with discomfort. When she woke up Thursday morning she could barely move and has been taking Aleve with some relief.

## 2018-09-13 MED FILL — ELETRIPTAN HBR 40 MG TABLET: 40 | 30 days supply | Qty: 10 | Fill #1

## 2018-09-18 ENCOUNTER — Other Ambulatory Visit: Payer: Self-pay

## 2018-09-18 ENCOUNTER — Ambulatory Visit: Payer: BC Managed Care – PPO | Admitting: Family Medicine

## 2018-09-18 ENCOUNTER — Encounter: Payer: Self-pay | Admitting: Family Medicine

## 2018-09-18 VITALS — BP 132/80 | HR 105

## 2018-09-18 DIAGNOSIS — R350 Frequency of micturition: Secondary | ICD-10-CM | POA: Diagnosis not present

## 2018-09-18 DIAGNOSIS — N898 Other specified noninflammatory disorders of vagina: Secondary | ICD-10-CM

## 2018-09-18 LAB — POCT URINALYSIS DIP (MANUAL ENTRY)
Bilirubin, UA: NEGATIVE
Blood, UA: NEGATIVE
Glucose, UA: NEGATIVE mg/dL
Ketones, POC UA: NEGATIVE mg/dL
Leukocytes, UA: NEGATIVE
Nitrite, UA: NEGATIVE
Protein Ur, POC: NEGATIVE mg/dL
Spec Grav, UA: 1.025 (ref 1.010–1.025)
Urobilinogen, UA: 1 E.U./dL
pH, UA: 5.5 (ref 5.0–8.0)

## 2018-09-18 LAB — POCT WET PREP (WET MOUNT)
Clue Cells Wet Prep Whiff POC: NEGATIVE
Trichomonas Wet Prep HPF POC: ABSENT

## 2018-09-18 NOTE — Assessment & Plan Note (Signed)
Likely due to tissue trauma during sexual intercourse.  Discussed strategies to prevent this including using water-based lubricant, given that patient is using condoms for birth control.  Discussed importance of daily fingernails very short and trimmed.  2 well-healing superficial lacerations 5 and 7 o'clock position.  Gave patient return precautions.

## 2018-09-18 NOTE — Progress Notes (Signed)
U

## 2018-09-18 NOTE — Assessment & Plan Note (Signed)
No glucose noted on UA.  UA without any leukocytes, nitrites, or any other concerning findings.  Likely chalked up to vaginal discomfort, patient to follow-up in clinic if symptoms persist.

## 2018-09-18 NOTE — Patient Instructions (Addendum)
It was great meeting you today!  I think your spotting and symptoms are due to very superficial vaginal lacerations the likely sustained during sex.  We briefly discussed hygiene to prevent these from happening.  We performed a urinalysis and a wet prep.  These resulted in no abnormalities.  Your urinalysis was without any issues and your wet prep was without any problems as well.

## 2018-09-18 NOTE — Progress Notes (Signed)
   HPI 43 year old female who presents for 3-day history of vaginal discomfort, mild burning with urination, and some increased frequency of urination.  She states her symptoms all started after she had sexual intercourse on 09/15/2018.  States that she previously had a vaginal laceration from vaginal intercourse and had similar symptoms to this.  She has had no discharge.  She has had the same sexual partner for 7 years and they always use condoms.   CC: Vaginal discomfort   ROS:  Review of Systems See HPI for ROS.   CC, SH/smoking status, and VS noted  Objective: BP 132/80   Pulse (!) 105   SpO2 63%  Gen: 43 year old African female, no acute distress, resting comfortably CV: Skin warm and dry Resp: Nonlabored respirations, no sensory muscle use Neuro: AO x3, no focal neurologic deficit GU: Superficial grade 1 vaginal laceration at 5 and 7 o'clock position, well-healing, hemostatic.  No vaginal discharge.   Assessment and plan:  Urine frequency No glucose noted on UA.  UA without any leukocytes, nitrites, or any other concerning findings.  Likely chalked up to vaginal discomfort, patient to follow-up in clinic if symptoms persist.  Vaginal irritation Likely due to tissue trauma during sexual intercourse.  Discussed strategies to prevent this including using water-based lubricant, given that patient is using condoms for birth control.  Discussed importance of daily fingernails very short and trimmed.  2 well-healing superficial lacerations 5 and 7 o'clock position.  Gave patient return precautions.   Orders Placed This Encounter  Procedures  . POCT urinalysis dipstick  . POCT Wet Prep [facility])    No orders of the defined types were placed in this encounter.    Myrene Buddy MD PGY-2 Family Medicine Resident  09/18/2018 3:45 PM

## 2018-09-26 ENCOUNTER — Other Ambulatory Visit: Payer: Self-pay | Admitting: Family Medicine

## 2018-09-26 DIAGNOSIS — I10 Essential (primary) hypertension: Secondary | ICD-10-CM

## 2018-09-26 MED FILL — AMLODIPINE BESYLATE 5 MG TA: 5 | 30 days supply | Qty: 30 | Fill #1

## 2018-09-26 MED FILL — HYDROCHLOROTHIAZIDE 25 MG T: 25 | 60 days supply | Qty: 60 | Fill #0

## 2018-10-07 ENCOUNTER — Encounter: Payer: Self-pay | Admitting: Family Medicine

## 2018-10-07 ENCOUNTER — Other Ambulatory Visit: Payer: Self-pay

## 2018-10-07 ENCOUNTER — Ambulatory Visit: Payer: BC Managed Care – PPO | Admitting: Family Medicine

## 2018-10-07 VITALS — BP 145/82 | HR 90 | Wt 323.4 lb

## 2018-10-07 DIAGNOSIS — B354 Tinea corporis: Secondary | ICD-10-CM | POA: Diagnosis not present

## 2018-10-07 DIAGNOSIS — N939 Abnormal uterine and vaginal bleeding, unspecified: Secondary | ICD-10-CM | POA: Insufficient documentation

## 2018-10-07 LAB — POCT URINE PREGNANCY: Preg Test, Ur: NEGATIVE

## 2018-10-07 MED ORDER — NYSTATIN 100000 UNIT/GM EX OINT: 1.0000 "application " | TOPICAL_OINTMENT | Freq: Two times a day (BID) | CUTANEOUS | 0 refills | Status: DC

## 2018-10-07 MED ORDER — MEGESTROL ACETATE 40 MG PO TABS: 40.0000 mg | ORAL_TABLET | Freq: Two times a day (BID) | ORAL | 3 refills | Status: DC

## 2018-10-07 MED FILL — MEGESTROL 40 MG TABLET: 40 | 15 days supply | Qty: 30 | Fill #0

## 2018-10-07 MED FILL — NYSTATIN 100,000 UNITS/GM O: 100000 | 10 days supply | Qty: 30 | Fill #0

## 2018-10-07 NOTE — Assessment & Plan Note (Signed)
Check u/s for endometrial thickness. Refilled megace, continue prn. Check labs. Discussed pregnancy--still wants to have a baby--limits based on age. R/o peri-menopause, pregnancy

## 2018-10-07 NOTE — Progress Notes (Signed)
   Subjective:    Patient ID: Mary Hanson is a 43 y.o. female presenting with No chief complaint on file.  on 10/07/2018  HPI: Has long h/o abnormal bleeding. Was having normal cycles until February and March was heavy. Began Megace for similar in April. No cycle since. Reports vaginal dryness. Has put on some extra weight of late. Wants to get back in the gym.  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 Reports some itching/irritation in her groin. Responded well to Nystatin ointment last time.  Review of Systems  Constitutional: Negative for chills and fever.  Respiratory: Negative for shortness of breath.   Cardiovascular: Negative for chest pain.  Gastrointestinal: Negative for abdominal pain, nausea and vomiting.  Genitourinary: Negative for dysuria.  Skin: Negative for rash.      Objective:    BP (!) 145/82   Pulse 90   Wt (!) 323 lb 6.4 oz (146.7 kg)   BMI 52.20 kg/m  Physical Exam Constitutional:      General: She is not in acute distress.    Appearance: She is well-developed.  HENT:     Head: Normocephalic and atraumatic.  Eyes:     General: No scleral icterus. Neck:     Musculoskeletal: Neck supple.  Cardiovascular:     Rate and Rhythm: Normal rate.  Pulmonary:     Effort: Pulmonary effort is normal.  Abdominal:     Palpations: Abdomen is soft.  Skin:    General: Skin is warm and dry.  Neurological:     Mental Status: She is alert and oriented to person, place, and time.         Assessment & Plan:   Problem List Items Addressed This Visit      Unprioritized   Abnormal vaginal bleeding - Primary    Check u/s for endometrial thickness. Refilled megace, continue prn. Check labs. Discussed pregnancy--still wants to have a baby--limits based on age. R/o peri-menopause, pregnancy      Relevant Medications   megestrol (MEGACE) 40 MG tablet   Other Relevant Orders   POCT urine pregnancy (Completed)   US PELVIC COMPLETE WITH TRANSVAGINAL   Follicle stimulating hormone   TSH   Prolactin   Beta hCG quant (ref lab)    Other Visit Diagnoses    Tinea corporis       Refilled Nystatin   Relevant Medications   nystatin ointment (MYCOSTATIN)      Total face-to-face time with patient: 25 minutes. Over 50% of encounter was spent on counseling and coordination of care. Return if symptoms worsen or fail to improve.  Donnamae Jude 10/07/2018 2:40 PM

## 2018-10-07 NOTE — Progress Notes (Signed)
Patient states she hasnt had a period for three months.  Patient does have some occasional spotting. Kathrene Alu RN

## 2018-10-08 LAB — FOLLICLE STIMULATING HORMONE: FSH: 3.3 m[IU]/mL

## 2018-10-08 LAB — BETA HCG QUANT (REF LAB): hCG Quant: <1 m[IU]/mL

## 2018-10-08 LAB — PROLACTIN: Prolactin: 26.4 ng/mL — ABNORMAL HIGH (ref 4.8–23.3)

## 2018-10-08 LAB — TSH: TSH: 3.04 u[IU]/mL (ref 0.450–4.500)

## 2018-10-08 NOTE — Addendum Note (Signed)
Addended by: Donnamae Jude on: 10/08/2018 07:56 AM   Modules accepted: Orders

## 2018-10-15 ENCOUNTER — Other Ambulatory Visit: Payer: Self-pay

## 2018-10-15 ENCOUNTER — Ambulatory Visit (HOSPITAL_COMMUNITY)
Admission: RE | Admit: 2018-10-15 | Discharge: 2018-10-15 | Disposition: A | Discharge status: Home / Self Care | Payer: BC Managed Care – PPO | Source: Ambulatory Visit | Attending: Family Medicine | Admitting: Family Medicine

## 2018-10-15 DIAGNOSIS — N939 Abnormal uterine and vaginal bleeding, unspecified: Secondary | ICD-10-CM | POA: Diagnosis not present

## 2018-10-29 ENCOUNTER — Ambulatory Visit: Payer: BC Managed Care – PPO | Admitting: Family Medicine

## 2018-10-29 ENCOUNTER — Other Ambulatory Visit: Payer: Self-pay

## 2018-10-29 ENCOUNTER — Encounter: Payer: Self-pay | Admitting: Family Medicine

## 2018-10-29 VITALS — BP 152/72 | HR 108

## 2018-10-29 DIAGNOSIS — J301 Allergic rhinitis due to pollen: Secondary | ICD-10-CM | POA: Diagnosis not present

## 2018-10-29 DIAGNOSIS — J0111 Acute recurrent frontal sinusitis: Secondary | ICD-10-CM | POA: Diagnosis not present

## 2018-10-29 DIAGNOSIS — J4540 Moderate persistent asthma, uncomplicated: Secondary | ICD-10-CM | POA: Diagnosis not present

## 2018-10-29 MED ORDER — AMOXICILLIN 500 MG PO CAPS: 500.0000 mg | ORAL_CAPSULE | Freq: Three times a day (TID) | ORAL | 2 refills | Status: DC

## 2018-10-29 MED ORDER — FLUTICASONE PROPIONATE 50 MCG/ACT NA SUSP: 2.0000 | Freq: Every day | NASAL | 2 refills | Status: DC

## 2018-10-29 MED FILL — FLUTICASONE PROP 50 MCG SPR: 50 | 30 days supply | Qty: 16 | Fill #0

## 2018-10-29 MED FILL — AMOXICILLIN 500 MG CAPSULE: 500 | 7 days supply | Qty: 21 | Fill #0

## 2018-10-29 NOTE — Assessment & Plan Note (Signed)
rx for Flonase and Amoxil, in case she has infection. Awaiting COVID testing. Please inform us if positive.

## 2018-10-29 NOTE — Patient Instructions (Signed)

## 2018-10-29 NOTE — Progress Notes (Signed)
   Subjective:    Patient ID: Mary Hanson is a 43 y.o. female presenting with No chief complaint on file.  on 10/29/2018  HPI: Reports congestion and allergies acting up. Thought the dust made her feel bad. Began taking her Allegra and for the past week and felt better. Then she has stopped taking this again. Then she started having nasal congestion and headache. Denies fever, no cough, no SOB. States she feels like she has been hit by a truck and she has a headache. She is out of flonase.  Review of Systems  Constitutional: Negative for chills and fever.  Respiratory: Negative for shortness of breath.   Cardiovascular: Negative for chest pain.  Gastrointestinal: Negative for abdominal pain, nausea and vomiting.  Genitourinary: Negative for dysuria.  Skin: Negative for rash.      Objective:    There were no vitals taken for this visit. Physical Exam Constitutional:      General: She is not in acute distress.    Appearance: She is well-developed.  HENT:     Head: Normocephalic and atraumatic.  Eyes:     General: No scleral icterus. Neck:     Musculoskeletal: Neck supple.  Cardiovascular:     Rate and Rhythm: Normal rate.  Pulmonary:     Effort: Pulmonary effort is normal.     Breath sounds: Normal breath sounds.  Abdominal:     Palpations: Abdomen is soft.  Skin:    General: Skin is warm and dry.  Neurological:     Mental Status: She is alert and oriented to person, place, and time.         Assessment & Plan:   Problem List Items Addressed This Visit      Unprioritized   Allergic rhinitis    rx for Flonase and Amoxil, in case she has infection. Awaiting COVID testing. Please inform us if positive.      Moderate persistent asthma without complication    Other Visit Diagnoses    Acute recurrent frontal sinusitis    -  Primary   Relevant Medications   fluticasone (FLONASE) 50 MCG/ACT nasal spray   amoxicillin (AMOXIL) 500 MG capsule      Total  face-to-face time with patient: 15 minutes. Over 50% of encounter was spent on counseling and coordination of care. Return if symptoms worsen or fail to improve.  Donnamae Jude 10/29/2018 2:17 PM

## 2018-11-01 MED FILL — AMLODIPINE BESYLATE 5 MG TA: 5 | 30 days supply | Qty: 30 | Fill #2

## 2018-11-04 MED ORDER — FLUCONAZOLE 150 MG PO TABS: 150.0000 mg | ORAL_TABLET | Freq: Every day | ORAL | 2 refills | Status: DC

## 2018-11-04 MED FILL — FLUCONAZOLE 150 MG TABS: 150 | 2 days supply | Qty: 2 | Fill #0

## 2018-11-21 ENCOUNTER — Encounter (HOSPITAL_COMMUNITY): Payer: Self-pay | Admitting: Family Medicine

## 2018-11-21 ENCOUNTER — Ambulatory Visit (HOSPITAL_COMMUNITY)
Admission: EM | Admit: 2018-11-21 | Discharge: 2018-11-21 | Disposition: A | Discharge status: Home / Self Care | Payer: BC Managed Care – PPO | Attending: Family Medicine | Admitting: Family Medicine

## 2018-11-21 ENCOUNTER — Other Ambulatory Visit: Payer: Self-pay

## 2018-11-21 DIAGNOSIS — G5622 Lesion of ulnar nerve, left upper limb: Secondary | ICD-10-CM

## 2018-11-21 MED ORDER — DEXAMETHASONE 1 MG/ML PO CONC
10.0000 mg | Freq: Once | ORAL | Status: AC
  Administered 2018-11-21: 10 mg via ORAL

## 2018-11-21 MED ORDER — DEXAMETHASONE 10 MG/ML FOR PEDIATRIC ORAL USE
INTRAMUSCULAR | Status: AC
  Filled 2018-11-21: qty 1

## 2018-11-21 NOTE — Discharge Instructions (Addendum)
The problem is usually very temporary and results from pressure on the ulnar nerve at the wrist  Expect resolution in 2 days or less.

## 2018-11-21 NOTE — ED Provider Notes (Signed)
[facility]    CSN: [id number] Arrival date & time: 11/21/18  1618     History   Chief Complaint Chief Complaint  Patient presents with  . Numbness    bilateral hands    HPI Mary Hanson is a 43 y.o. female.   [age] yo established [facility] patient   Pt reports numbness and tingling in her hands left side in ring and pinky fingers.  This started this morning when she woke up.  She states she slept in a recliner last night in the hospital, staying with her nephew.  No neck pain, fever, or weakness in hand muscles.       Past Medical History:  Diagnosis Date  . Allergy-induced asthma    prn inhaler  . History of anemia    no current problem, per pt.  . Hypertension    under control with med., has been on med. x 1 yr.  . Migraines   . Obesity   . Pyogenic granuloma 12/2013   right ring finger  . Seasonal allergies     Patient Active Problem List   Diagnosis Date Noted  . Abnormal vaginal bleeding 10/07/2018  . Urine frequency 09/18/2018  . Vaginal irritation 09/18/2018  . Moderate persistent asthma without complication 05/16/2018  . Nonintractable headache 01/14/2017  . Fatigue 02/10/2016  . Solitary pulmonary nodule 02/10/2016  . Shortness of breath 12/31/2015  . Menorrhagia 05/31/2015  . Peripheral neuralgia 11/11/2014  . Enlarged thyroid 04/03/2013  . Unspecified vitamin D deficiency 07/29/2012  . External hemorrhoid 09/06/2011  . Essential hypertension 12/29/2010  . Morbid obesity (HCC) 12/29/2010  . Allergic rhinitis 12/29/2010  . Migraine headache 12/29/2010    Past Surgical History:  Procedure Laterality Date  . CERVICAL CONE BIOPSY  age 43  . CHOLECYSTECTOMY  2006 or 2007  . DILATION AND CURETTAGE OF UTERUS    . DILATION AND EVACUATION  age 43  . MASS EXCISION Right 01/16/2014   Procedure: EXCISION PYOGENIC GRANULOMA FROM RIGHT RING FINGER;  Surgeon: Dairl PonderMatthew Weingold, MD;  Location: [facility];  Service: Orthopedics;   Laterality: Right;    OB History    Gravida  1   Para      Term      Preterm      AB  1   Living  0     SAB      TAB  1   Ectopic      Multiple      Live Births               Home Medications    Prior to Admission medications   Medication Sig Start Date End Date Taking? Authorizing Provider  amLODipine (NORVASC) 5 MG tablet TAKE 1 TABLET BY MOUTH DAILY. 02/13/18  Yes Reva BoresPratt, Tanya S, MD  hydrochlorothiazide (HYDRODIURIL) 25 MG tablet TAKE 1 TABLET BY MOUTH DAILY. 09/26/18  Yes Reva BoresPratt, Tanya S, MD  SYMBICORT 160-4.5 MCG/ACT inhaler INHALE 2 PUFFS BY MOUTH INTO THE LUNGS TWICE A DAY 08/05/18  Yes Reva BoresPratt, Tanya S, MD  VENTOLIN HFA 108 (90 Base) MCG/ACT inhaler INHALE 2 PUFFS BY MOUTH INTO THE LUNGS EVERY 4 HOURS AS NEEDED FOR WHEEZING OR SHORTNESS OF BREATH. 06/25/18  Yes Reva BoresPratt, Tanya S, MD  aspirin-acetaminophen-caffeine (EXCEDRIN MIGRAINE) [id number] MG tablet Take 2 tablets by mouth every 6 (six) hours as needed for headache.    [provider]  eletriptan (RELPAX) 40 MG tablet Take 1 tablet (40 mg total) by mouth  as needed for migraine or headache. May repeat in 2 hours if headache persists or recurs. 09/13/17   Donnamae Jude, MD  famotidine (PEPCID) 20 MG tablet Take 1 tablet (20 mg total) by mouth 2 (two) times daily. 07/24/18   Sela Hilding, MD  fexofenadine [facility]) 180 MG tablet Take 1 tablet (180 mg total) by mouth daily. 05/16/18   Sela Hilding, MD  fluticasone (FLONASE) 50 MCG/ACT nasal spray Place 2 sprays into both nostrils daily for 10 days. 05/16/18 05/26/18  Sela Hilding, MD  fluticasone (FLONASE) 50 MCG/ACT nasal spray Place 2 sprays into both nostrils daily. 10/29/18   Donnamae Jude, MD  ibuprofen (ADVIL,MOTRIN) 200 MG tablet Take 800 mg by mouth every 6 (six) hours as needed for headache.    [provider]  megestrol (MEGACE) 40 MG tablet Take 1 tablet (40 mg total) by mouth 2 (two) times daily. 10/07/18   Donnamae Jude, MD  montelukast (SINGULAIR) 10 MG tablet Take 1 tablet (10 mg total) by mouth at bedtime for 30 days. 05/16/18 10/29/18  Sela Hilding, MD  nystatin ointment (MYCOSTATIN) Apply 1 application topically 2 (two) times daily. 10/07/18   Donnamae Jude, MD    Family History Family History  Problem Relation Age of Onset  . Diabetes type II Father   . Hypertension Father   . Hypertension Mother   . Goiter Sister 35       identical twin  . Asthma Sister   . Hypertension Sister   . Stroke Maternal Grandfather   . Diabetes Paternal Grandfather   . Heart disease Paternal Grandfather     Social History Social History   Tobacco Use  . Smoking status: Never Smoker  . Smokeless tobacco: Never Used  Substance Use Topics  . Alcohol use: No    Alcohol/week: 0.0 standard drinks    Comment: Wine once a year   . Drug use: No     Allergies   Iodine, Augmentin [amoxicillin-pot clavulanate], Lisinopril, and Zofran [ondansetron hcl]   Review of Systems Review of Systems  Neurological: Negative.   All other systems reviewed and are negative.    Physical Exam Triage Vital Signs ED Triage Vitals [11/21/18 1646]  Enc Vitals Group     BP      Pulse      Resp      Temp      Temp src      SpO2      Weight      Height      Head Circumference      Peak Flow      Pain Score 0     Pain Loc      Pain Edu?      Excl. in [place]?    No data found.  Updated Vital Signs BP (!) 144/93 (BP Location: Right Wrist)   Pulse (!) 102   Temp 98.4 F (36.9 C) (Oral)   Resp 16   LMP 10/07/2018 (Exact Date)   SpO2 100%   Physical Exam Vitals signs and nursing note reviewed.  Constitutional:      Appearance: Normal appearance. She is obese.  HENT:     Head: Normocephalic.  Eyes:     Conjunctiva/sclera: Conjunctivae normal.  Neck:     Musculoskeletal: Normal range of motion and neck supple. No neck rigidity.  Cardiovascular:     Pulses: Normal pulses.  Pulmonary:     Effort:  Pulmonary effort is normal.  Musculoskeletal: Normal range of motion.  Skin:    General: Skin is warm and dry.  Neurological:     Mental Status: She is alert and oriented to person, place, and time.     Sensory: Sensory deficit present.  Psychiatric:        Mood and Affect: Mood normal.        Thought Content: Thought content normal.        Judgment: Judgment normal.      UC Treatments / Results  Labs (all labs ordered are listed, but only abnormal results are displayed) Labs Reviewed - No data to display  EKG   Radiology No results found.  Procedures Procedures (including critical care time)  Medications Ordered in UC Medications  dexamethasone (DECADRON) 1 MG/ML solution 10 mg (has no administration in time range)    Initial Impression / Assessment and Plan / UC Course  I have reviewed the triage vital signs and the nursing notes.  Pertinent labs & imaging results that were available during my care of the patient were reviewed by me and considered in my medical decision making (see chart for details).    Final Clinical Impressions(s) / UC Diagnoses   Final diagnoses:  Ulnar neuropathy at wrist, left     Discharge Instructions     The problem is usually very temporary and results from pressure on the ulnar nerve at the wrist  Expect resolution in 2 days or less.    ED Prescriptions    None     Controlled Substance Prescriptions  Controlled Substance Registry consulted? Not Applicable   Elvina SidleLauenstein, Aarion Metzgar, MD 11/21/18 1700

## 2018-11-21 NOTE — ED Triage Notes (Signed)
Pt reports numbness and tingling in her hands bilaterally in her 1st and 2nd fingers to her wrist.  This started this morning when she woke up.  She states she slept in a recliner last night in the hospital, staying with her nephew.

## 2018-11-28 ENCOUNTER — Other Ambulatory Visit: Payer: Self-pay

## 2018-11-28 ENCOUNTER — Ambulatory Visit (INDEPENDENT_AMBULATORY_CARE_PROVIDER_SITE_OTHER): Payer: BC Managed Care – PPO | Admitting: Family Medicine

## 2018-11-28 ENCOUNTER — Encounter: Payer: Self-pay | Admitting: Family Medicine

## 2018-11-28 VITALS — BP 167/94 | HR 97 | Ht 66.0 in | Wt 314.0 lb

## 2018-11-28 DIAGNOSIS — I10 Essential (primary) hypertension: Secondary | ICD-10-CM

## 2018-11-28 DIAGNOSIS — E119 Type 2 diabetes mellitus without complications: Secondary | ICD-10-CM

## 2018-11-28 MED ORDER — METFORMIN HCL 500 MG PO TABS: 500.0000 mg | ORAL_TABLET | Freq: Two times a day (BID) | ORAL | 3 refills | Status: DC

## 2018-11-28 MED ORDER — ACCU-CHEK SMARTVIEW VI STRP: ORAL_STRIP | 12 refills | Status: DC

## 2018-11-28 MED ORDER — ACCU-CHEK FASTCLIX LANCETS MISC: 1.0000 [IU] | Freq: Four times a day (QID) | 12 refills | Status: DC

## 2018-11-28 MED ORDER — AMLODIPINE BESYLATE 10 MG PO TABS: 10.0000 mg | ORAL_TABLET | Freq: Every day | ORAL | 3 refills | Status: DC

## 2018-11-28 MED ORDER — ACCU-CHEK NANO SMARTVIEW W/DEVICE KIT: 1.0000 [IU] | PACK | 0 refills | Status: DC

## 2018-11-28 MED FILL — AMLODIPINE BESYLATE 10 MG T: 10 | 90 days supply | Qty: 90 | Fill #0

## 2018-11-28 MED FILL — metFORMIN HCL 500 MG TABS: 500 | 90 days supply | Qty: 180 | Fill #0

## 2018-11-28 MED FILL — ONETOUCH VERIO TEST STRIP: 25 days supply | Qty: 100 | Fill #0

## 2018-11-28 MED FILL — ONETOUCH VERIO FLEX SYSTEM: W/DEVICE | 1 days supply | Qty: 1 | Fill #0

## 2018-11-28 MED FILL — ONETOUCH DELICA PLUS LANCET: 25 days supply | Qty: 100 | Fill #0

## 2018-11-28 NOTE — Progress Notes (Signed)
   Subjective:    Patient ID: Mary Hanson is a 43 y.o. female presenting with Gynecologic Exam  on 11/28/2018  HPI: Seen at school for physical and found to have abnormal labs. Notes Mary Hanson has felt bad. Mary Hanson has a new diagnosis of DM. Her A1C was 9.9. Notes some increasing thirst and polyuria. Has headache since Friday. Fasting is 148 today. Had blood sugar of 400 on Friday. Mary Hanson is on Norvasc and HCTZ and notes headache, and elevated BP despite taking her meds.  Review of Systems  Constitutional: Negative for chills and fever.  Respiratory: Negative for shortness of breath.   Cardiovascular: Negative for chest pain.  Gastrointestinal: Negative for abdominal pain, nausea and vomiting.  Genitourinary: Negative for dysuria.  Skin: Negative for rash.      Objective:    BP (!) 167/94   Pulse 97   Ht '5\' 6"'$  (1.676 m)   Wt (!) 314 lb (142.4 kg)   BMI 50.68 kg/m  Physical Exam Constitutional:      General: Mary Hanson is not in acute distress.    Appearance: Mary Hanson is well-developed.  HENT:     Head: Normocephalic and atraumatic.  Eyes:     General: No scleral icterus. Neck:     Musculoskeletal: Neck supple.  Cardiovascular:     Rate and Rhythm: Normal rate.  Pulmonary:     Effort: Pulmonary effort is normal.  Abdominal:     Palpations: Abdomen is soft.  Skin:    General: Skin is warm and dry.  Neurological:     Mental Status: Mary Hanson is alert and oriented to person, place, and time.         Assessment & Plan:   Problem List Items Addressed This Visit      Unprioritized   Essential hypertension - Primary    Worsending-Not well controlled-->increase CCB from 5-10 mg daily. Continue HCTZ.      Relevant Medications   amLODipine (NORVASC) 10 MG tablet   Type 2 diabetes mellitus without complication, without long-term current use of insulin (HCC)    New--Meter, strips, lancets ordered. To see D and N mgmt. Discussed diet and exercise, roll of these. Begin Metformin, reviewed common  side effects, usual time until results noted.      Relevant Medications   metFORMIN (GLUCOPHAGE) 500 MG tablet   Blood Glucose Monitoring Suppl (ACCU-CHEK NANO SMARTVIEW) w/Device KIT   glucose blood (ACCU-CHEK SMARTVIEW) test strip   Accu-Chek FastClix Lancets MISC   Other Relevant Orders   Referral to Nutrition and Diabetes Services      Total face-to-face time with patient: 25 minutes. Over 50% of encounter was spent on counseling and coordination of care. Return in 3 months (on 02/28/2019) for Send me CBGs and BPs in 1-2 wks, so we can adjust meds if needed.Donnamae Jude 11/28/2018 2:32 PM

## 2018-11-28 NOTE — Assessment & Plan Note (Addendum)
Worsending-Not well controlled-->increase CCB from 5-10 mg daily. Continue HCTZ.

## 2018-11-28 NOTE — Assessment & Plan Note (Addendum)
New--Meter, strips, lancets ordered. To see D and N mgmt. Discussed diet and exercise, roll of these. Begin Metformin, reviewed common side effects, usual time until results noted.

## 2018-12-11 ENCOUNTER — Other Ambulatory Visit: Payer: Self-pay | Admitting: Family Medicine

## 2018-12-11 DIAGNOSIS — I10 Essential (primary) hypertension: Secondary | ICD-10-CM

## 2018-12-11 MED FILL — MEGESTROL 40 MG TABLET: 40 | 15 days supply | Qty: 30 | Fill #1

## 2018-12-12 MED FILL — HYDROCHLOROTHIAZIDE 25 MG T: 25 | 30 days supply | Qty: 30 | Fill #0

## 2018-12-24 ENCOUNTER — Encounter: Payer: BC Managed Care – PPO | Attending: Family Medicine | Admitting: Skilled Nursing Facility1

## 2018-12-24 ENCOUNTER — Other Ambulatory Visit: Payer: Self-pay

## 2018-12-24 ENCOUNTER — Encounter: Payer: Self-pay | Admitting: Skilled Nursing Facility1

## 2018-12-24 DIAGNOSIS — E669 Obesity, unspecified: Secondary | ICD-10-CM | POA: Insufficient documentation

## 2018-12-24 NOTE — Progress Notes (Signed)
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 Pts A1C 9.9. Pt states her father is an amputee and on dialysis due to DM.  Fasting numbers: 137, 130  To discuss next time: Checking 2 hours after eating Checking feet daily and why How her blood sugars have been  Diabetes Self-Management Education  Visit Type: First/Initial   12/24/2018  Ms. Darrick GrinderGaye Fanning, identified by name and date of birth, is a 43 y.o. female with a diagnosis of Diabetes: Type 2.   ASSESSMENT  Height 5\' 6"  (1.676 m), weight (!) 306 lb (138.8 kg). Body mass index is 49.39 kg/m.  Diabetes Self-Management Education - 12/24/18 0835      Visit Information   Visit Type  First/Initial      Initial Visit   Diabetes Type  Type 2    Are you currently following a meal plan?  No    Are you taking your medications as prescribed?  Yes      Health Coping   How would you rate your overall health?  Fair      Psychosocial Assessment   Patient Belief/Attitude about Diabetes  Afraid    Self-management support  Friends;Family      Pre-Education Assessment   Patient understands the diabetes disease and treatment process.  Needs Instruction    Patient understands incorporating nutritional management into lifestyle.  Needs Instruction    Patient undertands incorporating physical activity into lifestyle.  Needs Instruction    Patient understands using medications safely.  Needs Instruction    Patient understands monitoring blood glucose, interpreting and using results  Needs Instruction    Patient understands prevention, detection, and treatment of acute complications.  Needs Instruction    Patient understands prevention, detection, and treatment of chronic complications.  Needs Instruction    Patient understands how to develop strategies to address psychosocial issues.  Needs Instruction    Patient understands how to develop strategies to promote health/change behavior.  Needs Instruction      Complications   Last HgB A1C per patient/outside source  9.9 %    How often do you check your blood sugar?  1-2 times/day    Fasting Blood glucose range (mg/dL)  [id number]    Number of hypoglycemic episodes per month  0    Number of hyperglycemic episodes per week  0    Have you had a dilated eye exam in the past 12 months?  No    Have you had a dental exam in the past 12 months?  Yes    Are you checking your feet?  Yes    How many days per week are you checking your feet?  7      Dietary Intake   Breakfast  fruit oatmeal and sasuage and egg mcmuffin    Lunch  salad with chicken and salmon with 1/2 cup bluberries and yogurt    Dinner  chicken caluflower rice and broccli with blueberries and yogurt    Beverage(s)  water      Exercise   Exercise Type  ADL's    How many days per week to you exercise?  0    How many minutes per day do you exercise?  0    Total minutes per week of exercise  0      Patient Education   Previous Diabetes Education  No    Disease state   Factors that contribute to the development of diabetes  Acute complications  Taught treatment of hypoglycemia - the 15 rule.      Individualized Goals (developed by patient)   Nutrition  General guidelines for healthy choices and portions discussed;Follow meal plan discussed;Adjust meds/carbs with exercise as discussed    Physical Activity  Exercise 5-7 days per week;30 minutes per day    Medications  take my medication as prescribed    Monitoring   test my blood glucose as discussed      Post-Education Assessment   Patient understands the diabetes disease and treatment process.  Demonstrates understanding / competency    Patient understands incorporating nutritional management into lifestyle.  Demonstrates understanding / competency    Patient undertands incorporating physical activity into lifestyle.  Demonstrates understanding / competency    Patient understands using medications safely.  Demonstrates understanding /  competency    Patient understands monitoring blood glucose, interpreting and using results  Demonstrates understanding / competency    Patient understands prevention, detection, and treatment of acute complications.  Demonstrates understanding / competency    Patient understands prevention, detection, and treatment of chronic complications.  Demonstrates understanding / competency    Patient understands how to develop strategies to address psychosocial issues.  Demonstrates understanding / competency    Patient understands how to develop strategies to promote health/change behavior.  Demonstrates understanding / competency      Outcomes   Expected Outcomes  Demonstrated interest in learning. Expect positive outcomes    Future DMSE  PRN    Program Status  Completed       Individualized Plan for Diabetes Self-Management Training:   Learning Objective:  Patient will have a greater understanding of diabetes self-management. Patient education plan is to attend individual and/or group sessions per assessed needs and concerns.   Plan:   Patient Instructions  -Always bring your meter with you everywhere you go -Always Properly dispose of your needles:  -Discard in a hard plastic/metal container with a lid (something the needle can't puncture)  -Write Do Not Recycle on the outside of the container  -Example: A laundry detergent bottle -Never use the same needle more than once -Eat 2-3 carbohydrate choices for each meal and 1 for each snack -A meal: carbohydrates, protein, vegetable -A snack: A Fruit OR Vegetable AND Protein  -Try to be more active -Always pay attention to your body keeping watchful of possible low blood sugar (below 70) or high blood sugar (above 200)  -Check your feet every day looking for anything that was not there the day before    Expected Outcomes:  Demonstrated interest in learning. Expect positive outcomes  Education material provided: ADA - How to Thrive: A  Guide for Your Journey with Diabetes  If problems or questions, patient to contact team via:  Phone  Future DSME appointment: PRN

## 2018-12-24 NOTE — Patient Instructions (Signed)
-  Always bring your meter with you everywhere you go -Always Properly dispose of your needles:  -Discard in a hard plastic/metal container with a lid (something the needle can't puncture)  -Write Do Not Recycle on the outside of the container  -Example: A laundry detergent bottle -Never use the same needle more than once -Eat 2-3 carbohydrate choices for each meal and 1 for each snack -A meal: carbohydrates, protein, vegetable -A snack: A Fruit OR Vegetable AND Protein  -Try to be more active -Always pay attention to your body keeping watchful of possible low blood sugar (below 70) or high blood sugar (above 200)  -Check your feet every day looking for anything that was not there the day before  

## 2019-01-14 ENCOUNTER — Ambulatory Visit: Payer: BC Managed Care – PPO | Admitting: Family Medicine

## 2019-01-14 ENCOUNTER — Encounter: Payer: Self-pay | Admitting: Family Medicine

## 2019-01-14 ENCOUNTER — Other Ambulatory Visit: Payer: Self-pay

## 2019-01-14 VITALS — BP 110/80 | HR 99 | Ht 66.0 in | Wt 302.0 lb

## 2019-01-14 DIAGNOSIS — J4540 Moderate persistent asthma, uncomplicated: Secondary | ICD-10-CM | POA: Diagnosis not present

## 2019-01-14 DIAGNOSIS — H538 Other visual disturbances: Secondary | ICD-10-CM | POA: Diagnosis not present

## 2019-01-14 DIAGNOSIS — G43009 Migraine without aura, not intractable, without status migrainosus: Secondary | ICD-10-CM

## 2019-01-14 MED ORDER — FEXOFENADINE HCL 180 MG PO TABS: 180.0000 mg | ORAL_TABLET | Freq: Every day | ORAL | 2 refills | Status: AC

## 2019-01-14 NOTE — Patient Instructions (Addendum)
It was great to see you!  Our plans for today:  - Try taking tylenol and ibuprofen together for your pain. If this does not work, you can take the relpax. Relpax works best if you take it at the beginning of your pain.  - You may also find a heating pad on your neck to be of benefit. - Take the allegra for the next few days. I recommend taking flonase every day to help control your symptoms. - We are referring you to the eye doctor. - If you develop worsening vision or loss of vision or if your pain doesn't get better, come back to see [place].  Take care and seek immediate care sooner if you develop any concerns.   Dr. [facility]

## 2019-01-14 NOTE — Assessment & Plan Note (Signed)
Few day h/o dull ache in frontal region associated with nausea, photophobia and phonophobia with transient blurry vision consistent with prior migraines. Additionally had itchy throat and watery eyes so likely allergic component as well. Normal neurologic exam, low suspicion for tumor, stroke. Afebrile and only few day history of pain, unlikely bacterial sinusitis. Visual acuity 20/40 in each eye with 20/30 bilaterally, worsened from previous per patient, will refer to ophthalmology for dilated eye exam, fundoscopic exam here without abnormalities. Will treat symptomatically with tylenol/ibuprofen and relpax if not getting better given patient doesn't like the way it makes her feel. No current indication for prophylactic medication. Return precautions given, see AVS for details.

## 2019-01-14 NOTE — Progress Notes (Signed)
Subjective:   Patient ID: Mary Hanson    DOB: 1976/02/18, 43 y.o. female   MRN: [id number]  Mary Hanson is a 43 y.o. female with a history of HTN, external hemorrhoids, migraine, allergic rhinitis, asthma, T2DM, morbid obesity, menorrhagia, vitamin D deficiency here for   HEADACHE  Headache started few days ago. Woke up this morning with lingering pain. Pain is dull ache, not throbbing. Keeps from doing:  Yes today, missed work today. Location: frontal and facial Medications tried: tylenol, excedrin Patient thinks cause of headache might be: allergies vs migraine Throat itchy yesterday, eyes watering. Blood sugars have been better. Bps have been low normal, around 100s systolic. Has h/o migraines, on relpax prn but doesn't like to take because it makes her feel weird. She hardly ever gets migraines and thinks menses may be a trigger. Never been on prophylactic medication.   Head trauma: no Sudden onset: no Previous similar headaches: yes, h/o migraines Taking blood thinners: no History of cancer: no  Symptoms Nose congestion stuffiness:  no Nausea or vomiting: nauseous, but no vomiting (thinks it may be due to starting period) Photophobia: yes Noise sensitivity: yes Double vision or loss of vision: blurry vision this morning with computer screen. Fever: no Neck Stiffness: no Trouble walking or speaking: no  Review of Systems:  Per HPI.  Medications and smoking status reviewed.  Objective:   BP 110/80   Pulse 99   Ht 5\' 6"  (1.676 m)   Wt (!) 302 lb (137 kg)   LMP 08/28/2018   SpO2 96%   BMI 48.74 kg/m  Vitals and nursing note reviewed.  General: obese female, in no acute distress with non-toxic appearance CV: regular rate and rhythm without murmurs, rubs, or gallops Lungs: clear to auscultation bilaterally with normal work of breathing MSK: Full ROM, strength 5/5 to U/LE bilaterally, normal gait.  No edema.  Neuro: Alert and oriented, speech normal.  Optic  field normal. PERRL, Extraocular movements intact.  Fundoscopic exam without notable abnormalities. Intact symmetric sensation to light touch of face and extremities bilaterally.  Hearing grossly intact bilaterally.  Tongue protrudes normally with no deviation.  Shoulder shrug, smile symmetric. Finger to nose normal.  Assessment & Plan:   Migraine headache Few day h/o dull ache in frontal region associated with nausea, photophobia and phonophobia with transient blurry vision consistent with prior migraines. Additionally had itchy throat and watery eyes so likely allergic component as well. Normal neurologic exam, low suspicion for tumor, stroke. Afebrile and only few day history of pain, unlikely bacterial sinusitis. Visual acuity 20/40 in each eye with 20/30 bilaterally, worsened from previous per patient, will refer to ophthalmology for dilated eye exam, fundoscopic exam here without abnormalities. Will treat symptomatically with tylenol/ibuprofen and relpax if not getting better given patient doesn't like the way it makes her feel. No current indication for prophylactic medication. Return precautions given, see AVS for details.  Orders Placed This Encounter  Procedures  . Ambulatory referral to Ophthalmology    Referral Priority:   Routine    Referral Type:   Consultation    Referral Reason:   Specialty Services Required    Requested Specialty:   Ophthalmology    Number of Visits Requested:   1   Meds ordered this encounter  Medications  . fexofenadine (ALLEGRA ALLERGY) 180 MG tablet    Sig: Take 1 tablet (180 mg total) by mouth daily.    Dispense:  90 tablet    Refill:  2  Rory Percy, DO PGY-3, [facility] 01/14/2019 3:26 PM

## 2019-01-15 ENCOUNTER — Other Ambulatory Visit: Payer: Self-pay | Admitting: Family Medicine

## 2019-01-15 DIAGNOSIS — G43109 Migraine with aura, not intractable, without status migrainosus: Secondary | ICD-10-CM

## 2019-01-15 MED FILL — FEXOFENADINE HCL 180 MG TAB: 180 | 30 days supply | Qty: 30 | Fill #0

## 2019-01-15 MED FILL — ELETRIPTAN HBR 40 MG TABLET: 40 | 5 days supply | Qty: 10 | Fill #0

## 2019-01-15 MED FILL — MEGESTROL 40 MG TABLET: 40 | 15 days supply | Qty: 30 | Fill #2

## 2019-01-15 MED FILL — HYDROCHLOROTHIAZIDE 25 MG T: 25 | 30 days supply | Qty: 30 | Fill #1

## 2019-01-23 ENCOUNTER — Other Ambulatory Visit: Payer: Self-pay

## 2019-01-23 ENCOUNTER — Encounter: Payer: BC Managed Care – PPO | Attending: Family Medicine | Admitting: Registered"

## 2019-01-23 ENCOUNTER — Encounter: Payer: Self-pay | Admitting: Registered"

## 2019-01-23 DIAGNOSIS — E669 Obesity, unspecified: Secondary | ICD-10-CM | POA: Insufficient documentation

## 2019-01-23 DIAGNOSIS — E119 Type 2 diabetes mellitus without complications: Secondary | ICD-10-CM

## 2019-01-23 NOTE — Progress Notes (Signed)
Diabetes Self-Management Education  Visit Type:     01/23/2019  [name], identified by name and date of birth, is a 43 y.o. female with a diagnosis of Diabetes:  .   ASSESSMENT  Weight 296 lb 14.4 oz (134.7 kg). Body mass index is 47.92 kg/m.   Pt alone for appointment. Pt reports blood sugar has been really good. Fasting has been between 110-132. Reports she didn't feel very good this week d/t PMS and blood sugar have been 120-130. Reports no more high blood sugars. Pt is not currently checking after meals, only fasting. Pt denies having any low blood sugars.   SMBG: Fasting 110-132 Not checking after meals.  Pt reports she does not have an appetite at night when she gets home from work. Reports she never had this problem with appetite before. Reports it is difficult to eat dinner which makes it hard to take Metformin. Will sometimes just have some fruit. Pt gets home around 8-9 PM. Usually eats lunch around 2-3 PM. Reports it has been crazy at her work at [employer] with COVID testing. Reports change in appetite started a couple weeks ago. 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 Pt feels she often really tired when she gets home and wants to go to sleep rather than eat.   Pt wants to know if she should include a snack while at work because her breakfast and lunch are usually 6-6.5 hours apart. Pt also has questions regarding whether her breakfast foods are ok. When at home often has egg whites, 1/4 cup grits (~7.5 g CHO), 1 piece of sausage, 1 piece whole wheat toast and tbsp jelly (30 g CHO) OR [place] and egg mcmuffin (33 g CHO), unsweet tea, fries (44 g CHO).   Pt reports she has not been able to get in physical activity apart from the walking she gets in at work:10000 steps usually.   24 Hour Recall  Breakfast: [place]: [place], egg, english muffin, fries, unsweet tea  Lunch: 1  piece grilled chicken, green beans, half serving mac and cheese, unsweet tea Dinner: [employer] Friday buffalo wings (6), spinach artichoke dip, sparkling water, coconut milk ice cream sandwich with 8 g CHO  Individualized Plan for Diabetes Self-Management Training:   Learning Objective:  Patient will have a greater understanding of diabetes self-management. Patient education plan is to attend individual and/or group sessions per assessed needs and concerns.   Plan: -Dietitian discussed how stress and lack of sleep can affect appetite. Discussed if appetite is low to at least include a balanced snack or small portion of balanced meal. -Discussed if meals are spaced more than 5 hours apart do recommend having a balanced snack. Provided handout. Discussed keeping these foods at work in case meal is delayed.  -Discussed typical breakfast meals and how to make changes to keep within 2-3 carbohydrate choices per meal.  -Recommended pt check blood sugar 1-2 hours after a meal as well as fasting each morning. Discussed blood sugar goals.  -Discussed checking feet-pt is already doing so daily.   Instructions/Goals:   -Continue working to have 3 meals/eating every 3-5 hours and continue working to include 2-3 carb choices  Have a balanced snack if meals are more than 5 hours apart   If unable to eat a whole meal at dinner, at least have a balanced snack (see handout)  -Try to get in between 7-9 hours of  sleep per night   -Continue checking blood sugar in the morning before eating or drinking and also 1 time 1-2 hours after a meal.   Fasting Goals: 80-130  1-2 hours after meal goals: less than 180  Make physical activity a part of your week. Try to include at least 30 minutes of physical activity 5 days each week or at least 150 minutes per week. Regular physical activity promotes overall health-including helping to reduce risk for heart disease and diabetes, promoting mental health, and helping [place]  sleep better.    Patient Instructions  Instructions/Goals:   -Continue working to have 3 meals/eating every 3-5 hours and continue working to include 2-3 carb choices  Have a balanced snack if meals are more than 5 hours apart   If unable to eat a whole meal at dinner, at least have a balanced snack (see handout)  -Try to get in between 7-9 hours of sleep per night   -Continue checking blood sugar in the morning before eating or drinking and also 1 time 1-2 hours after a meal.   Fasting Goals: 80-130  1-2 hours after meal goals: less than 180  Make physical activity a part of your week. Try to include at least 30 minutes of physical activity 5 days each week or at least 150 minutes per week. Regular physical activity promotes overall health-including helping to reduce risk for heart disease and diabetes, promoting mental health, and helping [place] sleep better.      Expected Outcomes:   positive   Education material provided: Snack sheet  If problems or questions, patient to contact team via:  Phone, email   Future DSME appointment:  1 month

## 2019-01-23 NOTE — Patient Instructions (Addendum)
Instructions/Goals:   -Continue working to have 3 meals/eating every 3-5 hours and continue working to include 2-3 carb choices  Have a balanced snack if meals are more than 5 hours apart   If unable to eat a whole meal at dinner, at least have a balanced snack (see handout)  -Try to get in between 7-9 hours of sleep per night   -Continue checking blood sugar in the morning before eating or drinking and also 1 time 1-2 hours after a meal.   Fasting Goals: 80-130  1-2 hours after meal goals: less than 180  Make physical activity a part of your week. Try to include at least 30 minutes of physical activity 5 days each week or at least 150 minutes per week. Regular physical activity promotes overall health-including helping to reduce risk for heart disease and diabetes, promoting mental health, and helping [place] sleep better.

## 2019-02-17 ENCOUNTER — Other Ambulatory Visit: Payer: Self-pay | Admitting: Family Medicine

## 2019-02-17 DIAGNOSIS — I10 Essential (primary) hypertension: Secondary | ICD-10-CM

## 2019-02-17 MED FILL — ONETOUCH VERIO TEST STRIP: 25 days supply | Qty: 100 | Fill #1

## 2019-02-17 MED FILL — ONETOUCH DELICA PLUS LANCET: 25 days supply | Qty: 100 | Fill #1

## 2019-02-17 MED FILL — HYDROCHLOROTHIAZIDE 25 MG T: 25 | 30 days supply | Qty: 30 | Fill #0

## 2019-02-21 ENCOUNTER — Telehealth: Payer: Self-pay | Admitting: Family Medicine

## 2019-02-21 NOTE — Telephone Encounter (Signed)
Patient is coming in on Monday for appt regarding diabetes management and just wants to ask if she needs to fast for any bloodwork she may have, thanks.

## 2019-02-21 NOTE — Telephone Encounter (Signed)
Spoke with patient and informed her that she is due for an annual cholesterol check and should plan to fast for that. Mary Hanson,CMA

## 2019-02-24 ENCOUNTER — Other Ambulatory Visit: Payer: Self-pay

## 2019-02-24 ENCOUNTER — Encounter: Payer: Self-pay | Admitting: Family Medicine

## 2019-02-24 ENCOUNTER — Ambulatory Visit (INDEPENDENT_AMBULATORY_CARE_PROVIDER_SITE_OTHER): Payer: BC Managed Care – PPO | Admitting: Family Medicine

## 2019-02-24 VITALS — BP 128/78 | HR 88 | Wt 291.0 lb

## 2019-02-24 DIAGNOSIS — Z23 Encounter for immunization: Secondary | ICD-10-CM

## 2019-02-24 DIAGNOSIS — I10 Essential (primary) hypertension: Secondary | ICD-10-CM

## 2019-02-24 DIAGNOSIS — G43109 Migraine with aura, not intractable, without status migrainosus: Secondary | ICD-10-CM | POA: Diagnosis not present

## 2019-02-24 DIAGNOSIS — K219 Gastro-esophageal reflux disease without esophagitis: Secondary | ICD-10-CM

## 2019-02-24 DIAGNOSIS — E119 Type 2 diabetes mellitus without complications: Secondary | ICD-10-CM

## 2019-02-24 LAB — POCT GLYCOSYLATED HEMOGLOBIN (HGB A1C): HbA1c, POC (controlled diabetic range): 6.4 % (ref 0.0–7.0)

## 2019-02-24 MED ORDER — FAMOTIDINE 20 MG PO TABS: 20.0000 mg | ORAL_TABLET | Freq: Two times a day (BID) | ORAL | 0 refills | Status: DC

## 2019-02-24 MED ORDER — ELETRIPTAN HYDROBROMIDE 40 MG PO TABS: ORAL_TABLET | ORAL | 3 refills | Status: DC

## 2019-02-24 MED ORDER — HYDROCHLOROTHIAZIDE 25 MG PO TABS: 25.0000 mg | ORAL_TABLET | Freq: Every day | ORAL | 6 refills | Status: DC

## 2019-02-24 MED FILL — FAMOTIDINE 20 MG TABS: 20 | 30 days supply | Qty: 60 | Fill #0

## 2019-02-24 MED FILL — ELETRIPTAN HBR 40 MG TABLET: 40 | 30 days supply | Qty: 10 | Fill #0

## 2019-02-24 NOTE — Patient Instructions (Signed)

## 2019-02-24 NOTE — Progress Notes (Signed)
21 

## 2019-02-24 NOTE — Progress Notes (Signed)
    Subjective:    Patient ID: Mary Hanson is a 43 y.o. female presenting with Diabetes  on 02/24/2019  HPI: Her Blood sugar dropped 30 points and felt it at 94. Since that time she has not felt well. Having headache and just feeling bad. She is swabbing people for COVID. Has been testing negative q 2-3 days. Has h/o headaches, and this may be similar or sinuses. Has not tried her Relpax. Wonders if the metformin making her nauseous. Has lost 30 lbs since diagnosis. Using Metformin bid.  Review of Systems  Constitutional: Negative for chills and fever.  Respiratory: Negative for shortness of breath.   Cardiovascular: Negative for chest pain.  Gastrointestinal: Negative for abdominal pain, nausea and vomiting.  Genitourinary: Negative for dysuria.  Skin: Negative for rash.      Objective:    BP 128/78   Pulse 88   Wt 291 lb (132 kg)   LMP 01/24/2019 (Approximate)   SpO2 98%   BMI 46.97 kg/m  Physical Exam Constitutional:      General: She is not in acute distress.    Appearance: She is well-developed.  HENT:     Head: Normocephalic and atraumatic.  Eyes:     General: No scleral icterus. Neck:     Musculoskeletal: Neck supple.  Cardiovascular:     Rate and Rhythm: Normal rate.  Pulmonary:     Effort: Pulmonary effort is normal.  Abdominal:     Palpations: Abdomen is soft.  Skin:    General: Skin is warm and dry.  Neurological:     Mental Status: She is alert and oriented to person, place, and time.    HbA1c, POC (controlled diabetic range) 6.4         Assessment & Plan:   Problem List Items Addressed This Visit      Unprioritized   Essential hypertension    Doing well, stable on Norvasc 10 mg      Relevant Medications   hydrochlorothiazide (HYDRODIURIL) 25 MG tablet   Migraine headache    Relpax today--written out of work for next day to help get rid of this.      Relevant Medications   hydrochlorothiazide (HYDRODIURIL) 25 MG tablet   eletriptan  (RELPAX) 40 MG tablet   Type 2 diabetes mellitus without complication, without long-term current use of insulin ([place]) - Primary    Much improved and weight loss--encouraged to keep up the good work. Mild nausea--will decrease Metformin to daily dosing.      Relevant Orders   HgB A1c (Completed)   Glucose (CBG)   Urine Microalbumin w/creat. ratio   Pneumococcal polysaccharide vaccine 23-valent greater than or equal to 2yo subcutaneous/IM (Completed)    Other Visit Diagnoses    Gastroesophageal reflux disease without esophagitis       Relevant Medications   famotidine (PEPCID) 20 MG tablet      Total face-to-face time with patient: 25 minutes. Over 50% of encounter was spent on counseling and coordination of care. Return in about 3 months (around 05/27/2019).  Mary Hanson 02/25/2019 12:10 PM

## 2019-02-25 ENCOUNTER — Encounter: Payer: Self-pay | Admitting: Family Medicine

## 2019-02-25 NOTE — Assessment & Plan Note (Signed)
Doing well, stable on Norvasc 10 mg

## 2019-02-25 NOTE — Assessment & Plan Note (Signed)
Much improved and weight loss--encouraged to keep up the good work. Mild nausea--will decrease Metformin to daily dosing.

## 2019-02-25 NOTE — Assessment & Plan Note (Signed)
Relpax today--written out of work for next day to help get rid of this.

## 2019-02-26 LAB — MICROALBUMIN / CREATININE URINE RATIO
Creatinine, Urine: 238.3 mg/dL
Microalb/Creat Ratio: 4 mg/g creat (ref 0–29)
Microalbumin, Urine: 9.1 ug/mL

## 2019-03-05 ENCOUNTER — Encounter: Payer: BC Managed Care – PPO | Attending: Family Medicine | Admitting: Registered"

## 2019-03-05 ENCOUNTER — Encounter: Payer: Self-pay | Admitting: Registered"

## 2019-03-05 ENCOUNTER — Other Ambulatory Visit: Payer: Self-pay

## 2019-03-05 DIAGNOSIS — E669 Obesity, unspecified: Secondary | ICD-10-CM | POA: Insufficient documentation

## 2019-03-05 DIAGNOSIS — E119 Type 2 diabetes mellitus without complications: Secondary | ICD-10-CM

## 2019-03-05 NOTE — Patient Instructions (Signed)
Instructions/Goals:   Doing a great job! Way to go! :D   -Continue working to have 3 meals/eating every 3-5 hours and continue working to include 2-3 carb choices  Have a balanced snack if meals are more than 5 hours apart   If working night shifts: eating breakfast within about 1 hour of waking and then have meals about 4-5 hours apart. Avoid eating within 3 hours of lying down.   If unable to eat a whole meal at dinner, at least have a balanced snack (see handout)  -Continue trying to get in between 7-9 hours of sleep per night   -Continue checking blood sugar in the morning before eating or drinking and also 1 time 1-2 hours after a meal.   Fasting Goals: 80-130  1-2 hours after meal goals: less than 180  Make physical activity a part of your week. Try to include at least 30 minutes of physical activity 5 days each week or at least 150 minutes per week. Regular physical activity promotes overall health-including helping to reduce risk for heart disease and diabetes, promoting mental health, and helping [place] sleep better.    May add in 30 minutes of physical activity 1-2 days per week outside of activity at work. Try to find activities you enjoy doing-may incorporate music, videos etc to make it more enjoyable

## 2019-03-05 NOTE — Progress Notes (Signed)
Diabetes Self-Management Education  Visit Type:  [id number]  03/05/2019  [name], identified by name and date of birth, is a 43 y.o. female with a diagnosis of Diabetes:  .   ASSESSMENT  Weight 287 lb 3.2 oz (130.3 kg). Body mass index is 46.36 kg/m.   Pt alone for appointment. Pt reports her Meformin was reduced d/t improvement in blood sugar and concerns about nausea. Reports she has been having migraines and is supposed to see an eye doctor today to assess as she has been having visual effect with migraines. Reports blood sugars have been 92-98 typically. Reports today was 116 but overall that has been the highest number for fasting. Reports a couple weeks ago her blood sugar dropped 30 points quickly to 94 and felt lightheaded. Reports she feels she had not eaten enough carbohydrates and was being very active at work. Denies any low numbers or symptoms of lows otherwise. Reports never feeling that way before. Reports she then had lunch and felt better. Pt reports she is now checking blood sugar 2 hours after some meals as well and reports measures between 150-160. Reports work stress is getting better and sleep has improved. Reports being able to rest at night now.   Pt reports sometimes she works night shifts and wants to know how to eat meals when working night shift. 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   SMBG: Fasting 92-116 Postprandial: 150s-160s   24 Hour Recall  Breakfast: [employer] sausage and egg biscuit, small bo'rounds ate half, unsweet tea, Lunch: [place] sandwich on wheat bread, handful quinoa chips, cup of light [place] yogurt, few blueberries, sparkling water  Dinner: cup of popcorn, sparkling water, low carb coconut ice cream sandwich    Physical Activity: reports getting in a lot of walking at work. Estimates 15000 steps daily. Reports she wants to start at the gym but doesn't yet feel comfortable.  Work Schedule: Works Mon-Friday,  sometimes weekends. Typically works 8-9 hours per day.    Individualized Plan for Diabetes Self-Management Training:   Learning Objective:  Patient will have a greater understanding of diabetes self-management. Patient education plan is to attend individual and/or group sessions per assessed needs and concerns.   Plan: -Dietitian praised pt's progress with working to eat at regular intervals, checking blood sugar regularly, working to get in adequate sleep, and working on having more balance with foods. Discussed adding in more vegetables which pt reports she has also been doing, continued with previously discussed goals, and if low appetite, having at least a protein and carbohydrate for balance. Discussed eating plan for working nights-staying with same consistent eating pattern of breakfast within 1 hour of waking and other meals 4-5 hours apart. Discussed that pt could incorporate some physical activity on off days at home until ready to go back to the gym. Pt appeared agreeable to information/goals discussed.   Instructions/Goals:   Doing a great job! Way to go! :D   -Continue working to have 3 meals/eating every 3-5 hours and continue working to include 2-3 carb choices  Have a balanced snack if meals are more than 5 hours apart   If working night shifts: eating breakfast within about 1 hour of waking and then have meals about 4-5 hours apart. Avoid eating within 3 hours of lying down.   If unable to eat a whole meal at dinner, at least have a balanced snack (see handout)  -Continue trying to get in between 7-9  hours of sleep per night   -Continue checking blood sugar in the morning before eating or drinking and also 1 time 1-2 hours after a meal.   Fasting Goals: 80-130  1-2 hours after meal goals: less than 180  Make physical activity a part of your week. Try to include at least 30 minutes of physical activity 5 days each week or at least 150 minutes per week. Regular physical  activity promotes overall health-including helping to reduce risk for heart disease and diabetes, promoting mental health, and helping [place] sleep better.    May add in 30 minutes of physical activity 1-2 days per week outside of activity at work. Try to find activities you enjoy doing-may incorporate music, videos etc to make it more enjoyable   Patient Instructions  Instructions/Goals:   Doing a great job! Way to go! :D   -Continue working to have 3 meals/eating every 3-5 hours and continue working to include 2-3 carb choices  Have a balanced snack if meals are more than 5 hours apart   If working night shifts: eating breakfast within about 1 hour of waking and then have meals about 4-5 hours apart. Avoid eating within 3 hours of lying down.   If unable to eat a whole meal at dinner, at least have a balanced snack (see handout)  -Continue trying to get in between 7-9 hours of sleep per night   -Continue checking blood sugar in the morning before eating or drinking and also 1 time 1-2 hours after a meal.   Fasting Goals: 80-130  1-2 hours after meal goals: less than 180  Make physical activity a part of your week. Try to include at least 30 minutes of physical activity 5 days each week or at least 150 minutes per week. Regular physical activity promotes overall health-including helping to reduce risk for heart disease and diabetes, promoting mental health, and helping [place] sleep better.    May add in 30 minutes of physical activity 1-2 days per week outside of activity at work. Try to find activities you enjoy doing-may incorporate music, videos etc to make it more enjoyable   Expected Outcomes:   positive   If problems or questions, patient to contact team via:  Phone, email   Future DSME appointment:  2 months

## 2019-03-10 ENCOUNTER — Ambulatory Visit: Payer: BC Managed Care – PPO | Admitting: Family Medicine

## 2019-03-17 ENCOUNTER — Other Ambulatory Visit: Payer: Self-pay | Admitting: *Deleted

## 2019-03-17 DIAGNOSIS — I10 Essential (primary) hypertension: Secondary | ICD-10-CM

## 2019-03-17 MED ORDER — HYDROCHLOROTHIAZIDE 25 MG PO TABS: 25.0000 mg | ORAL_TABLET | Freq: Every day | ORAL | 6 refills | Status: DC

## 2019-03-17 MED FILL — HYDROCHLOROTHIAZIDE 25 MG T: 25 | 30 days supply | Qty: 30 | Fill #0

## 2019-03-17 MED FILL — AMLODIPINE BESYLATE 10 MG T: 10 | 90 days supply | Qty: 90 | Fill #1

## 2019-06-03 MED FILL — ONETOUCH DELICA PLUS LANCET: 25 days supply | Qty: 100 | Fill #2

## 2019-06-03 MED FILL — HYDROCHLOROTHIAZIDE 25 MG T: 25 | 30 days supply | Qty: 30 | Fill #2

## 2019-06-03 MED FILL — ONETOUCH VERIO TEST STRIP: 25 days supply | Qty: 100 | Fill #2

## 2019-07-03 MED FILL — AMLODIPINE BESYLATE 10 MG T: 10 | 90 days supply | Qty: 90 | Fill #2

## 2019-07-03 MED FILL — HYDROCHLOROTHIAZIDE 25 MG T: 25 | 30 days supply | Qty: 30 | Fill #3

## 2019-07-21 ENCOUNTER — Other Ambulatory Visit (HOSPITAL_COMMUNITY): Payer: Self-pay | Admitting: Internal Medicine

## 2019-07-21 MED FILL — predniSONE 20 MG TABS: 20 | 5 days supply | Qty: 10 | Fill #0

## 2019-07-21 MED FILL — SYMBICORT 160-4.5 MCG INH: 160-4.5 | 30 days supply | Qty: 10 | Fill #0

## 2019-07-23 MED FILL — MEGESTROL 40 MG TABLET: 40 | 15 days supply | Qty: 30 | Fill #3

## 2019-07-23 MED FILL — ALBUTEROL SULFATE HFA 108 (: 108 (90 BAS | 25 days supply | Qty: 18 | Fill #0

## 2019-09-09 MED FILL — HYDROCHLOROTHIAZIDE 25 MG T: 25 | 30 days supply | Qty: 30 | Fill #4

## 2019-09-15 MED FILL — MONTELUKAST SOD 10 MG TAB: 10 | 30 days supply | Qty: 30 | Fill #0

## 2019-09-15 MED FILL — FLUTICASONE PROP 50 MCG SPR: 50 | 30 days supply | Qty: 16 | Fill #0

## 2019-10-08 ENCOUNTER — Encounter: Payer: Self-pay | Admitting: Radiology

## 2019-10-22 MED FILL — NAPROXEN 500 MG TABS: 500 | 7 days supply | Qty: 14 | Fill #0

## 2019-10-22 MED FILL — POTASSIUM CHLORIDE CRYS ER: 20 | 6 days supply | Qty: 12 | Fill #0

## 2019-11-07 MED FILL — ONETOUCH DELICA PLUS LANCET: 25 days supply | Qty: 100 | Fill #3

## 2019-11-07 MED FILL — AMLODIPINE BESYLATE 10 MG T: 10 | 90 days supply | Qty: 90 | Fill #3

## 2020-01-05 ENCOUNTER — Encounter: Payer: Self-pay | Admitting: Radiology

## 2020-02-02 MED FILL — HYDROCHLOROTHIAZIDE 25 MG T: 25 | 30 days supply | Qty: 30 | Fill #5

## 2020-03-05 ENCOUNTER — Other Ambulatory Visit: Payer: Self-pay | Admitting: Family Medicine

## 2020-03-05 DIAGNOSIS — I10 Essential (primary) hypertension: Secondary | ICD-10-CM

## 2020-03-15 ENCOUNTER — Other Ambulatory Visit (HOSPITAL_COMMUNITY): Payer: Self-pay | Admitting: Internal Medicine

## 2020-03-15 MED FILL — AMLODIPINE BESYLATE 10 MG T: 10 | 30 days supply | Qty: 30 | Fill #0

## 2020-03-24 MED FILL — AMLODIPINE BESYLATE 10 MG T: 10 | 30 days supply | Qty: 30 | Fill #0

## 2020-05-06 MED FILL — AMLODIPINE BESYLATE 10 MG T: 10 | 30 days supply | Qty: 30 | Fill #1

## 2020-05-26 MED FILL — ALBUTEROL SULFATE HFA 108 (: 108 (90 BAS | 25 days supply | Qty: 18 | Fill #1

## 2020-05-27 ENCOUNTER — Other Ambulatory Visit (HOSPITAL_COMMUNITY): Payer: Self-pay | Admitting: Foot & Ankle Surgery

## 2020-05-27 DIAGNOSIS — M7661 Achilles tendinitis, right leg: Secondary | ICD-10-CM | POA: Diagnosis not present

## 2020-05-27 DIAGNOSIS — M7752 Other enthesopathy of left foot: Secondary | ICD-10-CM | POA: Diagnosis not present

## 2020-05-27 DIAGNOSIS — M7662 Achilles tendinitis, left leg: Secondary | ICD-10-CM | POA: Diagnosis not present

## 2020-05-27 MED FILL — MELOXICAM 15 MG TABLET: 15 | 40 days supply | Qty: 40 | Fill #0

## 2020-05-28 ENCOUNTER — Other Ambulatory Visit: Payer: Self-pay

## 2020-05-28 ENCOUNTER — Other Ambulatory Visit: Payer: Self-pay | Admitting: Family Medicine

## 2020-05-28 ENCOUNTER — Ambulatory Visit (INDEPENDENT_AMBULATORY_CARE_PROVIDER_SITE_OTHER): Payer: 59 | Admitting: Family Medicine

## 2020-05-28 VITALS — BP 136/82 | HR 97 | Ht 66.0 in | Wt 304.4 lb

## 2020-05-28 DIAGNOSIS — T7840XA Allergy, unspecified, initial encounter: Secondary | ICD-10-CM | POA: Diagnosis not present

## 2020-05-28 DIAGNOSIS — J4540 Moderate persistent asthma, uncomplicated: Secondary | ICD-10-CM | POA: Diagnosis not present

## 2020-05-28 MED ORDER — FLUTICASONE-SALMETEROL 250-50 MCG/DOSE IN AEPB: 1.0000 | INHALATION_SPRAY | Freq: Two times a day (BID) | RESPIRATORY_TRACT | 3 refills | Status: DC

## 2020-05-28 MED ORDER — EPINEPHRINE 0.3 MG/0.3ML IJ SOAJ: 0.5000 mg | INTRAMUSCULAR | 1 refills | Status: DC | PRN

## 2020-05-28 MED FILL — EPINEPHRINE 0.3 MG AUTO-INJ: 0.3 | 30 days supply | Qty: 2 | Fill #0

## 2020-05-28 MED FILL — ADVAIR 250/50 DISKUS: 250-50 | 30 days supply | Qty: 60 | Fill #0

## 2020-05-28 NOTE — Patient Instructions (Signed)
Thank you for coming to see me today. It was a pleasure. Today we talked about:   I have sent Advair to the pharmacy for you.  Continue to use albuterol.  You can complete the steroid course that they have given you.  While you are taking this, you should avoid NSAIDs.  You can take Tylenol for your pain and get in touch with your foot doctor for further treatment of your Achilles tendinitis.  I have sent you in an EpiPen in case you have an allergic reaction with difficulty breathing.  Please follow-up with PCP within 1 month or sooner if needed.  If you have any questions or concerns, please do not hesitate to call the office at [phone].  Best,   Luis Abed, DO

## 2020-05-28 NOTE — Assessment & Plan Note (Addendum)
Given patient's symptoms occurring after taking Belviq, will go ahead and place this on her allergy list.  Could consider that the tightness in her chest was related to worsening of asthma that she has been dealing with for a few days especially in the setting of increased anxiety with a possible allergic reaction, however given she had 2 systems involved, especially since she had some tightness in her chest, will go ahead and send her home with an EpiPen to use as needed for anaphylaxis.  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  It is very unlikely that this was cardiac in origin given resolution of pain and history of the episode.  She also has no prior cardiac history.  Mobic was placed on allergy list, but not listed as a specific anaphylactic reaction given that this is very unclear.  Did list it is a high allergy and would avoid Mobic.  For now, she can continue on the methyl Pred that was prescribed by the ED doctor as this could help with her pain and wheezing from asthma, see below.  Advised her to follow-up with her podiatrist for her Achilles tendinitis pain and to decide treatment.  She has previously been able to tolerate NSAIDs, therefore could consider trialing a different NSAID but would recommend watching this closely.

## 2020-05-28 NOTE — Progress Notes (Signed)
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 Given mobic 15mg  QD to use because of severe pain Has taken before, but not at this dose 2-3 hrs later, she was at church and someone noticed swelling of her arm She had redness of both hands, R>L arm swelling and she saw redness over this No fever After this, started to have some tightness in her chest, but could breathe Legs were swollen last night too  She did an EKG on her watch and it said sinus She called [facility] ED and was told to take benadryl right away and prescribed methylpred 60 mg taper Didn't take last night, but took this AM Feeling better today No rashes, difficulty breathing Chest tightness subsiding, notices improvement after steroid Has a history of angioedema with lisinopril, only true allergic reaction Takes ibuprofen without issues previously  Has a history of asthma and has been wheezing a few days  Hasn't used albuterol yet today Had a col 2 weeks ago, was COVID negative, gets tested qwk with job No fevers or congestion, still has slight cough Couldn't afford symbicort and was told to ask for advair by pharmacy Never had allergic reaction like this before   PERTINENT  PMH / PSH: Hypertension, morbid obesity, allergic rhinitis, asthma, T2DM  OBJECTIVE:   BP 136/82   Pulse 97   Ht 5\' 6"  (1.676 m)   Wt (!) 304 lb 6.4 oz (138.1 kg)   SpO2 95%   BMI 49.13 kg/m    Physical Exam:  General: 45 y.o. female in NAD Cardio: RRR no m/r/g Lungs: Good air movement throughout all lung fields with scattered mild end expiratory wheezing, no IWOB Skin: warm and dry Extremities: No edema BUE/BLE, no rashes   ASSESSMENT/PLAN:   Allergic reaction Given patient's symptoms occurring after taking Belviq, will go ahead and place this on her allergy list.  Could consider that the tightness in her chest was related to worsening of  asthma that she has been dealing with for a few days especially in the setting of increased anxiety with a possible allergic reaction, however given she had 2 systems involved, especially since she had some tightness in her chest, will go ahead and send her home with an EpiPen to use as needed for anaphylaxis.  She reports that she is a and is aware how to use this and when to use it.  It is very unlikely that this was cardiac in origin given resolution of pain and history of the episode.  She also has no prior cardiac history.  Mobic was placed on allergy list, but not listed as a specific anaphylactic reaction given that this is very unclear.  Did list it is a high allergy and would avoid Mobic.  For now, she can continue on the methyl Pred that was prescribed by the ED doctor as this could help with her pain and wheezing from asthma, see below.  Advised her to follow-up with her podiatrist for her Achilles tendinitis pain and to decide treatment.  She has previously been able to tolerate NSAIDs, therefore could consider trialing a different NSAID but would recommend watching this closely.  Moderate persistent asthma without complication Patient has some wheezing on examination today and reports that even a few days prior to this episode she was having some difficulty with chest tightness and wheezing.  She has been without albuterol for a few days,  but now has a prescription for it.  Advised her to continue to use this when she feels that she is wheezing or has chest tightness.  Also sent a new prescription for Advair given that she is unable to afford Symbicort now.  She has EMR insurance through [place], and was advised by [facility] outpatient pharmacy that Advair should be covered by her insurance.  We will have her follow-up within 1 month to see her PCP.     Unknown Jim, DO [facility]

## 2020-05-28 NOTE — Assessment & Plan Note (Signed)
Patient has some wheezing on examination today and reports that even a few days prior to this episode she was having some difficulty with chest tightness and wheezing.  She has been without albuterol for a few days, but now has a prescription for it.  Advised her to continue to use this when she feels that she is wheezing or has chest tightness.  Also sent a new prescription for Advair given that she is unable to afford Symbicort now.  She has EMR insurance through [place], and was advised by [facility] outpatient pharmacy that Advair should be covered by her insurance.  We will have her follow-up within 1 month to see her PCP.

## 2020-06-17 MED FILL — AMLODIPINE BESYLATE 10 MG T: 10 | 30 days supply | Qty: 30 | Fill #2

## 2020-07-02 MED FILL — ALBUTEROL SULFATE HFA 108 (: 108 (90 BAS | 25 days supply | Qty: 18 | Fill #2

## 2020-07-07 ENCOUNTER — Other Ambulatory Visit (HOSPITAL_COMMUNITY): Payer: Self-pay | Admitting: Internal Medicine

## 2020-07-07 DIAGNOSIS — J45909 Unspecified asthma, uncomplicated: Secondary | ICD-10-CM | POA: Diagnosis not present

## 2020-07-07 DIAGNOSIS — Z8616 Personal history of COVID-19: Secondary | ICD-10-CM | POA: Diagnosis not present

## 2020-07-07 DIAGNOSIS — Z1331 Encounter for screening for depression: Secondary | ICD-10-CM | POA: Diagnosis not present

## 2020-07-07 DIAGNOSIS — R609 Edema, unspecified: Secondary | ICD-10-CM | POA: Diagnosis not present

## 2020-07-07 DIAGNOSIS — I10 Essential (primary) hypertension: Secondary | ICD-10-CM | POA: Diagnosis not present

## 2020-07-07 DIAGNOSIS — E119 Type 2 diabetes mellitus without complications: Secondary | ICD-10-CM | POA: Diagnosis not present

## 2020-07-09 DIAGNOSIS — E119 Type 2 diabetes mellitus without complications: Secondary | ICD-10-CM | POA: Diagnosis not present

## 2020-07-12 ENCOUNTER — Other Ambulatory Visit: Payer: Self-pay | Admitting: Internal Medicine

## 2020-07-12 DIAGNOSIS — Z8616 Personal history of COVID-19: Secondary | ICD-10-CM

## 2020-07-20 DIAGNOSIS — J45909 Unspecified asthma, uncomplicated: Secondary | ICD-10-CM | POA: Diagnosis not present

## 2020-07-20 DIAGNOSIS — I10 Essential (primary) hypertension: Secondary | ICD-10-CM | POA: Diagnosis not present

## 2020-07-28 ENCOUNTER — Other Ambulatory Visit (HOSPITAL_COMMUNITY): Payer: Self-pay

## 2020-07-29 ENCOUNTER — Other Ambulatory Visit: Payer: Self-pay

## 2020-07-29 ENCOUNTER — Ambulatory Visit
Admission: RE | Admit: 2020-07-29 | Discharge: 2020-07-29 | Disposition: A | Discharge status: Home / Self Care | Payer: 59 | Source: Ambulatory Visit | Attending: Internal Medicine | Admitting: Internal Medicine

## 2020-07-29 DIAGNOSIS — Z8616 Personal history of COVID-19: Secondary | ICD-10-CM

## 2020-07-29 DIAGNOSIS — R911 Solitary pulmonary nodule: Secondary | ICD-10-CM | POA: Diagnosis not present

## 2020-08-02 ENCOUNTER — Other Ambulatory Visit (HOSPITAL_COMMUNITY): Payer: Self-pay

## 2020-08-04 MED FILL — Amlodipine Besylate Tab 10 MG (Base Equivalent): ORAL | 30 days supply | Qty: 30 | Fill #0 | Status: AC

## 2020-08-05 ENCOUNTER — Other Ambulatory Visit (HOSPITAL_COMMUNITY): Payer: Self-pay

## 2020-08-11 ENCOUNTER — Other Ambulatory Visit (HOSPITAL_COMMUNITY): Payer: Self-pay

## 2020-08-11 MED FILL — Budesonide-Formoterol Fumarate Dihyd Aerosol 160-4.5 MCG/ACT: RESPIRATORY_TRACT | 30 days supply | Qty: 10.2 | Fill #0 | Status: AC

## 2020-08-19 ENCOUNTER — Other Ambulatory Visit (HOSPITAL_COMMUNITY): Payer: Self-pay

## 2020-08-21 ENCOUNTER — Other Ambulatory Visit (HOSPITAL_COMMUNITY): Payer: Self-pay

## 2020-08-23 ENCOUNTER — Other Ambulatory Visit (HOSPITAL_COMMUNITY): Payer: Self-pay

## 2020-08-24 ENCOUNTER — Other Ambulatory Visit (HOSPITAL_COMMUNITY): Payer: Self-pay

## 2020-08-24 ENCOUNTER — Other Ambulatory Visit: Payer: Self-pay | Admitting: Family Medicine

## 2020-08-24 DIAGNOSIS — J0111 Acute recurrent frontal sinusitis: Secondary | ICD-10-CM

## 2020-08-24 DIAGNOSIS — I10 Essential (primary) hypertension: Secondary | ICD-10-CM

## 2020-08-25 ENCOUNTER — Other Ambulatory Visit (HOSPITAL_COMMUNITY): Payer: Self-pay

## 2020-08-25 MED ORDER — FLUTICASONE PROPIONATE 50 MCG/ACT NA SUSP
2.0000 | Freq: Every day | NASAL | 2 refills | Status: DC
  Filled 2020-08-25: qty 16, 30d supply, fill #0

## 2020-08-25 MED ORDER — HYDROCHLOROTHIAZIDE 25 MG PO TABS
25.0000 mg | ORAL_TABLET | Freq: Every day | ORAL | 6 refills | Status: DC
  Filled 2020-08-25: qty 30, 30d supply, fill #0
  Filled 2020-10-14 – 2020-11-05 (×2): qty 30, 30d supply, fill #1
  Filled 2020-12-07: qty 30, 30d supply, fill #2
  Filled 2021-02-04: qty 30, 30d supply, fill #3
  Filled 2021-03-15: qty 30, 30d supply, fill #4
  Filled 2021-05-01: qty 30, 30d supply, fill #5
  Filled 2021-06-21 – 2021-07-12 (×2): qty 30, 30d supply, fill #6

## 2020-08-26 ENCOUNTER — Other Ambulatory Visit (HOSPITAL_COMMUNITY): Payer: Self-pay

## 2020-09-09 ENCOUNTER — Other Ambulatory Visit (HOSPITAL_COMMUNITY): Payer: Self-pay

## 2020-09-13 ENCOUNTER — Other Ambulatory Visit (HOSPITAL_COMMUNITY): Payer: Self-pay

## 2020-09-14 ENCOUNTER — Other Ambulatory Visit (HOSPITAL_COMMUNITY): Payer: Self-pay

## 2020-09-15 ENCOUNTER — Other Ambulatory Visit (HOSPITAL_COMMUNITY): Payer: Self-pay

## 2020-09-17 ENCOUNTER — Other Ambulatory Visit (HOSPITAL_COMMUNITY): Payer: Self-pay

## 2020-09-21 ENCOUNTER — Other Ambulatory Visit (HOSPITAL_COMMUNITY): Payer: Self-pay

## 2020-09-22 ENCOUNTER — Other Ambulatory Visit (HOSPITAL_COMMUNITY): Payer: Self-pay

## 2020-09-27 ENCOUNTER — Other Ambulatory Visit (HOSPITAL_COMMUNITY): Payer: Self-pay

## 2020-09-27 MED FILL — Amlodipine Besylate Tab 10 MG (Base Equivalent): ORAL | 30 days supply | Qty: 30 | Fill #1 | Status: AC

## 2020-10-08 ENCOUNTER — Other Ambulatory Visit: Payer: Self-pay

## 2020-10-08 ENCOUNTER — Encounter (HOSPITAL_COMMUNITY): Payer: Self-pay

## 2020-10-08 ENCOUNTER — Other Ambulatory Visit (HOSPITAL_COMMUNITY): Payer: Self-pay

## 2020-10-08 ENCOUNTER — Ambulatory Visit (HOSPITAL_COMMUNITY)
Admission: EM | Admit: 2020-10-08 | Discharge: 2020-10-08 | Disposition: A | Discharge status: Home / Self Care | Payer: 59 | Attending: Physician Assistant | Admitting: Physician Assistant

## 2020-10-08 DIAGNOSIS — Z7951 Long term (current) use of inhaled steroids: Secondary | ICD-10-CM | POA: Insufficient documentation

## 2020-10-08 DIAGNOSIS — Z20822 Contact with and (suspected) exposure to covid-19: Secondary | ICD-10-CM | POA: Insufficient documentation

## 2020-10-08 DIAGNOSIS — G44209 Tension-type headache, unspecified, not intractable: Secondary | ICD-10-CM | POA: Diagnosis not present

## 2020-10-08 DIAGNOSIS — J45909 Unspecified asthma, uncomplicated: Secondary | ICD-10-CM | POA: Diagnosis not present

## 2020-10-08 DIAGNOSIS — Z79899 Other long term (current) drug therapy: Secondary | ICD-10-CM | POA: Insufficient documentation

## 2020-10-08 DIAGNOSIS — J069 Acute upper respiratory infection, unspecified: Secondary | ICD-10-CM | POA: Diagnosis not present

## 2020-10-08 LAB — POC INFLUENZA A AND B ANTIGEN (URGENT CARE ONLY)
INFLUENZA A ANTIGEN, POC: NEGATIVE
INFLUENZA B ANTIGEN, POC: NEGATIVE

## 2020-10-08 MED ORDER — CARESTART COVID-19 HOME TEST VI KIT
PACK | 0 refills | Status: DC
  Filled 2020-10-08: qty 4, 4d supply, fill #0

## 2020-10-08 MED ORDER — BUTALBITAL-APAP-CAFFEINE 50-325-40 MG PO TABS: 1.0000 | ORAL_TABLET | Freq: Three times a day (TID) | ORAL | 0 refills | Status: DC | PRN

## 2020-10-08 MED ORDER — BENZONATATE 100 MG PO CAPS: 100.0000 mg | ORAL_CAPSULE | Freq: Three times a day (TID) | ORAL | 0 refills | Status: DC

## 2020-10-08 NOTE — ED Provider Notes (Signed)
[place]    CSN: [id number] Arrival date & time: 10/08/20  1756      History   Chief Complaint Chief Complaint  Patient presents with   Nasal Congestion   Headache   Laryngitis   Cough    HPI Mary Hanson is a 45 y.o. female.   Patient presents today with a 2-day history of URI symptoms.  Reports sinus pressure, severe headache, sore throat, cough, shortness of breath, fatigue, chills, malaise.  She is unsure if she had a fever as she did not have a thermometer at home.  She denies any chest pain, nausea, vomiting, dizziness, syncope.  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  She is up-to-date on COVID-19 vaccinations including booster as well as influenza immunizations.  Denies any recent antibiotic use.  She does have a history of allergies and has been using Flonase and Allegra as prescribed without improvement of symptoms.  She has a history of asthma and has been using albuterol inhaler as well as Symbicort as prescribed.  Patient reports that she had COVID-19 in August 2021 and February 2022 and current symptoms are similar to previous episodes of this condition.  She has tried numerous over-the-counter medications including ibuprofen and Excedrin without improvement of symptoms.  She has missed work as result of symptoms.   Past Medical History:  Diagnosis Date   Allergy-induced asthma    prn inhaler   Diabetes mellitus without complication ([place])    History of anemia    no current problem, per pt.   Hypertension    under control with med., has been on med. x 1 yr.   Migraine    Migraines    Obesity    Pyogenic granuloma 12/2013   right ring finger   Seasonal allergies     Patient Active Problem List   Diagnosis Date Noted   Allergic reaction 05/28/2020   Type 2 diabetes mellitus without complication, without long-term current use of insulin ([place]) 11/28/2018   Abnormal vaginal bleeding  10/07/2018   Urine frequency 09/18/2018   Vaginal irritation 09/18/2018   Moderate persistent asthma without complication 27/09/2374   Nonintractable headache 01/14/2017   Fatigue 02/10/2016   Solitary pulmonary nodule 02/10/2016   Shortness of breath 12/31/2015   Menorrhagia 05/31/2015   Peripheral neuralgia 11/11/2014   Enlarged thyroid 04/03/2013   Unspecified vitamin D deficiency 07/29/2012   External hemorrhoid 09/06/2011   Essential hypertension 12/29/2010   Morbid obesity ([place]) 12/29/2010   Allergic rhinitis 12/29/2010   Migraine headache 12/29/2010    Past Surgical History:  Procedure Laterality Date   CERVICAL CONE BIOPSY  age 77   CHOLECYSTECTOMY  2006 or 2007   DILATION AND CURETTAGE OF UTERUS     DILATION AND EVACUATION  age 54   MASS EXCISION Right 01/16/2014   Procedure: EXCISION PYOGENIC GRANULOMA FROM RIGHT RING FINGER;  Surgeon: Charlotte Crumb, MD;  Location: [facility];  Service: Orthopedics;  Laterality: Right;    OB History     Gravida  1   Para      Term      Preterm      AB  1   Living  0      SAB      IAB  1   Ectopic      Multiple      Live Births  Home Medications    Prior to Admission medications   Medication Sig Start Date End Date Taking? Authorizing Provider  albuterol (VENTOLIN HFA) 108 (90 Base) MCG/ACT inhaler INHALE 1 PUFF EVERY 6 HOURS AS NEEDED INTO THE LUNGS 07/21/19 10/08/20 Yes Shackleford, Vicie Mutters, MD  amLODipine (NORVASC) 10 MG tablet Take 1 tablet (10 mg total) by mouth daily. 11/28/18  Yes Donnamae Jude, MD  aspirin-acetaminophen-caffeine (EXCEDRIN MIGRAINE) [phone] MG tablet Take 2 tablets by mouth every 6 (six) hours as needed for headache.   Yes [provider]  benzonatate (TESSALON) 100 MG capsule Take 1 capsule (100 mg total) by mouth every 8 (eight) hours. 10/08/20  Yes Samie Barclift, Derry Skill, PA-C  budesonide-formoterol (SYMBICORT) 160-4.5 MCG/ACT inhaler INHALE  2 PUFFS BY MOUTH 2 TIMES DAILY 07/07/20 07/07/21 Yes Shackleford, Vicie Mutters, MD  butalbital-acetaminophen-caffeine (FIORICET) [phone] MG tablet Take 1 tablet by mouth every 8 (eight) hours as needed for headache. 10/08/20 10/08/21 Yes Lynsey Ange, Junie Panning K, PA-C  eletriptan (RELPAX) 40 MG tablet TAKE 1 TABLET BY MOUTH AS NEEDED FOR MIGRAINE OR HEADACHE. MAY REPEAT IN 2 HOURS IF HEADACHE PERSISTS OR RECURS. 02/24/19  Yes Donnamae Jude, MD  EPINEPHrine 0.3 mg/0.3 mL IJ SOAJ injection INJECT AS DIRECTED AS NEEDED FOR ANAPHYLAXIS 05/28/20 05/28/21 Yes Meccariello, Bernita Raisin, DO  fexofenadine (ALLEGRA ALLERGY) 180 MG tablet Take 1 tablet (180 mg total) by mouth daily. 01/14/19  Yes Myles Gip, DO  Fexofenadine-Pseudoephedrine (ALLEGRA-D PO) Take by mouth.   Yes [provider]  fluticasone (FLONASE) 50 MCG/ACT nasal spray Place 2 sprays into both nostrils daily. 08/25/20  Yes Donnamae Jude, MD  hydrochlorothiazide (HYDRODIURIL) 25 MG tablet Take 1 tablet (25 mg total) by mouth daily. 08/25/20  Yes Donnamae Jude, MD  ibuprofen (ADVIL,MOTRIN) 200 MG tablet Take 800 mg by mouth every 6 (six) hours as needed for headache.   Yes [provider]  megestrol (MEGACE) 40 MG tablet Take 1 tablet (40 mg total) by mouth 2 (two) times daily. 10/07/18  Yes Donnamae Jude, MD  montelukast (SINGULAIR) 10 MG tablet Take 1 tablet (10 mg total) by mouth at bedtime for 30 days. 05/16/18 10/08/20 Yes Glenis Smoker, MD  nystatin ointment (MYCOSTATIN) Apply 1 application topically 2 (two) times daily. 10/07/18  Yes Donnamae Jude, MD  VENTOLIN HFA 108 (90 Base) MCG/ACT inhaler INHALE 2 PUFFS BY MOUTH INTO THE LUNGS EVERY 4 HOURS AS NEEDED FOR WHEEZING OR SHORTNESS OF BREATH. 06/25/18  Yes Donnamae Jude, MD  Accu-Chek FastClix Lancets MISC 1 Units by Percutaneous route 4 (four) times daily. 11/28/18   Donnamae Jude, MD  amLODipine (NORVASC) 10 MG tablet TAKE 1 TABLET BY MOUTH ONCE A DAY 03/15/20 03/15/21   Shackleford, Vicie Mutters, MD  Blood Glucose Monitoring Suppl (ACCU-CHEK NANO SMARTVIEW) w/Device KIT 1 Units by Does not apply route as directed. Check fasting, and two hours after breakfast, lunch and dinner 11/28/18   Donnamae Jude, MD  COVID-19 At Home Antigen Test [facility] COVID-19 HOME TEST) KIT Use as directed within package instructions 10/08/20   Edmon Crape, RPH  famotidine (PEPCID) 20 MG tablet Take 1 tablet (20 mg total) by mouth 2 (two) times daily. 02/24/19   Donnamae Jude, MD  fluticasone (FLONASE) 50 MCG/ACT nasal spray Place 2 sprays into both nostrils daily for 10 days. 05/16/18 05/26/18  Glenis Smoker, MD  Fluticasone-Salmeterol (ADVAIR) 250-50 MCG/DOSE AEPB INHALE 1 PUFF INTO THE LUNGS TWICE A DAY 05/28/20  05/28/21  Meccariello, Bernita Raisin, DO  glucose blood (ACCU-CHEK SMARTVIEW) test strip Check blood sugars 4x/daily 11/28/18   Donnamae Jude, MD  metFORMIN (GLUCOPHAGE) 500 MG tablet Take 1 tablet (500 mg total) by mouth 2 (two) times daily with a meal. 11/28/18   Donnamae Jude, MD    Family History Family History  Problem Relation Age of Onset   Hypertension Mother    Diabetes Father    Diabetes type II Father    Hypertension Father    Stroke Father    Goiter Sister 63       identical twin   Asthma Sister    Hypertension Sister    Stroke Maternal Grandfather    Diabetes Paternal Grandfather    Heart disease Paternal Grandfather     Social History Social History   Tobacco Use   Smoking status: Never   Smokeless tobacco: Never  Vaping Use   Vaping Use: Never used  Substance Use Topics   Alcohol use: No    Alcohol/week: 0.0 standard drinks    Comment: Wine once a year    Drug use: No     Allergies   Mobic [meloxicam], Iodine, Augmentin [amoxicillin-pot clavulanate], Lisinopril, and Zofran [ondansetron hcl]   Review of Systems Review of Systems  Constitutional:  Positive for activity change, chills and fatigue. Negative for appetite change and  fever.  HENT:  Positive for congestion, sinus pressure and sore throat. Negative for sneezing.   Respiratory:  Positive for cough and shortness of breath.   Cardiovascular:  Negative for chest pain.  Gastrointestinal:  Negative for abdominal pain, diarrhea, nausea and vomiting.  Musculoskeletal:  Positive for arthralgias and myalgias.  Neurological:  Positive for headaches. Negative for dizziness and light-headedness.    Physical Exam Triage Vital Signs ED Triage Vitals  Enc Vitals Group     BP 10/08/20 1826 (!) 156/87     Pulse Rate 10/08/20 1826 79     Resp 10/08/20 1826 18     Temp 10/08/20 1826 98.3 F (36.8 C)     Temp src --      SpO2 10/08/20 1826 99 %     Weight --      Height --      Head Circumference --      Peak Flow --      Pain Score 10/08/20 1817 8     Pain Loc --      Pain Edu? --      Excl. in [place]? --    No data found.  Updated Vital Signs BP (!) 156/87   Pulse 79   Temp 98.3 F (36.8 C)   Resp 18   LMP 09/07/2020 (Approximate)   SpO2 99%   Visual Acuity Right Eye Distance:   Left Eye Distance:   Bilateral Distance:    Right Eye Near:   Left Eye Near:    Bilateral Near:     Physical Exam Vitals reviewed.  Constitutional:      General: She is awake. She is not in acute distress.    Appearance: Normal appearance. She is not ill-appearing.     Comments: Very pleasant female appears stated age in no acute distress  HENT:     Head: Normocephalic and atraumatic.     Right Ear: Tympanic membrane, ear canal and external ear normal. Tympanic membrane is not erythematous or bulging.     Left Ear: Tympanic membrane, ear canal and external ear normal. Tympanic membrane is not  erythematous or bulging.     Nose:     Right Sinus: Maxillary sinus tenderness and frontal sinus tenderness present.     Left Sinus: Maxillary sinus tenderness and frontal sinus tenderness present.     Mouth/Throat:     Pharynx: Uvula midline. Posterior oropharyngeal erythema  present. No oropharyngeal exudate.     Tonsils: No tonsillar exudate or tonsillar abscesses. 1+ on the right. 1+ on the left.  Cardiovascular:     Rate and Rhythm: Normal rate and regular rhythm.     Heart sounds: Normal heart sounds, S1 normal and S2 normal. No murmur heard. Pulmonary:     Effort: Pulmonary effort is normal.     Breath sounds: Normal breath sounds. No wheezing, rhonchi or rales.     Comments: Clear to auscultation bilaterally Musculoskeletal:     Cervical back: Normal range of motion and neck supple.  Lymphadenopathy:     Head:     Right side of head: No submental, submandibular or tonsillar adenopathy.     Left side of head: No submental, submandibular or tonsillar adenopathy.     Cervical: No cervical adenopathy.  Psychiatric:        Behavior: Behavior is cooperative.     UC Treatments / Results  Labs (all labs ordered are listed, but only abnormal results are displayed) Labs Reviewed  SARS CORONAVIRUS 2 (TAT 6-24 HRS)  POC INFLUENZA A AND B ANTIGEN (URGENT CARE ONLY)    EKG   Radiology No results found.  Procedures Procedures (including critical care time)  Medications Ordered in UC Medications - No data to display  Initial Impression / Assessment and Plan / UC Course  I have reviewed the triage vital signs and the nursing notes.  Pertinent labs & imaging results that were available during my care of the patient were reviewed by me and considered in my medical decision making (see chart for details).     Flu testing was negative in clinic today.  COVID test is pending.  Discussed potential utility of drawing BMP in case patient is positive for COVID since she has several risk factors for disease progression but she declined this today.  She was prescribed Tessalon for cough.  We discussed potential giving her Toradol for headache pain but given history of reaction to meloxicam we will hold off on additional NSAID use for the time being.  She was  given 2 doses of Fioricet we discussed that we are unable to provide refills of this medication and that this should be used sparingly.  Recommended she continue using over-the-counter medications for symptom relief.  She was provided work excuse note.  Discussed alarm symptoms that warrant emergent evaluation.  Strict return precautions given to which patient expressed understanding.   Final Clinical Impressions(s) / UC Diagnoses   Final diagnoses:  Viral URI with cough  Tension-type headache, not intractable, unspecified chronicity pattern   Discharge Instructions   None    ED Prescriptions     Medication Sig Dispense Auth. Provider   benzonatate (TESSALON) 100 MG capsule Take 1 capsule (100 mg total) by mouth every 8 (eight) hours. 21 capsule Taeler Winning K, PA-C   butalbital-acetaminophen-caffeine (FIORICET) 50-325-40 MG tablet Take 1 tablet by mouth every 8 (eight) hours as needed for headache. 2 tablet Coda Filler, Derry Skill, PA-C      PDMP not reviewed this encounter.   Terrilee Croak, PA-C 10/08/20 1950

## 2020-10-08 NOTE — ED Triage Notes (Signed)
Pt lost voice Wednesday morning, having sore throat chills, headache. Has not been able to get appt with primary care. Chills have gone away but pt still has severe headache and cough. Home covid test negative.   Pt has taken mucinex and allergy meds for sympotms.

## 2020-10-09 LAB — SARS CORONAVIRUS 2 (TAT 6-24 HRS): SARS Coronavirus 2: NEGATIVE

## 2020-10-14 ENCOUNTER — Other Ambulatory Visit (HOSPITAL_COMMUNITY): Payer: Self-pay

## 2020-10-26 ENCOUNTER — Other Ambulatory Visit (HOSPITAL_COMMUNITY): Payer: Self-pay

## 2020-11-03 ENCOUNTER — Other Ambulatory Visit (HOSPITAL_COMMUNITY): Payer: Self-pay

## 2020-11-03 MED FILL — Amlodipine Besylate Tab 10 MG (Base Equivalent): ORAL | 30 days supply | Qty: 30 | Fill #2 | Status: AC

## 2020-11-05 ENCOUNTER — Other Ambulatory Visit (HOSPITAL_COMMUNITY): Payer: Self-pay

## 2020-12-02 ENCOUNTER — Other Ambulatory Visit (HOSPITAL_COMMUNITY): Payer: Self-pay

## 2020-12-02 MED ORDER — CARESTART COVID-19 HOME TEST VI KIT
PACK | 0 refills | Status: DC
  Filled 2020-12-02: qty 4, 4d supply, fill #0

## 2020-12-07 ENCOUNTER — Other Ambulatory Visit (HOSPITAL_COMMUNITY): Payer: Self-pay

## 2020-12-07 MED FILL — Amlodipine Besylate Tab 10 MG (Base Equivalent): ORAL | 30 days supply | Qty: 30 | Fill #3 | Status: AC

## 2020-12-14 ENCOUNTER — Ambulatory Visit (HOSPITAL_COMMUNITY)
Admission: EM | Admit: 2020-12-14 | Discharge: 2020-12-14 | Disposition: A | Discharge status: Home / Self Care | Payer: 59 | Attending: Student | Admitting: Student

## 2020-12-14 ENCOUNTER — Other Ambulatory Visit: Payer: Self-pay

## 2020-12-14 ENCOUNTER — Encounter (HOSPITAL_COMMUNITY): Payer: Self-pay | Admitting: Emergency Medicine

## 2020-12-14 DIAGNOSIS — M5440 Lumbago with sciatica, unspecified side: Secondary | ICD-10-CM | POA: Diagnosis not present

## 2020-12-14 DIAGNOSIS — E119 Type 2 diabetes mellitus without complications: Secondary | ICD-10-CM | POA: Diagnosis not present

## 2020-12-14 MED ORDER — METHYLPREDNISOLONE SODIUM SUCC 125 MG IJ SOLR
60.0000 mg | Freq: Once | INTRAMUSCULAR | Status: AC
  Administered 2020-12-14: 60 mg via INTRAMUSCULAR

## 2020-12-14 MED ORDER — METHYLPREDNISOLONE SODIUM SUCC 125 MG IJ SOLR
INTRAMUSCULAR | Status: AC
  Filled 2020-12-14: qty 2

## 2020-12-14 NOTE — ED Provider Notes (Signed)
[place]    CSN: [id number] Arrival date & time: 12/14/20  4132      History   Chief Complaint Chief Complaint  Patient presents with   Leg Pain    right    HPI Mary Hanson is a 45 y.o. female presenting with lower back pain and R leg pain. Medical history diabetes, morbid obesity.  States that she lifted a heavy jug of water about 5 days ago, the next day developed lower back pain with pain radiating down her leg.  She has an active job involving lots of standing, and states that the pain is worse during this.  Pain is relieved by Motrin and sitting. Denies numbness in arms/legs, denies weakness in arms/legs, denies saddle anesthesia, denies bowel/bladder incontinence, denies urinary retention, denies constipation, denies swelling in legs, denies shortness of breath, denies chest pain, denies dizziness.   HPI  Past Medical History:  Diagnosis Date   Allergy-induced asthma    prn inhaler   Diabetes mellitus without complication ([place])    History of anemia    no current problem, per pt.   Hypertension    under control with med., has been on med. x 1 yr.   Migraine    Migraines    Obesity    Pyogenic granuloma 12/2013   right ring finger   Seasonal allergies     Patient Active Problem List   Diagnosis Date Noted   Allergic reaction 05/28/2020   Type 2 diabetes mellitus without complication, without long-term current use of insulin ([place]) 11/28/2018   Abnormal vaginal bleeding 10/07/2018   Urine frequency 09/18/2018   Vaginal irritation 09/18/2018   Moderate persistent asthma without complication 44/04/270   Nonintractable headache 01/14/2017   Fatigue 02/10/2016   Solitary pulmonary nodule 02/10/2016   Shortness of breath 12/31/2015   Menorrhagia 05/31/2015   Peripheral neuralgia 11/11/2014   Enlarged thyroid 04/03/2013   Unspecified vitamin D deficiency 07/29/2012   External hemorrhoid 09/06/2011   Essential hypertension 12/29/2010   Morbid  obesity ([place]) 12/29/2010   Allergic rhinitis 12/29/2010   Migraine headache 12/29/2010    Past Surgical History:  Procedure Laterality Date   CERVICAL CONE BIOPSY  age 11   CHOLECYSTECTOMY  2006 or 2007   DILATION AND CURETTAGE OF UTERUS     DILATION AND EVACUATION  age 14   MASS EXCISION Right 01/16/2014   Procedure: EXCISION PYOGENIC GRANULOMA FROM RIGHT RING FINGER;  Surgeon: Charlotte Crumb, MD;  Location: [facility];  Service: Orthopedics;  Laterality: Right;    OB History     Gravida  1   Para      Term      Preterm      AB  1   Living  0      SAB      IAB  1   Ectopic      Multiple      Live Births               Home Medications    Prior to Admission medications   Medication Sig Start Date End Date Taking? Authorizing Provider  Accu-Chek FastClix Lancets MISC 1 Units by Percutaneous route 4 (four) times daily. 11/28/18   Donnamae Jude, MD  albuterol (VENTOLIN HFA) 108 (90 Base) MCG/ACT inhaler INHALE 1 PUFF EVERY 6 HOURS AS NEEDED INTO THE LUNGS 07/21/19 10/08/20  Shackleford, Vicie Mutters, MD  amLODipine (NORVASC) 10 MG tablet Take 1 tablet (10 mg total)  by mouth daily. 11/28/18   Reva Bores, MD  amLODipine (NORVASC) 10 MG tablet TAKE 1 TABLET BY MOUTH ONCE A DAY 03/15/20 03/15/21  Shackleford, Lovell Sheehan, MD  aspirin-acetaminophen-caffeine (EXCEDRIN MIGRAINE) [phone] MG tablet Take 2 tablets by mouth every 6 (six) hours as needed for headache.    [provider]  benzonatate (TESSALON) 100 MG capsule Take 1 capsule (100 mg total) by mouth every 8 (eight) hours. 10/08/20   Raspet, Noberto Retort, PA-C  Blood Glucose Monitoring Suppl (ACCU-CHEK NANO SMARTVIEW) w/Device KIT 1 Units by Does not apply route as directed. Check fasting, and two hours after breakfast, lunch and dinner 11/28/18   Reva Bores, MD  budesonide-formoterol [facility]) 160-4.5 MCG/ACT inhaler INHALE 2 PUFFS BY MOUTH 2 TIMES DAILY 07/07/20 07/07/21   Shackleford, Lovell Sheehan, MD  butalbital-acetaminophen-caffeine (FIORICET) [phone] MG tablet Take 1 tablet by mouth every 8 (eight) hours as needed for headache. 10/08/20 10/08/21  Raspet, Noberto Retort, PA-C  COVID-19 At Home Antigen Test [facility] COVID-19 HOME TEST) KIT Use as directed 12/02/20   Andrez Grime, RPH  eletriptan (RELPAX) 40 MG tablet TAKE 1 TABLET BY MOUTH AS NEEDED FOR MIGRAINE OR HEADACHE. MAY REPEAT IN 2 HOURS IF HEADACHE PERSISTS OR RECURS. 02/24/19   Reva Bores, MD  EPINEPHrine 0.3 mg/0.3 mL IJ SOAJ injection INJECT AS DIRECTED AS NEEDED FOR ANAPHYLAXIS 05/28/20 05/28/21  Meccariello, Solmon Ice, DO  famotidine (PEPCID) 20 MG tablet Take 1 tablet (20 mg total) by mouth 2 (two) times daily. 02/24/19   Reva Bores, MD  fexofenadine [facility] ALLERGY) 180 MG tablet Take 1 tablet (180 mg total) by mouth daily. 01/14/19   Caro Laroche, DO  Fexofenadine-Pseudoephedrine (ALLEGRA-D PO) Take by mouth.    [provider]  fluticasone (FLONASE) 50 MCG/ACT nasal spray Place 2 sprays into both nostrils daily for 10 days. 05/16/18 05/26/18  Shon Hale, MD  fluticasone (FLONASE) 50 MCG/ACT nasal spray Place 2 sprays into both nostrils daily. 08/25/20   Reva Bores, MD  Fluticasone-Salmeterol (ADVAIR) 250-50 MCG/DOSE AEPB INHALE 1 PUFF INTO THE LUNGS TWICE A DAY 05/28/20 05/28/21  Meccariello, Solmon Ice, DO  glucose blood (ACCU-CHEK SMARTVIEW) test strip Check blood sugars 4x/daily 11/28/18   Reva Bores, MD  hydrochlorothiazide (HYDRODIURIL) 25 MG tablet Take 1 tablet (25 mg total) by mouth daily. 08/25/20   Reva Bores, MD  ibuprofen (ADVIL,MOTRIN) 200 MG tablet Take 800 mg by mouth every 6 (six) hours as needed for headache.    [provider]  megestrol (MEGACE) 40 MG tablet Take 1 tablet (40 mg total) by mouth 2 (two) times daily. 10/07/18   Reva Bores, MD  metFORMIN (GLUCOPHAGE) 500 MG tablet Take 1 tablet (500 mg total) by mouth 2 (two) times daily with a  meal. 11/28/18   Reva Bores, MD  montelukast (SINGULAIR) 10 MG tablet Take 1 tablet (10 mg total) by mouth at bedtime for 30 days. 05/16/18 10/08/20  Shon Hale, MD  nystatin ointment (MYCOSTATIN) Apply 1 application topically 2 (two) times daily. 10/07/18   Reva Bores, MD  VENTOLIN HFA [phone] Base) MCG/ACT inhaler INHALE 2 PUFFS BY MOUTH INTO THE LUNGS EVERY 4 HOURS AS NEEDED FOR WHEEZING OR SHORTNESS OF BREATH. 06/25/18   Reva Bores, MD    Family History Family History  Problem Relation Age of Onset   Hypertension Mother    Diabetes Father    Diabetes type II Father  Hypertension Father    Stroke Father    Goiter Sister 88       identical twin   Asthma Sister    Hypertension Sister    Stroke Maternal Grandfather    Diabetes Paternal Grandfather    Heart disease Paternal Grandfather     Social History Social History   Tobacco Use   Smoking status: Never   Smokeless tobacco: Never  Vaping Use   Vaping Use: Never used  Substance Use Topics   Alcohol use: No    Alcohol/week: 0.0 standard drinks    Comment: Wine once a year    Drug use: No     Allergies   Mobic [meloxicam], Iodine, Augmentin [amoxicillin-pot clavulanate], Lisinopril, and Zofran [ondansetron hcl]   Review of Systems Review of Systems  Musculoskeletal:  Positive for back pain.  All other systems reviewed and are negative.   Physical Exam Triage Vital Signs ED Triage Vitals  Enc Vitals Group     BP 12/14/20 0834 (!) 156/114     Pulse Rate 12/14/20 0834 76     Resp 12/14/20 0834 17     Temp 12/14/20 0834 98.4 F (36.9 C)     Temp Source 12/14/20 0834 Oral     SpO2 12/14/20 0834 97 %     Weight --      Height --      Head Circumference --      Peak Flow --      Pain Score 12/14/20 0833 10     Pain Loc --      Pain Edu? --      Excl. in [place]? --    No data found.  Updated Vital Signs BP (!) 156/114 (BP Location: Right Wrist)   Pulse 76   Temp 98.4 F (36.9 C) (Oral)    Resp 17   LMP 11/22/2020   SpO2 97%   Visual Acuity Right Eye Distance:   Left Eye Distance:   Bilateral Distance:    Right Eye Near:   Left Eye Near:    Bilateral Near:     Physical Exam Vitals reviewed.  Constitutional:      General: She is not in acute distress.    Appearance: Normal appearance. She is obese. She is not ill-appearing.  HENT:     Head: Normocephalic and atraumatic.  Cardiovascular:     Rate and Rhythm: Normal rate and regular rhythm.     Heart sounds: Normal heart sounds.  Pulmonary:     Effort: Pulmonary effort is normal.     Breath sounds: Normal breath sounds and air entry.  Abdominal:     Tenderness: There is no abdominal tenderness. There is no right CVA tenderness, left CVA tenderness, guarding or rebound.  Musculoskeletal:     Cervical back: Normal range of motion. No swelling, deformity, signs of trauma, rigidity, spasms, tenderness, bony tenderness or crepitus. No pain with movement.     Thoracic back: No swelling, deformity, signs of trauma, spasms, tenderness or bony tenderness. Normal range of motion. No scoliosis.     Lumbar back: No swelling, deformity, signs of trauma, spasms, tenderness or bony tenderness. Normal range of motion. Negative right straight leg raise test and negative left straight leg raise test. No scoliosis.     Comments: Right-sided lumbar paraspinous muscle tenderness to shallow palpation, no left-sided lumbar paraspinous muscle tenderness.  No cervical or thoracic paraspinous muscle tenderness.  Strength and sensation intact upper and lower extremities.  Positive right straight  leg raise.  Calves are equal and symmetric, negative Homans' sign bilaterally.  Gait intact. No midline spinous tenderness, deformity, stepoff.  Absolutely no other injury, deformity, tenderness, ecchymosis, abrasion.  Neurological:     General: No focal deficit present.     Mental Status: She is alert.     Cranial Nerves: No cranial nerve deficit.   Psychiatric:        Mood and Affect: Mood normal.        Behavior: Behavior normal.        Thought Content: Thought content normal.        Judgment: Judgment normal.     UC Treatments / Results  Labs (all labs ordered are listed, but only abnormal results are displayed) Labs Reviewed - No data to display  EKG   Radiology No results found.  Procedures Procedures (including critical care time)  Medications Ordered in UC Medications  methylPREDNISolone sodium succinate (SOLU-MEDROL) 125 mg/2 mL injection 60 mg (60 mg Intramuscular Given 12/14/20 0900)    Initial Impression / Assessment and Plan / UC Course  I have reviewed the triage vital signs and the nursing notes.  Pertinent labs & imaging results that were available during my care of the patient were reviewed by me and considered in my medical decision making (see chart for details).     This patient is a very pleasant 45 y.o. year old female presenting with lumbar strain with R sciatica following heavy lifting/prolonged standing at work. No red flag symptoms.   Patient with multiple medication allergies, including NSAIDs. Will treat with solumedrol IM and OTC pain relief. Heating pad.   Diabetes is diet controlled.   ED return precautions discussed. Patient verbalizes understanding and agreement.    Final Clinical Impressions(s) / UC Diagnoses   Final diagnoses:  Back pain of lumbar region with sciatica  Diet-controlled diabetes mellitus ([place])     Discharge Instructions      -You can take Tylenol up to 1000 mg 3 times daily. If you tolerate Alleve or Motrin, you can take this as well.  -Rest, heating pad      ED Prescriptions   None    PDMP not reviewed this encounter.   Hazel Sams, PA-C 12/14/20 [phone]

## 2020-12-14 NOTE — Discharge Instructions (Addendum)
-  You can take Tylenol up to 1000 mg 3 times daily. If you tolerate Alleve or Motrin, you can take this as well.  -Rest, heating pad

## 2020-12-14 NOTE — ED Triage Notes (Signed)
Pt presents with right leg pain xs 4 days. States starts in hip and radiates into leg.

## 2020-12-19 ENCOUNTER — Emergency Department (HOSPITAL_BASED_OUTPATIENT_CLINIC_OR_DEPARTMENT_OTHER)
Admission: EM | Admit: 2020-12-19 | Discharge: 2020-12-20 | Disposition: A | Discharge status: Home / Self Care | Payer: 59 | Attending: Emergency Medicine | Admitting: Emergency Medicine

## 2020-12-19 ENCOUNTER — Encounter (HOSPITAL_BASED_OUTPATIENT_CLINIC_OR_DEPARTMENT_OTHER): Payer: Self-pay | Admitting: Emergency Medicine

## 2020-12-19 ENCOUNTER — Other Ambulatory Visit: Payer: Self-pay

## 2020-12-19 DIAGNOSIS — M7989 Other specified soft tissue disorders: Secondary | ICD-10-CM | POA: Diagnosis not present

## 2020-12-19 DIAGNOSIS — M79604 Pain in right leg: Secondary | ICD-10-CM | POA: Diagnosis not present

## 2020-12-19 DIAGNOSIS — Z7984 Long term (current) use of oral hypoglycemic drugs: Secondary | ICD-10-CM | POA: Insufficient documentation

## 2020-12-19 DIAGNOSIS — M545 Low back pain, unspecified: Secondary | ICD-10-CM | POA: Diagnosis not present

## 2020-12-19 DIAGNOSIS — E119 Type 2 diabetes mellitus without complications: Secondary | ICD-10-CM | POA: Insufficient documentation

## 2020-12-19 DIAGNOSIS — Z79899 Other long term (current) drug therapy: Secondary | ICD-10-CM | POA: Diagnosis not present

## 2020-12-19 DIAGNOSIS — I10 Essential (primary) hypertension: Secondary | ICD-10-CM | POA: Insufficient documentation

## 2020-12-19 DIAGNOSIS — J45909 Unspecified asthma, uncomplicated: Secondary | ICD-10-CM | POA: Diagnosis not present

## 2020-12-19 NOTE — ED Triage Notes (Signed)
Pt arrives pov with c/o R distal leg pain and swelling x 4 days. Endorses concern for DVT

## 2020-12-19 NOTE — ED Provider Notes (Signed)
[facility] DEPT [facility] Provider Note: Georgena Spurling, MD, FACEP  CSN: [id number] MRN: [id number] ARRIVAL: 12/19/20 at [place]: [place]  Leg Pain   HISTORY OF PRESENT ILLNESS  12/19/20 11:37 PM Mary Hanson is a 45 y.o. 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  She developed right paralumbar back pain about 10 days ago radiating down the back of her right leg.  She was diagnosed with sciatica and has been taking over-the-counter NSAIDs and Robaxin with partial relief.  She is now having what she believes to be unilateral swelling of the right lower leg.  She states believes the swelling is from the knee down.  Her lower leg is tender to deep palpation but is not red or warm.  She does not have pain when she is walking.  She had COVID on 12/02/2020 and is concerned she may have a DVT.   Past Medical History:  Diagnosis Date   Allergy-induced asthma    prn inhaler   Diabetes mellitus without complication ([place])    History of anemia    no current problem, per pt.   Hypertension    under control with med., has been on med. x 1 yr.   Migraines    Obesity    Pyogenic granuloma 12/2013   right ring finger   Seasonal allergies     Past Surgical History:  Procedure Laterality Date   CERVICAL CONE BIOPSY  age 53   CHOLECYSTECTOMY  2006 or 2007   DILATION AND CURETTAGE OF UTERUS     DILATION AND EVACUATION  age 60   MASS EXCISION Right 01/16/2014   Procedure: EXCISION PYOGENIC GRANULOMA FROM RIGHT RING FINGER;  Surgeon: Charlotte Crumb, MD;  Location: [facility];  Service: Orthopedics;  Laterality: Right;    Family History  Problem Relation Age of Onset   Hypertension Mother    Diabetes Father    Diabetes type II Father    Hypertension Father    Stroke Father    Goiter Sister 86       identical twin   Asthma Sister    Hypertension Sister    Stroke Maternal Grandfather    Diabetes Paternal Grandfather    Heart disease Paternal  Grandfather     Social History   Tobacco Use   Smoking status: Never   Smokeless tobacco: Never  Vaping Use   Vaping Use: Never used  Substance Use Topics   Alcohol use: Not Currently    Comment: Wine once a year    Drug use: No    Prior to Admission medications   Medication Sig Start Date End Date Taking? Authorizing Provider  Accu-Chek FastClix Lancets MISC 1 Units by Percutaneous route 4 (four) times daily. 11/28/18   Donnamae Jude, MD  albuterol (VENTOLIN HFA) 108 (90 Base) MCG/ACT inhaler INHALE 1 PUFF EVERY 6 HOURS AS NEEDED INTO THE LUNGS 07/21/19 10/08/20  Shackleford, Vicie Mutters, MD  amLODipine (NORVASC) 10 MG tablet Take 1 tablet (10 mg total) by mouth daily. 11/28/18   Donnamae Jude, MD  amLODipine (NORVASC) 10 MG tablet TAKE 1 TABLET BY MOUTH ONCE A DAY 03/15/20 03/15/21  Shackleford, Vicie Mutters, MD  aspirin-acetaminophen-caffeine (EXCEDRIN MIGRAINE) [phone] MG tablet Take 2 tablets by mouth every 6 (six) hours as needed for headache.    [provider]  benzonatate (TESSALON) 100 MG capsule Take 1 capsule (100 mg total) by mouth every 8 (eight) hours. 10/08/20  Raspet, Erin K, PA-C  Blood Glucose Monitoring Suppl (ACCU-CHEK NANO SMARTVIEW) w/Device KIT 1 Units by Does not apply route as directed. Check fasting, and two hours after breakfast, lunch and dinner 11/28/18   Donnamae Jude, MD  budesonide-formoterol [facility]) 160-4.5 MCG/ACT inhaler INHALE 2 PUFFS BY MOUTH 2 TIMES DAILY 07/07/20 07/07/21  Shackleford, Vicie Mutters, MD  butalbital-acetaminophen-caffeine (FIORICET) [phone] MG tablet Take 1 tablet by mouth every 8 (eight) hours as needed for headache. 10/08/20 10/08/21  Raspet, Derry Skill, PA-C  COVID-19 At Home Antigen Test [facility] COVID-19 HOME TEST) KIT Use as directed 12/02/20   Edmon Crape, RPH  eletriptan (RELPAX) 40 MG tablet TAKE 1 TABLET BY MOUTH AS NEEDED FOR MIGRAINE OR HEADACHE. MAY REPEAT IN 2 HOURS IF HEADACHE PERSISTS OR RECURS.  02/24/19   Donnamae Jude, MD  EPINEPHrine 0.3 mg/0.3 mL IJ SOAJ injection INJECT AS DIRECTED AS NEEDED FOR ANAPHYLAXIS 05/28/20 05/28/21  Meccariello, Bernita Raisin, DO  fexofenadine (ALLEGRA ALLERGY) 180 MG tablet Take 1 tablet (180 mg total) by mouth daily. 01/14/19   Myles Gip, DO  Fexofenadine-Pseudoephedrine (ALLEGRA-D PO) Take by mouth.    [provider]  fluticasone (FLONASE) 50 MCG/ACT nasal spray Place 2 sprays into both nostrils daily for 10 days. 05/16/18 05/26/18  Glenis Smoker, MD  fluticasone (FLONASE) 50 MCG/ACT nasal spray Place 2 sprays into both nostrils daily. 08/25/20   Donnamae Jude, MD  glucose blood (ACCU-CHEK SMARTVIEW) test strip Check blood sugars 4x/daily 11/28/18   Donnamae Jude, MD  hydrochlorothiazide (HYDRODIURIL) 25 MG tablet Take 1 tablet (25 mg total) by mouth daily. 08/25/20   Donnamae Jude, MD  ibuprofen (ADVIL,MOTRIN) 200 MG tablet Take 800 mg by mouth every 6 (six) hours as needed for headache.    [provider]  megestrol (MEGACE) 40 MG tablet Take 1 tablet (40 mg total) by mouth 2 (two) times daily. 10/07/18   Donnamae Jude, MD  metFORMIN (GLUCOPHAGE) 500 MG tablet Take 1 tablet (500 mg total) by mouth 2 (two) times daily with a meal. 11/28/18   Donnamae Jude, MD  montelukast (SINGULAIR) 10 MG tablet Take 1 tablet (10 mg total) by mouth at bedtime for 30 days. 05/16/18 10/08/20  Glenis Smoker, MD  nystatin ointment (MYCOSTATIN) Apply 1 application topically 2 (two) times daily. 10/07/18   Donnamae Jude, MD  VENTOLIN HFA 108 (90 Base) MCG/ACT inhaler INHALE 2 PUFFS BY MOUTH INTO THE LUNGS EVERY 4 HOURS AS NEEDED FOR WHEEZING OR SHORTNESS OF BREATH. 06/25/18   Donnamae Jude, MD    Allergies Mobic [meloxicam], Iodine, Augmentin [amoxicillin-pot clavulanate], Lisinopril, and Zofran [ondansetron hcl]   REVIEW OF SYSTEMS  Negative except as noted here or in the History of Present Illness.   PHYSICAL EXAMINATION  Initial Vital  Signs Blood pressure (!) 158/102, pulse 88, temperature 98.2 F (36.8 C), temperature source Oral, resp. rate 18, height $RemoveBe'5\' 6"'rnZNVCxhs$  (1.676 m), weight (!) 140.6 kg, last menstrual period 12/15/2020, SpO2 100 %.  Examination General: Well-developed, well-nourished female in no acute distress; appearance consistent with age of record HENT: normocephalic; atraumatic Eyes: Normal appearance Neck: supple Heart: regular rate and rhythm Lungs: clear to auscultation bilaterally Abdomen: soft; nondistended; nontender; bowel sounds present Back: Negative straight leg raise bilaterally Extremities: No deformity; full range of motion; trace pitting edema of both lower legs, leg circumference within 1 cm of each other Neurologic: Awake, alert and oriented; motor function intact in all extremities and symmetric;  no facial droop Skin: Warm and dry Psychiatric: Normal mood and affect   RESULTS  Summary of this visit's results, reviewed and interpreted by myself:   EKG Interpretation  Date/Time:    Ventricular Rate:    PR Interval:    QRS Duration:   QT Interval:    QTC Calculation:   R Axis:     Text Interpretation:         Laboratory Studies: No results found for this or any previous visit (from the past 24 hour(s)). Imaging Studies: No results found.  ED COURSE and MDM  Nursing notes, initial and subsequent vitals signs, including pulse oximetry, reviewed and interpreted by myself.  Vitals:   12/19/20 1855 12/19/20 2339  BP: (!) 158/102 135/79  Pulse: 88 78  Resp: 18 18  Temp: 98.2 F (36.8 C)   TempSrc: Oral   SpO2: 100% 100%  Weight: (!) 140.6 kg   Height: _0  (1.676 m)    Medications - No data to display  The patient is Wells score is -2 so I have a low suspicion of a DVT.  Given however her recent COVID diagnosis and the association of COVID with thromboembolic disease we will have her return for a Doppler ultrasound tomorrow morning.  PROCEDURES  Procedures   ED  DIAGNOSES     ICD-10-CM   1. Pain of right lower extremity  M79.604          Dailey Alberson, Jenny Reichmann, MD 12/19/20 2357

## 2020-12-20 ENCOUNTER — Ambulatory Visit (HOSPITAL_BASED_OUTPATIENT_CLINIC_OR_DEPARTMENT_OTHER)
Admission: RE | Admit: 2020-12-20 | Discharge: 2020-12-20 | Disposition: A | Discharge status: Home / Self Care | Payer: 59 | Source: Ambulatory Visit | Attending: Emergency Medicine | Admitting: Emergency Medicine

## 2020-12-20 DIAGNOSIS — M545 Low back pain, unspecified: Secondary | ICD-10-CM | POA: Diagnosis not present

## 2020-12-20 DIAGNOSIS — Z79899 Other long term (current) drug therapy: Secondary | ICD-10-CM | POA: Diagnosis not present

## 2020-12-20 DIAGNOSIS — M79604 Pain in right leg: Secondary | ICD-10-CM | POA: Insufficient documentation

## 2020-12-20 DIAGNOSIS — Z7984 Long term (current) use of oral hypoglycemic drugs: Secondary | ICD-10-CM | POA: Diagnosis not present

## 2020-12-20 DIAGNOSIS — M7989 Other specified soft tissue disorders: Secondary | ICD-10-CM | POA: Diagnosis not present

## 2020-12-20 DIAGNOSIS — J45909 Unspecified asthma, uncomplicated: Secondary | ICD-10-CM | POA: Diagnosis not present

## 2020-12-20 DIAGNOSIS — I10 Essential (primary) hypertension: Secondary | ICD-10-CM | POA: Diagnosis not present

## 2020-12-20 DIAGNOSIS — E119 Type 2 diabetes mellitus without complications: Secondary | ICD-10-CM | POA: Diagnosis not present

## 2020-12-30 ENCOUNTER — Other Ambulatory Visit: Payer: Self-pay

## 2020-12-30 ENCOUNTER — Ambulatory Visit: Payer: 59 | Admitting: Surgical

## 2020-12-30 ENCOUNTER — Ambulatory Visit (INDEPENDENT_AMBULATORY_CARE_PROVIDER_SITE_OTHER): Payer: 59

## 2020-12-30 ENCOUNTER — Other Ambulatory Visit (HOSPITAL_COMMUNITY): Payer: Self-pay

## 2020-12-30 DIAGNOSIS — M5410 Radiculopathy, site unspecified: Secondary | ICD-10-CM

## 2020-12-30 DIAGNOSIS — M545 Low back pain, unspecified: Secondary | ICD-10-CM

## 2020-12-30 MED ORDER — METHOCARBAMOL 500 MG PO TABS
500.0000 mg | ORAL_TABLET | Freq: Three times a day (TID) | ORAL | 0 refills | Status: DC | PRN
  Filled 2020-12-30: qty 30, 10d supply, fill #0

## 2020-12-30 MED ORDER — PREDNISONE 10 MG (21) PO TBPK
ORAL_TABLET | ORAL | 0 refills | Status: DC
  Filled 2020-12-30: qty 21, 6d supply, fill #0

## 2021-01-01 ENCOUNTER — Encounter: Payer: Self-pay | Admitting: Surgical

## 2021-01-01 NOTE — Progress Notes (Signed)
Office Visit Note   Patient: Mary Hanson           Date of Birth: 1975-05-21           MRN: [id number] Visit Date: 12/30/2020 Requested by: Shackleford, Lovell Sheehan, MD [address] [place],  [place] [address] PCP: Lum Keas, Lovell Sheehan, MD  Subjective: Chief Complaint  Patient presents with   Lower Back - New Patient (Initial Visit)    HPI: Mary Hanson is a 45 y.o. female who presents to the office complaining of back pain.  Patient complains of lower back pain with radicular pain down the right leg that she localizes to the buttocks with radiation down to the calf.  This all started about 3 weeks ago after she was lifting a case of 40 bottled waters.  Began as right-sided lower low back pain but the low back pain is now really resolved and is just the radicular pain that remains.  She had numbness initially but no numbness recently.  Denies any history of bowel or bladder incontinence or any weakness with the leg.  Her main complaint is pain.  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  It has been very difficult to do her job though her symptoms seem to be improving most recently over the last week.  She did think that she had a possible DVT due to the calf pain so she went to the emergency department where a ultrasound showed she is negative for DVT.  She has been taking Tylenol and occasional Motrin or Aleve for symptomatic relief.  She also had a prednisone injection in her buttock that helped for about a day..                ROS: All systems reviewed are negative as they relate to the chief complaint within the history of present illness.  Patient denies fevers or chills.  Assessment & Plan: Visit Diagnoses:  1. Low back pain, unspecified back pain laterality, unspecified chronicity, unspecified whether sciatica present   2. Radicular syndrome of right leg     Plan: Patient is a 45 year old female who presents complaining  of back pain with radicular pain.  Back pain is now resolved and just a radicular pain from her right buttock down to her right calf remains.  She has no concerning symptoms such as bowel or bladder incontinence or saddle anesthesia.  No pain with hip range of motion.  Denies any groin pain.  Radiographs are negative for any acute findings.  Impression is radiculopathy that seems to be improving.  Plan to prescribe Medrol Dosepak and muscle relaxer for symptomatic relief.  She will follow-up in 3 weeks for clinical recheck.  If she has significant improvement at that time, no need for advanced imaging but if her symptoms are persistent or worse then plan to order MRI of the lumbar spine for further evaluation of potential herniated disc.  Patient agreed with this plan.  Follow-up in 3 weeks.  Follow-Up Instructions: No follow-ups on file.   Orders:  Orders Placed This Encounter  Procedures   XR Lumbar Spine 2-3 Views   Meds ordered this encounter  Medications   methocarbamol (ROBAXIN) 500 MG tablet    Sig: Take 1 tablet (500 mg total) by mouth every 8 (eight) hours as needed for muscle spasms.    Dispense:  30 tablet    Refill:  0   predniSONE (STERAPRED UNI-PAK  21 TAB) 10 MG (21) TBPK tablet    Sig: Take as directed per package instructions    Dispense:  21 tablet    Refill:  0      Procedures: No procedures performed   Clinical Data: No additional findings.  Objective: Vital Signs: LMP 12/15/2020   Physical Exam:  Constitutional: Patient appears well-developed HEENT:  Head: Normocephalic Eyes:EOM are normal Neck: Normal range of motion Cardiovascular: Normal rate Pulmonary/chest: Effort normal Neurologic: Patient is alert Skin: Skin is warm Psychiatric: Patient has normal mood and affect  Ortho Exam: Ortho exam demonstrates tenderness throughout the axial lumbar spine and right-sided paraspinal musculature.  Negative straight leg raise bilaterally.  5/5 motor strength of  bilateral hip flexion, quadricep, hamstring, dorsiflexion, plantarflexion.  2+ patellar tendon reflexes bilaterally.  No evidence of clonus.  Negative Hoffmann sign bilaterally.  No calf tenderness.  Negative Homans' sign.  Specialty Comments:  No specialty comments available.  Imaging: No results found.   PMFS History: Patient Active Problem List   Diagnosis Date Noted   Allergic reaction 05/28/2020   Type 2 diabetes mellitus without complication, without long-term current use of insulin (HCC) 11/28/2018   Abnormal vaginal bleeding 10/07/2018   Urine frequency 09/18/2018   Vaginal irritation 09/18/2018   Moderate persistent asthma without complication 05/16/2018   Nonintractable headache 01/14/2017   Fatigue 02/10/2016   Solitary pulmonary nodule 02/10/2016   Shortness of breath 12/31/2015   Menorrhagia 05/31/2015   Peripheral neuralgia 11/11/2014   Enlarged thyroid 04/03/2013   Unspecified vitamin D deficiency 07/29/2012   External hemorrhoid 09/06/2011   Essential hypertension 12/29/2010   Morbid obesity (HCC) 12/29/2010   Allergic rhinitis 12/29/2010   Migraine headache 12/29/2010   Past Medical History:  Diagnosis Date   Allergy-induced asthma    prn inhaler   Diabetes mellitus without complication (HCC)    History of anemia    no current problem, per pt.   Hypertension    under control with med., has been on med. x 1 yr.   Migraines    Obesity    Pyogenic granuloma 12/2013   right ring finger   Seasonal allergies     Family History  Problem Relation Age of Onset   Hypertension Mother    Diabetes Father    Diabetes type II Father    Hypertension Father    Stroke Father    Goiter Sister 45       identical twin   Asthma Sister    Hypertension Sister    Stroke Maternal Grandfather    Diabetes Paternal Grandfather    Heart disease Paternal Grandfather     Past Surgical History:  Procedure Laterality Date   CERVICAL CONE BIOPSY  age 9    CHOLECYSTECTOMY  2006 or 2007   DILATION AND CURETTAGE OF UTERUS     DILATION AND EVACUATION  age 47   MASS EXCISION Right 01/16/2014   Procedure: EXCISION PYOGENIC GRANULOMA FROM RIGHT RING FINGER;  Surgeon: Dairl Ponder, MD;  Location: [facility];  Service: Orthopedics;  Laterality: Right;   Social History   Occupational History    Employer: [employer]  Tobacco Use   Smoking status: Never   Smokeless tobacco: Never  Vaping Use   Vaping Use: Never used  Substance and Sexual Activity   Alcohol use: Not Currently    Comment: Wine once a year    Drug use: No   Sexual activity: Yes    Birth control/protection: Condom, Pill  Comment: quit BCP whils on megace 7/14

## 2021-01-03 ENCOUNTER — Other Ambulatory Visit (HOSPITAL_COMMUNITY): Payer: Self-pay

## 2021-01-06 ENCOUNTER — Other Ambulatory Visit (HOSPITAL_COMMUNITY): Payer: Self-pay

## 2021-01-10 ENCOUNTER — Other Ambulatory Visit (HOSPITAL_COMMUNITY): Payer: Self-pay

## 2021-01-11 ENCOUNTER — Other Ambulatory Visit (HOSPITAL_COMMUNITY): Payer: Self-pay

## 2021-01-12 ENCOUNTER — Other Ambulatory Visit (HOSPITAL_COMMUNITY): Payer: Self-pay

## 2021-01-13 ENCOUNTER — Other Ambulatory Visit (HOSPITAL_COMMUNITY): Payer: Self-pay

## 2021-01-14 ENCOUNTER — Other Ambulatory Visit (HOSPITAL_COMMUNITY): Payer: Self-pay

## 2021-01-18 ENCOUNTER — Other Ambulatory Visit (HOSPITAL_COMMUNITY): Payer: Self-pay

## 2021-01-20 ENCOUNTER — Other Ambulatory Visit: Payer: Self-pay

## 2021-01-20 ENCOUNTER — Encounter: Payer: Self-pay | Admitting: Orthopedic Surgery

## 2021-01-20 ENCOUNTER — Ambulatory Visit: Payer: 59 | Admitting: Orthopedic Surgery

## 2021-01-20 ENCOUNTER — Ambulatory Visit: Payer: 59 | Admitting: Surgical

## 2021-01-20 DIAGNOSIS — M545 Low back pain, unspecified: Secondary | ICD-10-CM

## 2021-01-20 NOTE — Progress Notes (Signed)
Office Visit Note   Patient: Mary Hanson           Date of Birth: 29-Sep-1975           MRN: [id number] Visit Date: 01/20/2021 Requested by: Shackleford, Lovell Sheehan, MD [address] [place],  [place] [address] PCP: Lum Keas Lovell Sheehan, MD  Subjective: No chief complaint on file.   HPI: Mary Hanson is a 45 y.o. female who returns to the office for follow-up visit.  Plan from last visit was to try Medrol Dosepak and muscle relaxer.  Patient now returns with 90% improvement compared with prior appointment.  Reports her right leg pain is all but resolved.  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  She is able to sleep through the night.  She is not having to take any medication for pain at this point.  She denies any numbness or tingling down the leg.                ROS: All systems reviewed are negative as they relate to the chief complaint within the history of present illness.  Patient denies fevers or chills.  Assessment & Plan: Visit Diagnoses:  1. Low back pain, unspecified back pain laterality, unspecified chronicity, unspecified whether sciatica present     Plan: Mary Hanson is a 45 y.o. female who returns to the office for follow-up visit for low back pain with right radicular pain.  Plan from last visit was Medrol Dosepak.  They now return with 90% improvement with resolution of right leg radicular pain.  No weakness on exam today.  Negative straight leg raise.  She is doing very well.  She has returned to work without issue.  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  Plan to send her to physical therapy for designing a home exercise program and then follow-up with the office as needed if her symptoms return.  Follow-Up Instructions: No follow-ups on file.   Orders:  No orders of the defined types were placed in this encounter.  No orders  of the defined types were placed in this encounter.     Procedures: No procedures performed   Clinical Data: No additional findings.  Objective: Vital Signs: There were no vitals taken for this visit.  Physical Exam:  Constitutional: Patient appears well-developed HEENT:  Head: Normocephalic Eyes:EOM are normal Neck: Normal range of motion Cardiovascular: Normal rate Pulmonary/chest: Effort normal Neurologic: Patient is alert Skin: Skin is warm Psychiatric: Patient has normal mood and affect  Ortho Exam: Ortho exam demonstrates lumbar spine with mild tenderness to palpation over the right paraspinal musculature.  No axial tenderness.  Negative straight leg raise.  5/5 motor strength of bilateral hip flexion, quadricep, hamstring, dorsiflexion, plantarflexion.  Specialty Comments:  No specialty comments available.  Imaging: No results found.   PMFS History: Patient Active Problem List   Diagnosis Date Noted   Allergic reaction 05/28/2020   Type 2 diabetes mellitus without complication, without long-term current use of insulin (HCC) 11/28/2018   Abnormal vaginal bleeding 10/07/2018   Urine frequency 09/18/2018   Vaginal irritation 09/18/2018   Moderate persistent asthma without complication 05/16/2018   Nonintractable headache 01/14/2017   Fatigue 02/10/2016   Solitary pulmonary nodule 02/10/2016   Shortness of breath 12/31/2015   Menorrhagia 05/31/2015   Peripheral neuralgia 11/11/2014   Enlarged thyroid 04/03/2013   Unspecified  vitamin D deficiency 07/29/2012   External hemorrhoid 09/06/2011   Essential hypertension 12/29/2010   Morbid obesity (HCC) 12/29/2010   Allergic rhinitis 12/29/2010   Migraine headache 12/29/2010   Past Medical History:  Diagnosis Date   Allergy-induced asthma    prn inhaler   Diabetes mellitus without complication (HCC)    History of anemia    no current problem, per pt.   Hypertension    under control with med., has been on  med. x 1 yr.   Migraines    Obesity    Pyogenic granuloma 12/2013   right ring finger   Seasonal allergies     Family History  Problem Relation Age of Onset   Hypertension Mother    Diabetes Father    Diabetes type II Father    Hypertension Father    Stroke Father    Goiter Sister 62       identical twin   Asthma Sister    Hypertension Sister    Stroke Maternal Grandfather    Diabetes Paternal Grandfather    Heart disease Paternal Grandfather     Past Surgical History:  Procedure Laterality Date   CERVICAL CONE BIOPSY  age 27   CHOLECYSTECTOMY  2006 or 2007   DILATION AND CURETTAGE OF UTERUS     DILATION AND EVACUATION  age 23   MASS EXCISION Right 01/16/2014   Procedure: EXCISION PYOGENIC GRANULOMA FROM RIGHT RING FINGER;  Surgeon: Dairl Ponder, MD;  Location: [facility];  Service: Orthopedics;  Laterality: Right;   Social History   Occupational History    Employer: [employer]  Tobacco Use   Smoking status: Never   Smokeless tobacco: Never  Vaping Use   Vaping Use: Never used  Substance and Sexual Activity   Alcohol use: Not Currently    Comment: Wine once a year    Drug use: No   Sexual activity: Yes    Birth control/protection: Condom, Pill    Comment: quit BCP whils on megace 7/14

## 2021-01-24 ENCOUNTER — Encounter: Payer: Self-pay | Admitting: Orthopedic Surgery

## 2021-01-24 ENCOUNTER — Other Ambulatory Visit (HOSPITAL_COMMUNITY): Payer: Self-pay

## 2021-01-25 ENCOUNTER — Encounter (HOSPITAL_COMMUNITY): Payer: Self-pay | Admitting: Emergency Medicine

## 2021-01-25 ENCOUNTER — Other Ambulatory Visit (HOSPITAL_COMMUNITY): Payer: Self-pay

## 2021-01-25 ENCOUNTER — Ambulatory Visit (HOSPITAL_COMMUNITY)
Admission: EM | Admit: 2021-01-25 | Discharge: 2021-01-25 | Disposition: A | Discharge status: Home / Self Care | Payer: 59 | Attending: Family Medicine | Admitting: Family Medicine

## 2021-01-25 ENCOUNTER — Other Ambulatory Visit: Payer: Self-pay

## 2021-01-25 DIAGNOSIS — R051 Acute cough: Secondary | ICD-10-CM | POA: Diagnosis not present

## 2021-01-25 DIAGNOSIS — R0789 Other chest pain: Secondary | ICD-10-CM | POA: Insufficient documentation

## 2021-01-25 DIAGNOSIS — Z20822 Contact with and (suspected) exposure to covid-19: Secondary | ICD-10-CM | POA: Diagnosis not present

## 2021-01-25 DIAGNOSIS — J4541 Moderate persistent asthma with (acute) exacerbation: Secondary | ICD-10-CM | POA: Diagnosis not present

## 2021-01-25 DIAGNOSIS — R0602 Shortness of breath: Secondary | ICD-10-CM | POA: Diagnosis not present

## 2021-01-25 DIAGNOSIS — Z7951 Long term (current) use of inhaled steroids: Secondary | ICD-10-CM | POA: Diagnosis not present

## 2021-01-25 LAB — SARS CORONAVIRUS 2 (TAT 6-24 HRS): SARS Coronavirus 2: NEGATIVE

## 2021-01-25 MED ORDER — ALBUTEROL SULFATE HFA 108 (90 BASE) MCG/ACT IN AERS
1.0000 | INHALATION_SPRAY | Freq: Four times a day (QID) | RESPIRATORY_TRACT | 2 refills | Status: DC | PRN
  Filled 2021-01-25: qty 18, 25d supply, fill #0
  Filled 2021-08-29: qty 18, 25d supply, fill #1

## 2021-01-25 MED ORDER — PROMETHAZINE-DM 6.25-15 MG/5ML PO SYRP
5.0000 mL | ORAL_SOLUTION | Freq: Four times a day (QID) | ORAL | 0 refills | Status: DC | PRN
  Filled 2021-01-25: qty 100, 5d supply, fill #0

## 2021-01-25 MED ORDER — ADVAIR HFA 230-21 MCG/ACT IN AERO
2.0000 | INHALATION_SPRAY | Freq: Two times a day (BID) | RESPIRATORY_TRACT | 0 refills | Status: DC
  Filled 2021-01-25: qty 12, 30d supply, fill #0

## 2021-01-25 MED ORDER — PREDNISONE 20 MG PO TABS
40.0000 mg | ORAL_TABLET | Freq: Every day | ORAL | 0 refills | Status: DC
  Filled 2021-01-25: qty 10, 5d supply, fill #0

## 2021-01-25 MED ORDER — FLUTICASONE-SALMETEROL 250-50 MCG/ACT IN AEPB
1.0000 | INHALATION_SPRAY | Freq: Two times a day (BID) | RESPIRATORY_TRACT | 0 refills | Status: DC
  Filled 2021-01-25: qty 60, 30d supply, fill #0

## 2021-01-25 NOTE — ED Triage Notes (Signed)
Pt is present today with SOB, cough, and wheezing. Pt states that sx started 3-4 days ago.

## 2021-01-25 NOTE — ED Provider Notes (Signed)
[place]    CSN: [id number] Arrival date & time: 01/25/21  0806      History   Chief Complaint Chief Complaint  Patient presents with   Cough   Shortness of Breath    HPI Mary Hanson is a 45 y.o. female.   Patient presenting today with 3 to 4-day history of progressively worsening cough, chest tightness, wheezing, shortness of breath, runny nose.  She states that symptoms seem to start after the weather changed recently.  Known history of significant seasonal allergies and persistent asthma, has been out of her maintenance inhaler and almost out of albuterol at this time.  She denies fever, chills, body aches, sore throat, recent sick contacts.   Past Medical History:  Diagnosis Date   Allergy-induced asthma    prn inhaler   Diabetes mellitus without complication ([place])    History of anemia    no current problem, per pt.   Hypertension    under control with med., has been on med. x 1 yr.   Migraines    Obesity    Pyogenic granuloma 12/2013   right ring finger   Seasonal allergies     Patient Active Problem List   Diagnosis Date Noted   Allergic reaction 05/28/2020   Type 2 diabetes mellitus without complication, without long-term current use of insulin ([place]) 11/28/2018   Abnormal vaginal bleeding 10/07/2018   Urine frequency 09/18/2018   Vaginal irritation 09/18/2018   Moderate persistent asthma without complication 08/65/7846   Nonintractable headache 01/14/2017   Fatigue 02/10/2016   Solitary pulmonary nodule 02/10/2016   Shortness of breath 12/31/2015   Menorrhagia 05/31/2015   Peripheral neuralgia 11/11/2014   Enlarged thyroid 04/03/2013   Unspecified vitamin D deficiency 07/29/2012   External hemorrhoid 09/06/2011   Essential hypertension 12/29/2010   Morbid obesity ([place]) 12/29/2010   Allergic rhinitis 12/29/2010   Migraine headache 12/29/2010    Past Surgical History:  Procedure Laterality Date   CERVICAL CONE BIOPSY  age 8    CHOLECYSTECTOMY  2006 or 2007   DILATION AND CURETTAGE OF UTERUS     DILATION AND EVACUATION  age 89   MASS EXCISION Right 01/16/2014   Procedure: EXCISION PYOGENIC GRANULOMA FROM RIGHT RING FINGER;  Surgeon: Charlotte Crumb, MD;  Location: [place];  Service: Orthopedics;  Laterality: Right;    OB History     Gravida  1   Para      Term      Preterm      AB  1   Living  0      SAB      IAB  1   Ectopic      Multiple      Live Births               Home Medications    Prior to Admission medications   Medication Sig Start Date End Date Taking? Authorizing Provider  albuterol (VENTOLIN HFA) 108 (90 Base) MCG/ACT inhaler Inhale 1-2 puffs into the lungs every 6 (six) hours as needed for wheezing or shortness of breath. 01/25/21  Yes Volney American, PA-C  fluticasone-salmeterol (ADVAIR [facility]) [phone] MCG/ACT inhaler Inhale 2 puffs into the lungs 2 (two) times daily. 01/25/21  Yes Volney American, PA-C  predniSONE (DELTASONE) 20 MG tablet Take 2 tablets (40 mg total) by mouth daily with breakfast. 01/25/21  Yes Volney American, PA-C  promethazine-dextromethorphan (PROMETHAZINE-DM) 6.25-15 MG/5ML syrup Take 5 mLs by mouth 4 (  four) times daily as needed for cough. 01/25/21  Yes Volney American, PA-C  Accu-Chek FastClix Lancets MISC 1 Units by Percutaneous route 4 (four) times daily. 11/28/18   Donnamae Jude, MD  albuterol (VENTOLIN HFA) 108 (90 Base) MCG/ACT inhaler INHALE 1 PUFF EVERY 6 HOURS AS NEEDED INTO THE LUNGS 07/21/19 07/20/20  Shackleford, Vicie Mutters, MD  amLODipine (NORVASC) 10 MG tablet Take 1 tablet (10 mg total) by mouth daily. 11/28/18   Donnamae Jude, MD  amLODipine (NORVASC) 10 MG tablet TAKE 1 TABLET BY MOUTH ONCE A DAY 03/15/20 03/15/21  Shackleford, Vicie Mutters, MD  aspirin-acetaminophen-caffeine (EXCEDRIN MIGRAINE) [phone] MG tablet Take 2 tablets by mouth every 6 (six) hours as needed for headache.     [provider]  benzonatate (TESSALON) 100 MG capsule Take 1 capsule (100 mg total) by mouth every 8 (eight) hours. 10/08/20   Raspet, Derry Skill, PA-C  Blood Glucose Monitoring Suppl (ACCU-CHEK NANO SMARTVIEW) w/Device KIT 1 Units by Does not apply route as directed. Check fasting, and two hours after breakfast, lunch and dinner 11/28/18   Donnamae Jude, MD  budesonide-formoterol [facility]) 160-4.5 MCG/ACT inhaler INHALE 2 PUFFS BY MOUTH 2 TIMES DAILY 07/07/20 07/07/21  Shackleford, Vicie Mutters, MD  butalbital-acetaminophen-caffeine (FIORICET) [phone] MG tablet Take 1 tablet by mouth every 8 (eight) hours as needed for headache. 10/08/20 10/08/21  Raspet, Derry Skill, PA-C  COVID-19 At Home Antigen Test [facility] COVID-19 HOME TEST) KIT Use as directed 12/02/20   Edmon Crape, RPH  eletriptan (RELPAX) 40 MG tablet TAKE 1 TABLET BY MOUTH AS NEEDED FOR MIGRAINE OR HEADACHE. MAY REPEAT IN 2 HOURS IF HEADACHE PERSISTS OR RECURS. 02/24/19   Donnamae Jude, MD  EPINEPHrine 0.3 mg/0.3 mL IJ SOAJ injection INJECT AS DIRECTED AS NEEDED FOR ANAPHYLAXIS 05/28/20 05/28/21  Meccariello, Bernita Raisin, DO  fexofenadine (ALLEGRA ALLERGY) 180 MG tablet Take 1 tablet (180 mg total) by mouth daily. 01/14/19   Myles Gip, DO  Fexofenadine-Pseudoephedrine (ALLEGRA-D PO) Take by mouth.    [provider]  fluticasone (FLONASE) 50 MCG/ACT nasal spray Place 2 sprays into both nostrils daily for 10 days. 05/16/18 05/26/18  Glenis Smoker, MD  fluticasone (FLONASE) 50 MCG/ACT nasal spray Place 2 sprays into both nostrils daily. 08/25/20   Donnamae Jude, MD  glucose blood (ACCU-CHEK SMARTVIEW) test strip Check blood sugars 4x/daily 11/28/18   Donnamae Jude, MD  hydrochlorothiazide (HYDRODIURIL) 25 MG tablet Take 1 tablet (25 mg total) by mouth daily. 08/25/20   Donnamae Jude, MD  ibuprofen (ADVIL,MOTRIN) 200 MG tablet Take 800 mg by mouth every 6 (six) hours as needed for headache.    [provider]   megestrol (MEGACE) 40 MG tablet Take 1 tablet (40 mg total) by mouth 2 (two) times daily. 10/07/18   Donnamae Jude, MD  metFORMIN (GLUCOPHAGE) 500 MG tablet Take 1 tablet (500 mg total) by mouth 2 (two) times daily with a meal. 11/28/18   Donnamae Jude, MD  methocarbamol (ROBAXIN) 500 MG tablet Take 1 tablet (500 mg total) by mouth every 8 (eight) hours as needed for muscle spasms. 12/30/20   Magnant, Charles L, PA-C  montelukast (SINGULAIR) 10 MG tablet Take 1 tablet (10 mg total) by mouth at bedtime for 30 days. 05/16/18 10/08/20  Glenis Smoker, MD  nystatin ointment (MYCOSTATIN) Apply 1 application topically 2 (two) times daily. 10/07/18   Donnamae Jude, MD  predniSONE (STERAPRED UNI-PAK 21 TAB) 10 MG (  21) TBPK tablet Take as directed per package instructions 12/30/20   Magnant, Charles L, PA-C  VENTOLIN HFA 108 (90 Base) MCG/ACT inhaler INHALE 2 PUFFS BY MOUTH INTO THE LUNGS EVERY 4 HOURS AS NEEDED FOR WHEEZING OR SHORTNESS OF BREATH. 06/25/18   Donnamae Jude, MD    Family History Family History  Problem Relation Age of Onset   Hypertension Mother    Diabetes Father    Diabetes type II Father    Hypertension Father    Stroke Father    Goiter Sister 92       identical twin   Asthma Sister    Hypertension Sister    Stroke Maternal Grandfather    Diabetes Paternal Grandfather    Heart disease Paternal Grandfather     Social History Social History   Tobacco Use   Smoking status: Never   Smokeless tobacco: Never  Vaping Use   Vaping Use: Never used  Substance Use Topics   Alcohol use: Not Currently    Comment: Wine once a year    Drug use: No     Allergies   Mobic [meloxicam], Iodine, Augmentin [amoxicillin-pot clavulanate], Lisinopril, and Zofran [ondansetron hcl]   Review of Systems Review of Systems Per HPI  Physical Exam Triage Vital Signs ED Triage Vitals  Enc Vitals Group     BP 01/25/21 0827 136/86     Pulse Rate 01/25/21 0827 (!) 8     Resp 01/25/21  0827 19     Temp 01/25/21 0827 97.6 F (36.4 C)     Temp src --      SpO2 01/25/21 0827 96 %     Weight --      Height --      Head Circumference --      Peak Flow --      Pain Score 01/25/21 0824 0     Pain Loc --      Pain Edu? --      Excl. in [place]? --    No data found.  Updated Vital Signs BP 136/86   Pulse 75   Temp 97.6 F (36.4 C)   Resp 19   SpO2 96%   Visual Acuity Right Eye Distance:   Left Eye Distance:   Bilateral Distance:    Right Eye Near:   Left Eye Near:    Bilateral Near:     Physical Exam Vitals and nursing note reviewed.  Constitutional:      Appearance: Normal appearance. She is not ill-appearing.  HENT:     Head: Atraumatic.     Right Ear: Tympanic membrane normal.     Left Ear: Tympanic membrane normal.     Nose: Rhinorrhea present.     Mouth/Throat:     Mouth: Mucous membranes are moist.     Pharynx: Posterior oropharyngeal erythema present.  Eyes:     Extraocular Movements: Extraocular movements intact.     Conjunctiva/sclera: Conjunctivae normal.  Cardiovascular:     Rate and Rhythm: Normal rate and regular rhythm.     Heart sounds: Normal heart sounds.  Pulmonary:     Effort: Pulmonary effort is normal.     Breath sounds: Wheezing present. No rales.     Comments: Moderate diffuse wheezes bilaterally.  Breathing comfortably on room air, speaking in full sentences Abdominal:     General: Bowel sounds are normal. There is no distension.     Palpations: Abdomen is soft.     Tenderness: There is no  abdominal tenderness. There is no guarding.  Musculoskeletal:        General: Normal range of motion.     Cervical back: Normal range of motion and neck supple.  Skin:    General: Skin is warm and dry.  Neurological:     Mental Status: She is alert and oriented to person, place, and time.  Psychiatric:        Mood and Affect: Mood normal.        Thought Content: Thought content normal.        Judgment: Judgment normal.     UC  Treatments / Results  Labs (all labs ordered are listed, but only abnormal results are displayed) Labs Reviewed  SARS CORONAVIRUS 2 (TAT 6-24 HRS)    EKG   Radiology No results found.  Procedures Procedures (including critical care time)  Medications Ordered in UC Medications - No data to display  Initial Impression / Assessment and Plan / UC Course  I have reviewed the triage vital signs and the nursing notes.  Pertinent labs & imaging results that were available during my care of the patient were reviewed by me and considered in my medical decision making (see chart for details).     Suspect asthma exacerbation to be causing her symptoms, will treat with prednisone burst, refill maintenance inhaler and albuterol inhaler and give Phenergan DM for cough.  Continue Mucinex and other supportive over-the-counter measures.  COVID PCR pending for rule out but low suspicion given symptoms.  Work note given with quarantine protocol.  Final Clinical Impressions(s) / UC Diagnoses   Final diagnoses:  Moderate persistent asthma with exacerbation  Shortness of breath  Acute cough   Discharge Instructions   None    ED Prescriptions     Medication Sig Dispense Auth. Provider   fluticasone-salmeterol (ADVAIR HFA) 230-21 MCG/ACT inhaler Inhale 2 puffs into the lungs 2 (two) times daily. 12 g Volney American, [place]   albuterol (VENTOLIN HFA) 108 (90 Base) MCG/ACT inhaler Inhale 1-2 puffs into the lungs every 6 (six) hours as needed for wheezing or shortness of breath. 18 g Volney American, [place]   predniSONE (DELTASONE) 20 MG tablet Take 2 tablets (40 mg total) by mouth daily with breakfast. 10 tablet Volney American, PA-C   promethazine-dextromethorphan (PROMETHAZINE-DM) 6.25-15 MG/5ML syrup Take 5 mLs by mouth 4 (four) times daily as needed for cough. 100 mL Volney American, [place]      PDMP not reviewed this encounter.   Merrie Roof [place],  [place] 01/25/21 [phone]

## 2021-01-27 ENCOUNTER — Encounter: Payer: Self-pay | Admitting: Physical Therapy

## 2021-01-27 ENCOUNTER — Ambulatory Visit (INDEPENDENT_AMBULATORY_CARE_PROVIDER_SITE_OTHER): Payer: 59 | Admitting: Physical Therapy

## 2021-01-27 ENCOUNTER — Other Ambulatory Visit: Payer: Self-pay

## 2021-01-27 DIAGNOSIS — M5441 Lumbago with sciatica, right side: Secondary | ICD-10-CM | POA: Diagnosis not present

## 2021-01-27 DIAGNOSIS — G8929 Other chronic pain: Secondary | ICD-10-CM | POA: Diagnosis not present

## 2021-01-27 DIAGNOSIS — R293 Abnormal posture: Secondary | ICD-10-CM

## 2021-01-27 DIAGNOSIS — M6281 Muscle weakness (generalized): Secondary | ICD-10-CM | POA: Diagnosis not present

## 2021-01-27 NOTE — Therapy (Signed)
[facility] Physical Therapy [address] [place], [place], [address] Phone: [phone]   Fax:  [phone]  Physical Therapy Evaluation  Patient Details  Name: Mary Hanson MRN: [id number] Date of Birth: Aug 11, 1975 Referring Provider (PT): August Saucer, MD   Encounter Date: 01/27/2021   PT End of Session - 01/27/21 1114     Visit Number 1    Number of Visits 5    Date for PT Re-Evaluation 03/10/21    Authorization Type [facility] [employer]    PT Start Time 0933    PT Stop Time 1019    PT Time Calculation (min) 46 min    Activity Tolerance Patient tolerated treatment well    Behavior During Therapy [facility] for tasks assessed/performed             Past Medical History:  Diagnosis Date   Allergy-induced asthma    prn inhaler   Diabetes mellitus without complication (HCC)    History of anemia    no current problem, per pt.   Hypertension    under control with med., has been on med. x 1 yr.   Migraines    Obesity    Pyogenic granuloma 12/2013   right ring finger   Seasonal allergies     Past Surgical History:  Procedure Laterality Date   CERVICAL CONE BIOPSY  age 11   CHOLECYSTECTOMY  2006 or 2007   DILATION AND CURETTAGE OF UTERUS     DILATION AND EVACUATION  age 46   MASS EXCISION Right 01/16/2014   Procedure: EXCISION PYOGENIC GRANULOMA FROM RIGHT RING FINGER;  Surgeon: Dairl Ponder, MD;  Location: [facility];  Service: Orthopedics;  Laterality: Right;    There were no vitals filed for this visit.    Subjective Assessment - 01/27/21 0939     Subjective LBP and has some relief from Medrol dosepak an muscle relaxer. Of note she did go to ED 01/25/21 for cough and [facility] with suspected ashtma exacerbation, had prednisone for this which helped her back pain as well. Covid test was negative. 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 Then on her days off she sits a lot doing schoolwork so  this also bothers her back. She was having sciatica on Rt but this has now resolved with prednisone    Diagnostic tests She had U.S. to right leg that was negative for DVT. Lumbar XR reveal "loss of lordosis, early degenerative changes, most notably with osteophytic lipping of the endplates at L3-L4.  No fracture noted.  No spondylolisthesis.    Patient Stated Goals learn body mechanics and HEP    Currently in Pain? Yes    Pain Score 3     Pain Location Back    Pain Orientation Right    Pain Descriptors / Indicators Aching    Pain Type Acute pain   acute on chronic   Pain Radiating Towards denis radiculpathy today but has had this in past down Rt leg    Pain Onset More than a month ago    Pain Frequency Intermittent    Aggravating Factors  lifting and transfering patients. Then on her days off she sits a lot doing schoolwork so this also bothers her back    Pain Relieving Factors meds, heat    Multiple Pain Sites No                [facility] PT Assessment - 01/27/21 0001  Assessment   Medical Diagnosis acute on chronic LBP    Referring Provider (PT) August Saucer, MD    Onset Date/Surgical Date --   Pain for about a year, worse in last 3 months   Next MD Visit nothing scheduled    Prior Therapy none      Precautions   Precautions None      Balance Screen   Has the patient fallen in the past 6 months No    Has the patient had a decrease in activity level because of a fear of falling?  No    Is the patient reluctant to leave their home because of a fear of falling?  No      Home Tourist information centre manager residence      Prior Function   Level of Independence Independent    Vocation Full time employment    Vocation Requirements cardiac RN      Cognition   Overall Cognitive Status Within Functional Limits for tasks assessed      Observation/Other Assessments   Focus on Therapeutic Outcomes (FOTO)  67% functional, goal 78%      Posture/Postural Control    Posture Comments Rt lateral trunk shift      ROM / Strength   AROM / PROM / Strength AROM;Strength      AROM   Overall AROM Comments lumbar AROM WNL      Strength   Overall Strength Comments 5/5 knee strength bilat, hip strength grossly 4+ on Rt and 5 on Lt all tested in sitting      Flexibility   Soft Tissue Assessment /Muscle Length --   very tight glutes, mod tight hamstrings     Palpation   Palpation comment TTP Rt SIJ area, Rt lumbar P.S, and glutes with trigger points noted      Special Tests   Other special tests mildly + slump test on Rt, negatve SLR test                        Objective measurements completed on examination: See above findings.       [facility] Adult PT Treatment/Exercise - 01/27/21 0001       Exercises   Exercises Lumbar      Lumbar Exercises: Stretches   Single Knee to Chest Stretch Right;1 rep;20 seconds    Piriformis Stretch Right;1 rep;20 seconds    Other Lumbar Stretch Exercise lateral shift correction with Rt shoulder against wall X10, standing lumbar extensions X10                     PT Education - 01/27/21 1152     Education Details HEP,POC, body and lifitng mechanics, walking program recommendation, lumbar roll for sitting    Person(s) Educated Patient    Methods Explanation;Demonstration;Verbal cues;Handout    Comprehension Verbalized understanding;Need further instruction                 PT Long Term Goals - 01/27/21 1152       PT LONG TERM GOAL #1   Title Pt will be I and compliant with HEP and verbalize plan for continued exercise post PT discharge. (Target goal for all goals 6 weeks 03/10/21)    Target Date 03/10/21      PT LONG TERM GOAL #2   Title Pt will improve Lt hip strength to 5/5 MMT grossly tested in sitting to improve functional strength    Status New  PT LONG TERM GOAL #3   Title Pt will improve FOTO to 78% functional score    Status New      PT LONG TERM GOAL #4    Title Pt will reduce pain to overall no more than 2/10 with ususal activity and work.    Status New                    Plan - 01/27/21 1118     Clinical Impression Statement Pt presents with acute on chronic LBP. 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 She will benefit from skilled PT to address her deficits.    Personal Factors and Comorbidities Comorbidity 3+    Comorbidities PMH: DM,asthma and SHOB,obesity,migranes,HTN    Examination-Activity Limitations Bend;Carry;Lift;Squat;Sleep;Locomotion Level    Examination-Participation Restrictions Cleaning;Community Activity;Occupation;School    Stability/Clinical Decision Making Stable/Uncomplicated    Clinical Decision Making Low    Rehab Potential Good    PT Frequency 1x / week    PT Duration 6 weeks    PT Treatment/Interventions Cryotherapy;Electrical Stimulation;Iontophoresis 4mg /ml Dexamethasone;Moist Heat;Traction;Ultrasound;Therapeutic activities;Therapeutic exercise;Neuromuscular re-education;Manual techniques;Dry needling;Passive range of motion;Taping;Spinal Manipulations;Joint Manipulations    PT Next Visit Plan review and update HEP PRN, consider DN or modalaties, needs lumbar/hip strength, core,    PT Home Exercise Plan Access Code:    Consulted and Agree with Plan of Care Patient             Patient will benefit from skilled therapeutic intervention in order to improve the following deficits and impairments:  Decreased activity tolerance, Decreased range of motion, Decreased strength, Difficulty walking, Postural dysfunction, Pain  Visit Diagnosis: Chronic right-sided low back pain with  right-sided sciatica  Muscle weakness (generalized)  Abnormal posture     Problem List Patient Active Problem List   Diagnosis Date Noted   Allergic reaction 05/28/2020   Type 2 diabetes mellitus without complication, without long-term current use of insulin (HCC) 11/28/2018   Abnormal vaginal bleeding 10/07/2018   Urine frequency 09/18/2018   Vaginal irritation 09/18/2018   Moderate persistent asthma without complication 05/16/2018   Nonintractable headache 01/14/2017   Fatigue 02/10/2016   Solitary pulmonary nodule 02/10/2016   Shortness of breath 12/31/2015   Menorrhagia 05/31/2015   Peripheral neuralgia 11/11/2014   Enlarged thyroid 04/03/2013   Unspecified vitamin D deficiency 07/29/2012   External hemorrhoid 09/06/2011   Essential hypertension 12/29/2010   Morbid obesity (HCC) 12/29/2010   Allergic rhinitis 12/29/2010   Migraine headache 12/29/2010    02/28/2011, PT,DPT 01/27/2021, 11:58 AM  [facility] Physical Therapy [address] [place], [place], [place] Phone: [phone]   Fax:  [phone]  Name: Mary Hanson MRN: Mary Hanson Date of Birth: [birth year]

## 2021-01-27 NOTE — Addendum Note (Signed)
Addended by: Ivery Quale R on: 01/27/2021 11:59 AM   Modules accepted: Orders

## 2021-01-27 NOTE — Patient Instructions (Signed)
Access Code: [email or web address] URL: [email or web address] Date: 01/27/2021 Prepared by: Ivery Quale  Exercises Lateral Shift Correction at Wall - 2 x daily - 6 x weekly - 2-3 sets - 10 reps Standing Lumbar Extension - 2 x daily - 6 x weekly - 1-2 sets - 10 reps Supine Bridge - 2 x daily - 6 x weekly - 2 sets - 10 reps - 5 hold Hooklying Single Knee to Chest Stretch - 2 x daily - 6 x weekly - 1 sets - 2-3 reps - 20 hold Supine 90/90 Alternating Toe Touch - 2 x daily - 6 x weekly - 1 sets - 15-20 reps Seated Piriformis Stretch - 2 x daily - 6 x weekly - 1 sets - 2 reps - 30 hold Slump Stretch - 2 x daily - 6 x weekly - 1-2 sets - 10 reps - 3 hold

## 2021-02-04 ENCOUNTER — Other Ambulatory Visit (HOSPITAL_COMMUNITY): Payer: Self-pay

## 2021-02-07 ENCOUNTER — Other Ambulatory Visit (HOSPITAL_COMMUNITY): Payer: Self-pay

## 2021-02-09 ENCOUNTER — Other Ambulatory Visit (HOSPITAL_COMMUNITY): Payer: Self-pay

## 2021-02-09 MED ORDER — AMLODIPINE BESYLATE 10 MG PO TABS
10.0000 mg | ORAL_TABLET | Freq: Every day | ORAL | 6 refills | Status: DC
  Filled 2021-02-09: qty 30, 30d supply, fill #0
  Filled 2021-03-15: qty 30, 30d supply, fill #1
  Filled 2021-05-01: qty 30, 30d supply, fill #2
  Filled 2021-06-21 – 2021-07-12 (×2): qty 30, 30d supply, fill #3
  Filled 2021-08-25: qty 90, 90d supply, fill #4

## 2021-02-09 MED ORDER — ALBUTEROL SULFATE HFA 108 (90 BASE) MCG/ACT IN AERS
INHALATION_SPRAY | RESPIRATORY_TRACT | 6 refills | Status: DC
  Filled 2021-02-09 – 2021-03-15 (×2): qty 18, 16d supply, fill #0
  Filled 2021-04-26: qty 18, 16d supply, fill #1
  Filled 2021-06-21 – 2021-07-12 (×2): qty 18, 16d supply, fill #2

## 2021-02-11 ENCOUNTER — Other Ambulatory Visit: Payer: Self-pay

## 2021-02-11 ENCOUNTER — Ambulatory Visit: Payer: 59 | Admitting: Physical Therapy

## 2021-02-11 DIAGNOSIS — G8929 Other chronic pain: Secondary | ICD-10-CM

## 2021-02-11 DIAGNOSIS — R293 Abnormal posture: Secondary | ICD-10-CM

## 2021-02-11 DIAGNOSIS — M5441 Lumbago with sciatica, right side: Secondary | ICD-10-CM

## 2021-02-11 DIAGNOSIS — M6281 Muscle weakness (generalized): Secondary | ICD-10-CM

## 2021-02-11 NOTE — Therapy (Signed)
[facility] Physical Therapy [address] [place], [place], [address] Phone: [phone]   Fax:  [phone]  Physical Therapy Treatment  Patient Details  Name: Mary Hanson MRN: [id number] Date of Birth: 04/11/1976 Referring Provider (PT): August Saucer, MD   Encounter Date: 02/11/2021   PT End of Session - 02/11/21 1015     Visit Number 2    Number of Visits 5    Date for PT Re-Evaluation 03/10/21    Authorization Type [facility] [employer]    PT Start Time 0934    PT Stop Time 1015    PT Time Calculation (min) 41 min    Activity Tolerance Patient tolerated treatment well    Behavior During Therapy [facility] for tasks assessed/performed             Past Medical History:  Diagnosis Date   Allergy-induced asthma    prn inhaler   Diabetes mellitus without complication (HCC)    History of anemia    no current problem, per pt.   Hypertension    under control with med., has been on med. x 1 yr.   Migraines    Obesity    Pyogenic granuloma 12/2013   right ring finger   Seasonal allergies     Past Surgical History:  Procedure Laterality Date   CERVICAL CONE BIOPSY  age 21   CHOLECYSTECTOMY  2006 or 2007   DILATION AND CURETTAGE OF UTERUS     DILATION AND EVACUATION  age 21   MASS EXCISION Right 01/16/2014   Procedure: EXCISION PYOGENIC GRANULOMA FROM RIGHT RING FINGER;  Surgeon: Dairl Ponder, MD;  Location: [facility];  Service: Orthopedics;  Laterality: Right;    There were no vitals filed for this visit.   Subjective Assessment - 02/11/21 0942     Subjective LBP has improved a lot since last visit. 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    Diagnostic tests She had U.S. to right leg that was negative for DVT. Lumbar XR reveal "loss of lordosis, early degenerative changes, most notably with osteophytic lipping of the endplates at L3-L4.  No  fracture noted.  No spondylolisthesis.    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 verbal cues and demo for proper body mechanics and had her lift X 10 with feet even at shoulder width apart and 5 reps ea with a staggard stance      Lumbar Exercises: Aerobic   Nustep L6 X 8 min LE/UE      Lumbar Exercises: Machines for Strengthening   Leg Press 125# DL [id number], 33# SL I95 ea side      Lumbar Exercises: Standing   Functional Squats Limitations goblet squat to chair with 5# 2X10 (2 min rest in between due to [facility])    Row Both;20 reps    Theraband Level (Row) Level 3 (Green)    Shoulder Extension Both;20 reps    Theraband Level (Shoulder Extension) Level 3 (Green)  Other Standing Lumbar Exercises step ups 6 inch X 10 bilat with one UE support                          PT Long Term Goals - 01/27/21 1152       PT LONG TERM GOAL #1   Title Pt will be I and compliant with HEP and verbalize plan for continued exercise post PT discharge. (Target goal for all goals 6 weeks 03/10/21)    Target Date 03/10/21      PT LONG TERM GOAL #2   Title Pt will improve Lt hip strength to 5/5 MMT grossly tested in sitting to improve functional strength    Status New      PT LONG TERM GOAL #3   Title Pt will improve FOTO to 78% functional score    Status New      PT LONG TERM GOAL #4   Title Pt will reduce pain to overall no more than 2/10 with ususal activity and work.    Status New                   Plan - 02/11/21 1039     Clinical Impression Statement She is doing much better overall with pain despite her slipping in the shower. 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 She has responded well to HEP and had improved overall posture with less lateral trunk shift. Continue POC    Personal Factors and Comorbidities Comorbidity 3+    Comorbidities PMH: DM,asthma and SHOB,obesity,migranes,HTN    Examination-Activity Limitations Bend;Carry;Lift;Squat;Sleep;Locomotion Level    Examination-Participation Restrictions Cleaning;Community Activity;Occupation;School    Stability/Clinical Decision Making Stable/Uncomplicated    Rehab Potential Good    PT Frequency 1x / week    PT Duration 6 weeks    PT Treatment/Interventions Cryotherapy;Electrical Stimulation;Iontophoresis 4mg /ml Dexamethasone;Moist Heat;Traction;Ultrasound;Therapeutic activities;Therapeutic exercise;Neuromuscular re-education;Manual techniques;Dry needling;Passive range of motion;Taping;Spinal Manipulations;Joint Manipulations    PT Next Visit Plan functional lifitng, needs lumbar/hip strength, core,    PT Home Exercise Plan Access Code: , added squats with chair touch    Consulted and Agree with Plan of Care Patient             Patient will benefit from skilled therapeutic intervention in order to improve the following deficits and impairments:  Decreased activity tolerance, Decreased range of motion, Decreased strength, Difficulty walking, Postural dysfunction, Pain  Visit Diagnosis: Chronic right-sided low back pain with right-sided sciatica  Muscle weakness (generalized)  Abnormal posture     Problem List Patient Active Problem List   Diagnosis Date Noted   Allergic reaction 05/28/2020   Type 2 diabetes mellitus without complication, without long-term current use of insulin (HCC) 11/28/2018   Abnormal vaginal bleeding 10/07/2018   Urine frequency 09/18/2018   Vaginal irritation 09/18/2018   Moderate persistent asthma without complication 05/16/2018   Nonintractable headache 01/14/2017    Fatigue 02/10/2016   Solitary pulmonary nodule 02/10/2016   Shortness of breath 12/31/2015   Menorrhagia 05/31/2015   Peripheral neuralgia 11/11/2014   Enlarged thyroid 04/03/2013   Unspecified vitamin D deficiency 07/29/2012   External hemorrhoid 09/06/2011   Essential hypertension 12/29/2010   Morbid obesity (HCC) 12/29/2010   Allergic rhinitis 12/29/2010   Migraine headache 12/29/2010    02/28/2011, PT,DPT 02/11/2021, 10:42 AM  [facility] Physical Therapy [address] [place], [place], [place] Phone: [phone]  Fax:  [phone]  Name: Mary Hanson MRN: [id number] Date of Birth: 10/31/1975

## 2021-02-14 ENCOUNTER — Other Ambulatory Visit (HOSPITAL_COMMUNITY): Payer: Self-pay

## 2021-02-18 ENCOUNTER — Encounter: Payer: 59 | Admitting: Physical Therapy

## 2021-02-23 ENCOUNTER — Other Ambulatory Visit (HOSPITAL_COMMUNITY): Payer: Self-pay

## 2021-02-25 ENCOUNTER — Ambulatory Visit: Payer: 59 | Admitting: Physical Therapy

## 2021-02-25 ENCOUNTER — Other Ambulatory Visit: Payer: Self-pay

## 2021-02-25 DIAGNOSIS — R293 Abnormal posture: Secondary | ICD-10-CM

## 2021-02-25 DIAGNOSIS — M6281 Muscle weakness (generalized): Secondary | ICD-10-CM | POA: Diagnosis not present

## 2021-02-25 DIAGNOSIS — G8929 Other chronic pain: Secondary | ICD-10-CM

## 2021-02-25 DIAGNOSIS — M5441 Lumbago with sciatica, right side: Secondary | ICD-10-CM | POA: Diagnosis not present

## 2021-02-25 NOTE — Therapy (Addendum)
[facility] Physical Therapy [address] [place], [place], [address] Phone: [phone]   Fax:  [phone]  Physical Therapy Treatment/Discharge addendum PHYSICAL THERAPY DISCHARGE SUMMARY  Visits from Start of Care: 3  Current functional level related to goals / functional outcomes: See below   Remaining deficits: See below   Education / Equipment: HEP Plan: Patient goals were partially met. Patient is being discharged due to not returning since last PT visit.        Patient Details  Name: Mary Hanson MRN: [id number] Date of Birth: Apr 24, 1976 Referring Provider (PT): Marlou Sa, MD   Encounter Date: 02/25/2021   PT End of Session - 02/25/21 1014     Visit Number 3    Number of Visits 5    Date for PT Re-Evaluation 03/10/21    Authorization Type [facility] [employer]    PT Start Time 0935    PT Stop Time 1015    PT Time Calculation (min) 40 min    Activity Tolerance Patient tolerated treatment well    Behavior During Therapy [facility] for tasks assessed/performed             Past Medical History:  Diagnosis Date   Allergy-induced asthma    prn inhaler   Diabetes mellitus without complication ([place])    History of anemia    no current problem, per pt.   Hypertension    under control with med., has been on med. x 1 yr.   Migraines    Obesity    Pyogenic granuloma 12/2013   right ring finger   Seasonal allergies     Past Surgical History:  Procedure Laterality Date   CERVICAL CONE BIOPSY  age 53   CHOLECYSTECTOMY  2006 or 2007   DILATION AND CURETTAGE OF UTERUS     DILATION AND EVACUATION  age 16   MASS EXCISION Right 01/16/2014   Procedure: EXCISION PYOGENIC GRANULOMA FROM RIGHT RING FINGER;  Surgeon: Charlotte Crumb, MD;  Location: [place];  Service: Orthopedics;  Laterality: Right;    There were no vitals filed for this visit.   Subjective Assessment - 02/25/21 1012     Subjective LBP has improved a lot. She did not have  back pain after last session but had lots of leg pain/soreness    Diagnostic tests She had U.S. to right leg that was negative for DVT. Lumbar XR reveal "loss of lordosis, early degenerative changes, most notably with osteophytic lipping of the endplates at W2-O3.  No fracture noted.  No spondylolisthesis.    Patient Stated Goals learn body mechanics and HEP    Pain Onset More than a month ago                               [facility] Adult PT Treatment/Exercise - 02/25/21 0001       Lumbar Exercises: Stretches   Lower Trunk Rotation 5 reps;10 seconds    Pelvic Tilt 20 reps    Piriformis Stretch Right;Left;2 reps;30 seconds      Lumbar Exercises: Aerobic   Nustep L6 X 10 min LE/UE      Lumbar Exercises: Machines for Strengthening   Cybex Knee Extension 10# DL 2X15    Cybex Knee Flexion 35# DL 2X15    Leg Press 125# DL 2X15, 75# SL X15 ea side    Other Lumbar Machine Exercise row machine 20# 2X15      Lumbar Exercises: Supine  AB Set Limitations in supine with pball press downs 5 sec X10, then isometric hip flexion into ball 5 sec X 5 bilat    Bridge 15 reps;5 seconds                          PT Long Term Goals - 01/27/21 1152       PT LONG TERM GOAL #1   Title Pt will be I and compliant with HEP and verbalize plan for continued exercise post PT discharge. (Target goal for all goals 6 weeks 03/10/21)    Target Date 03/10/21      PT LONG TERM GOAL #2   Title Pt will improve Lt hip strength to 5/5 MMT grossly tested in sitting to improve functional strength    Status New      PT LONG TERM GOAL #3   Title Pt will improve FOTO to 78% functional score    Status New      PT LONG TERM GOAL #4   Title Pt will reduce pain to overall no more than 2/10 with ususal activity and work.    Status New                   Plan - 02/25/21 1024     Clinical Impression Statement She is no longer having much back pain overall, she was overly sore  last visit in her legs so we backed off of some of her leg strengthening and instead perfomed more weight machines today which she can use at the gym to maintain her strength when she finishes up with PT. 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 She has one more visit scheduled next week and may be ready to finish up with PT if she remains pain free but she is welcome to set up additional appointments if she feels she needs to.    Personal Factors and Comorbidities Comorbidity 3+    Comorbidities PMH: DM,asthma and SHOB,obesity,migranes,HTN    Examination-Activity Limitations Bend;Carry;Lift;Squat;Sleep;Locomotion Level    Examination-Participation Restrictions Cleaning;Community Activity;Occupation;School    Stability/Clinical Decision Making Stable/Uncomplicated    Rehab Potential Good    PT Frequency 1x / week    PT Duration 6 weeks    PT Treatment/Interventions Cryotherapy;Electrical Stimulation;Iontophoresis 4mg /ml Dexamethasone;Moist Heat;Traction;Ultrasound;Therapeutic activities;Therapeutic exercise;Neuromuscular re-education;Manual techniques;Dry needling;Passive range of motion;Taping;Spinal Manipulations;Joint Manipulations    PT Next Visit Plan needs lumbar/hip strength, core, may be ready to discharge?    PT Home Exercise Plan Access Code: [email or web address], added squats with chair touch    Consulted and Agree with Plan of Care Patient             Patient will benefit from skilled therapeutic intervention in order to improve the following deficits and impairments:  Decreased activity tolerance, Decreased range of motion, Decreased strength, Difficulty walking, Postural dysfunction, Pain  Visit Diagnosis: Chronic right-sided low back pain with right-sided sciatica  Muscle weakness (generalized)  Abnormal posture     Problem List Patient Active Problem List   Diagnosis  Date Noted   Allergic reaction 05/28/2020   Type 2 diabetes mellitus without complication, without long-term current use of insulin ([place]) 11/28/2018   Abnormal vaginal bleeding 10/07/2018   Urine frequency 09/18/2018   Vaginal irritation 09/18/2018   Moderate persistent asthma without complication 15/52/0802   Nonintractable headache 01/14/2017   Fatigue 02/10/2016  Solitary pulmonary nodule 02/10/2016   Shortness of breath 12/31/2015   Menorrhagia 05/31/2015   Peripheral neuralgia 11/11/2014   Enlarged thyroid 04/03/2013   Unspecified vitamin D deficiency 07/29/2012   External hemorrhoid 09/06/2011   Essential hypertension 12/29/2010   Morbid obesity ([place]) 12/29/2010   Allergic rhinitis 12/29/2010   Migraine headache 12/29/2010    Debbe Odea, PT,DPT 02/25/2021, 10:36 AM  [facility] Physical Therapy [address] [place], [place], [address] Phone: [phone]   Fax:  [phone]  Name: Mary Hanson MRN: [id number] Date of Birth: 12/02/1975

## 2021-03-04 ENCOUNTER — Encounter: Payer: 59 | Admitting: Rehabilitative and Restorative Service Providers"

## 2021-03-04 ENCOUNTER — Other Ambulatory Visit (HOSPITAL_COMMUNITY): Payer: Self-pay

## 2021-03-04 MED ORDER — AMOXICILLIN-POT CLAVULANATE 875-125 MG PO TABS
ORAL_TABLET | ORAL | 0 refills | Status: DC
  Filled 2021-03-04: qty 20, 10d supply, fill #0

## 2021-03-04 MED ORDER — PREDNISONE 10 MG (21) PO TBPK
ORAL_TABLET | ORAL | 0 refills | Status: DC
  Filled 2021-03-04: qty 21, 6d supply, fill #0

## 2021-03-15 ENCOUNTER — Other Ambulatory Visit (HOSPITAL_COMMUNITY): Payer: Self-pay

## 2021-03-15 ENCOUNTER — Other Ambulatory Visit: Payer: Self-pay | Admitting: Family Medicine

## 2021-03-15 MED ORDER — PREDNISONE 20 MG PO TABS
ORAL_TABLET | ORAL | 0 refills | Status: DC
  Filled 2021-03-15: qty 21, 7d supply, fill #0

## 2021-03-15 NOTE — Telephone Encounter (Signed)
Provider not at practice

## 2021-03-16 ENCOUNTER — Other Ambulatory Visit: Payer: Self-pay

## 2021-03-16 ENCOUNTER — Other Ambulatory Visit: Payer: Self-pay | Admitting: Family Medicine

## 2021-03-16 ENCOUNTER — Ambulatory Visit
Admission: RE | Admit: 2021-03-16 | Discharge: 2021-03-16 | Disposition: A | Discharge status: Home / Self Care | Payer: 59 | Source: Ambulatory Visit | Attending: Family Medicine | Admitting: Family Medicine

## 2021-03-16 ENCOUNTER — Other Ambulatory Visit (HOSPITAL_COMMUNITY): Payer: Self-pay

## 2021-03-16 DIAGNOSIS — R0602 Shortness of breath: Secondary | ICD-10-CM | POA: Diagnosis not present

## 2021-03-16 DIAGNOSIS — R053 Chronic cough: Secondary | ICD-10-CM

## 2021-04-21 DIAGNOSIS — R0981 Nasal congestion: Secondary | ICD-10-CM | POA: Diagnosis not present

## 2021-04-21 DIAGNOSIS — R062 Wheezing: Secondary | ICD-10-CM | POA: Diagnosis not present

## 2021-04-21 DIAGNOSIS — Z20822 Contact with and (suspected) exposure to covid-19: Secondary | ICD-10-CM | POA: Diagnosis not present

## 2021-04-21 DIAGNOSIS — J45901 Unspecified asthma with (acute) exacerbation: Secondary | ICD-10-CM | POA: Diagnosis not present

## 2021-04-26 ENCOUNTER — Other Ambulatory Visit (HOSPITAL_COMMUNITY): Payer: Self-pay

## 2021-04-26 MED FILL — Budesonide-Formoterol Fumarate Dihyd Aerosol 160-4.5 MCG/ACT: RESPIRATORY_TRACT | 30 days supply | Qty: 10.2 | Fill #1 | Status: AC

## 2021-05-02 ENCOUNTER — Other Ambulatory Visit (HOSPITAL_COMMUNITY): Payer: Self-pay

## 2021-05-26 ENCOUNTER — Other Ambulatory Visit (HOSPITAL_COMMUNITY): Payer: Self-pay

## 2021-05-26 DIAGNOSIS — J101 Influenza due to other identified influenza virus with other respiratory manifestations: Secondary | ICD-10-CM | POA: Diagnosis not present

## 2021-05-26 DIAGNOSIS — J029 Acute pharyngitis, unspecified: Secondary | ICD-10-CM | POA: Diagnosis not present

## 2021-05-26 MED ORDER — CARESTART COVID-19 HOME TEST VI KIT
PACK | 0 refills | Status: DC
  Filled 2021-05-26: qty 4, 4d supply, fill #0

## 2021-05-26 MED ORDER — OSELTAMIVIR PHOSPHATE 75 MG PO CAPS
ORAL_CAPSULE | ORAL | 0 refills | Status: DC
  Filled 2021-05-26: qty 10, 5d supply, fill #0

## 2021-05-26 MED ORDER — PROMETHAZINE-DM 6.25-15 MG/5ML PO SYRP
ORAL_SOLUTION | ORAL | 0 refills | Status: DC
  Filled 2021-05-26: qty 150, 7d supply, fill #0

## 2021-06-11 DIAGNOSIS — L84 Corns and callosities: Secondary | ICD-10-CM | POA: Diagnosis not present

## 2021-06-21 MED FILL — Budesonide-Formoterol Fumarate Dihyd Aerosol 160-4.5 MCG/ACT: RESPIRATORY_TRACT | 30 days supply | Qty: 10.2 | Fill #2 | Status: CN

## 2021-06-22 ENCOUNTER — Other Ambulatory Visit (HOSPITAL_COMMUNITY): Payer: Self-pay

## 2021-06-30 ENCOUNTER — Other Ambulatory Visit (HOSPITAL_COMMUNITY): Payer: Self-pay

## 2021-07-12 ENCOUNTER — Other Ambulatory Visit (HOSPITAL_COMMUNITY): Payer: Self-pay

## 2021-07-29 ENCOUNTER — Other Ambulatory Visit (HOSPITAL_COMMUNITY): Payer: Self-pay

## 2021-08-01 ENCOUNTER — Ambulatory Visit: Payer: 59 | Admitting: Family Medicine

## 2021-08-01 ENCOUNTER — Other Ambulatory Visit (HOSPITAL_COMMUNITY): Payer: Self-pay

## 2021-08-01 ENCOUNTER — Encounter: Payer: Self-pay | Admitting: Family Medicine

## 2021-08-01 VITALS — BP 165/86 | HR 91 | Ht 66.0 in | Wt 309.0 lb

## 2021-08-01 DIAGNOSIS — N921 Excessive and frequent menstruation with irregular cycle: Secondary | ICD-10-CM | POA: Diagnosis not present

## 2021-08-01 DIAGNOSIS — G43009 Migraine without aura, not intractable, without status migrainosus: Secondary | ICD-10-CM

## 2021-08-01 DIAGNOSIS — N939 Abnormal uterine and vaginal bleeding, unspecified: Secondary | ICD-10-CM | POA: Diagnosis not present

## 2021-08-01 DIAGNOSIS — B354 Tinea corporis: Secondary | ICD-10-CM

## 2021-08-01 MED ORDER — NYSTATIN 100000 UNIT/GM EX OINT
1.0000 "application " | TOPICAL_OINTMENT | Freq: Two times a day (BID) | CUTANEOUS | 0 refills | Status: DC
  Filled 2021-08-01: qty 30, 15d supply, fill #0

## 2021-08-01 MED ORDER — MEGESTROL ACETATE 40 MG PO TABS
40.0000 mg | ORAL_TABLET | Freq: Two times a day (BID) | ORAL | 3 refills | Status: DC
  Filled 2021-08-01: qty 30, 15d supply, fill #0

## 2021-08-01 NOTE — Assessment & Plan Note (Signed)
Needs repeat referral to Dr. Lavell Anchors if possible, referral sent. ?

## 2021-08-01 NOTE — Progress Notes (Signed)
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 Pt notes abnormal bleeding  ? ? ?LMP:07/07/21 lasted 7 days then 07/20/21 lasted 4-5 days both cycles were heavy. ? ?Would like refill on Megace and discuss labs for menopause,. ? ? ? ? ?

## 2021-08-01 NOTE — Progress Notes (Signed)
? ?  Subjective:  ? ? Patient ID: Mary Hanson is a 46 y.o. female presenting with Menstrual Problem ? on 08/01/2021 ? ?HPI: ?Cycles have been normal and suddenly got some irregular bleeding. Then had 2 cycles in March. Bleeding x 4-5 days. ? ?Review of Systems  ?Constitutional:  Negative for chills and fever.  ?Respiratory:  Negative for shortness of breath.   ?Cardiovascular:  Negative for chest pain.  ?Gastrointestinal:  Negative for abdominal pain, nausea and vomiting.  ?Genitourinary:  Negative for dysuria.  ?Skin:  Negative for rash.  ?   ?Objective:  ?  ?BP (!) 165/86   Pulse 91   Ht 5\' 6"  (1.676 m)   Wt (!) 309 lb (140.2 kg)   BMI 49.87 kg/m?  ?Physical Exam ?Exam conducted with a chaperone present.  ?Constitutional:   ?   General: She is not in acute distress. ?   Appearance: She is well-developed.  ?HENT:  ?   Head: Normocephalic and atraumatic.  ?Eyes:  ?   General: No scleral icterus. ?Cardiovascular:  ?   Rate and Rhythm: Normal rate.  ?Pulmonary:  ?   Effort: Pulmonary effort is normal.  ?Abdominal:  ?   Palpations: Abdomen is soft.  ?Musculoskeletal:  ?   Cervical back: Neck supple.  ?Skin: ?   General: Skin is warm and dry.  ?Neurological:  ?   Mental Status: She is alert and oriented to person, place, and time.  ? ? ? ?   ?Assessment & Plan:  ? ?Problem List Items Addressed This Visit   ? ?  ? Unprioritized  ? Migraine headache  ?  Needs repeat referral to Dr. Lavell Anchors if possible, referral sent. ?  ?  ? Relevant Orders  ? Ambulatory referral to Neurology  ? Menorrhagia - Primary  ?  Goes through periods of this. Last EMB was 2014. Will check u/s and labs. Discussed treatment options. Usually responds to Megace. She will use if needed. Refill given. EMB depending on u/s results. ?Continues to desire fertility--interested in having a baby. Risks reviewed especially egg age--discussed egg donation. ?  ?  ? Relevant Medications  ? megestrol (MEGACE) 40 MG tablet  ? Other Relevant Orders  ? Follicle  stimulating hormone  ? TSH  ? US PELVIC COMPLETE WITH TRANSVAGINAL  ? RESOLVED: Abnormal vaginal bleeding  ? ?Other Visit Diagnoses   ? ? Tinea corporis      ? Refilled Nystatin  ? Relevant Medications  ? nystatin ointment (MYCOSTATIN)  ? ?  ? ? ? ?Return in about 3 months (around 10/31/2021). ? ?Donnamae Jude, MD ?08/01/2021 ?2:32 PM ? ? ?

## 2021-08-01 NOTE — Assessment & Plan Note (Addendum)
Goes through periods of this. Last EMB was 2014. Will check u/s and labs. Discussed treatment options. Usually responds to Megace. She will use if needed. Refill given. EMB depending on u/s results. ?Continues to desire fertility--interested in having a baby. Risks reviewed especially egg age--discussed egg donation. ?

## 2021-08-02 ENCOUNTER — Other Ambulatory Visit (HOSPITAL_COMMUNITY): Payer: Self-pay

## 2021-08-02 LAB — FOLLICLE STIMULATING HORMONE: FSH: 5.8 m[IU]/mL

## 2021-08-02 LAB — TSH: TSH: 1.99 u[IU]/mL (ref 0.450–4.500)

## 2021-08-04 ENCOUNTER — Inpatient Hospital Stay: Admission: RE | Admit: 2021-08-04 | Payer: 59 | Source: Ambulatory Visit

## 2021-08-04 ENCOUNTER — Other Ambulatory Visit (HOSPITAL_COMMUNITY): Payer: Self-pay

## 2021-08-04 MED ORDER — HYDROCHLOROTHIAZIDE 25 MG PO TABS
ORAL_TABLET | ORAL | 2 refills | Status: DC
  Filled 2021-08-04 – 2021-08-25 (×2): qty 90, 90d supply, fill #0
  Filled 2022-01-14: qty 90, 90d supply, fill #1
  Filled 2022-06-15: qty 90, 90d supply, fill #2

## 2021-08-04 MED ORDER — NAPROXEN 500 MG PO TABS
ORAL_TABLET | ORAL | 0 refills | Status: DC
  Filled 2021-08-04: qty 28, 14d supply, fill #0

## 2021-08-04 MED ORDER — BUDESONIDE-FORMOTEROL FUMARATE 160-4.5 MCG/ACT IN AERO
INHALATION_SPRAY | RESPIRATORY_TRACT | 3 refills | Status: DC
  Filled 2021-08-04: qty 10.2, 30d supply, fill #0
  Filled 2021-12-09: qty 10.2, 30d supply, fill #1

## 2021-08-05 ENCOUNTER — Other Ambulatory Visit (HOSPITAL_COMMUNITY): Payer: Self-pay

## 2021-08-10 ENCOUNTER — Ambulatory Visit: Admission: RE | Admit: 2021-08-10 | Payer: 59 | Source: Ambulatory Visit

## 2021-08-17 ENCOUNTER — Ambulatory Visit
Admission: RE | Admit: 2021-08-17 | Discharge: 2021-08-17 | Disposition: A | Discharge status: Home / Self Care | Payer: 59 | Source: Ambulatory Visit | Attending: Family Medicine | Admitting: Family Medicine

## 2021-08-17 DIAGNOSIS — N921 Excessive and frequent menstruation with irregular cycle: Secondary | ICD-10-CM | POA: Diagnosis not present

## 2021-08-17 DIAGNOSIS — N920 Excessive and frequent menstruation with regular cycle: Secondary | ICD-10-CM | POA: Diagnosis not present

## 2021-08-26 ENCOUNTER — Other Ambulatory Visit (HOSPITAL_COMMUNITY): Payer: Self-pay

## 2021-08-29 ENCOUNTER — Other Ambulatory Visit (HOSPITAL_COMMUNITY): Payer: Self-pay

## 2021-10-03 ENCOUNTER — Other Ambulatory Visit (HOSPITAL_COMMUNITY): Payer: Self-pay

## 2021-10-04 ENCOUNTER — Other Ambulatory Visit (HOSPITAL_COMMUNITY): Payer: Self-pay

## 2021-10-04 MED ORDER — AMOXICILLIN-POT CLAVULANATE 875-125 MG PO TABS
ORAL_TABLET | ORAL | 0 refills | Status: DC
  Filled 2021-10-04: qty 20, 10d supply, fill #0

## 2021-10-05 ENCOUNTER — Telehealth: Payer: Self-pay | Admitting: *Deleted

## 2021-10-05 ENCOUNTER — Other Ambulatory Visit (HOSPITAL_COMMUNITY): Payer: Self-pay

## 2021-10-05 ENCOUNTER — Ambulatory Visit: Payer: 59 | Admitting: Neurology

## 2021-10-05 ENCOUNTER — Encounter: Payer: Self-pay | Admitting: Neurology

## 2021-10-05 DIAGNOSIS — G43109 Migraine with aura, not intractable, without status migrainosus: Secondary | ICD-10-CM

## 2021-10-05 MED ORDER — PROCHLORPERAZINE MALEATE 10 MG PO TABS
10.0000 mg | ORAL_TABLET | Freq: Four times a day (QID) | ORAL | 11 refills | Status: DC | PRN
  Filled 2021-10-05: qty 30, 8d supply, fill #0

## 2021-10-05 MED ORDER — NURTEC 75 MG PO TBDP: 75.0000 mg | ORAL_TABLET | Freq: Every day | ORAL | 0 refills | Status: DC | PRN

## 2021-10-05 MED ORDER — ELETRIPTAN HYDROBROMIDE 40 MG PO TABS
ORAL_TABLET | ORAL | 11 refills | Status: DC
  Filled 2021-10-05: qty 9, 30d supply, fill #0

## 2021-10-05 NOTE — Patient Instructions (Addendum)
At onset of headache take Relpax(4 hour half life), can take it with an excedrin or ibuprofen/alleve or tylenol, can also take compazine for nausea and also try Nurtec; and can play with combining these medications right at onset of migraine. Also Nurtec has a 12 hour half life so if you think you may have a migraine due to menses or any other suspicion of migraine coming you can a nurtec and see if stops it from occurring.  We have new GREAT medications such as Ascencion Dike, Staples, [place], Bernita Raisin - look them up, great new class of medications  .....Marland KitchenMarland KitchenRimegepant Disintegrating Tablets What is this medication? RIMEGEPANT (ri ME je pant) prevents and treats migraines. It works by blocking a substance in the body that causes migraines. This medicine may be used for other purposes; ask your health care provider or pharmacist if you have questions. COMMON BRAND NAME(S): NURTEC ODT What should I tell my care team before I take this medication? They need to know if you have any of these conditions: Kidney disease Liver disease An unusual or allergic reaction to rimegepant, other medications, foods, dyes, or preservatives Pregnant or trying to get pregnant Breast-feeding How should I use this medication? Take this medication by mouth. Take it as directed on the prescription label. Leave the tablet in the sealed pack until you are ready to take it. With dry hands, open the pack and gently remove the tablet. If the tablet breaks or crumbles, throw it away. Use a new tablet. Place the tablet in the mouth and allow it to dissolve. Then, swallow it. Do not cut, crush, or chew this medication. You do not need water to take this medication. Talk to your care team about the use of this medication in children. Special care may be needed. Overdosage: If you think you have taken too much of this medicine contact a poison control center or emergency room at once. NOTE: This medicine is only for you. Do not  share this medicine with others. What if I miss a dose? This does not apply. This medication is not for regular use. What may interact with this medication? Certain medications for fungal infections, such as fluconazole, itraconazole Rifampin This list may not describe all possible interactions. Give your health care provider a list of all the medicines, herbs, non-prescription drugs, or dietary supplements you use. Also tell them if you smoke, drink alcohol, or use illegal drugs. Some items may interact with your medicine. What should I watch for while using this medication? Visit your care team for regular checks on your progress. Tell your care team if your symptoms do not start to get better or if they get worse. What side effects may I notice from receiving this medication? Side effects that you should report to your care team as soon as possible: Allergic reactions--skin rash, itching, hives, swelling of the face, lips, tongue, or throat Side effects that usually do not require medical attention (report to your care team if they continue or are bothersome): Nausea Stomach pain This list may not describe all possible side effects. Call your doctor for medical advice about side effects. You may report side effects to FDA at 1-800-FDA-1088. Where should I keep my medication? Keep out of the reach of children and pets. Store at room temperature between 20 and 25 degrees C (68 and 77 degrees F). Get rid of any unused medication after the expiration date. To get rid of medications that are no longer needed or have expired:  Take the medication to a medication take-back program. Check with your pharmacy or law enforcement to find a location. If you cannot return the medication, check the label or package insert to see if the medication should be thrown out in the garbage or flushed down the toilet. If you are not sure, ask your care team. If it is safe to put it in the trash, take the medication  out of the container. Mix the medication with cat litter, dirt, coffee grounds, or other unwanted substance. Seal the mixture in a bag or container. Put it in the trash. NOTE: This sheet is a summary. It may not cover all possible information. If you have questions about this medicine, talk to your doctor, pharmacist, or health care provider.  2023 Elsevier/Gold Standard (2021-06-01 00:00:00) Prochlorperazine Tablets What is this medication? PROCHLORPERAZINE (proe klor PER a zeen) treats nausea and vomiting. It works by blocking substances in your body that may cause nausea and vomiting. It may also be used to treat schizophrenia. It works by balancing the levels of dopamine in your brain, a substance that helps regulate mood, behaviors, and thoughts. This medicine may be used for other purposes; ask your health care provider or pharmacist if you have questions. COMMON BRAND NAME(S): Compazine What should I tell my care team before I take this medication? They need to know if you have any of these conditions: Blockage in your bowel Brain tumor Dementia Diabetes Difficulty swallowing Frequently drink alcohol Glaucoma Have trouble controlling your muscles Head injury Heart disease History of irregular heartbeat Liver disease Low blood counts, such as low white cell, platelet, or red cell counts Low blood pressure Lung or breathing disease, such as asthma Parkinson disease Prostate disease Seizures Trouble passing urine An unusual or allergic reaction to prochlorperazine, other medications, foods, dyes, or preservatives Pregnant or trying to get pregnant Breast-feeding How should I use this medication? Take this medication by mouth with a full glass of water. Take it as directed on the prescription label. Take your doses at regular intervals. Do not take your medication more often than directed. Do not stop taking this medication suddenly. This can cause nausea, vomiting, and  dizziness. Ask your care team for advice. Talk to your care team about the use of this medication in children. Special care may be needed. While this medication may be prescribed for children as young as 2 years for selected conditions, precautions do apply. Overdosage: If you think you have taken too much of this medicine contact a poison control center or emergency room at once. NOTE: This medicine is only for you. Do not share this medicine with others. What if I miss a dose? If you miss a dose, take it as soon as you can. If it is almost time for your next dose, take only that dose. Do not take double or extra doses. What may interact with this medication? Do not take this medication with any of the following: Cisapride Dofetilide Dronedarone Metoclopramide Pimozide Saquinavir Thioridazine This medication may also interact with the following: Alcohol Antihistamines for allergy, cough, and cold Atropine Certain medications for anxiety or sleep Certain medications for bladder problems, such as oxybutynin, tolterodine Certain medications for depression, such as amitriptyline, fluoxetine, sertraline Certain medications for stomach problems, such as dicyclomine, hyoscyamine Certain medications for travel sickness, such as scopolamine Epinephrine General anesthetics, such as halothane, isoflurane, methoxyflurane, propofol Ipratropium Levodopa or other medications for Parkinson disease Lithium Medications for blood pressure Medications for seizures, such as phenobarbital,  primidone, phenytoin Medications that relax muscles for surgery Opioid medications for pain Propranolol Warfarin This list may not describe all possible interactions. Give your health care provider a list of all the medicines, herbs, non-prescription drugs, or dietary supplements you use. Also tell them if you smoke, drink alcohol, or use illegal drugs. Some items may interact with your medicine. What should I watch  for while using this medication? Visit your care team for regular checks on your progress. Tell your care team if symptoms do not start to get better or if they get worse. This medication may affect your coordination, reaction time, or judgment. Do not drive or operate machinery until you know how this medication affects you. Sit up or stand slowly to reduce the risk of dizzy or fainting spells. Drinking alcohol with this medication can increase the risk of these side effects. This medication can cause problems with controlling your body temperature. It can lower the response of your body to cold temperatures. If possible, stay indoors during cold weather. If you must go outdoors, wear warm clothes. It can also lower the response of your body to heat. Do not overheat. Do not over-exercise. Stay out of the sun when possible. If you must be in the sun, wear cool clothing. Drink plenty of water. If you have trouble controlling your body temperature, call your care team right away. This medication may increase blood sugar. The risk may be higher in patients who already have diabetes. Ask your care team what you can do to lower your risk of diabetes while taking this medication. This medication can make you more sensitive to the sun. Keep out of the sun. If you cannot avoid being in the sun, wear protective clothing and use sunscreen. Do not use sun lamps or tanning beds/booths. Your mouth may get dry. Chewing sugarless gum or sucking hard candy, and drinking plenty of water may help. Contact your care team if the problem does not go away or is severe. What side effects may I notice from receiving this medication? Side effects that you should report to your care team as soon as possible: Allergic reactions--skin rash, itching, hives, swelling of the face, lips, tongue, or throat High fever, stiff muscles, increased sweating, fast or irregular heartbeat, and confusion, which may be signs of neuroleptic malignant  syndrome High prolactin level--unexpected breast tissue growth, discharge from the nipple, change in sex drive or performance, irregular menstrual cycle Infection--fever, chills, cough, or sore throat Low blood pressure--dizziness, feeling faint or lightheaded, blurry vision Uncontrolled and repetitive body movements, muscle stiffness or spasms, tremors or shaking, loss of balance or coordination, restlessness, shuffling walk, which may be signs of extrapyramidal symptoms (EPS) Side effects that usually do not require medical attention (report to your doctor or health care professional if they continue or are bothersome): Constipation Dizziness Drowsiness Dry mouth This list may not describe all possible side effects. Call your doctor for medical advice about side effects. You may report side effects to FDA at 1-800-FDA-1088. Where should I keep my medication? Keep out of the reach of children and pets. Store at room temperature between 15 and 30 degrees C (59 and 86 degrees F). Protect from light. Get rid of any unused medication after the expiration date. To get rid of medications that are no longer needed or have expired: Take the medications to a medication take-back program. Check with your pharmacy or law enforcement to find a location. If your cannot return the medication, check the label  or package insert to see if the medication should be thrown out in the garbage or flushed down the toilet. If you are not sure, ask your care team. If it is safe to put it in the trash, take the medication out of the container. Mix the medication with cat litter, dirt, coffee grounds, or other unwanted substance. Seal the mixture in a bag or container. Put it in the trash. NOTE: This sheet is a summary. It may not cover all possible information. If you have questions about this medicine, talk to your doctor, pharmacist, or health care provider.  2023 Elsevier/Gold Standard (2021-02-17 00:00:00) Eletriptan  Tablets What is this medication? ELETRIPTAN (el ih TRIP tan) treats migraines. It works by blocking pain signals and narrowing blood vessels in the brain. It belongs to a group of medications called triptans. It is not used to prevent migraines. This medicine may be used for other purposes; ask your health care provider or pharmacist if you have questions. COMMON BRAND NAME(S): Relpax What should I tell my care team before I take this medication? They need to know if you have any of these conditions: Cigarette smoker Circulation problems in fingers and toes Diabetes Heart disease High blood pressure High cholesterol History of irregular heartbeat History of stroke Kidney disease Liver disease Stomach or intestine problems An unusual or allergic reaction to eletriptan, other medications, foods, dyes, or preservatives Pregnant or trying to get pregnant Breast-feeding How should I use this medication? Take this medication by mouth with a glass of water. Follow the directions on the prescription label. Do not take it more often than directed. Talk to your care team regarding the use of this medication in children. Special care may be needed. Overdosage: If you think you have taken too much of this medicine contact a poison control center or emergency room at once. NOTE: This medicine is only for you. Do not share this medicine with others. What if I miss a dose? This does not apply. This medication is not for regular use. What may interact with this medication? Do not take this medication with any of the following: Ceritinib Certain antibiotics like clarithromycin or telithromycin Certain antivirals for HIV or hepatitis Certain medications for fungal infections like ketoconazole, itraconazole, or posaconazole Chloramphenicol Ergot alkaloids like dihydroergotamine, ergonovine, ergotamine, methylergonovine Idelalisib Mifepristone Nefazodone Other medications for migraine headache  like almotriptan, frovatriptan, naratriptan, rizatriptan, sumatriptan, zolmitriptan Ribociclib This medication may also interact with the following: Certain medications for depression, anxiety, or psychotic disorders MAOIs like Carbex, Eldepryl, Marplan, Nardil, and Parnate This list may not describe all possible interactions. Give your health care provider a list of all the medicines, herbs, non-prescription drugs, or dietary supplements you use. Also tell them if you smoke, drink alcohol, or use illegal drugs. Some items may interact with your medicine. What should I watch for while using this medication? Visit your care team for regular checks on your progress. Tell your care team if your symptoms do not start to get better or if they get worse. You may get drowsy or dizzy. Do not drive, use machinery, or do anything that needs mental alertness until you know how this medication affects you. Do not stand up or sit up quickly, especially if you are an older patient. This reduces the risk of dizzy or fainting spells. Alcohol may interfere with the effect of this medication. Your mouth may get dry. Chewing sugarless gum or sucking hard candy and drinking plenty of water may help. Contact your  care team if the problem does not go away or is severe. If you take migraine medications for 10 or more days a month, your migraines may get worse. Keep a diary of headache days and medication use. Contact your care team if your migraine attacks occur more frequently. What side effects may I notice from receiving this medication? Side effects that you should report to your care team as soon as possible: Allergic reactions--skin rash, itching, hives, swelling of the face, lips, tongue, or throat Burning, pain, tingling, or color changes in the legs or feet Heart attack--pain or tightness in the chest, shoulders, arms, or jaw, nausea, shortness of breath, cold or clammy skin, feeling faint or lightheaded Heart  rhythm changes--fast or irregular heartbeat, dizziness, feeling faint or lightheaded, chest pain, trouble breathing Increase in blood pressure Irritability, confusion, fast or irregular heartbeat, muscle stiffness, twitching muscles, sweating, high fever, seizure, chills, vomiting, diarrhea, which may be signs of serotonin syndrome Raynaud's--cool, numb, or painful fingers or toes that may change color from pale, to blue, to red Seizures Stroke--sudden numbness or weakness of the face, arm, or leg, trouble speaking, confusion, trouble walking, loss of balance or coordination, dizziness, severe headache, change in vision Sudden or severe stomach pain, nausea, vomiting, fever, or bloody diarrhea Vision loss Side effects that usually do not require medical attention (report to your care team if they continue or are bothersome): Dizziness General discomfort or fatigue This list may not describe all possible side effects. Call your doctor for medical advice about side effects. You may report side effects to FDA at 1-800-FDA-1088. Where should I keep my medication? Keep out of the reach of children and pets. Store at room temperature between 15 and 30 degrees C (59 and 86 degrees F). Throw away any unused medication after the expiration date. NOTE: This sheet is a summary. It may not cover all possible information. If you have questions about this medicine, talk to your doctor, pharmacist, or health care provider.  2023 Elsevier/Gold Standard (2020-05-19 00:00:00)

## 2021-10-05 NOTE — Progress Notes (Signed)
[facility]    Provider:  Dr Lucia GaskinsAhern Requesting Provider: Reva BoresPratt, Tanya S, MD Primary Care Provider:  Patrecia PourShackleford, Brian Alexander, MD  CC:  migraines  HPI:  Mary Hanson is a 46 y.o. female here as requested by Reva BoresPratt, Tanya S, MD for migraines.   She is having them more frequently around her cycle. We saw her last in 2016-2017, remains episodic, She usually takes excedrin/ibuprofen. She gets 2-3 migraine days a month. They can last 24 hours. Light sensitiivty, pulsating/pounding/throbbing, sleep helps, relpax works, she gets nausea badly, no vomiting, hurts to move, zofran gives her bad headaches. No other focal neurologic deficits, associated symptoms, inciting events or modifiable factors. Not exertional or positional, no changes in quality or severity, no red flags for imaging at this time, no vision changes.   Reviewed notes, labs and imaging from outside physicians, which showed:  From a thorough review of records, medications tried imitrex and relpax and didn't work, also Human resources offic[place] and treximet, Solu-Medrol, Robaxin, Tylenol, amlodipine, Excedrin, Fioricet, Flexeril, Decadron, diclofenac tabs, Relpax, ibuprofen, ketorolac injections, lisinopril, Reglan, naproxen, prednisone and Compazine.  CT head 02/07/2013:  showed No acute intracranial abnormalities including mass lesion or mass effect, hydrocephalus, extra-axial fluid collection, midline shift, hemorrhage, or acute infarction, large ischemic events (personally reviewed images)    Review of Systems: Patient complains of symptoms per HPI as well as the following symptoms migraines. Pertinent negatives and positives per HPI. All others negative.   Social History   Socioeconomic History   Marital status: Single    Spouse name: Not on file   Number of children: 0   Years of education: Ba/Grad [employer]   Highest education level: Not on file  Occupational History    Employer: [employer]  Tobacco Use   Smoking  status: Never   Smokeless tobacco: Never  Vaping Use   Vaping Use: Never used  Substance and Sexual Activity   Alcohol use: Not Currently    Comment: Wine once a year    Drug use: No   Sexual activity: Yes    Birth control/protection: Condom, Pill    Comment: quit BCP whils on megace 7/14  Other Topics Concern   Not on file  Social History Narrative   Lives at home with herself.   Right handed.   Caffeine use: Drinks soda daily (Drinks three 20oz per day)   Social Determinants of Corporate investment bankerHealth   Financial Resource Strain: Not on file  Food Insecurity: Not on file  Transportation Needs: Not on file  Physical Activity: Not on file  Stress: Not on file  Social Connections: Not on file  Intimate Partner Violence: Not on file    Family History  Problem Relation Age of Onset   Hypertension Mother    Diabetes Father    Diabetes type II Father    Hypertension Father    Stroke Father    Goiter Sister [id number]       identical twin   Asthma Sister    Hypertension Sister    Stroke Maternal Grandfather    Diabetes Paternal Grandfather    Heart disease Paternal Grandfather    Migraines Neg Hx     Past Medical History:  Diagnosis Date   Allergy-induced asthma    prn inhaler   Diabetes mellitus without complication (HCC)    History of anemia    no current problem, per pt.   Hypertension    under control with med., has been on med. x 1 yr.  Migraines    Obesity    Pyogenic granuloma 12/2013   right ring finger   Seasonal allergies     Patient Active Problem List   Diagnosis Date Noted   Allergic reaction 05/28/2020   Type 2 diabetes mellitus without complication, without long-term current use of insulin (HCC) 11/28/2018   Urine frequency 09/18/2018   Vaginal irritation 09/18/2018   Moderate persistent asthma without complication 05/16/2018   Nonintractable headache 01/14/2017   Fatigue 02/10/2016   Solitary pulmonary nodule 02/10/2016   Shortness of breath 12/31/2015    Menorrhagia 05/31/2015   Peripheral neuralgia 11/11/2014   Enlarged thyroid 04/03/2013   Unspecified vitamin D deficiency 07/29/2012   External hemorrhoid 09/06/2011   Essential hypertension 12/29/2010   Morbid obesity (HCC) 12/29/2010   Allergic rhinitis 12/29/2010   Migraine headache 12/29/2010    Past Surgical History:  Procedure Laterality Date   CERVICAL CONE BIOPSY  age 2   CHOLECYSTECTOMY  2006 or 2007   DILATION AND CURETTAGE OF UTERUS     DILATION AND EVACUATION  age 53   MASS EXCISION Right 01/16/2014   Procedure: EXCISION PYOGENIC GRANULOMA FROM RIGHT RING FINGER;  Surgeon: Dairl Ponder, MD;  Location: [facility];  Service: Orthopedics;  Laterality: Right;    Current Outpatient Medications  Medication Sig Dispense Refill   albuterol (VENTOLIN HFA) 108 (90 Base) MCG/ACT inhaler Inhale 1-2 puffs into the lungs every 6 (six) hours as needed for wheezing or shortness of breath. 18 g 2   amLODipine (NORVASC) 10 MG tablet Take 1 tablet (10 mg total) by mouth daily. 90 tablet 3   amLODipine (NORVASC) 10 MG tablet Take 1 tablet (10 mg total) by mouth daily. 30 tablet 6   aspirin-acetaminophen-caffeine (EXCEDRIN MIGRAINE) 250-250-65 MG tablet Take 2 tablets by mouth as needed for headache.     budesonide-formoterol (SYMBICORT) 160-4.5 MCG/ACT inhaler Inhale 2 puffs into the lungs twice a day 10.2 g 3   fexofenadine (ALLEGRA ALLERGY) 180 MG tablet Take 1 tablet (180 mg total) by mouth daily. 90 tablet 2   fluticasone (FLONASE) 50 MCG/ACT nasal spray Place 2 sprays into both nostrils daily. 16 g 2   hydrochlorothiazide (HYDRODIURIL) 25 MG tablet Take 1 tablet by mouth every day 90 tablet 2   ibuprofen (ADVIL,MOTRIN) 200 MG tablet Take 800 mg by mouth every 6 (six) hours as needed for headache.     metFORMIN (GLUCOPHAGE) 500 MG tablet Take 1 tablet (500 mg total) by mouth 2 (two) times daily with a meal. 180 tablet 3   methocarbamol (ROBAXIN) 500 MG tablet Take 1  tablet (500 mg total) by mouth every 8 (eight) hours as needed for muscle spasms. 30 tablet 0   prochlorperazine (COMPAZINE) 10 MG tablet Take 1 tablet (10 mg total) by mouth every 6 (six) hours as needed for nausea or vomiting. 30 tablet 11   Rimegepant Sulfate (NURTEC) 75 MG TBDP Take 75 mg by mouth daily as needed. For migraines. Take as close to onset of migraine as possible. One daily maximum. 6 tablet 0   albuterol (VENTOLIN HFA) 108 (90 Base) MCG/ACT inhaler INHALE 1 PUFF EVERY 6 HOURS AS NEEDED INTO THE LUNGS 18 g 6   budesonide-formoterol (SYMBICORT) 160-4.5 MCG/ACT inhaler INHALE 2 PUFFS BY MOUTH 2 TIMES DAILY 10.2 g 3   eletriptan (RELPAX) 40 MG tablet TAKE 1 TABLET BY MOUTH AS NEEDED FOR MIGRAINE OR HEADACHE. MAY REPEAT IN 2 HOURS IF HEADACHE PERSISTS OR RECURS. 9 tablet 11  fluticasone (FLONASE) 50 MCG/ACT nasal spray Place 2 sprays into both nostrils daily for 10 days. 16 g 11   montelukast (SINGULAIR) 10 MG tablet Take 1 tablet (10 mg total) by mouth at bedtime for 30 days. 90 tablet 1   No current facility-administered medications for this visit.    Allergies as of 10/05/2021 - Review Complete 10/05/2021  Allergen Reaction Noted   Mobic [meloxicam] Swelling 05/28/2020   Iodine Nausea And Vomiting 11/19/2014   Augmentin [amoxicillin-pot clavulanate] Nausea And Vomiting 11/11/2014   Lisinopril Swelling 01/11/2015   Zofran [ondansetron hcl] Other (See Comments) 01/16/2014    Vitals: BP (!) 145/92   Pulse 82   Ht  (1.676 m)   Wt (!) 315 lb 9.6 oz (143.2 kg)   BMI 50.94 kg/m  Last Weight:  Wt Readings from Last 1 Encounters:  10/05/21 (!) 315 lb 9.6 oz (143.2 kg)   Last Height:   Ht Readings from Last 1 Encounters:  10/05/21  (1.676 m)     Physical exam: Exam: Gen: NAD, conversant, well nourised, obese, well groomed                     CV: RRR, no MRG. No Carotid Bruits. No peripheral edema, warm, nontender Eyes: Conjunctivae clear without exudates or  hemorrhage  Neuro: Detailed Neurologic Exam  Speech:    Speech is normal; fluent and spontaneous with normal comprehension.  Cognition:    The patient is oriented to person, place, and time;     recent and remote memory intact;     language fluent;     normal attention, concentration,     fund of knowledge Cranial Nerves:    The pupils are equal, round, and reactive to light. The fundi are normal and spontaneous venous pulsations are present. Visual fields are full to finger confrontation. Extraocular movements are intact. Trigeminal sensation is intact and the muscles of mastication are normal. The face is symmetric. The palate elevates in the midline. Hearing intact. Voice is normal. Shoulder shrug is normal. The tongue has normal motion without fasciculations.   Coordination:    Normal  Gait:    normal.   Motor Observation:    No asymmetry, no atrophy, and no involuntary movements noted. Tone:    Normal muscle tone.    Posture:    Posture is normal. normal erect    Strength:    Strength is V/V in the upper and lower limbs.      Sensation: intact to LT     Reflex Exam:  DTR's:    Deep tendon reflexes in the upper and lower extremities are normal bilaterally.   Toes:    The toes are downgoing bilaterally.   Clonus:    Clonus is absent.    Assessment/Plan:  Patient with episodic migraines  At onset of headache take Relpax(4 hour half life), can take it with an excedrin or ibuprofen/alleve or tylenol, can also take compazine for nausea and also try Nurtec; and can play with combining these medications right at onset of migraine. Also Nurtec has a 12 hour half life so if you think you may have a migraine due to menses or any other suspicion of migraine coming you can a nurtec and see if stops it from occurring.  We have new GREAT medications such as Ascencion Dike, [place], [place], Bernita Raisin - look them up, great new class of medications, discussed.  Meds ordered this  encounter  Medications   eletriptan (  RELPAX) 40 MG tablet    Sig: TAKE 1 TABLET BY MOUTH AS NEEDED FOR MIGRAINE OR HEADACHE. MAY REPEAT IN 2 HOURS IF HEADACHE PERSISTS OR RECURS.    Dispense:  9 tablet    Refill:  11   Rimegepant Sulfate (NURTEC) 75 MG TBDP    Sig: Take 75 mg by mouth daily as needed. For migraines. Take as close to onset of migraine as possible. One daily maximum.    Dispense:  6 tablet    Refill:  0   prochlorperazine (COMPAZINE) 10 MG tablet    Sig: Take 1 tablet (10 mg total) by mouth every 6 (six) hours as needed for nausea or vomiting.    Dispense:  30 tablet    Refill:  11    Cc: Reva Bores, MD,  Shackleford, Lovell Sheehan, MD  Naomie Dean, MD  [facility] [address] [place], [place] [address]  Phone [phone] Fax [phone]

## 2021-10-05 NOTE — Telephone Encounter (Signed)
Completed Eletriptan PA on Cover My Meds. Key: [id number]. Awaiting determination from [employer].

## 2021-10-06 NOTE — Telephone Encounter (Signed)
Deniedtoday This request has not been approved. We were asked to perform a prior authorization for coverage of the drug or product listed at the top of this letter under your pharmacy benefit. We denied this request because your plan does not allow certain drugs or classes of drugs to be covered under your pharmacy benefit. You may contact your plan for a list of excluded drugs or products. We denied this request based on the General Exclusion section of the [employer] Formulary. Covered alternatives may include, but are not limited to, sumatriptan tablets and rizatriptan tablets. Your doctor told us you have tried sumatriptan, but we are unable to approve your request because eletriptan is not covered by your plan. Please work with your provider to discuss other treatment options. A written notification letter will follow with additional details.  Will make Dr Lucia Gaskins Aware

## 2021-10-06 NOTE — Telephone Encounter (Addendum)
Spoke to patient decided to go on [employer] and get  a coupon for  Eletriptan . Will make Dr.Ahern Aware of patients decision

## 2021-10-11 ENCOUNTER — Other Ambulatory Visit (HOSPITAL_COMMUNITY): Payer: Self-pay

## 2021-10-11 ENCOUNTER — Emergency Department (HOSPITAL_BASED_OUTPATIENT_CLINIC_OR_DEPARTMENT_OTHER): Payer: 59

## 2021-10-11 ENCOUNTER — Other Ambulatory Visit (HOSPITAL_BASED_OUTPATIENT_CLINIC_OR_DEPARTMENT_OTHER): Payer: Self-pay

## 2021-10-11 ENCOUNTER — Other Ambulatory Visit: Payer: Self-pay

## 2021-10-11 ENCOUNTER — Encounter (HOSPITAL_BASED_OUTPATIENT_CLINIC_OR_DEPARTMENT_OTHER): Payer: Self-pay | Admitting: Emergency Medicine

## 2021-10-11 ENCOUNTER — Emergency Department (HOSPITAL_BASED_OUTPATIENT_CLINIC_OR_DEPARTMENT_OTHER)
Admission: EM | Admit: 2021-10-11 | Discharge: 2021-10-11 | Disposition: A | Discharge status: Home / Self Care | Payer: 59 | Attending: Emergency Medicine | Admitting: Emergency Medicine

## 2021-10-11 DIAGNOSIS — J209 Acute bronchitis, unspecified: Secondary | ICD-10-CM | POA: Diagnosis not present

## 2021-10-11 DIAGNOSIS — R0602 Shortness of breath: Secondary | ICD-10-CM | POA: Diagnosis not present

## 2021-10-11 DIAGNOSIS — J4521 Mild intermittent asthma with (acute) exacerbation: Secondary | ICD-10-CM | POA: Insufficient documentation

## 2021-10-11 DIAGNOSIS — Z7951 Long term (current) use of inhaled steroids: Secondary | ICD-10-CM | POA: Diagnosis not present

## 2021-10-11 DIAGNOSIS — J40 Bronchitis, not specified as acute or chronic: Secondary | ICD-10-CM | POA: Insufficient documentation

## 2021-10-11 DIAGNOSIS — R059 Cough, unspecified: Secondary | ICD-10-CM | POA: Diagnosis not present

## 2021-10-11 DIAGNOSIS — J411 Mucopurulent chronic bronchitis: Secondary | ICD-10-CM

## 2021-10-11 DIAGNOSIS — J441 Chronic obstructive pulmonary disease with (acute) exacerbation: Secondary | ICD-10-CM | POA: Diagnosis not present

## 2021-10-11 MED ORDER — IPRATROPIUM BROMIDE 0.02 % IN SOLN
0.5000 mg | Freq: Three times a day (TID) | RESPIRATORY_TRACT | 12 refills | Status: DC | PRN
  Filled 2021-10-11: qty 75, 10d supply, fill #0

## 2021-10-11 MED ORDER — IPRATROPIUM-ALBUTEROL 0.5-2.5 (3) MG/3ML IN SOLN
3.0000 mL | Freq: Once | RESPIRATORY_TRACT | Status: AC
  Administered 2021-10-11: 3 mL via RESPIRATORY_TRACT
  Filled 2021-10-11: qty 3

## 2021-10-11 MED ORDER — ALBUTEROL SULFATE (2.5 MG/3ML) 0.083% IN NEBU
2.5000 mg | INHALATION_SOLUTION | Freq: Four times a day (QID) | RESPIRATORY_TRACT | 12 refills | Status: AC | PRN
  Filled 2021-10-11: qty 75, 7d supply, fill #0
  Filled 2022-09-13: qty 75, 7d supply, fill #1

## 2021-10-11 MED ORDER — PREDNISONE 50 MG PO TABS
50.0000 mg | ORAL_TABLET | Freq: Every day | ORAL | 0 refills | Status: AC
  Filled 2021-10-11: qty 5, 5d supply, fill #0

## 2021-10-11 MED ORDER — PREDNISONE 50 MG PO TABS
60.0000 mg | ORAL_TABLET | Freq: Once | ORAL | Status: AC
  Administered 2021-10-11: 60 mg via ORAL
  Filled 2021-10-11: qty 1

## 2021-10-11 MED ORDER — ATROVENT HFA 17 MCG/ACT IN AERS
2.0000 | INHALATION_SPRAY | Freq: Two times a day (BID) | RESPIRATORY_TRACT | 12 refills | Status: DC
  Filled 2021-10-11: qty 12.9, 30d supply, fill #0

## 2021-10-11 NOTE — ED Triage Notes (Signed)
Pt is c/o cough, congestion, and shortness of breath for a couple weeks  Pt was seen by her PCP and was put on Augmentin  Pt stated it on Wed of last week but has only been taking it once a day  Pt was in [place] My 9th - June 9th and had the same there  Pt was given prednisone and erythromycin and an inhaler

## 2021-10-11 NOTE — Progress Notes (Signed)
Pt arrives endorsing shortness of breath, cough and congestion. Pt has been taking symbicort along with atrovent inhaler given to her while in [place]. Pt able to speak in full sentences, spO2 98% on room air. Diminished bilateral breath sounds with a faint expiratory wheeze in the right upper lobe. RT will continue to monitor and be available as needed.

## 2021-10-11 NOTE — ED Provider Notes (Incomplete)
[facility] EMERGENCY DEPARTMENT Provider Note   CSN: [id number] Arrival date & time: 10/11/21  0630     History {Add pertinent medical, surgical, social history, OB history to HPI:1} Chief Complaint  Patient presents with   Shortness of Breath    Mary Hanson is a 46 y.o. female.  Patient is a 46 year old female with past medical history of asthma presenting for complaints of cough.  She admits to shortness of breath, cough, and nasal congestion x2 weeks.  She admits to prior history of her symptoms in early May 2023.  Had resolution of symptoms at that time and recurrence of symptoms 2 weeks ago.  States she had a virtual primary care visit in which she was diagnosed with bacterial sinusitis and at home with prescription for amoxicillin.  Patient states has been taking amoxicillin daily x7 days however missed read the label and was taking it once a day instead of twice a day.  Patient states cough is worse at night.  Takes albuterol inhaler every 4 hours as needed for shortness of breath and Symbicort in the morning and before bedtime daily.  Dates she is using her albuterol inhaler every 4 hours and sooner due to shortness of breath.  Has initial improvement but does not last long.  The history is provided by the patient. No language interpreter was used.  Shortness of Breath Associated symptoms: cough and wheezing   Associated symptoms: no abdominal pain, no chest pain, no ear pain, no fever, no rash, no sore throat and no vomiting        Home Medications Prior to Admission medications   Medication Sig Start Date End Date Taking? Authorizing Provider  albuterol (PROVENTIL) (2.5 MG/3ML) 0.083% nebulizer solution Take 3 mLs (2.5 mg total) by nebulization every 6 (six) hours as needed for wheezing or shortness of breath. 10/11/21  Yes Edwin Dada P, DO  ipratropium (ATROVENT HFA) 17 MCG/ACT inhaler Inhale 2 puffs into the lungs in the morning and at bedtime. 10/11/21  Yes  Edwin Dada P, DO  ipratropium (ATROVENT) 0.02 % nebulizer solution Take 2.5 mLs (0.5 mg total) by nebulization every 8 (eight) hours as needed for wheezing or shortness of breath. 10/11/21  Yes Edwin Dada P, DO  predniSONE (DELTASONE) 50 MG tablet Take 1 tablet (50 mg total) by mouth daily for 5 days. 10/11/21 10/16/21 Yes Edwin Dada P, DO  amLODipine (NORVASC) 10 MG tablet Take 1 tablet (10 mg total) by mouth daily. 11/28/18   Reva Bores, MD  amLODipine (NORVASC) 10 MG tablet Take 1 tablet (10 mg total) by mouth daily. 02/09/21     aspirin-acetaminophen-caffeine (EXCEDRIN MIGRAINE) 250-250-65 MG tablet Take 2 tablets by mouth as needed for headache.    [provider]  budesonide-formoterol (SYMBICORT) 160-4.5 MCG/ACT inhaler INHALE 2 PUFFS BY MOUTH 2 TIMES DAILY 07/07/20 07/07/21  Shackleford, Lovell Sheehan, MD  budesonide-formoterol [facility]) 160-4.5 MCG/ACT inhaler Inhale 2 puffs into the lungs twice a day 08/04/21     eletriptan (RELPAX) 40 MG tablet TAKE 1 TABLET BY MOUTH AS NEEDED FOR MIGRAINE OR HEADACHE. MAY REPEAT IN 2 HOURS IF HEADACHE PERSISTS OR RECURS. 10/05/21   Anson Fret, MD  fexofenadine [facility] ALLERGY) 180 MG tablet Take 1 tablet (180 mg total) by mouth daily. 01/14/19   Caro Laroche, DO  fluticasone (FLONASE) 50 MCG/ACT nasal spray Place 2 sprays into both nostrils daily for 10 days. 05/16/18 05/26/18  Shon Hale, MD  fluticasone (FLONASE) 50 MCG/ACT  nasal spray Place 2 sprays into both nostrils daily. 08/25/20   Reva Bores, MD  hydrochlorothiazide (HYDRODIURIL) 25 MG tablet Take 1 tablet by mouth every day 08/04/21     ibuprofen (ADVIL,MOTRIN) 200 MG tablet Take 800 mg by mouth every 6 (six) hours as needed for headache.    [provider]  metFORMIN (GLUCOPHAGE) 500 MG tablet Take 1 tablet (500 mg total) by mouth 2 (two) times daily with a meal. 11/28/18   Reva Bores, MD  methocarbamol (ROBAXIN) 500 MG tablet Take 1 tablet (500 mg  total) by mouth every 8 (eight) hours as needed for muscle spasms. 12/30/20   Magnant, Charles L, PA-C  montelukast (SINGULAIR) 10 MG tablet Take 1 tablet (10 mg total) by mouth at bedtime for 30 days. 05/16/18 10/08/20  Shon Hale, MD  prochlorperazine (COMPAZINE) 10 MG tablet Take 1 tablet (10 mg total) by mouth every 6 (six) hours as needed for nausea or vomiting. 10/05/21   Anson Fret, MD  Rimegepant Sulfate (NURTEC) 75 MG TBDP Take 75 mg by mouth daily as needed. For migraines. Take as close to onset of migraine as possible. One daily maximum. 10/05/21   Anson Fret, MD      Allergies    Mobic [meloxicam], Iodine, Augmentin [amoxicillin-pot clavulanate], Lisinopril, and Zofran [ondansetron hcl]    Review of Systems   Review of Systems  Constitutional:  Negative for chills and fever.  HENT:  Negative for ear pain and sore throat.   Eyes:  Negative for pain and visual disturbance.  Respiratory:  Positive for cough, shortness of breath and wheezing.   Cardiovascular:  Negative for chest pain and palpitations.  Gastrointestinal:  Negative for abdominal pain and vomiting.  Genitourinary:  Negative for dysuria and hematuria.  Musculoskeletal:  Negative for arthralgias and back pain.  Skin:  Negative for color change and rash.  Neurological:  Negative for seizures and syncope.  All other systems reviewed and are negative.   Physical Exam Updated Vital Signs BP 135/88 (BP Location: Left Arm)   Pulse 77   Temp 97.8 F (36.6 C) (Oral)   Resp 20   Ht 5\' 6"  (1.676 m)   Wt (!) 142.9 kg   LMP 09/14/2021 (Approximate)   SpO2 98%   BMI 50.84 kg/m  Physical Exam Vitals and nursing note reviewed.  Constitutional:      General: She is not in acute distress.    Appearance: She is well-developed.  HENT:     Head: Normocephalic and atraumatic.  Eyes:     Conjunctiva/sclera: Conjunctivae normal.  Cardiovascular:     Rate and Rhythm: Normal rate and regular rhythm.      Heart sounds: No murmur heard. Pulmonary:     Effort: Pulmonary effort is normal. No respiratory distress.     Breath sounds: Normal breath sounds.  Abdominal:     Palpations: Abdomen is soft.     Tenderness: There is no abdominal tenderness (.now).  Musculoskeletal:        General: No swelling.     Cervical back: Neck supple.  Skin:    General: Skin is warm and dry.     Capillary Refill: Capillary refill takes less than 2 seconds.  Neurological:     Mental Status: She is alert.  Psychiatric:        Mood and Affect: Mood normal.     ED Results / Procedures / Treatments   Labs (all labs ordered are listed, but  only abnormal results are displayed) Labs Reviewed - No data to display  EKG None  Radiology No results found.  Procedures Procedures  {Document cardiac monitor, telemetry assessment procedure when appropriate:1}  Medications Ordered in ED Medications  predniSONE (DELTASONE) tablet 60 mg (has no administration in time range)  ipratropium-albuterol (DUONEB) 0.5-2.5 (3) MG/3ML nebulizer solution 3 mL (has no administration in time range)    ED Course/ Medical Decision Making/ A&P                           Medical Decision Making Amount and/or Complexity of Data Reviewed Radiology: ordered.  Risk Prescription drug management.   29:45 AM 46 year old female with past medical history of asthma presenting for complaints of shortness of breath and cough.  Patient is alert and oriented x3, no acute distress, afebrile, stable vital signs.  Physical exam demonstrates stational dyspnea every 5-6 words.  No retractions, hypoxia, or tachycardia.  Breath sounds are clear bilaterally.  Duo nebs and prednisone given in ED.  X-ray demonstrates no focal pneumonia.  At this time I think patient's symptoms are likely secondary to asthma and possibly recurrent bronchitis.  Patient sent home with prednisone, ipratropium, and prescription for albuterol for nebulizer.    {Document  critical care time when appropriate:1} {Document review of labs and clinical decision tools ie heart score, Chads2Vasc2 etc:1}  {Document your independent review of radiology images, and any outside records:1} {Document your discussion with family members, caretakers, and with consultants:1} {Document social determinants of health affecting pt's care:1} {Document your decision making why or why not admission, treatments were needed:1} Final Clinical Impression(s) / ED Diagnoses Final diagnoses:  None    Rx / DC Orders ED Discharge Orders          Ordered    predniSONE (DELTASONE) 50 MG tablet  Daily        10/11/21 0730    ipratropium (ATROVENT HFA) 17 MCG/ACT inhaler  2 times daily        10/11/21 0730    albuterol (PROVENTIL) (2.5 MG/3ML) 0.083% nebulizer solution  Every 6 hours PRN        10/11/21 0730    ipratropium (ATROVENT) 0.02 % nebulizer solution  Every 8 hours PRN        10/11/21 0730

## 2021-10-12 ENCOUNTER — Encounter: Payer: Self-pay | Admitting: Family Medicine

## 2021-10-12 ENCOUNTER — Other Ambulatory Visit (HOSPITAL_COMMUNITY): Payer: Self-pay

## 2021-10-12 ENCOUNTER — Other Ambulatory Visit (HOSPITAL_COMMUNITY)
Admission: RE | Admit: 2021-10-12 | Discharge: 2021-10-12 | Disposition: A | Discharge status: Home / Self Care | Payer: 59 | Source: Ambulatory Visit | Attending: Family Medicine | Admitting: Family Medicine

## 2021-10-12 ENCOUNTER — Ambulatory Visit (INDEPENDENT_AMBULATORY_CARE_PROVIDER_SITE_OTHER): Payer: 59 | Admitting: Family Medicine

## 2021-10-12 VITALS — BP 164/93 | HR 101 | Wt 313.0 lb

## 2021-10-12 DIAGNOSIS — Z124 Encounter for screening for malignant neoplasm of cervix: Secondary | ICD-10-CM | POA: Diagnosis not present

## 2021-10-12 DIAGNOSIS — Z01411 Encounter for gynecological examination (general) (routine) with abnormal findings: Secondary | ICD-10-CM

## 2021-10-12 DIAGNOSIS — B3731 Acute candidiasis of vulva and vagina: Secondary | ICD-10-CM

## 2021-10-12 DIAGNOSIS — Z1211 Encounter for screening for malignant neoplasm of colon: Secondary | ICD-10-CM | POA: Diagnosis not present

## 2021-10-12 DIAGNOSIS — Z1231 Encounter for screening mammogram for malignant neoplasm of breast: Secondary | ICD-10-CM

## 2021-10-12 MED ORDER — FLUCONAZOLE 150 MG PO TABS
150.0000 mg | ORAL_TABLET | Freq: Once | ORAL | 0 refills | Status: AC
  Filled 2021-10-12: qty 1, 1d supply, fill #0

## 2021-10-12 NOTE — Progress Notes (Signed)
Subjective:     Mary Hanson is a 46 y.o. female and is here for a comprehensive physical exam. The patient reports no problems.     The following portions of the patient's history were reviewed and updated as appropriate: allergies, current medications, past family history, past medical history, past social history, past surgical history, and problem list.  Review of Systems Pertinent items noted in HPI and remainder of comprehensive ROS otherwise negative.   Objective:    BP (!) 164/93   Pulse (!) 101   Wt (!) 313 lb (142 kg)   LMP 09/08/2021 (Approximate)   BMI 50.52 kg/m  General appearance: alert, cooperative, and appears stated age Head: Normocephalic, without obvious abnormality, atraumatic Neck: no adenopathy, supple, symmetrical, trachea midline, and thyroid not enlarged, symmetric, no tenderness/mass/nodules Lungs: clear to auscultation bilaterally Breasts: normal appearance, no masses or tenderness Heart: regular rate and rhythm, S1, S2 normal, no murmur, click, rub or gallop Abdomen: soft, non-tender; bowel sounds normal; no masses,  no organomegaly Pelvic: cervix normal in appearance, external genitalia normal, no adnexal masses or tenderness, no cervical motion tenderness, uterus normal size, shape, and consistency, and vagina normal without discharge Extremities: extremities normal, atraumatic, no cyanosis or edema Pulses: 2+ and symmetric Skin: Skin color, texture, turgor normal. No rashes or lesions Lymph nodes: Cervical, supraclavicular, and axillary nodes normal. Neurologic: Grossly normal    Assessment:   GYN female exam.      Plan:  Screening for malignant neoplasm of cervix - Plan: Cytology - PAP  Encounter for gynecological examination with abnormal finding  Encounter for screening mammogram for malignant neoplasm of breast - Plan: MM 3D SCREEN BREAST BILATERAL  Yeast vaginitis - on Augmentin for sinus infection-->usually gets yeast due to this.  - Plan: fluconazole (DIFLUCAN) 150 MG tablet  Screen for colon cancer - Plan: Ambulatory referral to Gastroenterology  Return in 1 year (on 10/13/2022).    See After Visit Summary for Counseling Recommendations

## 2021-10-12 NOTE — Progress Notes (Signed)
Patient presents for annual exam.   LMP:09/08/21. Last Mammogram: Done at [facility] 2 yrs ago in [place]. Had to have U/S done on Right Breast.  STD Screening: Declined Not sexually active. Last pap:09/13/2017 WNL  Family Hx of Breast Cancer: None  Pt is currently on Augmentin due to a sinus infection. Pt requesting Diflucan for possible yeast infection from taking antibiotics.

## 2021-10-14 ENCOUNTER — Other Ambulatory Visit (HOSPITAL_COMMUNITY): Payer: Self-pay

## 2021-10-17 LAB — CYTOLOGY - PAP
Comment: NEGATIVE
Diagnosis: NEGATIVE
High risk HPV: NEGATIVE

## 2021-10-18 ENCOUNTER — Encounter: Payer: Self-pay | Admitting: Family Medicine

## 2021-10-18 ENCOUNTER — Other Ambulatory Visit (HOSPITAL_COMMUNITY): Payer: Self-pay

## 2021-10-18 ENCOUNTER — Other Ambulatory Visit: Payer: Self-pay | Admitting: *Deleted

## 2021-10-18 MED ORDER — FLUCONAZOLE 150 MG PO TABS
150.0000 mg | ORAL_TABLET | Freq: Once | ORAL | 1 refills | Status: AC
  Filled 2021-10-18: qty 1, 1d supply, fill #0

## 2021-10-19 ENCOUNTER — Ambulatory Visit
Admission: RE | Admit: 2021-10-19 | Discharge: 2021-10-19 | Disposition: A | Discharge status: Home / Self Care | Payer: 59 | Source: Ambulatory Visit | Attending: Family Medicine | Admitting: Family Medicine

## 2021-10-19 DIAGNOSIS — Z1231 Encounter for screening mammogram for malignant neoplasm of breast: Secondary | ICD-10-CM | POA: Diagnosis not present

## 2021-11-19 ENCOUNTER — Other Ambulatory Visit (HOSPITAL_COMMUNITY): Payer: Self-pay

## 2021-12-09 ENCOUNTER — Ambulatory Visit
Admission: RE | Admit: 2021-12-09 | Discharge: 2021-12-09 | Disposition: A | Discharge status: Home / Self Care | Payer: 59 | Source: Ambulatory Visit | Attending: Family Medicine | Admitting: Family Medicine

## 2021-12-09 ENCOUNTER — Other Ambulatory Visit (HOSPITAL_COMMUNITY): Payer: Self-pay

## 2021-12-09 VITALS — BP 156/89 | HR 93 | Temp 98.3°F | Resp 18

## 2021-12-09 DIAGNOSIS — J45901 Unspecified asthma with (acute) exacerbation: Secondary | ICD-10-CM | POA: Diagnosis not present

## 2021-12-09 DIAGNOSIS — E1165 Type 2 diabetes mellitus with hyperglycemia: Secondary | ICD-10-CM

## 2021-12-09 LAB — POCT FASTING CBG KUC MANUAL ENTRY: POCT Glucose (KUC): 234 mg/dL — AB (ref 70–99)

## 2021-12-09 MED ORDER — GLIPIZIDE 5 MG PO TABS
5.0000 mg | ORAL_TABLET | Freq: Every day | ORAL | 0 refills | Status: DC
  Filled 2021-12-09: qty 30, 30d supply, fill #0

## 2021-12-09 MED ORDER — ALBUTEROL SULFATE HFA 108 (90 BASE) MCG/ACT IN AERS
2.0000 | INHALATION_SPRAY | RESPIRATORY_TRACT | 0 refills | Status: DC | PRN
  Filled 2021-12-09: qty 6.7, 16d supply, fill #0

## 2021-12-09 MED ORDER — PREDNISONE 10 MG PO TABS
ORAL_TABLET | ORAL | 0 refills | Status: DC
  Filled 2021-12-09: qty 13, 8d supply, fill #0

## 2021-12-09 NOTE — ED Triage Notes (Signed)
Asthma flared over the last 3 days. Continues to wheeze. Has tried her inhalers without improvement. Using her neb treatments at home without improvement. Does feel SOB. Here today requesting steroids.

## 2021-12-09 NOTE — Discharge Instructions (Addendum)
Your blood sugar is elevated > 200 which indicates poorly controlled diabetes. Due to asthma requiring steroids, I am placing you on Glipizide blood sugar lowering medication for you to take daily with meals . Continue this medication until you follow-up with PCP. Given your blood sugar reading, you may want to try and get an earlier appointment with your PCP-within the next 4 week.  Continue nebulizer treatments every 6 hours as needed while symptomatic of wheezing and shortness of breath. Rescue  inhaler refilled. ER if any of your symptoms become severe and do not improve with rescue medications.

## 2021-12-09 NOTE — ED Provider Notes (Signed)
Mary Hanson    CSN: [id number] Arrival date & time: 12/09/21  1114      History   Chief Complaint Chief Complaint  Patient presents with  . Cough    Cough related to Asthma flare up - Entered by patient  . Appointment    HPI SELAM PIETSCH is a 46 y.o. female.   HPI   Past Medical History:  Diagnosis Date  . Allergy-induced asthma    prn inhaler  . Diabetes mellitus without complication (HCC)   . History of anemia    no current problem, per pt.  . Hypertension    under control with med., has been on med. x 1 yr.  . Migraines   . Obesity   . Pyogenic granuloma 12/2013   right ring finger  . Seasonal allergies     Patient Active Problem List   Diagnosis Date Noted  . Type 2 diabetes mellitus without complication, without long-term current use of insulin (HCC) 11/28/2018  . Moderate persistent asthma without complication 05/16/2018  . Solitary pulmonary nodule 02/10/2016  . Menorrhagia 05/31/2015  . Peripheral neuralgia 11/11/2014  . Enlarged thyroid 04/03/2013  . Unspecified vitamin D deficiency 07/29/2012  . External hemorrhoid 09/06/2011  . Essential hypertension 12/29/2010  . Morbid obesity (HCC) 12/29/2010  . Allergic rhinitis 12/29/2010  . Migraine headache 12/29/2010    Past Surgical History:  Procedure Laterality Date  . BREAST BIOPSY Right 04/24/2018   Rt br bx @  [facility]  . CERVICAL CONE BIOPSY  age 27  . CHOLECYSTECTOMY  2006 or 2007  . DILATION AND CURETTAGE OF UTERUS    . DILATION AND EVACUATION  age 70  . MASS EXCISION Right 01/16/2014   Procedure: EXCISION PYOGENIC GRANULOMA FROM RIGHT RING FINGER;  Surgeon: Dairl Ponder, MD;  Location: [facility];  Service: Orthopedics;  Laterality: Right;    OB History     Gravida  1   Para      Term      Preterm      AB  1   Living  0      SAB      IAB  1   Ectopic      Multiple      Live Births               Home  Medications    Prior to Admission medications   Medication Sig Start Date End Date Taking? Authorizing Provider  albuterol (PROVENTIL) (2.5 MG/3ML) 0.083% nebulizer solution Take 3 mLs (2.5 mg total) by nebulization every 6 (six) hours as needed for wheezing or shortness of breath. 10/11/21  Yes Edwin Dada P, DO  amLODipine (NORVASC) 10 MG tablet Take 1 tablet (10 mg total) by mouth daily. 11/28/18  Yes Reva Bores, MD  budesonide-formoterol [facility]) 160-4.5 MCG/ACT inhaler Inhale 2 puffs into the lungs twice a day 08/04/21  Yes   eletriptan (RELPAX) 40 MG tablet TAKE 1 TABLET BY MOUTH AS NEEDED FOR MIGRAINE OR HEADACHE. MAY REPEAT IN 2 HOURS IF HEADACHE PERSISTS OR RECURS. 10/05/21  Yes Anson Fret, MD  hydrochlorothiazide (HYDRODIURIL) 25 MG tablet Take 1 tablet by mouth every day 08/04/21  Yes   ipratropium (ATROVENT HFA) 17 MCG/ACT inhaler Inhale 2 puffs by mouth into the lungs in the morning and at bedtime. 10/11/21  Yes Edwin Dada P, DO  prochlorperazine (COMPAZINE) 10 MG tablet Take 1 tablet (10 mg total) by mouth every 6 (  six) hours as needed for nausea or vomiting. 10/05/21  Yes Anson Fret, MD  Rimegepant Sulfate (NURTEC) 75 MG TBDP Take 75 mg by mouth daily as needed. For migraines. Take as close to onset of migraine as possible. One daily maximum. 10/05/21  Yes Anson Fret, MD  amoxicillin-clavulanate (AUGMENTIN) 875-125 MG tablet Take 1 tablet by mouth 2 (two) times daily.    [provider]  fexofenadine (ALLEGRA ALLERGY) 180 MG tablet Take 1 tablet (180 mg total) by mouth daily. 01/14/19   Caro Laroche, DO  fluticasone (FLONASE) 50 MCG/ACT nasal spray Place 2 sprays into both nostrils daily for 10 days. 05/16/18 05/26/18  Shon Hale, MD  ibuprofen (ADVIL,MOTRIN) 200 MG tablet Take 800 mg by mouth every 6 (six) hours as needed for headache.    [provider]  ipratropium (ATROVENT) 0.02 % nebulizer solution Take 2.5 mLs (0.5 mg total) by  nebulization every 8 (eight) hours as needed for wheezing or shortness of breath. 10/11/21   Edwin Dada P, DO  methocarbamol (ROBAXIN) 500 MG tablet Take 1 tablet (500 mg total) by mouth every 8 (eight) hours as needed for muscle spasms. 12/30/20   Magnant, Charles L, PA-C  montelukast (SINGULAIR) 10 MG tablet Take 1 tablet (10 mg total) by mouth at bedtime for 30 days. 05/16/18 10/08/20  Shon Hale, MD    Family History Family History  Problem Relation Age of Onset  . Hypertension Mother   . Diabetes Father   . Diabetes type II Father   . Hypertension Father   . Stroke Father   . Goiter Sister 59       identical twin  . Asthma Sister   . Hypertension Sister   . Stroke Maternal Grandfather   . Diabetes Paternal Grandfather   . Heart disease Paternal Grandfather   . Migraines Neg Hx     Social History Social History   Tobacco Use  . Smoking status: Never  . Smokeless tobacco: Never  Vaping Use  . Vaping Use: Never used  Substance Use Topics  . Alcohol use: Not Currently    Comment: Wine once a year   . Drug use: No     Allergies   Mobic [meloxicam], Iodine, Augmentin [amoxicillin-pot clavulanate], Lisinopril, and Zofran [ondansetron hcl]   Review of Systems Review of Systems   Physical Exam Triage Vital Signs ED Triage Vitals  Enc Vitals Group     BP 12/09/21 1123 (!) 156/89     Pulse Rate 12/09/21 1123 93     Resp 12/09/21 1123 18     Temp 12/09/21 1123 98.3 F (36.8 C)     Temp Source 12/09/21 1123 Oral     SpO2 12/09/21 1123 95 %     Weight --      Height --      Head Circumference --      Peak Flow --      Pain Score 12/09/21 1129 0     Pain Loc --      Pain Edu? --      Excl. in GC? --    No data found.  Updated Vital Signs BP (!) 156/89 (BP Location: Left Wrist)   Pulse 93   Temp 98.3 F (36.8 C) (Oral)   Resp 18   SpO2 95%   Visual Acuity Right Eye Distance:   Left Eye Distance:   Bilateral Distance:    Right Eye Near:    Left Eye Near:  Bilateral Near:     Physical Exam   UC Treatments / Results  Labs (all labs ordered are listed, but only abnormal results are displayed) Labs Reviewed - No data to display  EKG   Radiology No results found.  Procedures Procedures (including critical care time)  Medications Ordered in UC Medications - No data to display  Initial Impression / Assessment and Plan / UC Course  I have reviewed the triage vital signs and the nursing notes.  Pertinent labs & imaging results that were available during my care of the patient were reviewed by me and considered in my medical decision making (see chart for details).     *** Final Clinical Impressions(s) / UC Diagnoses   Final diagnoses:  None   Discharge Instructions   None    ED Prescriptions   None    PDMP not reviewed this encounter.

## 2021-12-12 DIAGNOSIS — Z20822 Contact with and (suspected) exposure to covid-19: Secondary | ICD-10-CM | POA: Diagnosis not present

## 2021-12-12 DIAGNOSIS — R051 Acute cough: Secondary | ICD-10-CM | POA: Diagnosis not present

## 2021-12-12 DIAGNOSIS — J069 Acute upper respiratory infection, unspecified: Secondary | ICD-10-CM | POA: Diagnosis not present

## 2021-12-13 ENCOUNTER — Other Ambulatory Visit: Payer: Self-pay

## 2021-12-13 ENCOUNTER — Emergency Department (HOSPITAL_BASED_OUTPATIENT_CLINIC_OR_DEPARTMENT_OTHER)
Admission: EM | Admit: 2021-12-13 | Discharge: 2021-12-13 | Disposition: A | Discharge status: Home / Self Care | Payer: 59 | Attending: Emergency Medicine | Admitting: Emergency Medicine

## 2021-12-13 ENCOUNTER — Encounter (HOSPITAL_BASED_OUTPATIENT_CLINIC_OR_DEPARTMENT_OTHER): Payer: Self-pay | Admitting: Emergency Medicine

## 2021-12-13 ENCOUNTER — Emergency Department (HOSPITAL_BASED_OUTPATIENT_CLINIC_OR_DEPARTMENT_OTHER): Payer: 59

## 2021-12-13 DIAGNOSIS — R0602 Shortness of breath: Secondary | ICD-10-CM | POA: Diagnosis not present

## 2021-12-13 DIAGNOSIS — J45901 Unspecified asthma with (acute) exacerbation: Secondary | ICD-10-CM | POA: Diagnosis not present

## 2021-12-13 DIAGNOSIS — Z7951 Long term (current) use of inhaled steroids: Secondary | ICD-10-CM | POA: Diagnosis not present

## 2021-12-13 DIAGNOSIS — Z7952 Long term (current) use of systemic steroids: Secondary | ICD-10-CM | POA: Diagnosis not present

## 2021-12-13 LAB — CBC
HCT: 43 % (ref 36.0–46.0)
Hemoglobin: 13.7 g/dL (ref 12.0–15.0)
MCH: 27.8 pg (ref 26.0–34.0)
MCHC: 31.9 g/dL (ref 30.0–36.0)
MCV: 87.2 fL (ref 80.0–100.0)
Platelets: 322 10*3/uL (ref 150–400)
RBC: 4.93 MIL/uL (ref 3.87–5.11)
RDW: 14.8 % (ref 11.5–15.5)
WBC: 10 10*3/uL (ref 4.0–10.5)
nRBC: 0 % (ref 0.0–0.2)

## 2021-12-13 LAB — BASIC METABOLIC PANEL
Anion gap: 11 (ref 5–15)
BUN: 8 mg/dL (ref 6–20)
CO2: 31 mmol/L (ref 22–32)
Calcium: 9 mg/dL (ref 8.9–10.3)
Chloride: 94 mmol/L — ABNORMAL LOW (ref 98–111)
Creatinine, Ser: 0.71 mg/dL (ref 0.44–1.00)
GFR, Estimated: >60 mL/min (ref >60)
Glucose, Bld: 106 mg/dL — ABNORMAL HIGH (ref 70–99)
Potassium: 3.2 mmol/L — ABNORMAL LOW (ref 3.5–5.1)
Sodium: 136 mmol/L (ref 135–145)

## 2021-12-13 LAB — D-DIMER, QUANTITATIVE: D-Dimer, Quant: <0.27 ug/mL-FEU (ref 0.00–0.50)

## 2021-12-13 LAB — TROPONIN I (HIGH SENSITIVITY): Troponin I (High Sensitivity): 2 ng/L (ref <18)

## 2021-12-13 MED ORDER — IPRATROPIUM-ALBUTEROL 0.5-2.5 (3) MG/3ML IN SOLN
3.0000 mL | Freq: Once | RESPIRATORY_TRACT | Status: AC
Start: 2021-12-13 — End: 2021-12-13
  Administered 2021-12-13: 3 mL via RESPIRATORY_TRACT

## 2021-12-13 MED ORDER — PREDNISONE 10 MG PO TABS
40.0000 mg | ORAL_TABLET | Freq: Every day | ORAL | 0 refills | Status: AC
  Filled 2021-12-13: qty 20, 5d supply, fill #0

## 2021-12-13 MED ORDER — IPRATROPIUM-ALBUTEROL 0.5-2.5 (3) MG/3ML IN SOLN
3.0000 mL | Freq: Once | RESPIRATORY_TRACT | Status: AC
  Administered 2021-12-13: 3 mL via RESPIRATORY_TRACT
  Filled 2021-12-13: qty 3

## 2021-12-13 MED ORDER — IPRATROPIUM-ALBUTEROL 0.5-2.5 (3) MG/3ML IN SOLN
RESPIRATORY_TRACT | Status: AC
  Filled 2021-12-13: qty 3

## 2021-12-13 NOTE — Discharge Instructions (Addendum)
You were evaluated in the emergency department for shortness of breath.  The results of your evaluation was reassuring.  I suspect that your symptoms are due to an acute asthma exacerbation, possibly due to an underlying COVID-19 infection, rather than a pulmonary embolism or heart attack.  I will discharge you with a short course of steroids.  Please take 4 tabs per day for a total of 40 mg, for 5 days.  Continue using your inhalers/nebulizers as needed.  I recommend staying home from work until the results of your most recent COVID test have returned.  Please return to the emergency department for signs and symptoms of severe illness including, but not limited to: New or worsening shortness of breath, cough productive of blood, chest pain, confusion, fainting, fever greater than 100.4 F.

## 2021-12-13 NOTE — ED Provider Notes (Signed)
[facility] EMERGENCY DEPARTMENT Provider Note   CSN: [id number] Arrival date & time: 12/13/21  1315     History  Chief Complaint  Patient presents with   Shortness of Breath    Mary Hanson is a 46 y.o. female with a history of asthma, obesity, who presents with several days of shortness of breath.  She thought she was having an asthma exacerbation so she went to urgent care on Friday.  She was prescribed a lower dose of prednisone than usual, 20 mg.  This was due to hyperglycemia.  She thinks it made her feel somewhat better on Saturday.  However her symptoms worsened on Sunday and were associated with cough, sore throat, head congestion.  She did 2 at home COVID tests, 1 of which was positive.  She was seen at another urgent care, rapid test for COVID and flu was negative there.  She presents to the ED today because her symptoms have not resolved, and she is more short of breath than she has been with prior asthma exacerbations.  Review of systems positive for cough production of some tan sputum, chills.  She denies chest pain, abdominal pain, nausea, vomiting.  She is sexually active.  Last menstrual period was 1 month ago.  History is notable for a trip overseas in May, after which she had some right leg discomfort which she did not think much of and has not been evaluated for.  Medical history: Asthma Elevated hemoglobin A1c for which she was treated with metformin, but not formally diagnosed with diabetes.  Has since come off metformin. 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 Authorizing Provider  predniSONE (DELTASONE) 10 MG tablet Take 4 tablets (40 mg total) by mouth daily for 5 days. 12/13/21 12/18/21 Yes Marrianne Mood, MD  albuterol  (PROVENTIL) (2.5 MG/3ML) 0.083% nebulizer solution Take 3 mLs (2.5 mg total) by nebulization every 6 (six) hours as needed for wheezing or shortness of breath. 10/11/21   Edwin Dada P, DO  albuterol (VENTOLIN HFA) 108 (90 Base) MCG/ACT inhaler Inhale 2 puffs into the lungs every 4 (four) hours as needed for wheezing or shortness of breath. 12/09/21   Bing Neighbors, FNP  amLODipine (NORVASC) 10 MG tablet Take 1 tablet (10 mg total) by mouth daily. 11/28/18   Reva Bores, MD  amoxicillin-clavulanate (AUGMENTIN) 875-125 MG tablet Take 1 tablet by mouth 2 (two) times daily.    [provider]  budesonide-formoterol (SYMBICORT) 160-4.5 MCG/ACT inhaler Inhale 2 puffs into the lungs twice a day 08/04/21     eletriptan (RELPAX) 40 MG tablet TAKE 1 TABLET BY MOUTH AS NEEDED FOR MIGRAINE OR HEADACHE. MAY REPEAT IN 2 HOURS IF HEADACHE PERSISTS OR RECURS. 10/05/21   Anson Fret, MD  fexofenadine [facility] ALLERGY) 180 MG tablet Take 1 tablet (180 mg total) by mouth daily. 01/14/19   Caro Laroche, DO  fluticasone (FLONASE) 50 MCG/ACT nasal spray Place 2 sprays into both nostrils daily for 10 days. 05/16/18 05/26/18  Shon Hale, MD  glipiZIDE (GLUCOTROL) 5 MG tablet Take 1 tablet (5 mg total) by mouth daily with supper. 12/09/21   Bing Neighbors, FNP  hydrochlorothiazide (HYDRODIURIL) 25 MG tablet Take 1 tablet by mouth every day 08/04/21  ibuprofen (ADVIL,MOTRIN) 200 MG tablet Take 800 mg by mouth every 6 (six) hours as needed for headache.    [provider]  ipratropium (ATROVENT HFA) 17 MCG/ACT inhaler Inhale 2 puffs by mouth into the lungs in the morning and at bedtime. 10/11/21   Edwin Dada P, DO  ipratropium (ATROVENT) 0.02 % nebulizer solution Take 2.5 mLs (0.5 mg total) by nebulization every 8 (eight) hours as needed for wheezing or shortness of breath. 10/11/21   Edwin Dada P, DO  methocarbamol (ROBAXIN) 500 MG tablet Take 1 tablet (500 mg total) by mouth every 8  (eight) hours as needed for muscle spasms. 12/30/20   Magnant, Charles L, PA-C  montelukast (SINGULAIR) 10 MG tablet Take 1 tablet (10 mg total) by mouth at bedtime for 30 days. 05/16/18 10/08/20  Shon Hale, MD  prochlorperazine (COMPAZINE) 10 MG tablet Take 1 tablet (10 mg total) by mouth every 6 (six) hours as needed for nausea or vomiting. 10/05/21   Anson Fret, MD  Rimegepant Sulfate (NURTEC) 75 MG TBDP Take 75 mg by mouth daily as needed. For migraines. Take as close to onset of migraine as possible. One daily maximum. 10/05/21   Anson Fret, MD      Allergies    Mobic [meloxicam], Iodine, Augmentin [amoxicillin-pot clavulanate], Doxycycline, Lisinopril, and Zofran [ondansetron hcl]    Review of Systems   Review of Systems  Respiratory:  Positive for shortness of breath.   All other systems reviewed and are negative.   Physical Exam Updated Vital Signs BP (!) 148/81   Pulse 90   Temp 97.9 F (36.6 C) (Oral)   Resp 17   LMP 11/12/2021 (Approximate)   SpO2 95%  Physical Exam Vitals and nursing note reviewed.  Constitutional:      General: She is not in acute distress.    Appearance: She is well-developed.  HENT:     Head: Normocephalic and atraumatic.  Eyes:     Conjunctiva/sclera: Conjunctivae normal.  Cardiovascular:     Rate and Rhythm: Normal rate and regular rhythm.     Heart sounds: No murmur heard. Pulmonary:     Effort: Pulmonary effort is normal. No respiratory distress.     Breath sounds: Normal breath sounds. No wheezing.  Abdominal:     Palpations: Abdomen is soft.     Tenderness: There is no abdominal tenderness.  Musculoskeletal:        General: No swelling.     Cervical back: Neck supple.     Right lower leg: No tenderness. No edema.     Left lower leg: No tenderness. No edema.  Skin:    General: Skin is warm and dry.     Capillary Refill: Capillary refill takes less than 2 seconds.  Neurological:     Mental Status: She is alert.   Psychiatric:        Mood and Affect: Mood normal.     ED Results / Procedures / Treatments   Labs (all labs ordered are listed, but only abnormal results are displayed) Labs Reviewed  BASIC METABOLIC PANEL - Abnormal; Notable for the following components:      Result Value   Potassium 3.2 (*)    Chloride 94 (*)    Glucose, Bld 106 (*)    All other components within normal limits  D-DIMER, QUANTITATIVE  CBC  PREGNANCY, URINE  TROPONIN I (HIGH SENSITIVITY)  TROPONIN I (HIGH SENSITIVITY)    EKG EKG Interpretation  Date/Time:  Tuesday  December 13 2021 13:38:49 EDT Ventricular Rate:  94 PR Interval:  150 QRS Duration: 84 QT Interval:  346 QTC Calculation: 432 R Axis:   94 Text Interpretation: Normal sinus rhythm Rightward axis Borderline ECG When compared with ECG of 25-Dec-2015 05:11, PREVIOUS ECG IS PRESENT when compared to prior, faster rate. No STEMI Confirmed by Theda Belfast ([id number]) on 12/13/2021 4:59:52 PM  Radiology DG Chest 2 View  Result Date: 12/13/2021 CLINICAL DATA:  Shortness of breath. EXAM: CHEST - 2 VIEW COMPARISON:  Chest x-ray October 12, 2018. FINDINGS: The heart size and mediastinal contours are within normal limits. Both lungs are clear. No visible pleural effusions or pneumothorax. No acute osseous abnormality. IMPRESSION: No active cardiopulmonary disease. Electronically Signed   By: Feliberto Harts M.D.   On: 12/13/2021 14:04    Procedures Procedures    Medications Ordered in ED Medications  ipratropium-albuterol (DUONEB) 0.5-2.5 (3) MG/3ML nebulizer solution 3 mL (3 mLs Nebulization Given by Other 12/13/21 1340)  ipratropium-albuterol (DUONEB) 0.5-2.5 (3) MG/3ML nebulizer solution 3 mL (3 mLs Nebulization Not Given 12/13/21 1644)    ED Course/ Medical Decision Making/ A&P                           Medical Decision Making Amount and/or Complexity of Data Reviewed Labs: ordered. Radiology: ordered.  Risk Prescription drug management.   This  patient is a 46 year old female with history of asthma.  She presents for several days of shortness of breath.  Has been tested for COVID multiple times.  1 out of 3 COVID tests were positive.  Was treated with a likely less than therapeutic dose of prednisone in the outpatient setting.  Exam is reassuring.  Patient is not wheezing.  Does not appear to be working to breathe.  Sounds a little congested, otherwise well-appearing.  My impression is that this patient suffering from an asthma exacerbation.  Is likely due to COVID-19 infection.  However she has recent history of prolonged travel with some right leg discomfort that occurred afterwards.  Index of suspicion for pulmonary embolism is low, D-dimer was negative.  Do not suspect ACS in this young woman without history of heart disease.  We will treat for mild acute asthma exacerbation discharge home with a 5-day course of prednisone 40 mg p.o.   Co morbidities that complicate the patient evaluation  Asthma Obesity   Social Determinants of Health:  Works as a Copywriter, advertising alone   Additional history obtained:  Additional history and/or information obtained from chart review External records from outside source obtained and reviewed including charts from prior healthcare encounters   Lab Tests:  I Ordered (or co-signed), and personally interpreted labs.  The pertinent results include: D-dimer within normal limits.  CBC within normal limits.  Troponin within normal limits.   Imaging Studies ordered:  I ordered (or co-signed) imaging studies including chest x-ray I independently visualized and interpreted imaging which showed no acute findings to explain the patient's clinical syndrome. I agree with the radiologist interpretation   Cardiac Monitoring:  The patient was maintained on a cardiac monitor.  The cardiac monitored showed an rhythm of normal sinus rhythm. The patient was also maintained on pulse oximetry. The readings were  typically within normal limits.   Medicines ordered and prescription drug management:  I ordered medication including DuoNeb's Reevaluation of the patient after these medicines showed that the patient improved  Reevaluation:  After the interventions noted above, I reevaluated  the patient and found that they have :improved.    Dispostion:  After consideration of the diagnostic results and the patients response to treatment, I feel that the patent would benefit from discharge home with a 5-day course of prednisone 40 mg p.o.          Final Clinical Impression(s) / ED Diagnoses Final diagnoses:  Exacerbation of asthma, unspecified asthma severity, unspecified whether persistent    Rx / DC Orders ED Discharge Orders          Ordered    predniSONE (DELTASONE) 10 MG tablet  Daily        12/13/21 Angus Seller, MD 12/13/21 2354    Tegeler, Canary Brim, MD 12/15/21 1359

## 2021-12-13 NOTE — ED Notes (Addendum)
Patient with expiratory wheezing and dyspnea on exertion. Vitals stable.

## 2021-12-13 NOTE — ED Triage Notes (Signed)
C/o shob since Friday. Seen at [facility] on Friday and prescribed steroids. Reports shob has progressivly worsened, and now pt is having difficulty performing ADLs due to shob. Denies other sx.

## 2021-12-14 ENCOUNTER — Other Ambulatory Visit (HOSPITAL_COMMUNITY): Payer: Self-pay

## 2021-12-15 ENCOUNTER — Other Ambulatory Visit (HOSPITAL_COMMUNITY): Payer: Self-pay

## 2021-12-16 ENCOUNTER — Other Ambulatory Visit (HOSPITAL_COMMUNITY): Payer: Self-pay

## 2021-12-16 ENCOUNTER — Telehealth: Payer: 59 | Admitting: Physician Assistant

## 2021-12-16 DIAGNOSIS — J4541 Moderate persistent asthma with (acute) exacerbation: Secondary | ICD-10-CM | POA: Diagnosis not present

## 2021-12-16 MED ORDER — PSEUDOEPH-BROMPHEN-DM 30-2-10 MG/5ML PO SYRP
5.0000 mL | ORAL_SOLUTION | Freq: Four times a day (QID) | ORAL | 0 refills | Status: DC | PRN
  Filled 2021-12-16: qty 120, 6d supply, fill #0

## 2021-12-16 MED ORDER — AZITHROMYCIN 250 MG PO TABS
ORAL_TABLET | ORAL | 0 refills | Status: AC
  Filled 2021-12-16: qty 6, 5d supply, fill #0

## 2021-12-16 NOTE — Patient Instructions (Signed)
Rene Kocher, thank you for joining [name], PA-C for today's virtual visit.  While this provider is not your primary care provider (PCP), if your PCP is located in our provider database this encounter information will be shared with them immediately following your visit.  Consent: (Patient) Mary Hanson provided verbal consent for this virtual visit at the beginning of the encounter.  Current Medications:  Current Outpatient Medications:    azithromycin (ZITHROMAX) 250 MG tablet, Take 2 tablets on day 1, then 1 tablet daily on days 2 through 5, Disp: 6 tablet, Rfl: 0   brompheniramine-pseudoephedrine-DM 30-2-10 MG/5ML syrup, Take 5 mLs by mouth 4 (four) times daily as needed., Disp: 120 mL, Rfl: 0   albuterol (PROVENTIL) (2.5 MG/3ML) 0.083% nebulizer solution, Take 3 mLs (2.5 mg total) by nebulization every 6 (six) hours as needed for wheezing or shortness of breath., Disp: 75 mL, Rfl: 12   albuterol (VENTOLIN HFA) 108 (90 Base) MCG/ACT inhaler, Inhale 2 puffs into the lungs every 4 (four) hours as needed for wheezing or shortness of breath., Disp: 6.7 g, Rfl: 0   amLODipine (NORVASC) 10 MG tablet, Take 1 tablet (10 mg total) by mouth daily., Disp: 90 tablet, Rfl: 3   amoxicillin-clavulanate (AUGMENTIN) 875-125 MG tablet, Take 1 tablet by mouth 2 (two) times daily., Disp: , Rfl:    budesonide-formoterol (SYMBICORT) 160-4.5 MCG/ACT inhaler, Inhale 2 puffs into the lungs twice a day, Disp: 10.2 g, Rfl: 3   eletriptan (RELPAX) 40 MG tablet, TAKE 1 TABLET BY MOUTH AS NEEDED FOR MIGRAINE OR HEADACHE. MAY REPEAT IN 2 HOURS IF HEADACHE PERSISTS OR RECURS., Disp: 9 tablet, Rfl: 11   fexofenadine (ALLEGRA ALLERGY) 180 MG tablet, Take 1 tablet (180 mg total) by mouth daily., Disp: 90 tablet, Rfl: 2   fluticasone (FLONASE) 50 MCG/ACT nasal spray, Place 2 sprays into both nostrils daily for 10 days., Disp: 16 g, Rfl: 11   glipiZIDE (GLUCOTROL) 5 MG tablet, Take 1 tablet (5 mg total) by  mouth daily with supper., Disp: 30 tablet, Rfl: 0   hydrochlorothiazide (HYDRODIURIL) 25 MG tablet, Take 1 tablet by mouth every day, Disp: 90 tablet, Rfl: 2   ibuprofen (ADVIL,MOTRIN) 200 MG tablet, Take 800 mg by mouth every 6 (six) hours as needed for headache., Disp: , Rfl:    ipratropium (ATROVENT HFA) 17 MCG/ACT inhaler, Inhale 2 puffs by mouth into the lungs in the morning and at bedtime., Disp: 12.9 g, Rfl: 12   ipratropium (ATROVENT) 0.02 % nebulizer solution, Take 2.5 mLs (0.5 mg total) by nebulization every 8 (eight) hours as needed for wheezing or shortness of breath., Disp: 75 mL, Rfl: 12   methocarbamol (ROBAXIN) 500 MG tablet, Take 1 tablet (500 mg total) by mouth every 8 (eight) hours as needed for muscle spasms., Disp: 30 tablet, Rfl: 0   montelukast (SINGULAIR) 10 MG tablet, Take 1 tablet (10 mg total) by mouth at bedtime for 30 days., Disp: 90 tablet, Rfl: 1   predniSONE (DELTASONE) 10 MG tablet, Take 4 tablets (40 mg total) by mouth daily for 5 days., Disp: 20 tablet, Rfl: 0   prochlorperazine (COMPAZINE) 10 MG tablet, Take 1 tablet (10 mg total) by mouth every 6 (six) hours as needed for nausea or vomiting., Disp: 30 tablet, Rfl: 11   Rimegepant Sulfate (NURTEC) 75 MG TBDP, Take 75 mg by mouth daily as needed. For migraines. Take as close to onset of migraine as possible. One daily maximum., Disp: 6 tablet, Rfl: 0  Medications ordered in this encounter:  Meds ordered this encounter  Medications   azithromycin (ZITHROMAX) 250 MG tablet    Sig: Take 2 tablets on day 1, then 1 tablet daily on days 2 through 5    Dispense:  6 tablet    Refill:  0    Order Specific Question:   Supervising Provider    Answer:   Hyacinth Meeker, BRIAN [[id number]]   brompheniramine-pseudoephedrine-DM 30-2-10 MG/5ML syrup    Sig: Take 5 mLs by mouth 4 (four) times daily as needed.    Dispense:  120 mL    Refill:  0    Order Specific Question:   Supervising Provider    Answer:   Hyacinth Meeker, BRIAN [[id number]]     *If  you need refills on other medications prior to your next appointment, please contact your pharmacy*  Follow-Up: Call back or seek an in-person evaluation if the symptoms worsen or if the condition fails to improve as anticipated.  Other Instructions  Asthma Triggers and Exacerbation In this video, you will learn about exposures and other factors that can lead to asthma attacks and trouble controlling asthma. To view the content, go to this web address: [email or web address]  This video will expire on: 10/15/2023. If you need access to this video following this date, please reach out to the healthcare provider who assigned it to you. This information is not intended to replace advice given to you by your health care provider. Make sure you discuss any questions you have with your health care provider. Elsevier Patient Education  2023 [employer].    If you have been instructed to have an in-person evaluation today at a local Urgent Care facility, please use the link below. It will take you to a list of all of our available [facility], including address, phone number and hours of operation. Please do not delay care.  [facility]  If you or a family member do not have a primary care provider, use the link below to schedule a visit and establish care. When you choose a [facility] primary care physician or advanced practice provider, you gain a long-term partner in health. Find a Primary Care Provider  Learn more about [facility]'s in-office and virtual care options: [facility] - Get Care Now

## 2021-12-16 NOTE — Progress Notes (Signed)
Virtual Visit Consent   Mary Hanson, you are scheduled for a virtual visit with a [facility] provider today. Just as with appointments in the office, your consent must be obtained to participate. Your consent will be active for this visit and any virtual visit you may have with one of our providers in the next 365 days. If you have a MyChart account, a copy of this consent can be sent to you electronically.  As this is a virtual visit, video technology does not allow for your provider to perform a traditional examination. This may limit your provider's ability to fully assess your condition. If your provider identifies any concerns that need to be evaluated in person or the need to arrange testing (such as labs, EKG, etc.), we will make arrangements to do so. Although advances in technology are sophisticated, we cannot ensure that it will always work on either your end or our end. If the connection with a video visit is poor, the visit may have to be switched to a telephone visit. With either a video or telephone visit, we are not always able to ensure that we have a secure connection.  By engaging in this virtual visit, you consent to the provision of healthcare and authorize for your insurance to be billed (if applicable) for the services provided during this visit. Depending on your insurance coverage, you may receive a charge related to this service.  I need to obtain your verbal consent now. Are you willing to proceed with your visit today? Mary Hanson has provided verbal consent on 12/16/2021 for a virtual visit (video or telephone). Mary Loveless, PA-C  Date: 12/16/2021 3:25 PM  Virtual Visit via Video Note   IMargaretann Hanson, connected with  Mary Hanson  ([id number], 25-Jan-1976) on 12/16/21 at  3:15 PM EDT by a video-enabled telemedicine application and verified that I am speaking with the correct person using two identifiers.  Location: Patient: Virtual Visit Location  Patient: Home Provider: Virtual Visit Location Provider: Home Office   I discussed the limitations of evaluation and management by telemedicine and the availability of in person appointments. The patient expressed understanding and agreed to proceed.    History of Present Illness: Mary Hanson is a 46 y.o. who identifies as a female who was assigned female at birth, and is being seen today for continued symptoms.  HPI: Asthma She complains of chest tightness, cough, frequent throat clearing, shortness of breath, sputum production (hard to get up though) and wheezing. There is no difficulty breathing, hemoptysis or hoarse voice. This is a new problem. The current episode started in the past 7 days. The problem occurs constantly. The problem has been gradually worsening. The cough is productive, barking, dry and harsh. Associated symptoms include dyspnea on exertion, a fever, headaches, malaise/fatigue, myalgias, nasal congestion and postnasal drip. Pertinent negatives include no appetite change, chest pain, ear congestion, ear pain, rhinorrhea, sneezing, sore throat or trouble swallowing. Her symptoms are aggravated by lying down, climbing stairs and any activity. She reports minimal improvement on treatment. Her symptoms are not alleviated by beta-agonist, rest, prescription cough suppressant, OTC cough suppressant, oral steroids, leukotriene antagonist and change in position. Her past medical history is significant for asthma.   Symptoms worsened on Sunday evening, 12/11/21, and she had a positive and a negative Covid at home test. She was then seen in person at Urgent Care on 12/12/21 due to being sent from [facility] and had a  negative Covid, flu and strep testing there. Symptoms worsened overnight and caused her to go to the ER on 12/13/21. She reports the Physician at that time told her he would treat her as she does have Covid and gave her Prednisone, Albuterol, Albuterol nebulizer,  Ipratropium nebulizer, and Symbicort. She has been using her inhalers and nebulizer treatments without relief.    Problems:  Patient Active Problem List   Diagnosis Date Noted   Type 2 diabetes mellitus without complication, without long-term current use of insulin (HCC) 11/28/2018   Moderate persistent asthma without complication 05/16/2018   Solitary pulmonary nodule 02/10/2016   Menorrhagia 05/31/2015   Peripheral neuralgia 11/11/2014   Enlarged thyroid 04/03/2013   Unspecified vitamin D deficiency 07/29/2012   External hemorrhoid 09/06/2011   Essential hypertension 12/29/2010   Morbid obesity (HCC) 12/29/2010   Allergic rhinitis 12/29/2010   Migraine headache 12/29/2010    Allergies:  Allergies  Allergen Reactions   Mobic [Meloxicam] Swelling    Swelling of arms and legs, tightness in chest   Iodine Nausea And Vomiting   Augmentin [Amoxicillin-Pot Clavulanate] Nausea And Vomiting    Pt states was nauseated due to not eating enough   Doxycycline Other (See Comments)    Headache   Lisinopril Swelling    Lip swelling/Angioedema   Zofran [Ondansetron Hcl] Other (See Comments)    Severe headache   Medications:  Current Outpatient Medications:    azithromycin (ZITHROMAX) 250 MG tablet, Take 2 tablets on day 1, then 1 tablet daily on days 2 through 5, Disp: 6 tablet, Rfl: 0   brompheniramine-pseudoephedrine-DM 30-2-10 MG/5ML syrup, Take 5 mLs by mouth 4 (four) times daily as needed., Disp: 120 mL, Rfl: 0   albuterol (PROVENTIL) (2.5 MG/3ML) 0.083% nebulizer solution, Take 3 mLs (2.5 mg total) by nebulization every 6 (six) hours as needed for wheezing or shortness of breath., Disp: 75 mL, Rfl: 12   albuterol (VENTOLIN HFA) 108 (90 Base) MCG/ACT inhaler, Inhale 2 puffs into the lungs every 4 (four) hours as needed for wheezing or shortness of breath., Disp: 6.7 g, Rfl: 0   amLODipine (NORVASC) 10 MG tablet, Take 1 tablet (10 mg total) by mouth daily., Disp: 90 tablet, Rfl: 3    amoxicillin-clavulanate (AUGMENTIN) 875-125 MG tablet, Take 1 tablet by mouth 2 (two) times daily., Disp: , Rfl:    budesonide-formoterol (SYMBICORT) 160-4.5 MCG/ACT inhaler, Inhale 2 puffs into the lungs twice a day, Disp: 10.2 g, Rfl: 3   eletriptan (RELPAX) 40 MG tablet, TAKE 1 TABLET BY MOUTH AS NEEDED FOR MIGRAINE OR HEADACHE. MAY REPEAT IN 2 HOURS IF HEADACHE PERSISTS OR RECURS., Disp: 9 tablet, Rfl: 11   fexofenadine (ALLEGRA ALLERGY) 180 MG tablet, Take 1 tablet (180 mg total) by mouth daily., Disp: 90 tablet, Rfl: 2   fluticasone (FLONASE) 50 MCG/ACT nasal spray, Place 2 sprays into both nostrils daily for 10 days., Disp: 16 g, Rfl: 11   glipiZIDE (GLUCOTROL) 5 MG tablet, Take 1 tablet (5 mg total) by mouth daily with supper., Disp: 30 tablet, Rfl: 0   hydrochlorothiazide (HYDRODIURIL) 25 MG tablet, Take 1 tablet by mouth every day, Disp: 90 tablet, Rfl: 2   ibuprofen (ADVIL,MOTRIN) 200 MG tablet, Take 800 mg by mouth every 6 (six) hours as needed for headache., Disp: , Rfl:    ipratropium (ATROVENT HFA) 17 MCG/ACT inhaler, Inhale 2 puffs by mouth into the lungs in the morning and at bedtime., Disp: 12.9 g, Rfl: 12   ipratropium (ATROVENT) 0.02 % nebulizer  solution, Take 2.5 mLs (0.5 mg total) by nebulization every 8 (eight) hours as needed for wheezing or shortness of breath., Disp: 75 mL, Rfl: 12   methocarbamol (ROBAXIN) 500 MG tablet, Take 1 tablet (500 mg total) by mouth every 8 (eight) hours as needed for muscle spasms., Disp: 30 tablet, Rfl: 0   montelukast (SINGULAIR) 10 MG tablet, Take 1 tablet (10 mg total) by mouth at bedtime for 30 days., Disp: 90 tablet, Rfl: 1   predniSONE (DELTASONE) 10 MG tablet, Take 4 tablets (40 mg total) by mouth daily for 5 days., Disp: 20 tablet, Rfl: 0   prochlorperazine (COMPAZINE) 10 MG tablet, Take 1 tablet (10 mg total) by mouth every 6 (six) hours as needed for nausea or vomiting., Disp: 30 tablet, Rfl: 11   Rimegepant Sulfate (NURTEC) 75 MG TBDP,  Take 75 mg by mouth daily as needed. For migraines. Take as close to onset of migraine as possible. One daily maximum., Disp: 6 tablet, Rfl: 0  Observations/Objective: Patient is well-developed, well-nourished in no acute distress.  Resting comfortably at home.  Head is normocephalic, atraumatic.  No labored breathing.  Speech is clear and coherent with logical content.  Patient is alert and oriented at baseline.    Assessment and Plan: 1. Moderate persistent asthma with exacerbation - azithromycin (ZITHROMAX) 250 MG tablet; Take 2 tablets on day 1, then 1 tablet daily on days 2 through 5  Dispense: 6 tablet; Refill: 0 - brompheniramine-pseudoephedrine-DM 30-2-10 MG/5ML syrup; Take 5 mLs by mouth 4 (four) times daily as needed.  Dispense: 120 mL; Refill: 0  - Worsening over a week despite medications - Will treat with Z-pack and Bromfed-DM - Can continue prednisone, inhalers and nebulizer treatments as prescribed - Push fluids.  - Rest.  - Steam and humidifier can help - Seek in person evaluation if worsening or symptoms fail to improve    Follow Up Instructions: I discussed the assessment and treatment plan with the patient. The patient was provided an opportunity to ask questions and all were answered. The patient agreed with the plan and demonstrated an understanding of the instructions.  A copy of instructions were sent to the patient via MyChart unless otherwise noted below.    The patient was advised to call back or seek an in-person evaluation if the symptoms worsen or if the condition fails to improve as anticipated.  Time:  I spent 13 minutes with the patient via telehealth technology discussing the above problems/concerns.    Mary Loveless, PA-C

## 2021-12-22 ENCOUNTER — Ambulatory Visit (INDEPENDENT_AMBULATORY_CARE_PROVIDER_SITE_OTHER): Payer: 59

## 2021-12-22 ENCOUNTER — Other Ambulatory Visit (HOSPITAL_COMMUNITY): Payer: Self-pay

## 2021-12-22 ENCOUNTER — Encounter (HOSPITAL_COMMUNITY): Payer: Self-pay

## 2021-12-22 ENCOUNTER — Ambulatory Visit (HOSPITAL_COMMUNITY)
Admission: EM | Admit: 2021-12-22 | Discharge: 2021-12-22 | Disposition: A | Discharge status: Home / Self Care | Payer: 59 | Attending: Family Medicine | Admitting: Family Medicine

## 2021-12-22 ENCOUNTER — Ambulatory Visit (HOSPITAL_COMMUNITY): Payer: 59

## 2021-12-22 DIAGNOSIS — R0602 Shortness of breath: Secondary | ICD-10-CM | POA: Diagnosis not present

## 2021-12-22 DIAGNOSIS — R051 Acute cough: Secondary | ICD-10-CM

## 2021-12-22 DIAGNOSIS — I10 Essential (primary) hypertension: Secondary | ICD-10-CM | POA: Diagnosis not present

## 2021-12-22 DIAGNOSIS — J4521 Mild intermittent asthma with (acute) exacerbation: Secondary | ICD-10-CM

## 2021-12-22 DIAGNOSIS — R062 Wheezing: Secondary | ICD-10-CM

## 2021-12-22 DIAGNOSIS — R059 Cough, unspecified: Secondary | ICD-10-CM | POA: Diagnosis not present

## 2021-12-22 DIAGNOSIS — J4540 Moderate persistent asthma, uncomplicated: Secondary | ICD-10-CM

## 2021-12-22 MED ORDER — FLUTICASONE PROPIONATE 50 MCG/ACT NA SUSP
2.0000 | Freq: Every day | NASAL | 2 refills | Status: DC
  Filled 2021-12-22: qty 16, 30d supply, fill #0

## 2021-12-22 MED ORDER — HYDROCODONE BIT-HOMATROP MBR 5-1.5 MG/5ML PO SOLN
5.0000 mL | Freq: Four times a day (QID) | ORAL | 0 refills | Status: DC | PRN
  Filled 2021-12-22: qty 90, 5d supply, fill #0

## 2021-12-22 MED ORDER — BENZONATATE 100 MG PO CAPS
100.0000 mg | ORAL_CAPSULE | Freq: Three times a day (TID) | ORAL | 0 refills | Status: DC | PRN
Start: 2021-12-22 — End: 2022-11-20
  Filled 2021-12-22: qty 21, 7d supply, fill #0

## 2021-12-22 MED ORDER — PREDNISONE 10 MG (48) PO TBPK
ORAL_TABLET | ORAL | 0 refills | Status: DC
  Filled 2021-12-22: qty 48, 12d supply, fill #0

## 2021-12-22 NOTE — ED Provider Notes (Signed)
[facility]   [id number] 12/22/21 Arrival Time: 0859  ASSESSMENT & PLAN:  1. Wheezing   2. Acute cough   3. Elevated blood pressure reading in office with diagnosis of hypertension   4. Mild intermittent asthma with exacerbation    I have personally viewed the imaging studies ordered this visit. No acute changes on CXR.  Meds ordered this encounter  Medications   HYDROcodone bit-homatropine (HYCODAN) 5-1.5 MG/5ML syrup    Sig: Take 5 mLs by mouth every 6 (six) hours as needed for cough.    Dispense:  90 mL    Refill:  0   benzonatate (TESSALON) 100 MG capsule    Sig: Take 1 capsule (100 mg total) by mouth every 8 (eight) hours as needed for cough    Dispense:  21 capsule    Refill:  0   predniSONE (STERAPRED UNI-PAK 48 TAB) 10 MG (48) TBPK tablet    Sig: Take as directed.    Dispense:  48 tablet    Refill:  0   fluticasone (FLONASE) 50 MCG/ACT nasal spray    Sig: Place 2 sprays into both nostrils daily for 10 days.    Dispense:  16 g    Refill:  2     Discharge Instructions      Be aware, your cough medication may cause drowsiness. Please do not drive, operate heavy machinery or make important decisions while on this medication, it can cloud your judgement.  Your blood pressure was noted to be elevated during your visit today. If you are currently taking medication for high blood pressure, please ensure you are taking this as directed. If you do not have a history of high blood pressure and your blood pressure remains persistently elevated, you may need to begin taking a medication at some point. You may return here within the next few days to recheck if unable to see your primary care provider or if you do not have a one.  BP (!) 164/92 (BP Location: Left Arm)   Pulse 90   Temp 98.2 F (36.8 C) (Oral)   Resp 18   LMP 12/19/2021 (Approximate)   SpO2 96%   BP Readings from Last 3 Encounters:  12/22/21 (!) 164/92  12/13/21 (!) 148/81  12/09/21 (!) 156/89           Reviewed expectations re: course of current medical issues. Questions answered. Outlined signs and symptoms indicating need for more acute intervention. Patient verbalized understanding. After Visit Summary given.   SUBJECTIVE:  History from: patient. Mary Hanson is a 46 y.o. female who reports persistent dry forceful cough causing SOB in the past few days. States has had a cough for 8 days, seen in the ED; placed on ZPAK and prednisone; some help. Completed. Afebrile. Feels she is wheezing; worse at night. Ambulatory. No orthopnea/PND. No LE edema.  Increased blood pressure noted today. Reports that she is treated for HTN.  She reports taking medications as instructed, no chest pain on exertion, and no palpitations.   Social History   Tobacco Use  Smoking Status Never  Smokeless Tobacco Never   Social History   Substance and Sexual Activity  Alcohol Use Not Currently   Comment: Wine once a year    OBJECTIVE:  Vitals:   12/22/21 1007  BP: (!) 164/92  Pulse: 90  Resp: 18  Temp: 98.2 F (36.8 C)  TempSrc: Oral  SpO2: 96%    General appearance: alert, oriented, no acute distress Eyes:  PERRLA; EOMI; conjunctivae normal HENT: normocephalic; atraumatic Neck: supple with FROM Lungs: without labored respirations; speaks full sentences without difficulty; mild to moderate bilateral exp wheezing present Heart: regular rate and rhythm without murmer Chest Wall: without tenderness to palpation Abdomen: soft, non-tender; no guarding or rebound tenderness Extremities: without edema; without calf swelling or tenderness; symmetrical without gross deformities Skin: warm and dry; without rash or lesions Neuro: normal gait Psychological: alert and cooperative; normal mood and affect   Imaging: DG Chest 2 View  Result Date: 12/22/2021 CLINICAL DATA:  cough; SOB EXAM: CHEST - 2 VIEW COMPARISON:  December 13, 2021 FINDINGS: The heart size and mediastinal  contours are within normal limits. Both lungs are clear and stable. The visualized skeletal structures are unremarkable. IMPRESSION: No active cardiopulmonary disease. Electronically Signed   By: Marjo Bicker M.D.   On: 12/22/2021 10:51     Allergies  Allergen Reactions   Mobic [Meloxicam] Swelling    Swelling of arms and legs, tightness in chest   Iodine Nausea And Vomiting   Augmentin [Amoxicillin-Pot Clavulanate] Nausea And Vomiting    Pt states was nauseated due to not eating enough   Doxycycline Other (See Comments)    Headache   Lisinopril Swelling    Lip swelling/Angioedema   Zofran [Ondansetron Hcl] Other (See Comments)    Severe headache    Past Medical History:  Diagnosis Date   Allergy-induced asthma    prn inhaler   Diabetes mellitus without complication (HCC)    History of anemia    no current problem, per pt.   Hypertension    under control with med., has been on med. x 1 yr.   Migraines    Obesity    Pyogenic granuloma 12/2013   right ring finger   Seasonal allergies    Social History   Socioeconomic History   Marital status: Single    Spouse name: Not on file   Number of children: 0   Years of education: Ba/Grad [place]   Highest education level: Not on file  Occupational History    Employer: [employer]  Tobacco Use   Smoking status: Never   Smokeless tobacco: Never  Vaping Use   Vaping Use: Never used  Substance and Sexual Activity   Alcohol use: Not Currently    Comment: Wine once a year    Drug use: No   Sexual activity: Yes    Birth control/protection: Condom, Pill    Comment: quit BCP whils on megace 7/14  Other Topics Concern   Not on file  Social History Narrative   Lives at home with herself.   Right handed.   Caffeine use: Drinks soda daily (Drinks three 20oz per day)   Social Determinants of Health   Financial Resource Strain: Not on file  Food Insecurity: Not on file  Transportation Needs: Not on file  Physical Activity: Not on  file  Stress: Not on file  Social Connections: Not on file  Intimate Partner Violence: Not on file   Family History  Problem Relation Age of Onset   Hypertension Mother    Diabetes Father    Diabetes type II Father    Hypertension Father    Stroke Father    Goiter Sister 5       identical twin   Asthma Sister    Hypertension Sister    Stroke Maternal Grandfather    Diabetes Paternal Grandfather    Heart disease Paternal Grandfather    Migraines Neg Hx  Past Surgical History:  Procedure Laterality Date   BREAST BIOPSY Right 04/24/2018   Rt br bx @  [facility]   CERVICAL CONE BIOPSY  age 57   CHOLECYSTECTOMY  2006 or 2007   DILATION AND CURETTAGE OF UTERUS     DILATION AND EVACUATION  age 57   MASS EXCISION Right 01/16/2014   Procedure: EXCISION PYOGENIC GRANULOMA FROM RIGHT RING FINGER;  Surgeon: Dairl Ponder, MD;  Location: [facility];  Service: Orthopedics;  Laterality: Right;      Mardella Layman, MD 12/22/21 1119

## 2021-12-22 NOTE — Discharge Instructions (Addendum)
Be aware, your cough medication may cause drowsiness. Please do not drive, operate heavy machinery or make important decisions while on this medication, it can cloud your judgement.  Your blood pressure was noted to be elevated during your visit today. If you are currently taking medication for high blood pressure, please ensure you are taking this as directed. If you do not have a history of high blood pressure and your blood pressure remains persistently elevated, you may need to begin taking a medication at some point. You may return here within the next few days to recheck if unable to see your primary care provider or if you do not have a one.  BP (!) 164/92 (BP Location: Left Arm)   Pulse 90   Temp 98.2 F (36.8 C) (Oral)   Resp 18   LMP 12/19/2021 (Approximate)   SpO2 96%   BP Readings from Last 3 Encounters:  12/22/21 (!) 164/92  12/13/21 (!) 148/81  12/09/21 (!) 156/89

## 2021-12-22 NOTE — ED Triage Notes (Signed)
Pt c/o dry forceful cough causing SOB in the past few days. States has had a cough for 8 days, seen in the ED and had a virtual visit. Tx'd with rx's and OTC with no relief. Completed azithromycin and prednisone on Monday.

## 2022-01-14 ENCOUNTER — Other Ambulatory Visit (HOSPITAL_COMMUNITY): Payer: Self-pay

## 2022-01-19 ENCOUNTER — Other Ambulatory Visit (HOSPITAL_COMMUNITY): Payer: Self-pay

## 2022-01-25 ENCOUNTER — Encounter (HOSPITAL_COMMUNITY): Payer: Self-pay

## 2022-01-25 ENCOUNTER — Ambulatory Visit (HOSPITAL_COMMUNITY)
Admission: EM | Admit: 2022-01-25 | Discharge: 2022-01-25 | Disposition: A | Discharge status: Home / Self Care | Payer: 59 | Attending: Internal Medicine | Admitting: Internal Medicine

## 2022-01-25 ENCOUNTER — Other Ambulatory Visit (HOSPITAL_COMMUNITY): Payer: Self-pay

## 2022-01-25 DIAGNOSIS — L0591 Pilonidal cyst without abscess: Secondary | ICD-10-CM

## 2022-01-25 DIAGNOSIS — L0501 Pilonidal cyst with abscess: Secondary | ICD-10-CM | POA: Diagnosis not present

## 2022-01-25 MED ORDER — CLINDAMYCIN HCL 300 MG PO CAPS
300.0000 mg | ORAL_CAPSULE | Freq: Three times a day (TID) | ORAL | 0 refills | Status: AC
  Filled 2022-01-25: qty 21, 7d supply, fill #0

## 2022-01-25 MED ORDER — FLUCONAZOLE 150 MG PO TABS
ORAL_TABLET | ORAL | 0 refills | Status: DC
  Filled 2022-01-25: qty 2, 4d supply, fill #0

## 2022-01-25 NOTE — ED Notes (Signed)
At bedside for Sharyn Lull, NP evaluation of abscess

## 2022-01-25 NOTE — ED Provider Notes (Signed)
[facility]    CSN: [id number] Arrival date & time: 01/25/22  1337      History   Chief Complaint Chief Complaint  Patient presents with   Abscess    HPI Mary Hanson is a 46 y.o. female.   Patient presents urgent care for evaluation of "bump" to the right side of the buttock that started 2 days ago and has grown in size/redness/tenderness over the last couple of days.  She has never experienced abscesses in the past but states that her twin sister had an abscess to her side that had to be surgically incised and drained last year.  Abscess is nondraining at this time and she has not attempted use of warm compresses to the area.  She is concerned that the area may be infected.  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  Denies painful defecation, low back pain, abdominal pain, nausea, vomiting, and recent antibiotic use.  She reports chills at home without documented fever.  Pain to the abscess area is currently a 2 on a scale of 0-10 and she has been taking Motrin over-the-counter for this with some relief.    Abscess   Past Medical History:  Diagnosis Date   Allergy-induced asthma    prn inhaler   Diabetes mellitus without complication (HCC)    History of anemia    no current problem, per pt.   Hypertension    under control with med., has been on med. x 1 yr.   Migraines    Obesity    Pyogenic granuloma 12/2013   right ring finger   Seasonal allergies     Patient Active Problem List   Diagnosis Date Noted   Type 2 diabetes mellitus without complication, without long-term current use of insulin (HCC) 11/28/2018   Moderate persistent asthma without complication 05/16/2018   Solitary pulmonary nodule 02/10/2016   Menorrhagia 05/31/2015   Peripheral neuralgia 11/11/2014   Enlarged thyroid 04/03/2013   Unspecified vitamin D deficiency 07/29/2012    External hemorrhoid 09/06/2011   Essential hypertension 12/29/2010   Morbid obesity (HCC) 12/29/2010   Allergic rhinitis 12/29/2010   Migraine headache 12/29/2010    Past Surgical History:  Procedure Laterality Date   BREAST BIOPSY Right 04/24/2018   Rt br bx @  [facility]   CERVICAL CONE BIOPSY  age 26   CHOLECYSTECTOMY  2006 or 2007   DILATION AND CURETTAGE OF UTERUS     DILATION AND EVACUATION  age 3   MASS EXCISION Right 01/16/2014   Procedure: EXCISION PYOGENIC GRANULOMA FROM RIGHT RING FINGER;  Surgeon: Dairl Ponder, MD;  Location: [facility];  Service: Orthopedics;  Laterality: Right;    OB History     Gravida  1   Para      Term      Preterm      AB  1   Living  0      SAB      IAB  1   Ectopic      Multiple      Live Births               Home Medications    Prior to Admission medications   Medication Sig Start Date End Date Taking? Authorizing Provider  clindamycin (CLEOCIN) 300 MG capsule Take 1 capsule (300 mg total) by  mouth 3 (three) times daily for 7 days. 01/25/22 02/01/22 Yes Kayron Kalmar, Stasia Cavalier, FNP  fluconazole (DIFLUCAN) 150 MG tablet Take 1 tablet (150 mg) by mouth today, then take 1 tablet in 3 days. 01/25/22  Yes Talbot Grumbling, FNP  albuterol (PROVENTIL) (2.5 MG/3ML) 0.083% nebulizer solution Take 3 mLs (2.5 mg total) by nebulization every 6 (six) hours as needed for wheezing or shortness of breath. 5/57/32   Campbell Stall P, DO  albuterol (VENTOLIN HFA) 108 (90 Base) MCG/ACT inhaler Inhale 2 puffs into the lungs every 4 (four) hours as needed for wheezing or shortness of breath. 12/09/21   Scot Jun, FNP  amLODipine (NORVASC) 10 MG tablet Take 1 tablet (10 mg total) by mouth daily. 11/28/18   Donnamae Jude, MD  benzonatate (TESSALON) 100 MG capsule Take 1 capsule (100 mg total) by mouth every 8 (eight) hours as needed for cough 12/22/21   Vanessa Kick, MD  budesonide-formoterol  [facility]) 160-4.5 MCG/ACT inhaler Inhale 2 puffs into the lungs twice a day 08/04/21     eletriptan (RELPAX) 40 MG tablet TAKE 1 TABLET BY MOUTH AS NEEDED FOR MIGRAINE OR HEADACHE. MAY REPEAT IN 2 HOURS IF HEADACHE PERSISTS OR RECURS. 10/05/21   Melvenia Beam, MD  fexofenadine [facility] ALLERGY) 180 MG tablet Take 1 tablet (180 mg total) by mouth daily. 01/14/19   Myles Gip, DO  fluticasone (FLONASE) 50 MCG/ACT nasal spray Place 2 sprays into both nostrils daily. 12/22/21   Vanessa Kick, MD  glipiZIDE (GLUCOTROL) 5 MG tablet Take 1 tablet (5 mg total) by mouth daily with supper. 12/09/21   Scot Jun, FNP  hydrochlorothiazide (HYDRODIURIL) 25 MG tablet Take 1 tablet by mouth every day 08/04/21     HYDROcodone bit-homatropine (HYCODAN) 5-1.5 MG/5ML syrup Take 5 mLs by mouth every 6 (six) hours as needed for cough. 12/22/21   Vanessa Kick, MD  ibuprofen (ADVIL,MOTRIN) 200 MG tablet Take 800 mg by mouth every 6 (six) hours as needed for headache.    [provider]  ipratropium (ATROVENT HFA) 17 MCG/ACT inhaler Inhale 2 puffs by mouth into the lungs in the morning and at bedtime. 05/26/52   Campbell Stall P, DO  ipratropium (ATROVENT) 0.02 % nebulizer solution Take 2.5 mLs (0.5 mg total) by nebulization every 8 (eight) hours as needed for wheezing or shortness of breath. 2/70/62   Campbell Stall P, DO  methocarbamol (ROBAXIN) 500 MG tablet Take 1 tablet (500 mg total) by mouth every 8 (eight) hours as needed for muscle spasms. 12/30/20   Magnant, Charles L, PA-C  montelukast (SINGULAIR) 10 MG tablet Take 1 tablet (10 mg total) by mouth at bedtime for 30 days. 05/16/18 10/08/20  Glenis Smoker, MD  predniSONE (STERAPRED UNI-PAK 48 TAB) 10 MG (48) TBPK tablet Take as directed. 12/22/21   Vanessa Kick, MD  prochlorperazine (COMPAZINE) 10 MG tablet Take 1 tablet (10 mg total) by mouth every 6 (six) hours as needed for nausea or vomiting. 10/05/21   Melvenia Beam, MD  Rimegepant Sulfate  (NURTEC) 75 MG TBDP Take 75 mg by mouth daily as needed. For migraines. Take as close to onset of migraine as possible. One daily maximum. 10/05/21   Melvenia Beam, MD    Family History Family History  Problem Relation Age of Onset   Hypertension Mother    Diabetes Father    Diabetes type II Father    Hypertension Father    Stroke Father    Goiter  Sister 70       identical twin   Asthma Sister    Hypertension Sister    Stroke Maternal Grandfather    Diabetes Paternal Grandfather    Heart disease Paternal Grandfather    Migraines Neg Hx     Social History Social History   Tobacco Use   Smoking status: Never   Smokeless tobacco: Never  Vaping Use   Vaping Use: Never used  Substance Use Topics   Alcohol use: Not Currently    Comment: Wine once a year    Drug use: No     Allergies   Mobic [meloxicam], Iodine, Augmentin [amoxicillin-pot clavulanate], Doxycycline, Lisinopril, and Zofran [ondansetron hcl]   Review of Systems Review of Systems Per HPI  Physical Exam Triage Vital Signs ED Triage Vitals  Enc Vitals Group     BP 01/25/22 1414 (!) 145/76     Pulse Rate 01/25/22 1414 78     Resp 01/25/22 1414 12     Temp 01/25/22 1414 98.3 F (36.8 C)     Temp Source 01/25/22 1414 Oral     SpO2 01/25/22 1414 98 %     Weight --      Height --      Head Circumference --      Peak Flow --      Pain Score 01/25/22 1418 2     Pain Loc --      Pain Edu? --      Excl. in GC? --    No data found.  Updated Vital Signs BP (!) 145/76 (BP Location: Right Wrist)   Pulse 78   Temp 98.3 F (36.8 C) (Oral)   Resp 12   LMP  (LMP Unknown)   SpO2 98%   Visual Acuity Right Eye Distance:   Left Eye Distance:   Bilateral Distance:    Right Eye Near:   Left Eye Near:    Bilateral Near:     Physical Exam Vitals and nursing note reviewed.  Constitutional:      Appearance: She is not ill-appearing or toxic-appearing.  HENT:     Head: Normocephalic and atraumatic.      Right Ear: Hearing and external ear normal.     Left Ear: Hearing and external ear normal.     Nose: Nose normal.     Mouth/Throat:     Lips: Pink.  Eyes:     General: Lids are normal. Vision grossly intact. Gaze aligned appropriately.     Extraocular Movements: Extraocular movements intact.     Conjunctiva/sclera: Conjunctivae normal.  Pulmonary:     Effort: Pulmonary effort is normal.  Musculoskeletal:     Cervical back: Neck supple.  Skin:    General: Skin is warm and dry.     Capillary Refill: Capillary refill takes less than 2 seconds.     Findings: Abscess present. No rash.       Neurological:     General: No focal deficit present.     Mental Status: She is alert and oriented to person, place, and time. Mental status is at baseline.     Cranial Nerves: No dysarthria or facial asymmetry.  Psychiatric:        Mood and Affect: Mood normal.        Speech: Speech normal.        Behavior: Behavior normal.        Thought Content: Thought content normal.        Judgment: Judgment normal.  UC Treatments / Results  Labs (all labs ordered are listed, but only abnormal results are displayed) Labs Reviewed - No data to display  EKG   Radiology No results found.  Procedures Procedures (including critical care time)  Medications Ordered in UC Medications - No data to display  Initial Impression / Assessment and Plan / UC Course  I have reviewed the triage vital signs and the nursing notes.  Pertinent labs & imaging results that were available during my care of the patient were reviewed by me and considered in my medical decision making (see chart for details).   1.  Infected pilonidal cyst Abscess is not ready for drainage today as there is no area of fluctuance palpable.  Patient to begin using warm compresses to the abscess and do Epsom salt soaks frequently over the next few days to soften the infected material and allow abscess to begin to drain.  She is to  begin taking clindamycin antibiotic 3 times daily with food for the next 7 days to treat infection to abscess.  Return to clinic precautions given.  Advised that this will likely need drainage, although this is not quite ready to be drained yet.  She may take Motrin over-the-counter every 6 hours as needed for pain and discomfort related to the abscess. Diflucan sent to pharmacy per patient request as she states that she frequently gets vaginal yeast infections after antibiotic use.   Discussed physical exam and available lab work findings in clinic with patient.  Counseled patient regarding appropriate use of medications and potential side effects for all medications recommended or prescribed today. Discussed red flag signs and symptoms of worsening condition,when to call the PCP office, return to urgent care, and when to seek higher level of care in the emergency department. Patient verbalizes understanding and agreement with plan. All questions answered. Patient discharged in stable condition.    Final Clinical Impressions(s) / UC Diagnoses   Final diagnoses:  Infected pilonidal cyst     Discharge Instructions      Take clindamycin by mouth with breakfast, lunch, and dinner for the next 7 days to treat infected abscess.  Perform Epsom salt soaks in the bathtub and warm compresses frequently to help the abscess becomes soft and begin to drain.  You may take Motrin over-the-counter every 6 hours as needed for pain and discomfort related to abscess.  If you notice worsening swelling, pain, or fever, please come back to urgent care for reevaluation and possible drainage of abscess. I hope you feel better!      ED Prescriptions     Medication Sig Dispense Auth. Provider   clindamycin (CLEOCIN) 300 MG capsule Take 1 capsule (300 mg total) by mouth 3 (three) times daily for 7 days. 21 capsule Reita May M, FNP   fluconazole (DIFLUCAN) 150 MG tablet Take 1 tablet (150 mg) by  mouth today, then take 1 tablet in 3 days. 2 tablet Carlisle Beers, FNP      PDMP not reviewed this encounter.   Carlisle Beers, [place] 01/25/22 [id number]

## 2022-01-25 NOTE — ED Triage Notes (Signed)
Pt has a bump near buttocks that is raised , red and painful xfew wks

## 2022-01-25 NOTE — ED Notes (Signed)
Patient noticed a medicine she expected was not called.  Notified michelle, np .  Sharyn Lull, np sent the additional medicine as expected

## 2022-01-25 NOTE — Discharge Instructions (Addendum)
Take clindamycin by mouth with breakfast, lunch, and dinner for the next 7 days to treat infected abscess.  Perform Epsom salt soaks in the bathtub and warm compresses frequently to help the abscess becomes soft and begin to drain.  You may take Motrin over-the-counter every 6 hours as needed for pain and discomfort related to abscess.  If you notice worsening swelling, pain, or fever, please come back to urgent care for reevaluation and possible drainage of abscess. I hope you feel better!

## 2022-01-26 ENCOUNTER — Other Ambulatory Visit (HOSPITAL_COMMUNITY): Payer: Self-pay

## 2022-01-26 ENCOUNTER — Ambulatory Visit: Payer: 59 | Admitting: Pulmonary Disease

## 2022-01-26 ENCOUNTER — Encounter: Payer: Self-pay | Admitting: Pulmonary Disease

## 2022-01-26 VITALS — BP 138/74 | HR 84 | Temp 98.2°F | Ht 66.0 in | Wt 327.4 lb

## 2022-01-26 DIAGNOSIS — J4550 Severe persistent asthma, uncomplicated: Secondary | ICD-10-CM

## 2022-01-26 MED ORDER — PREDNISONE 20 MG PO TABS
20.0000 mg | ORAL_TABLET | Freq: Every day | ORAL | 0 refills | Status: AC
  Filled 2022-01-26: qty 14, 14d supply, fill #0

## 2022-01-26 MED ORDER — TRELEGY ELLIPTA 200-62.5-25 MCG/ACT IN AEPB
1.0000 | INHALATION_SPRAY | Freq: Every day | RESPIRATORY_TRACT | 0 refills | Status: DC
Start: 2022-01-26 — End: 2022-01-26
  Filled 2022-01-26: qty 60, fill #0

## 2022-01-26 MED ORDER — TRELEGY ELLIPTA 200-62.5-25 MCG/ACT IN AEPB: 1.0000 | INHALATION_SPRAY | Freq: Every day | RESPIRATORY_TRACT | 0 refills | Status: DC

## 2022-01-26 MED ORDER — TRELEGY ELLIPTA 200-62.5-25 MCG/ACT IN AEPB
2.0000 | INHALATION_SPRAY | Freq: Every day | RESPIRATORY_TRACT | 3 refills | Status: DC
  Filled 2022-01-26 – 2022-04-26 (×2): qty 60, 30d supply, fill #0
  Filled 2022-06-15: qty 60, 30d supply, fill #1
  Filled 2022-09-13: qty 60, 30d supply, fill #2
  Filled 2022-11-07 – 2022-11-22 (×2): qty 60, 30d supply, fill #3

## 2022-01-26 NOTE — Patient Instructions (Addendum)
Prescription prednisone 20 -Will be a 14-day prescription, you can wean down and stop prior to 14 days if you are feeling better  Trelegy 200 -1 puff daily  Albuterol as needed  PFT on day of next visit--do not go ahead with the PFT if you are still feeling tight and wheezy  Schedule next visit for 4 to 6 weeks

## 2022-01-26 NOTE — Progress Notes (Signed)
Mary Hanson    [id number]    March 15, 1976  Primary Care Physician:Shackleford, Lovell Sheehan, MD  Referring Physician: Patrecia Pour, MD [address] [place],  [place] [address]  Chief complaint:   Asthma with recently uncontrolled symptoms  HPI:  History of adult onset asthma with recently uncontrolled symptoms  Diagnosed with COVID about 3 to 4 weeks ago  Has managed become poorly uncontrolled since the exacerbation  Diagnosed with asthma in her 64s, rarely used inhalers in the past  Has had COVID about 4 times  The last time she was tested, she actually tested negative but most of her symptoms were suggestive of COVID and she was treated for the same  History of hypertension, diabetes  On medications for allergies   Outpatient Encounter Medications as of 01/26/2022  Medication Sig   albuterol (PROVENTIL) (2.5 MG/3ML) 0.083% nebulizer solution Take 3 mLs (2.5 mg total) by nebulization every 6 (six) hours as needed for wheezing or shortness of breath.   albuterol (VENTOLIN HFA) 108 (90 Base) MCG/ACT inhaler Inhale 2 puffs into the lungs every 4 (four) hours as needed for wheezing or shortness of breath.   amLODipine (NORVASC) 10 MG tablet Take 1 tablet (10 mg total) by mouth daily.   benzonatate (TESSALON) 100 MG capsule Take 1 capsule (100 mg total) by mouth every 8 (eight) hours as needed for cough   budesonide-formoterol (SYMBICORT) 160-4.5 MCG/ACT inhaler Inhale 2 puffs into the lungs twice a day   clindamycin (CLEOCIN) 300 MG capsule Take 1 capsule (300 mg total) by mouth 3 (three) times daily for 7 days.   eletriptan (RELPAX) 40 MG tablet TAKE 1 TABLET BY MOUTH AS NEEDED FOR MIGRAINE OR HEADACHE. MAY REPEAT IN 2 HOURS IF HEADACHE PERSISTS OR RECURS.   fexofenadine (ALLEGRA ALLERGY) 180 MG tablet Take 1 tablet (180 mg total) by mouth daily.   fluconazole (DIFLUCAN) 150 MG tablet Take 1 tablet (150 mg) by mouth today, then take 1 tablet  in 3 days.   fluticasone (FLONASE) 50 MCG/ACT nasal spray Place 2 sprays into both nostrils daily.   glipiZIDE (GLUCOTROL) 5 MG tablet Take 1 tablet (5 mg total) by mouth daily with supper.   hydrochlorothiazide (HYDRODIURIL) 25 MG tablet Take 1 tablet by mouth every day   HYDROcodone bit-homatropine (HYCODAN) 5-1.5 MG/5ML syrup Take 5 mLs by mouth every 6 (six) hours as needed for cough.   ibuprofen (ADVIL,MOTRIN) 200 MG tablet Take 800 mg by mouth every 6 (six) hours as needed for headache.   ipratropium (ATROVENT HFA) 17 MCG/ACT inhaler Inhale 2 puffs by mouth into the lungs in the morning and at bedtime.   ipratropium (ATROVENT) 0.02 % nebulizer solution Take 2.5 mLs (0.5 mg total) by nebulization every 8 (eight) hours as needed for wheezing or shortness of breath.   methocarbamol (ROBAXIN) 500 MG tablet Take 1 tablet (500 mg total) by mouth every 8 (eight) hours as needed for muscle spasms.   prochlorperazine (COMPAZINE) 10 MG tablet Take 1 tablet (10 mg total) by mouth every 6 (six) hours as needed for nausea or vomiting.   Rimegepant Sulfate (NURTEC) 75 MG TBDP Take 75 mg by mouth daily as needed. For migraines. Take as close to onset of migraine as possible. One daily maximum.   montelukast (SINGULAIR) 10 MG tablet Take 1 tablet (10 mg total) by mouth at bedtime for 30 days.   predniSONE (STERAPRED UNI-PAK 48 TAB) 10 MG (48)  TBPK tablet Take as directed.   No facility-administered encounter medications on file as of 01/26/2022.    Allergies as of 01/26/2022 - Review Complete 01/26/2022  Allergen Reaction Noted   Mobic [meloxicam] Swelling 05/28/2020   Iodine Nausea And Vomiting 11/19/2014   Augmentin [amoxicillin-pot clavulanate] Nausea And Vomiting 11/11/2014   Doxycycline Other (See Comments) 12/13/2021   Lisinopril Swelling 01/11/2015   Zofran [ondansetron hcl] Other (See Comments) 01/16/2014    Past Medical History:  Diagnosis Date   Allergy-induced asthma    prn inhaler    Diabetes mellitus without complication (HCC)    History of anemia    no current problem, per pt.   Hypertension    under control with med., has been on med. x 1 yr.   Migraines    Obesity    Pyogenic granuloma 12/2013   right ring finger   Seasonal allergies     Past Surgical History:  Procedure Laterality Date   BREAST BIOPSY Right 04/24/2018   Rt br bx @  [facility]   CERVICAL CONE BIOPSY  age 34   CHOLECYSTECTOMY  2006 or 2007   DILATION AND CURETTAGE OF UTERUS     DILATION AND EVACUATION  age 24   MASS EXCISION Right 01/16/2014   Procedure: EXCISION PYOGENIC GRANULOMA FROM RIGHT RING FINGER;  Surgeon: Dairl Ponder, MD;  Location: [facility];  Service: Orthopedics;  Laterality: Right;    Family History  Problem Relation Age of Onset   Hypertension Mother    Diabetes Father    Diabetes type II Father    Hypertension Father    Stroke Father    Goiter Sister 52       identical twin   Asthma Sister    Hypertension Sister    Stroke Maternal Grandfather    Diabetes Paternal Grandfather    Heart disease Paternal Grandfather    Migraines Neg Hx     Social History   Socioeconomic History   Marital status: Single    Spouse name: Not on file   Number of children: 0   Years of education: Ba/Grad [employer]   Highest education level: Not on file  Occupational History    Employer: [employer]  Tobacco Use   Smoking status: Never   Smokeless tobacco: Never  Vaping Use   Vaping Use: Never used  Substance and Sexual Activity   Alcohol use: Not Currently    Comment: Wine once a year    Drug use: No   Sexual activity: Yes    Birth control/protection: Condom, Pill    Comment: quit BCP whils on megace 7/14  Other Topics Concern   Not on file  Social History Narrative   Lives at home with herself.   Right handed.   Caffeine use: Drinks soda daily (Drinks three 20oz per day)   Social Determinants of Health   Financial Resource Strain: Not  on file  Food Insecurity: Not on file  Transportation Needs: Not on file  Physical Activity: Not on file  Stress: Not on file  Social Connections: Not on file  Intimate Partner Violence: Not on file    Review of Systems  Respiratory:  Positive for cough and shortness of breath.     Vitals:   01/26/22 1437  BP: 138/74  Pulse: 84  Temp: 98.2 F (36.8 C)  SpO2: 96%     Physical Exam Constitutional:      Appearance: She is obese.  HENT:  Head: Normocephalic.     Mouth/Throat:     Mouth: Mucous membranes are moist.  Cardiovascular:     Rate and Rhythm: Normal rate and regular rhythm.     Heart sounds: No murmur heard.    No friction rub.  Pulmonary:     Effort: No respiratory distress.     Breath sounds: No stridor. No wheezing or rhonchi.  Musculoskeletal:     Cervical back: No rigidity or tenderness.  Neurological:     Mental Status: She is alert.  Psychiatric:        Mood and Affect: Mood normal.    Data Reviewed: Most recent chest x-ray 12/22/2021-no acute infiltrate  Assessment:  Asthma with uncontrolled symptoms -Feels Symbicort and Atrovent are not working as well  Symptoms significantly exacerbated since recent diagnosis and treatment for COVID  Class III obesity  History of hypertension  History of diabetes  Plan/Recommendations:  Switched to [employer] daily -Provide sample  Obtain PFT at next visit  Course of steroids-prednisone 20 daily to discontinue when feeling better  Follow-up in about 4 to 6 weeks  Encouraged to call with any significant concerns   Sherrilyn Rist MD [facility] 01/26/2022, 3:12 PM  CC: Shackleford, Yates Decamp*

## 2022-01-26 NOTE — Addendum Note (Signed)
Addended by: Gavin Potters R on: 01/26/2022 04:14 PM   Modules accepted: Orders

## 2022-01-27 ENCOUNTER — Other Ambulatory Visit (HOSPITAL_COMMUNITY): Payer: Self-pay

## 2022-02-06 ENCOUNTER — Other Ambulatory Visit (HOSPITAL_COMMUNITY): Payer: Self-pay

## 2022-02-06 MED ORDER — AMLODIPINE BESYLATE 10 MG PO TABS
10.0000 mg | ORAL_TABLET | Freq: Every day | ORAL | 2 refills | Status: DC
  Filled 2022-02-06: qty 90, 90d supply, fill #0

## 2022-02-07 ENCOUNTER — Other Ambulatory Visit: Payer: Self-pay

## 2022-03-14 ENCOUNTER — Ambulatory Visit: Payer: 59 | Admitting: Pulmonary Disease

## 2022-04-26 ENCOUNTER — Other Ambulatory Visit (HOSPITAL_COMMUNITY): Payer: Self-pay

## 2022-04-27 ENCOUNTER — Other Ambulatory Visit (HOSPITAL_COMMUNITY): Payer: Self-pay

## 2022-05-03 ENCOUNTER — Encounter: Payer: Self-pay | Admitting: Pulmonary Disease

## 2022-05-03 ENCOUNTER — Ambulatory Visit: Payer: Commercial Managed Care - PPO | Admitting: Pulmonary Disease

## 2022-05-03 ENCOUNTER — Ambulatory Visit: Payer: Self-pay | Admitting: Pulmonary Disease

## 2022-05-03 VITALS — BP 122/76 | HR 81 | Ht 66.0 in | Wt 320.0 lb

## 2022-05-03 DIAGNOSIS — J4550 Severe persistent asthma, uncomplicated: Secondary | ICD-10-CM | POA: Diagnosis not present

## 2022-05-03 LAB — PULMONARY FUNCTION TEST
DL/VA % pred: 145 %
DL/VA: 6.25 ml/min/mmHg/L
DLCO cor % pred: 116 %
DLCO cor: 26.54 ml/min/mmHg
DLCO unc % pred: 116 %
DLCO unc: 26.54 ml/min/mmHg
FEF 25-75 Post: 1.19 L/sec
FEF 25-75 Pre: 0.77 L/sec
FEF2575-%Change-Post: 53 %
FEF2575-%Pred-Post: 39 %
FEF2575-%Pred-Pre: 25 %
FEV1-%Change-Post: 13 %
FEV1-%Pred-Post: 53 %
FEV1-%Pred-Pre: 47 %
FEV1-Post: 1.67 L
FEV1-Pre: 1.46 L
FEV1FVC-%Change-Post: 8 %
FEV1FVC-%Pred-Pre: 79 %
FEV6-%Change-Post: 5 %
FEV6-%Pred-Post: 63 %
FEV6-%Pred-Pre: 60 %
FEV6-Post: 2.4 L
FEV6-Pre: 2.28 L
FEV6FVC-%Pred-Post: 102 %
FEV6FVC-%Pred-Pre: 102 %
FVC-%Change-Post: 5 %
FVC-%Pred-Post: 62 %
FVC-%Pred-Pre: 59 %
FVC-Post: 2.4 L
FVC-Pre: 2.28 L
Post FEV1/FVC ratio: 69 %
Post FEV6/FVC ratio: 100 %
Pre FEV1/FVC ratio: 64 %
Pre FEV6/FVC Ratio: 100 %
RV % pred: 132 %
RV: 2.39 L
TLC % pred: 93 %
TLC: 5.02 L

## 2022-05-03 NOTE — Progress Notes (Signed)
Full PFT performed today. °

## 2022-05-03 NOTE — Patient Instructions (Signed)
Full PFT performed today. °

## 2022-05-03 NOTE — Patient Instructions (Signed)
Continue Trelegy daily  Repeat PFT in 6 months  Follow-up appointment in 6 months  Call with significant concerns

## 2022-05-03 NOTE — Progress Notes (Signed)
Mary Hanson    [id number]    02-Feb-1976  Primary Care Physician:Shackleford, Vicie Mutters, MD  Referring Physician: Aaron Edelman, [place],  [place] [address]  Chief complaint:   Asthma with recently uncontrolled symptoms  HPI:  History of adult onset asthma with recently uncontrolled symptoms  Symptoms are better controlled with Trelegy  Recent diagnosis of COVID, has not COVID 3 times previously  Asthma is better controlled with Trelegy  Diagnosed with asthma in her 47s, rarely used inhalers in the past  The last time she was tested, she actually tested negative but most of her symptoms were suggestive of COVID and she was treated for the same  History of hypertension, diabetes  On medications for allergies   Outpatient Encounter Medications as of 05/03/2022  Medication Sig   albuterol (PROVENTIL) (2.5 MG/3ML) 0.083% nebulizer solution Take 3 mLs (2.5 mg total) by nebulization every 6 (six) hours as needed for wheezing or shortness of breath.   albuterol (VENTOLIN HFA) 108 (90 Base) MCG/ACT inhaler Inhale 2 puffs into the lungs every 4 (four) hours as needed for wheezing or shortness of breath.   amLODipine (NORVASC) 10 MG tablet Take 1 tablet (10 mg total) by mouth daily.   eletriptan (RELPAX) 40 MG tablet TAKE 1 TABLET BY MOUTH AS NEEDED FOR MIGRAINE OR HEADACHE. MAY REPEAT IN 2 HOURS IF HEADACHE PERSISTS OR RECURS.   fexofenadine (ALLEGRA ALLERGY) 180 MG tablet Take 1 tablet (180 mg total) by mouth daily.   fluconazole (DIFLUCAN) 150 MG tablet Take 1 tablet (150 mg) by mouth today, then take 1 tablet in 3 days.   fluticasone (FLONASE) 50 MCG/ACT nasal spray Place 2 sprays into both nostrils daily.   Fluticasone-Umeclidin-Vilant (TRELEGY ELLIPTA) 200-62.5-25 MCG/ACT AEPB Inhale 2 puffs into the lungs daily.   glipiZIDE (GLUCOTROL) 5 MG tablet Take 1 tablet (5 mg total) by mouth daily with supper.   hydrochlorothiazide  (HYDRODIURIL) 25 MG tablet Take 1 tablet by mouth every day   ibuprofen (ADVIL,MOTRIN) 200 MG tablet Take 800 mg by mouth every 6 (six) hours as needed for headache.   ipratropium (ATROVENT) 0.02 % nebulizer solution Take 2.5 mLs (0.5 mg total) by nebulization every 8 (eight) hours as needed for wheezing or shortness of breath.   prochlorperazine (COMPAZINE) 10 MG tablet Take 1 tablet (10 mg total) by mouth every 6 (six) hours as needed for nausea or vomiting.   Rimegepant Sulfate (NURTEC) 75 MG TBDP Take 75 mg by mouth daily as needed. For migraines. Take as close to onset of migraine as possible. One daily maximum.   amLODipine (NORVASC) 10 MG tablet Take 1 tablet (10 mg total) by mouth daily. (Patient not taking: Reported on 05/03/2022)   benzonatate (TESSALON) 100 MG capsule Take 1 capsule (100 mg total) by mouth every 8 (eight) hours as needed for cough (Patient not taking: Reported on 05/03/2022)   Fluticasone-Umeclidin-Vilant (TRELEGY ELLIPTA) 200-62.5-25 MCG/ACT AEPB Inhale 1 puff into the lungs daily. (Patient not taking: Reported on 05/03/2022)   HYDROcodone bit-homatropine (HYCODAN) 5-1.5 MG/5ML syrup Take 5 mLs by mouth every 6 (six) hours as needed for cough. (Patient not taking: Reported on 05/03/2022)   methocarbamol (ROBAXIN) 500 MG tablet Take 1 tablet (500 mg total) by mouth every 8 (eight) hours as needed for muscle spasms. (Patient not taking: Reported on 05/03/2022)   montelukast (SINGULAIR) 10 MG tablet Take 1 tablet (10 mg total) by mouth  at bedtime for 30 days.   predniSONE (STERAPRED UNI-PAK 48 TAB) 10 MG (48) TBPK tablet Take as directed. (Patient not taking: Reported on 05/03/2022)   No facility-administered encounter medications on file as of 05/03/2022.    Allergies as of 05/03/2022 - Review Complete 05/03/2022  Allergen Reaction Noted   Mobic [meloxicam] Swelling 05/28/2020   Iodine Nausea And Vomiting 11/19/2014   Augmentin [amoxicillin-pot clavulanate] Nausea And Vomiting  11/11/2014   Doxycycline Other (See Comments) 12/13/2021   Lisinopril Swelling 01/11/2015   Zofran [ondansetron hcl] Other (See Comments) 01/16/2014    Past Medical History:  Diagnosis Date   Allergy-induced asthma    prn inhaler   Diabetes mellitus without complication ([place])    History of anemia    no current problem, per pt.   Hypertension    under control with med., has been on med. x 1 yr.   Migraines    Obesity    Pyogenic granuloma 12/2013   right ring finger   Seasonal allergies     Past Surgical History:  Procedure Laterality Date   BREAST BIOPSY Right 04/24/2018   Rt br bx @  [facility]  age 47   CHOLECYSTECTOMY  2006 or 2007   DILATION AND CURETTAGE OF UTERUS     DILATION AND EVACUATION  age 47   MASS EXCISION Right 01/16/2014   Procedure: EXCISION PYOGENIC GRANULOMA FROM RIGHT RING FINGER;  Surgeon: Charlotte Crumb, MD;  Location: [facility];  Service: Orthopedics;  Laterality: Right;    Family History  Problem Relation Age of Onset   Hypertension Mother    Diabetes Father    Diabetes type II Father    Hypertension Father    Stroke Father    Goiter Sister 13       identical twin   Asthma Sister    Hypertension Sister    Stroke Maternal Grandfather    Diabetes Paternal Grandfather    Heart disease Paternal Grandfather    Migraines Neg Hx     Social History   Socioeconomic History   Marital status: Single    Spouse name: Not on file   Number of children: 0   Years of education: Ba/Grad [employer]   Highest education level: Not on file  Occupational History    Employer: [employer]  Tobacco Use   Smoking status: Never   Smokeless tobacco: Never  Vaping Use   Vaping Use: Never used  Substance and Sexual Activity   Alcohol use: Not Currently    Comment: Wine once a year    Drug use: No   Sexual activity: Yes    Birth control/protection: Condom, Pill    Comment: quit BCP whils on megace 7/14   Other Topics Concern   Not on file  Social History Narrative   Lives at home with herself.   Right handed.   Caffeine use: Drinks soda daily (Drinks three 20oz per day)   Social Determinants of Health   Financial Resource Strain: Not on file  Food Insecurity: Not on file  Transportation Needs: Not on file  Physical Activity: Not on file  Stress: Not on file  Social Connections: Not on file  Intimate Partner Violence: Not on file    Review of Systems  Respiratory:  Positive for cough and shortness of breath.     Vitals:   05/03/22 1034  BP: 122/76  Pulse: 81  SpO2: 98%     Physical  Exam Constitutional:      Appearance: She is obese.  HENT:     Head: Normocephalic.     Mouth/Throat:     Mouth: Mucous membranes are moist.  Cardiovascular:     Rate and Rhythm: Normal rate and regular rhythm.     Heart sounds: No murmur heard.    No friction rub.  Pulmonary:     Effort: No respiratory distress.     Breath sounds: No stridor. No wheezing or rhonchi.  Musculoskeletal:     Cervical back: No rigidity or tenderness.  Neurological:     Mental Status: She is alert.  Psychiatric:        Mood and Affect: Mood normal.    Data Reviewed: Most recent chest x-ray 12/22/2021-no acute infiltrate  PFT with severe obstructive disease with significant bronchodilator response  Assessment:  Asthma with uncontrolled symptoms -Doing better with Trelegy -We will stay the course with Trelegy  Class III obesity  History of hypertension  History of diabetes  Plan/Recommendations:  Continue Trelegy  Weight loss efforts encouraged  Repeat PFT in 6 months  Encouraged to call with significant concerns  She will be starting a gym routine   Virl Diamond MD [facility] 05/03/2022, 10:57 AM  CC: Shackleford, [place]*

## 2022-05-03 NOTE — Addendum Note (Signed)
Addended by: June Leap on: 05/03/2022 11:40 AM   Modules accepted: Orders

## 2022-05-25 ENCOUNTER — Ambulatory Visit (INDEPENDENT_AMBULATORY_CARE_PROVIDER_SITE_OTHER): Payer: Commercial Managed Care - PPO

## 2022-05-25 ENCOUNTER — Encounter: Payer: Self-pay | Admitting: Orthopedic Surgery

## 2022-05-25 ENCOUNTER — Ambulatory Visit: Payer: Commercial Managed Care - PPO | Admitting: Orthopedic Surgery

## 2022-05-25 DIAGNOSIS — M25561 Pain in right knee: Secondary | ICD-10-CM

## 2022-05-25 DIAGNOSIS — M25562 Pain in left knee: Secondary | ICD-10-CM | POA: Diagnosis not present

## 2022-05-25 NOTE — Progress Notes (Signed)
Office Visit Note   Patient: Mary Hanson           Date of Birth: 22-Nov-1975           MRN: [id number] Visit Date: 05/25/2022 Requested by: [place], Vicie Mutters, [place],   [address] PCP: Sheppard Coil, Vicie Mutters, MD  Subjective: Chief Complaint  Patient presents with   Right Knee - Pain   Left Knee - Pain    HPI: Mary Hanson is a 47 y.o. female who presents to the office reporting bilateral knee pain right worse than left.  She had a fall 3 weeks ago where she landed directly on to both knees.  Been taking Tylenol and ibuprofen.  Using Ace wrap which helps.  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  Denies any instability..                ROS: All systems reviewed are negative as they relate to the chief complaint within the history of present illness.  Patient denies fevers or chills.  Assessment & Plan: Visit Diagnoses:  1. Pain in both knees, unspecified chronicity     Plan: Impression is deep bone bruise with no effusion and normal radiographs on exam today.  Plan is continue conservative treatment.  Ice the knee a couple times a day.  Continue with Ace wrap for her to 12-hour shifts that she works.  If she is no better in 3 weeks then we could consider further imaging but no fracture or stable ligaments and no effusion on exam today.  Follow-Up Instructions: No follow-ups on file.   Orders:  Orders Placed This Encounter  Procedures   XR KNEE 3 VIEW RIGHT   XR Knee 1-2 Views Left   No orders of the defined types were placed in this encounter.     Procedures: No procedures performed   Clinical Data: No additional findings.  Objective: Vital Signs: There were no vitals taken for this visit.  Physical Exam:  Constitutional: Patient appears well-developed HEENT:  Head: Normocephalic Eyes:EOM are normal Neck: Normal range of motion Cardiovascular: Normal  rate Pulmonary/chest: Effort normal Neurologic: Patient is alert Skin: Skin is warm Psychiatric: Patient has normal mood and affect  Ortho Exam: Ortho exam demonstrates normal gait alignment.  No effusion in either knee.  Extensor mechanism intact bilaterally.  No groin pain with internal/external rotation of either leg.  Pedal pulses palpable.  Has medial greater than lateral joint line tenderness on the right-hand side.  No crepitus.  No other masses lymphadenopathy or skin changes noted in either knee region.  Specialty Comments:  No specialty comments available.  Imaging: XR Knee 1-2 Views Left  Result Date: 05/25/2022 AP lateral merchant radiographs left knee reviewed.  No acute fracture.  Alignment intact.  No arthritis.  Alignment intact  XR KNEE 3 VIEW RIGHT  Result Date: 05/25/2022 AP lateral merchant radiographs right knee reviewed.  No acute fracture.  No arthritis.  Alignment intact.  Slight lateral spur seen on the patella on the merchant view.    PMFS History: Patient Active Problem List   Diagnosis Date Noted   Type 2 diabetes mellitus without complication, without long-term current use of insulin ([place]) 11/28/2018   Moderate persistent asthma without complication 25/42/7062   Solitary pulmonary nodule 02/10/2016   Menorrhagia 05/31/2015   Peripheral neuralgia 11/11/2014   Enlarged thyroid 04/03/2013   Unspecified vitamin D deficiency  07/29/2012   External hemorrhoid 09/06/2011   Essential hypertension 12/29/2010   Morbid obesity ([place]) 12/29/2010   Allergic rhinitis 12/29/2010   Migraine headache 12/29/2010   Past Medical History:  Diagnosis Date   Allergy-induced asthma    prn inhaler   Diabetes mellitus without complication ([place])    History of anemia    no current problem, per pt.   Hypertension    under control with med., has been on med. x 1 yr.   Migraines    Obesity    Pyogenic granuloma 12/2013   right ring finger   Seasonal allergies     Family  History  Problem Relation Age of Onset   Hypertension Mother    Diabetes Father    Diabetes type II Father    Hypertension Father    Stroke Father    Goiter Sister 72       identical twin   Asthma Sister    Hypertension Sister    Stroke Maternal Grandfather    Diabetes Paternal Grandfather    Heart disease Paternal Grandfather    Migraines Neg Hx     Past Surgical History:  Procedure Laterality Date   BREAST BIOPSY Right 04/24/2018   Rt br bx @  [place] [facility]SY  age 44   CHOLECYSTECTOMY  2006 or 2007   DILATION AND CURETTAGE OF UTERUS     DILATION AND EVACUATION  age 63   MASS EXCISION Right 01/16/2014   Procedure: EXCISION PYOGENIC GRANULOMA FROM RIGHT RING FINGER;  Surgeon: Charlotte Crumb, MD;  Location: [facility];  Service: Orthopedics;  Laterality: Right;   Social History   Occupational History    Employer: [employer]  Tobacco Use   Smoking status: Never   Smokeless tobacco: Never  Vaping Use   Vaping Use: Never used  Substance and Sexual Activity   Alcohol use: Not Currently    Comment: Wine once a year    Drug use: No   Sexual activity: Yes    Birth control/protection: Condom, Pill    Comment: quit BCP whils on megace 7/14

## 2022-05-30 ENCOUNTER — Other Ambulatory Visit (HOSPITAL_COMMUNITY): Payer: Self-pay

## 2022-05-30 DIAGNOSIS — M7661 Achilles tendinitis, right leg: Secondary | ICD-10-CM | POA: Diagnosis not present

## 2022-05-30 DIAGNOSIS — M7662 Achilles tendinitis, left leg: Secondary | ICD-10-CM | POA: Diagnosis not present

## 2022-05-30 MED ORDER — METHYLPREDNISOLONE 4 MG PO TBPK
ORAL_TABLET | ORAL | 0 refills | Status: DC
  Filled 2022-05-30 – 2022-06-07 (×2): qty 21, 6d supply, fill #0

## 2022-06-06 ENCOUNTER — Other Ambulatory Visit: Payer: Self-pay | Admitting: Family Medicine

## 2022-06-06 ENCOUNTER — Encounter: Payer: Self-pay | Admitting: Family Medicine

## 2022-06-06 ENCOUNTER — Other Ambulatory Visit (HOSPITAL_COMMUNITY): Payer: Self-pay

## 2022-06-06 DIAGNOSIS — N921 Excessive and frequent menstruation with irregular cycle: Secondary | ICD-10-CM

## 2022-06-07 ENCOUNTER — Other Ambulatory Visit (HOSPITAL_COMMUNITY): Payer: Self-pay

## 2022-06-07 ENCOUNTER — Other Ambulatory Visit: Payer: Self-pay | Admitting: Family Medicine

## 2022-06-07 DIAGNOSIS — N921 Excessive and frequent menstruation with irregular cycle: Secondary | ICD-10-CM

## 2022-06-08 ENCOUNTER — Other Ambulatory Visit: Payer: Self-pay | Admitting: *Deleted

## 2022-06-08 ENCOUNTER — Other Ambulatory Visit (HOSPITAL_COMMUNITY): Payer: Self-pay

## 2022-06-08 ENCOUNTER — Encounter: Payer: Self-pay | Admitting: Family Medicine

## 2022-06-08 MED ORDER — MEGESTROL ACETATE 40 MG PO TABS
40.0000 mg | ORAL_TABLET | Freq: Two times a day (BID) | ORAL | 2 refills | Status: DC
  Filled 2022-06-08: qty 30, 15d supply, fill #0

## 2022-06-15 ENCOUNTER — Other Ambulatory Visit (HOSPITAL_COMMUNITY): Payer: Self-pay

## 2022-06-25 ENCOUNTER — Other Ambulatory Visit (HOSPITAL_COMMUNITY): Payer: Self-pay

## 2022-06-25 MED ORDER — HYDROCHLOROTHIAZIDE 25 MG PO TABS
25.0000 mg | ORAL_TABLET | Freq: Every day | ORAL | 2 refills | Status: DC
  Filled 2022-06-25 – 2022-11-22 (×3): qty 90, 90d supply, fill #0
  Filled 2023-05-02: qty 90, 90d supply, fill #1

## 2022-06-26 ENCOUNTER — Other Ambulatory Visit (HOSPITAL_COMMUNITY): Payer: Self-pay

## 2022-07-04 ENCOUNTER — Other Ambulatory Visit (HOSPITAL_COMMUNITY): Payer: Self-pay

## 2022-07-04 MED ORDER — AMLODIPINE BESYLATE 10 MG PO TABS
10.0000 mg | ORAL_TABLET | Freq: Every day | ORAL | 3 refills | Status: DC
  Filled 2022-07-04: qty 30, 30d supply, fill #0
  Filled 2022-08-17: qty 30, 30d supply, fill #1
  Filled 2022-09-13: qty 30, 30d supply, fill #2
  Filled 2022-11-07 – 2022-11-22 (×2): qty 30, 30d supply, fill #3

## 2022-07-05 ENCOUNTER — Other Ambulatory Visit: Payer: Self-pay

## 2022-07-26 ENCOUNTER — Ambulatory Visit
Admission: EM | Admit: 2022-07-26 | Discharge: 2022-07-26 | Disposition: A | Discharge status: Home / Self Care | Payer: Commercial Managed Care - PPO | Attending: Nurse Practitioner | Admitting: Nurse Practitioner

## 2022-07-26 ENCOUNTER — Ambulatory Visit (INDEPENDENT_AMBULATORY_CARE_PROVIDER_SITE_OTHER): Payer: Commercial Managed Care - PPO

## 2022-07-26 ENCOUNTER — Other Ambulatory Visit: Payer: Self-pay

## 2022-07-26 ENCOUNTER — Encounter: Payer: Self-pay | Admitting: Emergency Medicine

## 2022-07-26 DIAGNOSIS — M79672 Pain in left foot: Secondary | ICD-10-CM | POA: Diagnosis not present

## 2022-07-26 DIAGNOSIS — W19XXXA Unspecified fall, initial encounter: Secondary | ICD-10-CM

## 2022-07-26 DIAGNOSIS — S9032XA Contusion of left foot, initial encounter: Secondary | ICD-10-CM

## 2022-07-26 DIAGNOSIS — M7989 Other specified soft tissue disorders: Secondary | ICD-10-CM | POA: Diagnosis not present
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
## 2022-07-26 NOTE — Discharge Instructions (Signed)
The foot xray today does not show any broken bones  Please rest your foot and ice apply ice 15 minutes on, 45 minutes off every hour while awake  Keep your foot elevated while you are sitting down  Follow up with Podiatry or Orthopedic provider for persistent or worsening symptoms despite treatment
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
## 2022-08-03 ENCOUNTER — Other Ambulatory Visit (HOSPITAL_COMMUNITY): Payer: Self-pay

## 2022-08-03 MED ORDER — METFORMIN HCL ER (MOD) 1000 MG PO TB24
1000.0000 mg | ORAL_TABLET | Freq: Every day | ORAL | 2 refills | Status: DC
  Filled 2022-08-03: qty 30, 30d supply, fill #0

## 2022-08-03 MED ORDER — METFORMIN HCL ER 500 MG PO TB24
ORAL_TABLET | ORAL | 2 refills | Status: DC
  Filled 2022-08-03: qty 60, 30d supply, fill #0
  Filled 2023-02-22: qty 60, 30d supply, fill #1

## 2022-08-14 ENCOUNTER — Ambulatory Visit: Payer: Commercial Managed Care - PPO | Admitting: Family Medicine

## 2022-08-14 ENCOUNTER — Encounter: Payer: Self-pay | Admitting: Family Medicine

## 2022-08-14 VITALS — BP 160/98 | HR 99 | Wt 313.0 lb

## 2022-08-14 DIAGNOSIS — N921 Excessive and frequent menstruation with irregular cycle: Secondary | ICD-10-CM | POA: Diagnosis not present

## 2022-08-14 NOTE — Progress Notes (Signed)
Patient here with complaints of abnormal/ heavy bleeding appt was originally made 6-8wks ago. No longer having breakthrough bleeding.  Notes discussion via Mychart w/ provider. Notes reg cycle after taking Rx as directed has not had anymore issues.  LMP: 07/26/22

## 2022-08-14 NOTE — Assessment & Plan Note (Signed)
Resolved with stress reduction - has megace if needed.

## 2022-08-14 NOTE — Progress Notes (Signed)
    Subjective:    Patient ID: Mary Hanson is a 47 y.o. female presenting with Vaginal Bleeding  on 08/14/2022  HPI: Had normal cycles fro a few months. Then developed some breakthrough bleeding,took Megace and she had a lot of stress. Took Megace and it helped. Now back to normal cycles.  Review of Systems  Constitutional:  Negative for chills and fever.  Respiratory:  Negative for shortness of breath.   Cardiovascular:  Negative for chest pain.  Gastrointestinal:  Negative for abdominal pain, nausea and vomiting.  Genitourinary:  Negative for dysuria.  Skin:  Negative for rash.      Objective:    BP (!) 174/73   Pulse 99   Wt (!) 313 lb (142 kg)   LMP 07/26/2022 (Approximate)   BMI 50.52 kg/m  Physical Exam Exam conducted with a chaperone present.  Constitutional:      General: She is not in acute distress.    Appearance: She is well-developed.  HENT:     Head: Normocephalic and atraumatic.  Eyes:     General: No scleral icterus. Cardiovascular:     Rate and Rhythm: Normal rate.  Pulmonary:     Effort: Pulmonary effort is normal.  Abdominal:     Palpations: Abdomen is soft.  Musculoskeletal:     Cervical back: Neck supple.  Skin:    General: Skin is warm and dry.  Neurological:     Mental Status: She is alert and oriented to person, place, and time.         Assessment & Plan:  Menorrhagia with irregular cycle  Return if symptoms worsen or fail to improve.  Reva Bores, MD 08/14/2022 1:56 PM

## 2022-08-15 ENCOUNTER — Telehealth: Payer: Self-pay | Admitting: Neurology

## 2022-08-15 NOTE — Telephone Encounter (Signed)
6/17 appointment moved to 6/20 due to MD template change.

## 2022-08-16 ENCOUNTER — Other Ambulatory Visit (HOSPITAL_COMMUNITY): Payer: Self-pay

## 2022-08-16 DIAGNOSIS — M79672 Pain in left foot: Secondary | ICD-10-CM | POA: Diagnosis not present

## 2022-08-16 DIAGNOSIS — M7672 Peroneal tendinitis, left leg: Secondary | ICD-10-CM | POA: Diagnosis not present

## 2022-08-16 MED ORDER — METHYLPREDNISOLONE 4 MG PO TBPK
ORAL_TABLET | ORAL | 0 refills | Status: DC
  Filled 2022-08-16: qty 21, 6d supply, fill #0

## 2022-08-22 ENCOUNTER — Other Ambulatory Visit (HOSPITAL_COMMUNITY): Payer: Self-pay

## 2022-08-22 ENCOUNTER — Telehealth: Payer: Commercial Managed Care - PPO | Admitting: Physician Assistant

## 2022-08-22 ENCOUNTER — Other Ambulatory Visit: Payer: Self-pay

## 2022-08-22 DIAGNOSIS — B9689 Other specified bacterial agents as the cause of diseases classified elsewhere: Secondary | ICD-10-CM | POA: Diagnosis not present

## 2022-08-22 DIAGNOSIS — J019 Acute sinusitis, unspecified: Secondary | ICD-10-CM | POA: Diagnosis not present

## 2022-08-22 MED ORDER — FLUTICASONE PROPIONATE 50 MCG/ACT NA SUSP
2.0000 | Freq: Every day | NASAL | 0 refills | Status: DC
Start: 2022-08-22 — End: 2022-11-20
  Filled 2022-08-22: qty 16, 30d supply, fill #0

## 2022-08-22 MED ORDER — AZITHROMYCIN 250 MG PO TABS
ORAL_TABLET | ORAL | 0 refills | Status: AC
Start: 2022-08-22 — End: 2022-08-27
  Filled 2022-08-22: qty 6, 5d supply, fill #0

## 2022-08-22 MED ORDER — PROMETHAZINE-DM 6.25-15 MG/5ML PO SYRP
5.0000 mL | ORAL_SOLUTION | Freq: Four times a day (QID) | ORAL | 0 refills | Status: DC | PRN
Start: 2022-08-22 — End: 2022-11-20
  Filled 2022-08-22: qty 118, 6d supply, fill #0

## 2022-08-22 NOTE — Progress Notes (Signed)
Virtual Visit Consent   Mary Hanson, you are scheduled for a virtual visit with a [facility] provider today. Just as with appointments in the office, your consent must be obtained to participate. Your consent will be active for this visit and any virtual visit you may have with one of our providers in the next 365 days. If you have a MyChart account, a copy of this consent can be sent to you electronically.  As this is a virtual visit, video technology does not allow for your provider to perform a traditional examination. This may limit your provider's ability to fully assess your condition. If your provider identifies any concerns that need to be evaluated in person or the need to arrange testing (such as labs, EKG, etc.), we will make arrangements to do so. Although advances in technology are sophisticated, we cannot ensure that it will always work on either your end or our end. If the connection with a video visit is poor, the visit may have to be switched to a telephone visit. With either a video or telephone visit, we are not always able to ensure that we have a secure connection.  By engaging in this virtual visit, you consent to the provision of healthcare and authorize for your insurance to be billed (if applicable) for the services provided during this visit. Depending on your insurance coverage, you may receive a charge related to this service.  I need to obtain your verbal consent now. Are you willing to proceed with your visit today? Mary Hanson has provided verbal consent on 08/22/2022 for a virtual visit (video or telephone). Mary Loveless, PA-C  Date: 08/22/2022 4:35 PM  Virtual Visit via Video Note   IMargaretann Hanson, connected with  Mary Hanson  ([id number], 07/04/1975) on 08/22/22 at  4:30 PM EDT by a video-enabled telemedicine application and verified that I am speaking with the correct person using two identifiers.  Location: Patient: Virtual Visit Location  Patient: Home Provider: Virtual Visit Location Provider: Home Office   I discussed the limitations of evaluation and management by telemedicine and the availability of in person appointments. The patient expressed understanding and agreed to proceed.    History of Present Illness: Mary Hanson is a 47 y.o. who identifies as a female who was assigned female at birth, and is being seen today for URI/Flu-like symptoms.  HPI: URI  This is a new problem. The current episode started in the past 7 days. The problem has been gradually worsening. Maximum temperature: subjective fever overnight. The fever has been present for Less than 1 day. Associated symptoms include chest pain, congestion, coughing, ear pain, headaches, nausea, a plugged ear sensation, rhinorrhea (post nasal drainage), sinus pain, a sore throat and swollen glands (tonsils). Pertinent negatives include no diarrhea, neck pain, vomiting or wheezing. Associated symptoms comments: Myalgias, general malaise, sweats. Treatments tried: vit c, allegra, cold and flu combination medication, on prednisone for foot issue. The treatment provided no relief.   Covid 19 at home testing is negative No known exposures, but has been around children; 68 yr old nephew diagnosed with ear infection yesterday.   Problems:  Patient Active Problem List   Diagnosis Date Noted   Type 2 diabetes mellitus without complication, without long-term current use of insulin (HCC) 11/28/2018   Moderate persistent asthma without complication 05/16/2018   Solitary pulmonary nodule 02/10/2016   Menorrhagia 05/31/2015   Peripheral neuralgia 11/11/2014   Enlarged thyroid 04/03/2013   Unspecified  vitamin D deficiency 07/29/2012   External hemorrhoid 09/06/2011   Essential hypertension 12/29/2010   Morbid obesity (HCC) 12/29/2010   Allergic rhinitis 12/29/2010   Migraine headache 12/29/2010    Allergies:  Allergies  Allergen Reactions   Mobic [Meloxicam] Swelling     Swelling of arms and legs, tightness in chest   Iodine Nausea And Vomiting   Augmentin [Amoxicillin-Pot Clavulanate] Nausea And Vomiting    Pt states was nauseated due to not eating enough   Doxycycline Other (See Comments)    Headache   Lisinopril Swelling    Lip swelling/Angioedema   Zofran [Ondansetron Hcl] Other (See Comments)    Severe headache   Medications:  Current Outpatient Medications:    azithromycin (ZITHROMAX) 250 MG tablet, Take 2 tablets on day 1, then 1 tablet daily on days 2 through 5, Disp: 6 tablet, Rfl: 0   fluticasone (FLONASE) 50 MCG/ACT nasal spray, Place 2 sprays into both nostrils daily., Disp: 16 g, Rfl: 0   promethazine-dextromethorphan (PROMETHAZINE-DM) 6.25-15 MG/5ML syrup, Take 5 mLs by mouth 4 (four) times daily as needed., Disp: 118 mL, Rfl: 0   albuterol (PROVENTIL) (2.5 MG/3ML) 0.083% nebulizer solution, Take 3 mLs (2.5 mg total) by nebulization every 6 (six) hours as needed for wheezing or shortness of breath., Disp: 75 mL, Rfl: 12   albuterol (VENTOLIN HFA) 108 (90 Base) MCG/ACT inhaler, Inhale 2 puffs into the lungs every 4 (four) hours as needed for wheezing or shortness of breath., Disp: 6.7 g, Rfl: 0   amLODipine (NORVASC) 10 MG tablet, Take 1 tablet (10 mg total) by mouth daily. (Patient not taking: Reported on 05/03/2022), Disp: 90 tablet, Rfl: 3   amLODipine (NORVASC) 10 MG tablet, Take 1 tablet (10 mg total) by mouth daily., Disp: 30 tablet, Rfl: 3   benzonatate (TESSALON) 100 MG capsule, Take 1 capsule (100 mg total) by mouth every 8 (eight) hours as needed for cough (Patient not taking: Reported on 05/03/2022), Disp: 21 capsule, Rfl: 0   eletriptan (RELPAX) 40 MG tablet, TAKE 1 TABLET BY MOUTH AS NEEDED FOR MIGRAINE OR HEADACHE. MAY REPEAT IN 2 HOURS IF HEADACHE PERSISTS OR RECURS., Disp: 9 tablet, Rfl: 11   fexofenadine (ALLEGRA ALLERGY) 180 MG tablet, Take 1 tablet (180 mg total) by mouth daily., Disp: 90 tablet, Rfl: 2   fluconazole (DIFLUCAN)  150 MG tablet, Take 1 tablet (150 mg) by mouth today, then take 1 tablet in 3 days. (Patient not taking: Reported on 08/14/2022), Disp: 2 tablet, Rfl: 0   Fluticasone-Umeclidin-Vilant (TRELEGY ELLIPTA) 200-62.5-25 MCG/ACT AEPB, Inhale 2 puffs into the lungs daily., Disp: 60 each, Rfl: 3   Fluticasone-Umeclidin-Vilant (TRELEGY ELLIPTA) 200-62.5-25 MCG/ACT AEPB, Inhale 1 puff into the lungs daily., Disp: 28 each, Rfl: 0   glipiZIDE (GLUCOTROL) 5 MG tablet, Take 1 tablet (5 mg total) by mouth daily with supper., Disp: 30 tablet, Rfl: 0   hydrochlorothiazide (HYDRODIURIL) 25 MG tablet, Take 1 tablet (25 mg total) by mouth daily., Disp: 90 tablet, Rfl: 2   ibuprofen (ADVIL,MOTRIN) 200 MG tablet, Take 800 mg by mouth every 6 (six) hours as needed for headache., Disp: , Rfl:    ipratropium (ATROVENT) 0.02 % nebulizer solution, Take 2.5 mLs (0.5 mg total) by nebulization every 8 (eight) hours as needed for wheezing or shortness of breath., Disp: 75 mL, Rfl: 12   megestrol (MEGACE) 40 MG tablet, Take 1 tablet (40 mg total) by mouth 2 (two) times daily. (Patient taking differently: Take 40 mg by mouth as needed.),  Disp: 30 tablet, Rfl: 2   metFORMIN (GLUCOPHAGE-XR) 500 MG 24 hr tablet, Take 2 tablets by mouth every day as directed, Disp: 60 tablet, Rfl: 2   metFORMIN (GLUMETZA) 1000 MG (MOD) 24 hr tablet, Take 1 tablet (1,000 mg total) by mouth daily., Disp: 30 tablet, Rfl: 2   methocarbamol (ROBAXIN) 500 MG tablet, Take 1 tablet (500 mg total) by mouth every 8 (eight) hours as needed for muscle spasms. (Patient not taking: Reported on 05/03/2022), Disp: 30 tablet, Rfl: 0   methylPREDNISolone (MEDROL DOSEPAK) 4 MG TBPK tablet, Take as directed per package instructions., Disp: 21 tablet, Rfl: 0   montelukast (SINGULAIR) 10 MG tablet, Take 1 tablet (10 mg total) by mouth at bedtime for 30 days., Disp: 90 tablet, Rfl: 1   predniSONE (STERAPRED UNI-PAK 48 TAB) 10 MG (48) TBPK tablet, Take as directed. (Patient not  taking: Reported on 05/03/2022), Disp: 48 tablet, Rfl: 0   prochlorperazine (COMPAZINE) 10 MG tablet, Take 1 tablet (10 mg total) by mouth every 6 (six) hours as needed for nausea or vomiting., Disp: 30 tablet, Rfl: 11   Rimegepant Sulfate (NURTEC) 75 MG TBDP, Take 75 mg by mouth daily as needed. For migraines. Take as close to onset of migraine as possible. One daily maximum., Disp: 6 tablet, Rfl: 0  Observations/Objective: Patient is well-developed, well-nourished in no acute distress.  Resting comfortably at home.  Head is normocephalic, atraumatic.  No labored breathing.  Speech is clear and coherent with logical content.  Patient is alert and oriented at baseline.    Assessment and Plan: 1. Acute bacterial sinusitis - azithromycin (ZITHROMAX) 250 MG tablet; Take 2 tablets on day 1, then 1 tablet daily on days 2 through 5  Dispense: 6 tablet; Refill: 0 - promethazine-dextromethorphan (PROMETHAZINE-DM) 6.25-15 MG/5ML syrup; Take 5 mLs by mouth 4 (four) times daily as needed.  Dispense: 118 mL; Refill: 0 - fluticasone (FLONASE) 50 MCG/ACT nasal spray; Place 2 sprays into both nostrils daily.  Dispense: 16 g; Refill: 0  - Worsening symptoms that have not responded to OTC medications.  - Will give Zpack - Flonase for nasal congestion and post nasal drainage - Promethazine DM for cough - Continue allergy medications.  - Steam and humidifier can help - Stay well hydrated and get plenty of rest.  - Seek in person evaluation if no symptom improvement or if symptoms worsen   Follow Up Instructions: I discussed the assessment and treatment plan with the patient. The patient was provided an opportunity to ask questions and all were answered. The patient agreed with the plan and demonstrated an understanding of the instructions.  A copy of instructions were sent to the patient via MyChart unless otherwise noted below.    The patient was advised to call back or seek an in-person evaluation if  the symptoms worsen or if the condition fails to improve as anticipated.  Time:  I spent 10 minutes with the patient via telehealth technology discussing the above problems/concerns.    Mary Loveless, PA-C

## 2022-08-22 NOTE — Patient Instructions (Signed)
Rene Kocher, thank you for joining [name], PA-C for today's virtual visit.  While this provider is not your primary care provider (PCP), if your PCP is located in our provider database this encounter information will be shared with them immediately following your visit.   A [facility] MyChart account gives you access to today's visit and all your visits, tests, and labs performed at [facility] " click here if you don't have a [facility] MyChart account or go to [email or web address]  Consent: (Patient) Mary Hanson provided verbal consent for this virtual visit at the beginning of the encounter.  Current Medications:  Current Outpatient Medications:    azithromycin (ZITHROMAX) 250 MG tablet, Take 2 tablets on day 1, then 1 tablet daily on days 2 through 5, Disp: 6 tablet, Rfl: 0   fluticasone (FLONASE) 50 MCG/ACT nasal spray, Place 2 sprays into both nostrils daily., Disp: 16 g, Rfl: 0   promethazine-dextromethorphan (PROMETHAZINE-DM) 6.25-15 MG/5ML syrup, Take 5 mLs by mouth 4 (four) times daily as needed., Disp: 118 mL, Rfl: 0   albuterol (PROVENTIL) (2.5 MG/3ML) 0.083% nebulizer solution, Take 3 mLs (2.5 mg total) by nebulization every 6 (six) hours as needed for wheezing or shortness of breath., Disp: 75 mL, Rfl: 12   albuterol (VENTOLIN HFA) 108 (90 Base) MCG/ACT inhaler, Inhale 2 puffs into the lungs every 4 (four) hours as needed for wheezing or shortness of breath., Disp: 6.7 g, Rfl: 0   amLODipine (NORVASC) 10 MG tablet, Take 1 tablet (10 mg total) by mouth daily. (Patient not taking: Reported on 05/03/2022), Disp: 90 tablet, Rfl: 3   amLODipine (NORVASC) 10 MG tablet, Take 1 tablet (10 mg total) by mouth daily., Disp: 30 tablet, Rfl: 3   benzonatate (TESSALON) 100 MG capsule, Take 1 capsule (100 mg total) by mouth every 8 (eight) hours as needed for cough (Patient not taking: Reported on 05/03/2022), Disp: 21 capsule, Rfl: 0   eletriptan (RELPAX) 40  MG tablet, TAKE 1 TABLET BY MOUTH AS NEEDED FOR MIGRAINE OR HEADACHE. MAY REPEAT IN 2 HOURS IF HEADACHE PERSISTS OR RECURS., Disp: 9 tablet, Rfl: 11   fexofenadine (ALLEGRA ALLERGY) 180 MG tablet, Take 1 tablet (180 mg total) by mouth daily., Disp: 90 tablet, Rfl: 2   fluconazole (DIFLUCAN) 150 MG tablet, Take 1 tablet (150 mg) by mouth today, then take 1 tablet in 3 days. (Patient not taking: Reported on 08/14/2022), Disp: 2 tablet, Rfl: 0   Fluticasone-Umeclidin-Vilant (TRELEGY ELLIPTA) 200-62.5-25 MCG/ACT AEPB, Inhale 2 puffs into the lungs daily., Disp: 60 each, Rfl: 3   Fluticasone-Umeclidin-Vilant (TRELEGY ELLIPTA) 200-62.5-25 MCG/ACT AEPB, Inhale 1 puff into the lungs daily., Disp: 28 each, Rfl: 0   glipiZIDE (GLUCOTROL) 5 MG tablet, Take 1 tablet (5 mg total) by mouth daily with supper., Disp: 30 tablet, Rfl: 0   hydrochlorothiazide (HYDRODIURIL) 25 MG tablet, Take 1 tablet (25 mg total) by mouth daily., Disp: 90 tablet, Rfl: 2   ibuprofen (ADVIL,MOTRIN) 200 MG tablet, Take 800 mg by mouth every 6 (six) hours as needed for headache., Disp: , Rfl:    ipratropium (ATROVENT) 0.02 % nebulizer solution, Take 2.5 mLs (0.5 mg total) by nebulization every 8 (eight) hours as needed for wheezing or shortness of breath., Disp: 75 mL, Rfl: 12   megestrol (MEGACE) 40 MG tablet, Take 1 tablet (40 mg total) by mouth 2 (two) times daily. (Patient taking differently: Take 40 mg by mouth as needed.), Disp: 30 tablet, Rfl: 2   metFORMIN (  GLUCOPHAGE-XR) 500 MG 24 hr tablet, Take 2 tablets by mouth every day as directed, Disp: 60 tablet, Rfl: 2   metFORMIN (GLUMETZA) 1000 MG (MOD) 24 hr tablet, Take 1 tablet (1,000 mg total) by mouth daily., Disp: 30 tablet, Rfl: 2   methocarbamol (ROBAXIN) 500 MG tablet, Take 1 tablet (500 mg total) by mouth every 8 (eight) hours as needed for muscle spasms. (Patient not taking: Reported on 05/03/2022), Disp: 30 tablet, Rfl: 0   methylPREDNISolone (MEDROL DOSEPAK) 4 MG TBPK tablet,  Take as directed per package instructions., Disp: 21 tablet, Rfl: 0   montelukast (SINGULAIR) 10 MG tablet, Take 1 tablet (10 mg total) by mouth at bedtime for 30 days., Disp: 90 tablet, Rfl: 1   predniSONE (STERAPRED UNI-PAK 48 TAB) 10 MG (48) TBPK tablet, Take as directed. (Patient not taking: Reported on 05/03/2022), Disp: 48 tablet, Rfl: 0   prochlorperazine (COMPAZINE) 10 MG tablet, Take 1 tablet (10 mg total) by mouth every 6 (six) hours as needed for nausea or vomiting., Disp: 30 tablet, Rfl: 11   Rimegepant Sulfate (NURTEC) 75 MG TBDP, Take 75 mg by mouth daily as needed. For migraines. Take as close to onset of migraine as possible. One daily maximum., Disp: 6 tablet, Rfl: 0   Medications ordered in this encounter:  Meds ordered this encounter  Medications   azithromycin (ZITHROMAX) 250 MG tablet    Sig: Take 2 tablets on day 1, then 1 tablet daily on days 2 through 5    Dispense:  6 tablet    Refill:  0    Order Specific Question:   Supervising Provider    Answer:   Merrilee Jansky [[id number]]   promethazine-dextromethorphan (PROMETHAZINE-DM) 6.25-15 MG/5ML syrup    Sig: Take 5 mLs by mouth 4 (four) times daily as needed.    Dispense:  118 mL    Refill:  0    Order Specific Question:   Supervising Provider    Answer:   Merrilee Jansky [[id number]]   fluticasone (FLONASE) 50 MCG/ACT nasal spray    Sig: Place 2 sprays into both nostrils daily.    Dispense:  16 g    Refill:  0    Order Specific Question:   Supervising Provider    Answer:   Merrilee Jansky [id number]     *If you need refills on other medications prior to your next appointment, please contact your pharmacy*  Follow-Up: Call back or seek an in-person evaluation if the symptoms worsen or if the condition fails to improve as anticipated.  [facility] [phone]  Other Instructions  Sinus Infection, Adult A sinus infection, also called sinusitis, is inflammation of your sinuses. Sinuses are  hollow spaces in the bones around your face. Your sinuses are located: Around your eyes. In the middle of your forehead. Behind your nose. In your cheekbones. Mucus normally drains out of your sinuses. When your nasal tissues become inflamed or swollen, mucus can become trapped or blocked. This allows bacteria, viruses, and fungi to grow, which leads to infection. Most infections of the sinuses are caused by a virus. A sinus infection can develop quickly. It can last for up to 4 weeks (acute) or for more than 12 weeks (chronic). A sinus infection often develops after a cold. What are the causes? This condition is caused by anything that creates swelling in the sinuses or stops mucus from draining. This includes: Allergies. Asthma. Infection from bacteria or viruses. Deformities or blockages in  your nose or sinuses. Abnormal growths in the nose (nasal polyps). Pollutants, such as chemicals or irritants in the air. Infection from fungi. This is rare. What increases the risk? You are more likely to develop this condition if you: Have a weak body defense system (immune system). Do a lot of swimming or diving. Overuse nasal sprays. Smoke. What are the signs or symptoms? The main symptoms of this condition are pain and a feeling of pressure around the affected sinuses. Other symptoms include: Stuffy nose or congestion that makes it difficult to breathe through your nose. Thick yellow or greenish drainage from your nose. Tenderness, swelling, and warmth over the affected sinuses. A cough that may get worse at night. Decreased sense of smell and taste. Extra mucus that collects in the throat or the back of the nose (postnasal drip) causing a sore throat or bad breath. Tiredness (fatigue). Fever. How is this diagnosed? This condition is diagnosed based on: Your symptoms. Your medical history. A physical exam. Tests to find out if your condition is acute or chronic. This may  include: Checking your nose for nasal polyps. Viewing your sinuses using a device that has a light (endoscope). Testing for allergies or bacteria. Imaging tests, such as an MRI or CT scan. In rare cases, a bone biopsy may be done to rule out more serious types of fungal sinus disease. How is this treated? Treatment for a sinus infection depends on the cause and whether your condition is chronic or acute. If caused by a virus, your symptoms should go away on their own within 10 days. You may be given medicines to relieve symptoms. They include: Medicines that shrink swollen nasal passages (decongestants). A spray that eases inflammation of the nostrils (topical intranasal corticosteroids). Rinses that help get rid of thick mucus in your nose (nasal saline washes). Medicines that treat allergies (antihistamines). Over-the-counter pain relievers. If caused by bacteria, your health care provider may recommend waiting to see if your symptoms improve. Most bacterial infections will get better without antibiotic medicine. You may be given antibiotics if you have: A severe infection. A weak immune system. If caused by narrow nasal passages or nasal polyps, surgery may be needed. Follow these instructions at home: Medicines Take, use, or apply over-the-counter and prescription medicines only as told by your health care provider. These may include nasal sprays. If you were prescribed an antibiotic medicine, take it as told by your health care provider. Do not stop taking the antibiotic even if you start to feel better. Hydrate and humidify  Drink enough fluid to keep your urine pale yellow. Staying hydrated will help to thin your mucus. Use a cool mist humidifier to keep the humidity level in your home above 50%. Inhale steam for 10-15 minutes, 3-4 times a day, or as told by your health care provider. You can do this in the bathroom while a hot shower is running. Limit your exposure to cool or dry  air. Rest Rest as much as possible. Sleep with your head raised (elevated). Make sure you get enough sleep each night. General instructions  Apply a warm, moist washcloth to your face 3-4 times a day or as told by your health care provider. This will help with discomfort. Use nasal saline washes as often as told by your health care provider. Wash your hands often with soap and water to reduce your exposure to germs. If soap and water are not available, use hand sanitizer. Do not smoke. Avoid being around people  who are smoking (secondhand smoke). Keep all follow-up visits. This is important. Contact a health care provider if: You have a fever. Your symptoms get worse. Your symptoms do not improve within 10 days. Get help right away if: You have a severe headache. You have persistent vomiting. You have severe pain or swelling around your face or eyes. You have vision problems. You develop confusion. Your neck is stiff. You have trouble breathing. These symptoms may be an emergency. Get help right away. Call 911. Do not wait to see if the symptoms will go away. Do not drive yourself to the hospital. Summary A sinus infection is soreness and inflammation of your sinuses. Sinuses are hollow spaces in the bones around your face. This condition is caused by nasal tissues that become inflamed or swollen. The swelling traps or blocks the flow of mucus. This allows bacteria, viruses, and fungi to grow, which leads to infection. If you were prescribed an antibiotic medicine, take it as told by your health care provider. Do not stop taking the antibiotic even if you start to feel better. Keep all follow-up visits. This is important. This information is not intended to replace advice given to you by your health care provider. Make sure you discuss any questions you have with your health care provider. Document Revised: 03/15/2021 Document Reviewed: 03/15/2021 Elsevier Patient Education  2023  Elsevier Inc[employer]    If you have been instructed to have an in-person evaluation today at a local Urgent Care facility, please use the link below. It will take you to a list of all of our available [facility], including address, phone number and hours of operation. Please do not delay care.  [facility]  If you or a family member do not have a primary care provider, use the link below to schedule a visit and establish care. When you choose a [facility] primary care physician or advanced practice provider, you gain a long-term partner in health. Find a Primary Care Provider  Learn more about [facility]'s in-office and virtual care options: [facility] - Get Care Now

## 2022-08-25 ENCOUNTER — Telehealth: Payer: Commercial Managed Care - PPO | Admitting: Nurse Practitioner

## 2022-08-25 ENCOUNTER — Other Ambulatory Visit (HOSPITAL_COMMUNITY): Payer: Self-pay

## 2022-08-25 DIAGNOSIS — J0140 Acute pansinusitis, unspecified: Secondary | ICD-10-CM | POA: Diagnosis not present

## 2022-08-25 MED ORDER — AMOXICILLIN 875 MG PO TABS
875.0000 mg | ORAL_TABLET | Freq: Two times a day (BID) | ORAL | 0 refills | Status: AC
Start: 2022-08-25 — End: 2022-09-02
  Filled 2022-08-25: qty 15, 8d supply, fill #0

## 2022-08-25 NOTE — Progress Notes (Signed)
Virtual Visit Consent   Evania A Biber, you are scheduled for a virtual visit with a [facility] provider today. Just as with appointments in the office, your consent must be obtained to participate. Your consent will be active for this visit and any virtual visit you may have with one of our providers in the next 365 days. If you have a MyChart account, a copy of this consent can be sent to you electronically.  As this is a virtual visit, video technology does not allow for your provider to perform a traditional examination. This may limit your provider's ability to fully assess your condition. If your provider identifies any concerns that need to be evaluated in person or the need to arrange testing (such as labs, EKG, etc.), we will make arrangements to do so. Although advances in technology are sophisticated, we cannot ensure that it will always work on either your end or our end. If the connection with a video visit is poor, the visit may have to be switched to a telephone visit. With either a video or telephone visit, we are not always able to ensure that we have a secure connection.  By engaging in this virtual visit, you consent to the provision of healthcare and authorize for your insurance to be billed (if applicable) for the services provided during this visit. Depending on your insurance coverage, you may receive a charge related to this service.  I need to obtain your verbal consent now. Are you willing to proceed with your visit today? Mary Hanson has provided verbal consent on 08/25/2022 for a virtual visit (video or telephone). Viviano Simas, FNP  Date: 08/25/2022 2:17 PM  Virtual Visit via Video Note   I, Viviano Simas, connected with  Mary Hanson  ([id number], 1976/01/26) on 08/25/22 at  2:30 PM EDT by a video-enabled telemedicine application and verified that I am speaking with the correct person using two identifiers.  Location: Patient: Virtual Visit Location Patient:  Home Provider: Virtual Visit Location Provider: Home Office   I discussed the limitations of evaluation and management by telemedicine and the availability of in person appointments. The patient expressed understanding and agreed to proceed.    History of Present Illness: Mary Hanson is a 47 y.o. who identifies as a female who was assigned female at birth, and is being seen today for ongoing sinus symptoms.  Symptom onset was 10 days ago   She was seen earlier this week and started on azithromycin cough medication and flonase   She has continue to have a headache and fevers  She has continues to use tylenol and ibuprofen for the sinus headaches   Today is day 4 on antibiotics   Historically she had a stomach sensitivity to Augmentin due to GI upset likely related to no eating with medicine   Problems:  Patient Active Problem List   Diagnosis Date Noted   Type 2 diabetes mellitus without complication, without long-term current use of insulin (HCC) 11/28/2018   Moderate persistent asthma without complication 05/16/2018   Solitary pulmonary nodule 02/10/2016   Menorrhagia 05/31/2015   Peripheral neuralgia 11/11/2014   Enlarged thyroid 04/03/2013   Unspecified vitamin D deficiency 07/29/2012   External hemorrhoid 09/06/2011   Essential hypertension 12/29/2010   Morbid obesity (HCC) 12/29/2010   Allergic rhinitis 12/29/2010   Migraine headache 12/29/2010    Allergies:  Allergies  Allergen Reactions   Mobic [Meloxicam] Swelling    Swelling of arms and legs, tightness in chest  Iodine Nausea And Vomiting   Augmentin [Amoxicillin-Pot Clavulanate] Nausea And Vomiting    Pt states was nauseated due to not eating enough   Doxycycline Other (See Comments)    Headache   Lisinopril Swelling    Lip swelling/Angioedema   Zofran [Ondansetron Hcl] Other (See Comments)    Severe headache   Medications:  Current Outpatient Medications:    albuterol (PROVENTIL) (2.5 MG/3ML) 0.083%  nebulizer solution, Take 3 mLs (2.5 mg total) by nebulization every 6 (six) hours as needed for wheezing or shortness of breath., Disp: 75 mL, Rfl: 12   albuterol (VENTOLIN HFA) 108 (90 Base) MCG/ACT inhaler, Inhale 2 puffs into the lungs every 4 (four) hours as needed for wheezing or shortness of breath., Disp: 6.7 g, Rfl: 0   amLODipine (NORVASC) 10 MG tablet, Take 1 tablet (10 mg total) by mouth daily. (Patient not taking: Reported on 05/03/2022), Disp: 90 tablet, Rfl: 3   amLODipine (NORVASC) 10 MG tablet, Take 1 tablet (10 mg total) by mouth daily., Disp: 30 tablet, Rfl: 3   azithromycin (ZITHROMAX) 250 MG tablet, Take 2 tablets on day 1, then 1 tablet daily on days 2 through 5, Disp: 6 tablet, Rfl: 0   benzonatate (TESSALON) 100 MG capsule, Take 1 capsule (100 mg total) by mouth every 8 (eight) hours as needed for cough (Patient not taking: Reported on 05/03/2022), Disp: 21 capsule, Rfl: 0   eletriptan (RELPAX) 40 MG tablet, TAKE 1 TABLET BY MOUTH AS NEEDED FOR MIGRAINE OR HEADACHE. MAY REPEAT IN 2 HOURS IF HEADACHE PERSISTS OR RECURS., Disp: 9 tablet, Rfl: 11   fexofenadine (ALLEGRA ALLERGY) 180 MG tablet, Take 1 tablet (180 mg total) by mouth daily., Disp: 90 tablet, Rfl: 2   fluconazole (DIFLUCAN) 150 MG tablet, Take 1 tablet (150 mg) by mouth today, then take 1 tablet in 3 days. (Patient not taking: Reported on 08/14/2022), Disp: 2 tablet, Rfl: 0   fluticasone (FLONASE) 50 MCG/ACT nasal spray, Place 2 sprays into both nostrils daily., Disp: 16 g, Rfl: 0   Fluticasone-Umeclidin-Vilant (TRELEGY ELLIPTA) 200-62.5-25 MCG/ACT AEPB, Inhale 2 puffs into the lungs daily., Disp: 60 each, Rfl: 3   Fluticasone-Umeclidin-Vilant (TRELEGY ELLIPTA) 200-62.5-25 MCG/ACT AEPB, Inhale 1 puff into the lungs daily., Disp: 28 each, Rfl: 0   glipiZIDE (GLUCOTROL) 5 MG tablet, Take 1 tablet (5 mg total) by mouth daily with supper., Disp: 30 tablet, Rfl: 0   hydrochlorothiazide (HYDRODIURIL) 25 MG tablet, Take 1 tablet  (25 mg total) by mouth daily., Disp: 90 tablet, Rfl: 2   ibuprofen (ADVIL,MOTRIN) 200 MG tablet, Take 800 mg by mouth every 6 (six) hours as needed for headache., Disp: , Rfl:    ipratropium (ATROVENT) 0.02 % nebulizer solution, Take 2.5 mLs (0.5 mg total) by nebulization every 8 (eight) hours as needed for wheezing or shortness of breath., Disp: 75 mL, Rfl: 12   megestrol (MEGACE) 40 MG tablet, Take 1 tablet (40 mg total) by mouth 2 (two) times daily. (Patient taking differently: Take 40 mg by mouth as needed.), Disp: 30 tablet, Rfl: 2   metFORMIN (GLUCOPHAGE-XR) 500 MG 24 hr tablet, Take 2 tablets by mouth every day as directed, Disp: 60 tablet, Rfl: 2   metFORMIN (GLUMETZA) 1000 MG (MOD) 24 hr tablet, Take 1 tablet (1,000 mg total) by mouth daily., Disp: 30 tablet, Rfl: 2   methocarbamol (ROBAXIN) 500 MG tablet, Take 1 tablet (500 mg total) by mouth every 8 (eight) hours as needed for muscle spasms. (Patient not taking: Reported  on 05/03/2022), Disp: 30 tablet, Rfl: 0   methylPREDNISolone (MEDROL DOSEPAK) 4 MG TBPK tablet, Take as directed per package instructions., Disp: 21 tablet, Rfl: 0   montelukast (SINGULAIR) 10 MG tablet, Take 1 tablet (10 mg total) by mouth at bedtime for 30 days., Disp: 90 tablet, Rfl: 1   predniSONE (STERAPRED UNI-PAK 48 TAB) 10 MG (48) TBPK tablet, Take as directed. (Patient not taking: Reported on 05/03/2022), Disp: 48 tablet, Rfl: 0   prochlorperazine (COMPAZINE) 10 MG tablet, Take 1 tablet (10 mg total) by mouth every 6 (six) hours as needed for nausea or vomiting., Disp: 30 tablet, Rfl: 11   promethazine-dextromethorphan (PROMETHAZINE-DM) 6.25-15 MG/5ML syrup, Take 5 mLs by mouth 4 (four) times daily as needed., Disp: 118 mL, Rfl: 0   Rimegepant Sulfate (NURTEC) 75 MG TBDP, Take 75 mg by mouth daily as needed. For migraines. Take as close to onset of migraine as possible. One daily maximum., Disp: 6 tablet, Rfl: 0  Observations/Objective: Patient is well-developed,  well-nourished in no acute distress.  Resting comfortably  at home.  Head is normocephalic, atraumatic.  No labored breathing.  Speech is clear and coherent with logical content.  Patient is alert and oriented at baseline.    Assessment and Plan: 1. Acute non-recurrent pansinusitis Continue Flonase  Finish azithromycin tomorrow   - amoxicillin (AMOXIL) 875 MG tablet; Take 1 tablet (875 mg total) by mouth 2 (two) times daily for 7 days. Take with food  Dispense: 15 tablet; Refill: 0     Follow Up Instructions: I discussed the assessment and treatment plan with the patient. The patient was provided an opportunity to ask questions and all were answered. The patient agreed with the plan and demonstrated an understanding of the instructions.  A copy of instructions were sent to the patient via MyChart unless otherwise noted below.    The patient was advised to call back or seek an in-person evaluation if the symptoms worsen or if the condition fails to improve as anticipated.  Time:  I spent 15 minutes with the patient via telehealth technology discussing the above problems/concerns.    Viviano Simas, FNP

## 2022-09-13 ENCOUNTER — Other Ambulatory Visit (HOSPITAL_COMMUNITY): Payer: Self-pay

## 2022-09-14 ENCOUNTER — Other Ambulatory Visit (HOSPITAL_COMMUNITY): Payer: Self-pay

## 2022-09-14 MED ORDER — AZITHROMYCIN 500 MG PO TABS
ORAL_TABLET | ORAL | 0 refills | Status: DC
  Filled 2022-09-14: qty 10, 5d supply, fill #0

## 2022-09-14 MED ORDER — ATOVAQUONE-PROGUANIL HCL 250-100 MG PO TABS
ORAL_TABLET | ORAL | 0 refills | Status: DC
  Filled 2022-09-14: qty 30, 30d supply, fill #0

## 2022-09-14 MED ORDER — PREDNISONE 20 MG PO TABS
ORAL_TABLET | ORAL | 0 refills | Status: DC
  Filled 2022-09-14: qty 10, 5d supply, fill #0

## 2022-09-23 DIAGNOSIS — M25551 Pain in right hip: Secondary | ICD-10-CM | POA: Diagnosis not present

## 2022-09-23 DIAGNOSIS — M25572 Pain in left ankle and joints of left foot: Secondary | ICD-10-CM | POA: Diagnosis not present

## 2022-10-09 ENCOUNTER — Telehealth: Payer: 59 | Admitting: Neurology

## 2022-10-12 ENCOUNTER — Encounter: Payer: Self-pay | Admitting: Neurology

## 2022-10-12 ENCOUNTER — Telehealth: Payer: 59 | Admitting: Neurology

## 2022-10-12 NOTE — Progress Notes (Deleted)
[facility]    Provider:  Dr Lucia Gaskins Requesting Provider: Lum Keas Franciso Bend* Primary Care Provider:  Patrecia Pour, MD  CC:  migraines  HPI:  Mary Hanson is a 47 y.o. female here as requested by Hollie Beach* for migraines.   She is having them more frequently around her cycle. We saw her last in 2016-2017, remains episodic, She usually takes excedrin/ibuprofen. She gets 2-3 migraine days a month. They can last 24 hours. Light sensitiivty, pulsating/pounding/throbbing, sleep helps, relpax works, she gets nausea badly, no vomiting, hurts to move, zofran gives her bad headaches. No other focal neurologic deficits, associated symptoms, inciting events or modifiable factors. Not exertional or positional, no changes in quality or severity, no red flags for imaging at this time, no vision changes.   Reviewed notes, labs and imaging from outside physicians, which showed:  From a thorough review of records, medications tried imitrex and relpax and didn't work, also Human resources officer and treximet, Solu-Medrol, Robaxin, Tylenol, amlodipine, Excedrin, Fioricet, Flexeril, Decadron, diclofenac tabs, Relpax, ibuprofen, ketorolac injections, lisinopril, Reglan, naproxen, prednisone and Compazine.  CT head 02/07/2013:  showed No acute intracranial abnormalities including mass lesion or mass effect, hydrocephalus, extra-axial fluid collection, midline shift, hemorrhage, or acute infarction, large ischemic events (personally reviewed images)    Review of Systems: Patient complains of symptoms per HPI as well as the following symptoms migraines. Pertinent negatives and positives per HPI. All others negative.   Social History   Socioeconomic History   Marital status: Single    Spouse name: Not on file   Number of children: 0   Years of education: Ba/Grad [place]   Highest education level: Not on file  Occupational History    Employer: [employer]  Tobacco Use    Smoking status: Never   Smokeless tobacco: Never  Vaping Use   Vaping Use: Never used  Substance and Sexual Activity   Alcohol use: Not Currently    Comment: Wine once a year    Drug use: No   Sexual activity: Yes    Birth control/protection: Condom, Pill    Comment: quit BCP whils on megace 7/14  Other Topics Concern   Not on file  Social History Narrative   Lives at home with herself.   Right handed.   Caffeine use: Drinks soda daily (Drinks three 20oz per day)   Social Determinants of Corporate investment banker Strain: Not on file  Food Insecurity: Not on file  Transportation Needs: Not on file  Physical Activity: Not on file  Stress: Not on file  Social Connections: Not on file  Intimate Partner Violence: Not on file    Family History  Problem Relation Age of Onset   Hypertension Mother    Diabetes Father    Diabetes type II Father    Hypertension Father    Stroke Father    Goiter Sister 51       identical twin   Asthma Sister    Hypertension Sister    Stroke Maternal Grandfather    Diabetes Paternal Grandfather    Heart disease Paternal Grandfather    Migraines Neg Hx     Past Medical History:  Diagnosis Date   Allergy-induced asthma    prn inhaler   Diabetes mellitus without complication (HCC)    History of anemia    no current problem, per pt.   Hypertension    under control with med., has been on med. x 1 yr.   Migraines  Obesity    Pyogenic granuloma 12/2013   right ring finger   Seasonal allergies     Patient Active Problem List   Diagnosis Date Noted   Type 2 diabetes mellitus without complication, without long-term current use of insulin (HCC) 11/28/2018   Moderate persistent asthma without complication 05/16/2018   Solitary pulmonary nodule 02/10/2016   Menorrhagia 05/31/2015   Peripheral neuralgia 11/11/2014   Enlarged thyroid 04/03/2013   Unspecified vitamin D deficiency 07/29/2012   External hemorrhoid 09/06/2011   Essential  hypertension 12/29/2010   Morbid obesity (HCC) 12/29/2010   Allergic rhinitis 12/29/2010   Migraine headache 12/29/2010    Past Surgical History:  Procedure Laterality Date   BREAST BIOPSY Right 04/24/2018   Rt br bx @  [facility]   CERVICAL CONE BIOPSY  age 84   CHOLECYSTECTOMY  2006 or 2007   DILATION AND CURETTAGE OF UTERUS     DILATION AND EVACUATION  age 29   MASS EXCISION Right 01/16/2014   Procedure: EXCISION PYOGENIC GRANULOMA FROM RIGHT RING FINGER;  Surgeon: Dairl Ponder, MD;  Location: [facility];  Service: Orthopedics;  Laterality: Right;    Current Outpatient Medications  Medication Sig Dispense Refill   albuterol (PROVENTIL) (2.5 MG/3ML) 0.083% nebulizer solution Take 3 mLs (2.5 mg total) by nebulization every 6 (six) hours as needed for wheezing or shortness of breath. 75 mL 12   albuterol (VENTOLIN HFA) 108 (90 Base) MCG/ACT inhaler Inhale 2 puffs into the lungs every 4 (four) hours as needed for wheezing or shortness of breath. 6.7 g 0   amLODipine (NORVASC) 10 MG tablet Take 1 tablet (10 mg total) by mouth daily. (Patient not taking: Reported on 05/03/2022) 90 tablet 3   amLODipine (NORVASC) 10 MG tablet Take 1 tablet (10 mg total) by mouth daily. 30 tablet 3   atovaquone-proguanil (MALARONE) 250-100 MG TABS tablet Take 1 tablet daily. Begin 1 - 2 days before travel, daily during travel, and for 7 days after leaving. 30 tablet 0   azithromycin (ZITHROMAX) 500 MG tablet Take 2 tablets by mouth with the start of any diarrhea. 10 tablet 0   benzonatate (TESSALON) 100 MG capsule Take 1 capsule (100 mg total) by mouth every 8 (eight) hours as needed for cough (Patient not taking: Reported on 05/03/2022) 21 capsule 0   eletriptan (RELPAX) 40 MG tablet TAKE 1 TABLET BY MOUTH AS NEEDED FOR MIGRAINE OR HEADACHE. MAY REPEAT IN 2 HOURS IF HEADACHE PERSISTS OR RECURS. 9 tablet 11   fexofenadine (ALLEGRA ALLERGY) 180 MG tablet Take 1 tablet (180 mg  total) by mouth daily. 90 tablet 2   fluconazole (DIFLUCAN) 150 MG tablet Take 1 tablet (150 mg) by mouth today, then take 1 tablet in 3 days. (Patient not taking: Reported on 08/14/2022) 2 tablet 0   fluticasone (FLONASE) 50 MCG/ACT nasal spray Place 2 sprays into both nostrils daily. 16 g 0   Fluticasone-Umeclidin-Vilant (TRELEGY ELLIPTA) 200-62.5-25 MCG/ACT AEPB Inhale 2 puffs into the lungs daily. 60 each 3   Fluticasone-Umeclidin-Vilant (TRELEGY ELLIPTA) 200-62.5-25 MCG/ACT AEPB Inhale 1 puff into the lungs daily. 28 each 0   glipiZIDE (GLUCOTROL) 5 MG tablet Take 1 tablet (5 mg total) by mouth daily with supper. 30 tablet 0   hydrochlorothiazide (HYDRODIURIL) 25 MG tablet Take 1 tablet (25 mg total) by mouth daily. 90 tablet 2   ibuprofen (ADVIL,MOTRIN) 200 MG tablet Take 800 mg by mouth every 6 (six) hours as needed for headache.  ipratropium (ATROVENT) 0.02 % nebulizer solution Take 2.5 mLs (0.5 mg total) by nebulization every 8 (eight) hours as needed for wheezing or shortness of breath. 75 mL 12   megestrol (MEGACE) 40 MG tablet Take 1 tablet (40 mg total) by mouth 2 (two) times daily. (Patient taking differently: Take 40 mg by mouth as needed.) 30 tablet 2   metFORMIN (GLUCOPHAGE-XR) 500 MG 24 hr tablet Take 2 tablets by mouth every day as directed 60 tablet 2   metFORMIN (GLUMETZA) 1000 MG (MOD) 24 hr tablet Take 1 tablet (1,000 mg total) by mouth daily. 30 tablet 2   methocarbamol (ROBAXIN) 500 MG tablet Take 1 tablet (500 mg total) by mouth every 8 (eight) hours as needed for muscle spasms. (Patient not taking: Reported on 05/03/2022) 30 tablet 0   methylPREDNISolone (MEDROL DOSEPAK) 4 MG TBPK tablet Take as directed per package instructions. 21 tablet 0   montelukast (SINGULAIR) 10 MG tablet Take 1 tablet (10 mg total) by mouth at bedtime for 30 days. 90 tablet 1   predniSONE (DELTASONE) 20 MG tablet Take 2 tablets by mouth every day for 5 days. 10 tablet 0   predniSONE (STERAPRED  UNI-PAK 48 TAB) 10 MG (48) TBPK tablet Take as directed. (Patient not taking: Reported on 05/03/2022) 48 tablet 0   prochlorperazine (COMPAZINE) 10 MG tablet Take 1 tablet (10 mg total) by mouth every 6 (six) hours as needed for nausea or vomiting. 30 tablet 11   promethazine-dextromethorphan (PROMETHAZINE-DM) 6.25-15 MG/5ML syrup Take 5 mLs by mouth 4 (four) times daily as needed. 118 mL 0   Rimegepant Sulfate (NURTEC) 75 MG TBDP Take 75 mg by mouth daily as needed. For migraines. Take as close to onset of migraine as possible. One daily maximum. 6 tablet 0   No current facility-administered medications for this visit.    Allergies as of 10/12/2022 - Review Complete 08/22/2022  Allergen Reaction Noted   Mobic [meloxicam] Swelling 05/28/2020   Iodine Nausea And Vomiting 11/19/2014   Augmentin [amoxicillin-pot clavulanate] Nausea And Vomiting 11/11/2014   Doxycycline Other (See Comments) 12/13/2021   Lisinopril Swelling 01/11/2015   Zofran [ondansetron hcl] Other (See Comments) 01/16/2014    Vitals: There were no vitals taken for this visit. Last Weight:  Wt Readings from Last 1 Encounters:  08/14/22 (!) 313 lb (142 kg)   Last Height:   Ht Readings from Last 1 Encounters:  05/03/22 5\' 6"  (1.676 m)     Physical exam: Exam: Gen: NAD, conversant, well nourised, obese, well groomed                     CV: RRR, no MRG. No Carotid Bruits. No peripheral edema, warm, nontender Eyes: Conjunctivae clear without exudates or hemorrhage  Neuro: Detailed Neurologic Exam  Speech:    Speech is normal; fluent and spontaneous with normal comprehension.  Cognition:    The patient is oriented to person, place, and time;     recent and remote memory intact;     language fluent;     normal attention, concentration,     fund of knowledge Cranial Nerves:    The pupils are equal, round, and reactive to light. The fundi are normal and spontaneous venous pulsations are present. Visual fields are  full to finger confrontation. Extraocular movements are intact. Trigeminal sensation is intact and the muscles of mastication are normal. The face is symmetric. The palate elevates in the midline. Hearing intact. Voice is normal. Shoulder shrug is normal.  The tongue has normal motion without fasciculations.   Coordination:    Normal  Gait:    normal.   Motor Observation:    No asymmetry, no atrophy, and no involuntary movements noted. Tone:    Normal muscle tone.    Posture:    Posture is normal. normal erect    Strength:    Strength is V/V in the upper and lower limbs.      Sensation: intact to LT     Reflex Exam:  DTR's:    Deep tendon reflexes in the upper and lower extremities are normal bilaterally.   Toes:    The toes are downgoing bilaterally.   Clonus:    Clonus is absent.    Assessment/Plan:  Patient with episodic migraines  At onset of headache take Relpax(4 hour half life), can take it with an excedrin or ibuprofen/alleve or tylenol, can also take compazine for nausea and also try Nurtec; and can play with combining these medications right at onset of migraine. Also Nurtec has a 12 hour half life so if you think you may have a migraine due to menses or any other suspicion of migraine coming you can a nurtec and see if stops it from occurring.  We have new GREAT medications such as Ascencion Dike, [place], Stem, Bernita Raisin - look them up, great new class of medications, discussed.  No orders of the defined types were placed in this encounter.   Cc: Shackleford, Franciso Bend*,  Shackleford, Lovell Sheehan, MD  Naomie Dean, MD  [facility] [address] [place], [place] [address]  Phone [phone] Fax [phone]

## 2022-10-25 ENCOUNTER — Other Ambulatory Visit (HOSPITAL_COMMUNITY): Payer: Self-pay

## 2022-10-25 DIAGNOSIS — M722 Plantar fascial fibromatosis: Secondary | ICD-10-CM | POA: Diagnosis not present

## 2022-10-25 DIAGNOSIS — M7662 Achilles tendinitis, left leg: Secondary | ICD-10-CM | POA: Diagnosis not present

## 2022-10-25 MED ORDER — METHYLPREDNISOLONE 4 MG PO TABS
ORAL_TABLET | ORAL | 0 refills | Status: AC
  Filled 2022-10-25: qty 21, 6d supply, fill #0

## 2022-11-08 ENCOUNTER — Other Ambulatory Visit (HOSPITAL_COMMUNITY): Payer: Self-pay

## 2022-11-15 ENCOUNTER — Other Ambulatory Visit (HOSPITAL_COMMUNITY): Payer: Self-pay

## 2022-11-20 ENCOUNTER — Telehealth: Payer: Commercial Managed Care - PPO | Admitting: Physician Assistant

## 2022-11-20 DIAGNOSIS — H1033 Unspecified acute conjunctivitis, bilateral: Secondary | ICD-10-CM

## 2022-11-20 MED ORDER — POLYMYXIN B-TRIMETHOPRIM 10000-0.1 UNIT/ML-% OP SOLN: OPHTHALMIC | 0 refills | Status: DC

## 2022-11-20 NOTE — Patient Instructions (Signed)
Rene Kocher, thank you for joining Piedad Climes, PA-C for today's virtual visit.  While this provider is not your primary care provider (PCP), if your PCP is located in our provider database this encounter information will be shared with them immediately following your visit.   A [facility] MyChart account gives you access to today's visit and all your visits, tests, and labs performed at [facility] " click here if you don't have a [facility] MyChart account or go to [email or web address]  Consent: (Patient) Mary Hanson provided verbal consent for this virtual visit at the beginning of the encounter.  Current Medications:  Current Outpatient Medications:    albuterol (PROVENTIL) (2.5 MG/3ML) 0.083% nebulizer solution, Take 3 mLs (2.5 mg total) by nebulization every 6 (six) hours as needed for wheezing or shortness of breath., Disp: 75 mL, Rfl: 12   albuterol (VENTOLIN HFA) 108 (90 Base) MCG/ACT inhaler, Inhale 2 puffs into the lungs every 4 (four) hours as needed for wheezing or shortness of breath., Disp: 6.7 g, Rfl: 0   amLODipine (NORVASC) 10 MG tablet, Take 1 tablet (10 mg total) by mouth daily. (Patient not taking: Reported on 05/03/2022), Disp: 90 tablet, Rfl: 3   amLODipine (NORVASC) 10 MG tablet, Take 1 tablet (10 mg total) by mouth daily., Disp: 30 tablet, Rfl: 3   atovaquone-proguanil (MALARONE) 250-100 MG TABS tablet, Take 1 tablet daily. Begin 1 - 2 days before travel, daily during travel, and for 7 days after leaving., Disp: 30 tablet, Rfl: 0   azithromycin (ZITHROMAX) 500 MG tablet, Take 2 tablets by mouth with the start of any diarrhea., Disp: 10 tablet, Rfl: 0   benzonatate (TESSALON) 100 MG capsule, Take 1 capsule (100 mg total) by mouth every 8 (eight) hours as needed for cough (Patient not taking: Reported on 05/03/2022), Disp: 21 capsule, Rfl: 0   eletriptan (RELPAX) 40 MG tablet, TAKE 1 TABLET BY MOUTH AS NEEDED FOR MIGRAINE OR HEADACHE. MAY  REPEAT IN 2 HOURS IF HEADACHE PERSISTS OR RECURS., Disp: 9 tablet, Rfl: 11   fexofenadine (ALLEGRA ALLERGY) 180 MG tablet, Take 1 tablet (180 mg total) by mouth daily., Disp: 90 tablet, Rfl: 2   fluconazole (DIFLUCAN) 150 MG tablet, Take 1 tablet (150 mg) by mouth today, then take 1 tablet in 3 days. (Patient not taking: Reported on 08/14/2022), Disp: 2 tablet, Rfl: 0   fluticasone (FLONASE) 50 MCG/ACT nasal spray, Place 2 sprays into both nostrils daily., Disp: 16 g, Rfl: 0   Fluticasone-Umeclidin-Vilant (TRELEGY ELLIPTA) 200-62.5-25 MCG/ACT AEPB, Inhale 2 puffs into the lungs daily., Disp: 60 each, Rfl: 3   Fluticasone-Umeclidin-Vilant (TRELEGY ELLIPTA) 200-62.5-25 MCG/ACT AEPB, Inhale 1 puff into the lungs daily., Disp: 28 each, Rfl: 0   glipiZIDE (GLUCOTROL) 5 MG tablet, Take 1 tablet (5 mg total) by mouth daily with supper., Disp: 30 tablet, Rfl: 0   hydrochlorothiazide (HYDRODIURIL) 25 MG tablet, Take 1 tablet (25 mg total) by mouth daily., Disp: 90 tablet, Rfl: 2   ibuprofen (ADVIL,MOTRIN) 200 MG tablet, Take 800 mg by mouth every 6 (six) hours as needed for headache., Disp: , Rfl:    ipratropium (ATROVENT) 0.02 % nebulizer solution, Take 2.5 mLs (0.5 mg total) by nebulization every 8 (eight) hours as needed for wheezing or shortness of breath., Disp: 75 mL, Rfl: 12   megestrol (MEGACE) 40 MG tablet, Take 1 tablet (40 mg total) by mouth 2 (two) times daily. (Patient taking differently: Take 40 mg by mouth as needed.), Disp: 30  tablet, Rfl: 2   metFORMIN (GLUCOPHAGE-XR) 500 MG 24 hr tablet, Take 2 tablets by mouth every day as directed, Disp: 60 tablet, Rfl: 2   metFORMIN (GLUMETZA) 1000 MG (MOD) 24 hr tablet, Take 1 tablet (1,000 mg total) by mouth daily., Disp: 30 tablet, Rfl: 2   methocarbamol (ROBAXIN) 500 MG tablet, Take 1 tablet (500 mg total) by mouth every 8 (eight) hours as needed for muscle spasms. (Patient not taking: Reported on 05/03/2022), Disp: 30 tablet, Rfl: 0   methylPREDNISolone  (MEDROL DOSEPAK) 4 MG TBPK tablet, Take as directed per package instructions., Disp: 21 tablet, Rfl: 0   montelukast (SINGULAIR) 10 MG tablet, Take 1 tablet (10 mg total) by mouth at bedtime for 30 days., Disp: 90 tablet, Rfl: 1   predniSONE (DELTASONE) 20 MG tablet, Take 2 tablets by mouth every day for 5 days., Disp: 10 tablet, Rfl: 0   predniSONE (STERAPRED UNI-PAK 48 TAB) 10 MG (48) TBPK tablet, Take as directed. (Patient not taking: Reported on 05/03/2022), Disp: 48 tablet, Rfl: 0   prochlorperazine (COMPAZINE) 10 MG tablet, Take 1 tablet (10 mg total) by mouth every 6 (six) hours as needed for nausea or vomiting., Disp: 30 tablet, Rfl: 11   promethazine-dextromethorphan (PROMETHAZINE-DM) 6.25-15 MG/5ML syrup, Take 5 mLs by mouth 4 (four) times daily as needed., Disp: 118 mL, Rfl: 0   Rimegepant Sulfate (NURTEC) 75 MG TBDP, Take 75 mg by mouth daily as needed. For migraines. Take as close to onset of migraine as possible. One daily maximum., Disp: 6 tablet, Rfl: 0   Medications ordered in this encounter:  No orders of the defined types were placed in this encounter.    *If you need refills on other medications prior to your next appointment, please contact your pharmacy*  Follow-Up: Call back or seek an in-person evaluation if the symptoms worsen or if the condition fails to improve as anticipated.  [facility] [phone]  Other Instructions Bacterial Conjunctivitis, Adult Bacterial conjunctivitis is an infection of your conjunctiva. This is the clear membrane that covers the white part of your eye and the inner part of your eyelid. This infection can make your eye: Red or pink. Itchy or irritated. This condition spreads easily from person to person (is contagious) and from one eye to the other eye. What are the causes? This condition is caused by germs (bacteria). You may get the infection if you come into close contact with: A person who has the infection. Items  that have germs on them (are contaminated), such as face towels, contact lens solution, or eye makeup. What increases the risk? You are more likely to get this condition if: You have contact with people who have the infection. You wear contact lenses. You have a sinus infection. You have had a recent eye injury or surgery. You have a weak body defense system (immune system). You have dry eyes. What are the signs or symptoms?  Thick, yellowish discharge from the eye. Tearing or watery eyes. Itchy eyes. Burning feeling in your eyes. Eye redness. Swollen eyelids. Blurred vision. How is this treated?  Antibiotic eye drops or ointment. Antibiotic medicine taken by mouth. This is used for infections that do not get better with drops or ointment or that last more than 10 days. Cool, wet cloths placed on the eyes. Artificial tears used 2-6 times a day. Follow these instructions at home: Medicines Take or apply your antibiotic medicine as told by your doctor. Do not stop using  it even if you start to feel better. Take or apply over-the-counter and prescription medicines only as told by your doctor. Do not touch your eyelid with the eye-drop bottle or the ointment tube. Managing discomfort Wipe any fluid from your eye with a warm, wet washcloth or a cotton ball. Place a clean, cool, wet cloth on your eye. Do this for 10-20 minutes, 3-4 times a day. General instructions Do not wear contacts until the infection is gone. Wear glasses until your doctor says it is okay to wear contacts again. Do not wear eye makeup until the infection is gone. Throw away old eye makeup. Change or wash your pillowcase every day. Do not share towels or washcloths. Wash your hands often with soap and water for at least 20 seconds and especially before touching your face or eyes. Use paper towels to dry your hands. Do not touch or rub your eyes. Do not drive or use heavy machinery if your vision is  blurred. Contact a doctor if: You have a fever. You do not get better after 10 days. Get help right away if: You have a fever and your symptoms get worse all of a sudden. You have very bad pain when you move your eye. Your face: Hurts. Is red. Is swollen. You have sudden loss of vision. Summary Bacterial conjunctivitis is an infection of your conjunctiva. This infection spreads easily from person to person. Wash your hands often with soap and water for at least 20 seconds and especially before touching your face or eyes. Use paper towels to dry your hands. Take or apply your antibiotic medicine as told by your doctor. Contact a doctor if you have a fever or you do not get better after 10 days. This information is not intended to replace advice given to you by your health care provider. Make sure you discuss any questions you have with your health care provider. Document Revised: 07/21/2020 Document Reviewed: 07/21/2020 Elsevier Patient Education  2024 Elsevier Inc.    If you have been instructed to have an in-person evaluation today at a local Urgent Care facility, please use the link below. It will take you to a list of all of our available [facility], including address, phone number and hours of operation. Please do not delay care.  [facility]  If you or a family member do not have a primary care provider, use the link below to schedule a visit and establish care. When you choose a [facility] primary care physician or advanced practice provider, you gain a long-term partner in health. Find a Primary Care Provider  Learn more about [facility]'s in-office and virtual care options: [facility] - Get Care Now

## 2022-11-20 NOTE — Progress Notes (Signed)
Virtual Visit Consent   Mary Hanson, you are scheduled for a virtual visit with a [facility] provider today. Just as with appointments in the office, your consent must be obtained to participate. Your consent will be active for this visit and any virtual visit you may have with one of our providers in the next 365 days. If you have a MyChart account, a copy of this consent can be sent to you electronically.  As this is a virtual visit, video technology does not allow for your provider to perform a traditional examination. This may limit your provider's ability to fully assess your condition. If your provider identifies any concerns that need to be evaluated in person or the need to arrange testing (such as labs, EKG, etc.), we will make arrangements to do so. Although advances in technology are sophisticated, we cannot ensure that it will always work on either your end or our end. If the connection with a video visit is poor, the visit may have to be switched to a telephone visit. With either a video or telephone visit, we are not always able to ensure that we have a secure connection.  By engaging in this virtual visit, you consent to the provision of healthcare and authorize for your insurance to be billed (if applicable) for the services provided during this visit. Depending on your insurance coverage, you may receive a charge related to this service.  I need to obtain your verbal consent now. Are you willing to proceed with your visit today? Mary Hanson has provided verbal consent on 11/20/2022 for a virtual visit (video or telephone). Mary Hanson, [place]  Date: 11/20/2022 11:39 AM  Virtual Visit via Video Note   I, Mary Hanson, connected with  Mary Hanson  ([id number], 23-Mar-1976) on 11/20/22 at 11:30 AM EDT by a video-enabled telemedicine application and verified that I am speaking with the correct person using two identifiers.  Location: Patient: Virtual Visit  Location Patient: Home Provider: Virtual Visit Location Provider: Home Office   I discussed the limitations of evaluation and management by telemedicine and the availability of in person appointments. The patient expressed understanding and agreed to proceed.    History of Present Illness: Mary Hanson is a 47 y.o. who identifies as a female who was assigned female at birth, and is being seen today for eye complaints. Endorses for past few days noted R eye itching and irritated -- thought was related to seasonal/environmental allergies. Started OTC allergy eye drop without improvement. Now with discharge from eyes bilaterally. Denies fever, chills. Denies vision changes. Does not wear contact lenses. Denies URI symptoms.   HPI: HPI  Problems:  Patient Active Problem List   Diagnosis Date Noted   Type 2 diabetes mellitus without complication, without long-term current use of insulin (HCC) 11/28/2018   Moderate persistent asthma without complication 05/16/2018   Solitary pulmonary nodule 02/10/2016   Menorrhagia 05/31/2015   Peripheral neuralgia 11/11/2014   Enlarged thyroid 04/03/2013   Unspecified vitamin D deficiency 07/29/2012   External hemorrhoid 09/06/2011   Essential hypertension 12/29/2010   Morbid obesity (HCC) 12/29/2010   Allergic rhinitis 12/29/2010   Migraine headache 12/29/2010    Allergies:  Allergies  Allergen Reactions   Mobic [Meloxicam] Swelling    Swelling of arms and legs, tightness in chest   Iodine Nausea And Vomiting   Augmentin [Amoxicillin-Pot Clavulanate] Nausea And Vomiting    Pt states was nauseated due to not eating enough  Doxycycline Other (See Comments)    Headache   Lisinopril Swelling    Lip swelling/Angioedema   Zofran [Ondansetron Hcl] Other (See Comments)    Severe headache   Medications:  Current Outpatient Medications:    trimethoprim-polymyxin b (POLYTRIM) ophthalmic solution, Apply 1-2 drops into affected eye QID x 5 days., Disp:  10 mL, Rfl: 0   albuterol (PROVENTIL) (2.5 MG/3ML) 0.083% nebulizer solution, Take 3 mLs (2.5 mg total) by nebulization every 6 (six) hours as needed for wheezing or shortness of breath., Disp: 75 mL, Rfl: 12   albuterol (VENTOLIN HFA) 108 (90 Base) MCG/ACT inhaler, Inhale 2 puffs into the lungs every 4 (four) hours as needed for wheezing or shortness of breath., Disp: 6.7 g, Rfl: 0   amLODipine (NORVASC) 10 MG tablet, Take 1 tablet (10 mg total) by mouth daily. (Patient not taking: Reported on 05/03/2022), Disp: 90 tablet, Rfl: 3   amLODipine (NORVASC) 10 MG tablet, Take 1 tablet (10 mg total) by mouth daily., Disp: 30 tablet, Rfl: 3   atovaquone-proguanil (MALARONE) 250-100 MG TABS tablet, Take 1 tablet daily. Begin 1 - 2 days before travel, daily during travel, and for 7 days after leaving., Disp: 30 tablet, Rfl: 0   eletriptan (RELPAX) 40 MG tablet, TAKE 1 TABLET BY MOUTH AS NEEDED FOR MIGRAINE OR HEADACHE. MAY REPEAT IN 2 HOURS IF HEADACHE PERSISTS OR RECURS., Disp: 9 tablet, Rfl: 11   fexofenadine (ALLEGRA ALLERGY) 180 MG tablet, Take 1 tablet (180 mg total) by mouth daily., Disp: 90 tablet, Rfl: 2   Fluticasone-Umeclidin-Vilant (TRELEGY ELLIPTA) 200-62.5-25 MCG/ACT AEPB, Inhale 2 puffs into the lungs daily., Disp: 60 each, Rfl: 3   Fluticasone-Umeclidin-Vilant (TRELEGY ELLIPTA) 200-62.5-25 MCG/ACT AEPB, Inhale 1 puff into the lungs daily., Disp: 28 each, Rfl: 0   glipiZIDE (GLUCOTROL) 5 MG tablet, Take 1 tablet (5 mg total) by mouth daily with supper., Disp: 30 tablet, Rfl: 0   hydrochlorothiazide (HYDRODIURIL) 25 MG tablet, Take 1 tablet (25 mg total) by mouth daily., Disp: 90 tablet, Rfl: 2   ibuprofen (ADVIL,MOTRIN) 200 MG tablet, Take 800 mg by mouth every 6 (six) hours as needed for headache., Disp: , Rfl:    ipratropium (ATROVENT) 0.02 % nebulizer solution, Take 2.5 mLs (0.5 mg total) by nebulization every 8 (eight) hours as needed for wheezing or shortness of breath., Disp: 75 mL, Rfl: 12    megestrol (MEGACE) 40 MG tablet, Take 1 tablet (40 mg total) by mouth 2 (two) times daily. (Patient taking differently: Take 40 mg by mouth as needed.), Disp: 30 tablet, Rfl: 2   metFORMIN (GLUCOPHAGE-XR) 500 MG 24 hr tablet, Take 2 tablets by mouth every day as directed, Disp: 60 tablet, Rfl: 2   metFORMIN (GLUMETZA) 1000 MG (MOD) 24 hr tablet, Take 1 tablet (1,000 mg total) by mouth daily., Disp: 30 tablet, Rfl: 2   methocarbamol (ROBAXIN) 500 MG tablet, Take 1 tablet (500 mg total) by mouth every 8 (eight) hours as needed for muscle spasms. (Patient not taking: Reported on 05/03/2022), Disp: 30 tablet, Rfl: 0   montelukast (SINGULAIR) 10 MG tablet, Take 1 tablet (10 mg total) by mouth at bedtime for 30 days., Disp: 90 tablet, Rfl: 1   prochlorperazine (COMPAZINE) 10 MG tablet, Take 1 tablet (10 mg total) by mouth every 6 (six) hours as needed for nausea or vomiting., Disp: 30 tablet, Rfl: 11   Rimegepant Sulfate (NURTEC) 75 MG TBDP, Take 75 mg by mouth daily as needed. For migraines. Take as close to onset  of migraine as possible. One daily maximum., Disp: 6 tablet, Rfl: 0  Observations/Objective: Patient is well-developed, well-nourished in no acute distress.  Resting comfortably at home.  Head is normocephalic, atraumatic.  No labored breathing. Speech is clear and coherent with logical content.  Patient is alert and oriented at baseline.   Assessment and Plan: 1. Acute bacterial conjunctivitis of both eyes  Supportive measures and OTC medications reviewed. Polytrim per orders. Follow-up if not resolving.   Follow Up Instructions: I discussed the assessment and treatment plan with the patient. The patient was provided an opportunity to ask questions and all were answered. The patient agreed with the plan and demonstrated an understanding of the instructions.  A copy of instructions were sent to the patient via MyChart unless otherwise noted below.   The patient was advised to call back  or seek an in-person evaluation if the symptoms worsen or if the condition fails to improve as anticipated.  Time:  I spent 10 minutes with the patient via telehealth technology discussing the above problems/concerns.    Mary Climes, PA-C

## 2022-11-22 ENCOUNTER — Other Ambulatory Visit (HOSPITAL_COMMUNITY): Payer: Self-pay

## 2022-11-23 ENCOUNTER — Other Ambulatory Visit (HOSPITAL_COMMUNITY): Payer: Self-pay

## 2022-12-19 ENCOUNTER — Other Ambulatory Visit: Payer: Self-pay | Admitting: Family Medicine

## 2022-12-19 DIAGNOSIS — Z1231 Encounter for screening mammogram for malignant neoplasm of breast: Secondary | ICD-10-CM

## 2022-12-20 ENCOUNTER — Ambulatory Visit: Payer: BC Managed Care – PPO

## 2022-12-20 DIAGNOSIS — Z1231 Encounter for screening mammogram for malignant neoplasm of breast: Secondary | ICD-10-CM | POA: Diagnosis not present

## 2022-12-22 ENCOUNTER — Other Ambulatory Visit: Payer: Self-pay

## 2022-12-25 ENCOUNTER — Other Ambulatory Visit (HOSPITAL_COMMUNITY): Payer: Self-pay

## 2023-01-04 DIAGNOSIS — J069 Acute upper respiratory infection, unspecified: Secondary | ICD-10-CM | POA: Diagnosis not present

## 2023-01-11 ENCOUNTER — Other Ambulatory Visit (HOSPITAL_COMMUNITY): Payer: Self-pay

## 2023-01-13 ENCOUNTER — Other Ambulatory Visit (HOSPITAL_COMMUNITY): Payer: Self-pay

## 2023-01-19 DIAGNOSIS — Z111 Encounter for screening for respiratory tuberculosis: Secondary | ICD-10-CM | POA: Diagnosis not present

## 2023-01-24 ENCOUNTER — Encounter: Payer: Self-pay | Admitting: Pulmonary Disease

## 2023-01-29 ENCOUNTER — Other Ambulatory Visit (HOSPITAL_COMMUNITY): Payer: Self-pay

## 2023-01-31 ENCOUNTER — Other Ambulatory Visit (HOSPITAL_COMMUNITY): Payer: Self-pay

## 2023-01-31 ENCOUNTER — Other Ambulatory Visit: Payer: Self-pay | Admitting: Pulmonary Disease

## 2023-01-31 MED ORDER — AMLODIPINE BESYLATE 10 MG PO TABS
10.0000 mg | ORAL_TABLET | Freq: Every day | ORAL | 3 refills | Status: DC
  Filled 2023-01-31 – 2023-02-20 (×2): qty 30, 30d supply, fill #0
  Filled 2023-04-26: qty 30, 30d supply, fill #1
  Filled 2023-06-05: qty 30, 30d supply, fill #2

## 2023-02-01 DIAGNOSIS — J45909 Unspecified asthma, uncomplicated: Secondary | ICD-10-CM | POA: Diagnosis not present

## 2023-02-01 DIAGNOSIS — M79605 Pain in left leg: Secondary | ICD-10-CM | POA: Diagnosis not present

## 2023-02-01 DIAGNOSIS — E119 Type 2 diabetes mellitus without complications: Secondary | ICD-10-CM | POA: Diagnosis not present

## 2023-02-01 DIAGNOSIS — M25562 Pain in left knee: Secondary | ICD-10-CM | POA: Diagnosis not present

## 2023-02-01 DIAGNOSIS — I10 Essential (primary) hypertension: Secondary | ICD-10-CM | POA: Diagnosis not present

## 2023-02-02 ENCOUNTER — Other Ambulatory Visit (HOSPITAL_COMMUNITY): Payer: Self-pay

## 2023-02-02 MED ORDER — TRELEGY ELLIPTA 200-62.5-25 MCG/ACT IN AEPB
2.0000 | INHALATION_SPRAY | Freq: Every day | RESPIRATORY_TRACT | 11 refills | Status: DC
  Filled 2023-02-02 – 2023-04-27 (×5): qty 60, 30d supply, fill #0

## 2023-02-08 ENCOUNTER — Other Ambulatory Visit (HOSPITAL_COMMUNITY): Payer: Self-pay

## 2023-02-08 DIAGNOSIS — I10 Essential (primary) hypertension: Secondary | ICD-10-CM | POA: Diagnosis not present

## 2023-02-08 MED ORDER — AMLODIPINE BESYLATE 10 MG PO TABS
10.0000 mg | ORAL_TABLET | Freq: Every day | ORAL | 4 refills | Status: DC
Start: 2023-02-08 — End: 2023-11-16
  Filled 2023-02-08 – 2023-07-21 (×2): qty 90, 90d supply, fill #0

## 2023-02-12 ENCOUNTER — Other Ambulatory Visit (HOSPITAL_COMMUNITY): Payer: Self-pay

## 2023-02-17 ENCOUNTER — Other Ambulatory Visit (HOSPITAL_COMMUNITY): Payer: Self-pay

## 2023-02-20 ENCOUNTER — Other Ambulatory Visit (HOSPITAL_COMMUNITY): Payer: Self-pay

## 2023-02-20 ENCOUNTER — Other Ambulatory Visit: Payer: Self-pay | Admitting: Pulmonary Disease

## 2023-02-20 ENCOUNTER — Other Ambulatory Visit: Payer: Self-pay

## 2023-02-22 ENCOUNTER — Other Ambulatory Visit (HOSPITAL_COMMUNITY): Payer: Self-pay

## 2023-02-23 ENCOUNTER — Other Ambulatory Visit (HOSPITAL_COMMUNITY): Payer: Self-pay

## 2023-03-05 ENCOUNTER — Other Ambulatory Visit (HOSPITAL_COMMUNITY): Payer: Self-pay

## 2023-03-08 ENCOUNTER — Other Ambulatory Visit (HOSPITAL_COMMUNITY): Payer: Self-pay

## 2023-03-14 ENCOUNTER — Other Ambulatory Visit (HOSPITAL_COMMUNITY): Payer: Self-pay

## 2023-03-21 ENCOUNTER — Other Ambulatory Visit: Payer: Self-pay

## 2023-04-27 ENCOUNTER — Other Ambulatory Visit (HOSPITAL_COMMUNITY): Payer: Self-pay

## 2023-04-29 DIAGNOSIS — J069 Acute upper respiratory infection, unspecified: Secondary | ICD-10-CM | POA: Diagnosis not present

## 2023-04-29 DIAGNOSIS — R062 Wheezing: Secondary | ICD-10-CM | POA: Diagnosis not present

## 2023-04-30 ENCOUNTER — Other Ambulatory Visit (HOSPITAL_COMMUNITY): Payer: Self-pay

## 2023-04-30 DIAGNOSIS — H109 Unspecified conjunctivitis: Secondary | ICD-10-CM | POA: Diagnosis not present

## 2023-04-30 DIAGNOSIS — B9689 Other specified bacterial agents as the cause of diseases classified elsewhere: Secondary | ICD-10-CM | POA: Diagnosis not present

## 2023-04-30 DIAGNOSIS — J019 Acute sinusitis, unspecified: Secondary | ICD-10-CM | POA: Diagnosis not present

## 2023-05-01 ENCOUNTER — Other Ambulatory Visit (HOSPITAL_COMMUNITY): Payer: Self-pay

## 2023-05-02 ENCOUNTER — Other Ambulatory Visit: Payer: Self-pay

## 2023-05-02 ENCOUNTER — Telehealth: Payer: Self-pay | Admitting: Neurology

## 2023-05-02 ENCOUNTER — Telehealth: Payer: Self-pay | Admitting: Pulmonary Disease

## 2023-05-02 ENCOUNTER — Other Ambulatory Visit (HOSPITAL_COMMUNITY): Payer: Self-pay

## 2023-05-02 NOTE — Telephone Encounter (Signed)
 Pt states her Rx expired per pharmacy and needs a refill of Rimegepant Sulfate (NURTEC) 75 MG TBDP  Send to  [facility]

## 2023-05-02 NOTE — Telephone Encounter (Signed)
 Need PFT order put in per last AVS

## 2023-05-02 NOTE — Telephone Encounter (Signed)
 Upon review of chart, pt hasn't been seen since June 2023 and I don't see where Dr Lucia Gaskins had ever sent Nurtec to her pharmacy. Patient will need an appointment first before we can provide any prescriptions.

## 2023-05-02 NOTE — Telephone Encounter (Signed)
 Pt called to schedule needed appt. Wait listed

## 2023-05-05 NOTE — Telephone Encounter (Signed)
 PFT was ordered and has been completed.

## 2023-05-08 DIAGNOSIS — E119 Type 2 diabetes mellitus without complications: Secondary | ICD-10-CM | POA: Diagnosis not present

## 2023-06-12 DIAGNOSIS — E119 Type 2 diabetes mellitus without complications: Secondary | ICD-10-CM | POA: Diagnosis not present

## 2023-06-15 ENCOUNTER — Other Ambulatory Visit (HOSPITAL_COMMUNITY): Payer: Self-pay

## 2023-06-15 ENCOUNTER — Emergency Department (HOSPITAL_BASED_OUTPATIENT_CLINIC_OR_DEPARTMENT_OTHER): Payer: BC Managed Care – PPO

## 2023-06-15 ENCOUNTER — Emergency Department (HOSPITAL_BASED_OUTPATIENT_CLINIC_OR_DEPARTMENT_OTHER)
Admission: EM | Admit: 2023-06-15 | Discharge: 2023-06-15 | Disposition: A | Discharge status: Home / Self Care | Payer: BC Managed Care – PPO | Attending: Emergency Medicine | Admitting: Emergency Medicine

## 2023-06-15 ENCOUNTER — Telehealth (HOSPITAL_BASED_OUTPATIENT_CLINIC_OR_DEPARTMENT_OTHER): Payer: Self-pay | Admitting: Emergency Medicine

## 2023-06-15 ENCOUNTER — Other Ambulatory Visit (HOSPITAL_BASED_OUTPATIENT_CLINIC_OR_DEPARTMENT_OTHER): Payer: Self-pay

## 2023-06-15 ENCOUNTER — Other Ambulatory Visit: Payer: Self-pay

## 2023-06-15 ENCOUNTER — Encounter (HOSPITAL_BASED_OUTPATIENT_CLINIC_OR_DEPARTMENT_OTHER): Payer: Self-pay | Admitting: Emergency Medicine

## 2023-06-15 DIAGNOSIS — G43019 Migraine without aura, intractable, without status migrainosus: Secondary | ICD-10-CM | POA: Insufficient documentation

## 2023-06-15 DIAGNOSIS — I10 Essential (primary) hypertension: Secondary | ICD-10-CM | POA: Insufficient documentation

## 2023-06-15 DIAGNOSIS — E119 Type 2 diabetes mellitus without complications: Secondary | ICD-10-CM | POA: Insufficient documentation

## 2023-06-15 DIAGNOSIS — Z79899 Other long term (current) drug therapy: Secondary | ICD-10-CM | POA: Insufficient documentation

## 2023-06-15 DIAGNOSIS — E876 Hypokalemia: Secondary | ICD-10-CM | POA: Diagnosis not present

## 2023-06-15 DIAGNOSIS — G43009 Migraine without aura, not intractable, without status migrainosus: Secondary | ICD-10-CM

## 2023-06-15 DIAGNOSIS — Z7984 Long term (current) use of oral hypoglycemic drugs: Secondary | ICD-10-CM | POA: Diagnosis not present

## 2023-06-15 DIAGNOSIS — J019 Acute sinusitis, unspecified: Secondary | ICD-10-CM | POA: Insufficient documentation

## 2023-06-15 DIAGNOSIS — R519 Headache, unspecified: Secondary | ICD-10-CM | POA: Diagnosis not present

## 2023-06-15 LAB — COMPREHENSIVE METABOLIC PANEL
ALT: 16 U/L (ref 0–44)
AST: 14 U/L — ABNORMAL LOW (ref 15–41)
Albumin: 3.4 g/dL — ABNORMAL LOW (ref 3.5–5.0)
Alkaline Phosphatase: 95 U/L (ref 38–126)
Anion gap: 7 (ref 5–15)
BUN: 15 mg/dL (ref 6–20)
CO2: 31 mmol/L (ref 22–32)
Calcium: 8.8 mg/dL — ABNORMAL LOW (ref 8.9–10.3)
Chloride: 97 mmol/L — ABNORMAL LOW (ref 98–111)
Creatinine, Ser: 0.66 mg/dL (ref 0.44–1.00)
GFR, Estimated: >60 mL/min (ref >60)
Glucose, Bld: 135 mg/dL — ABNORMAL HIGH (ref 70–99)
Potassium: 3.4 mmol/L — ABNORMAL LOW (ref 3.5–5.1)
Sodium: 135 mmol/L (ref 135–145)
Total Bilirubin: 0.4 mg/dL (ref 0.0–1.2)
Total Protein: 7.7 g/dL (ref 6.5–8.1)

## 2023-06-15 LAB — CBC WITH DIFFERENTIAL/PLATELET
Abs Immature Granulocytes: 0.05 10*3/uL (ref 0.00–0.07)
Basophils Absolute: 0 10*3/uL (ref 0.0–0.1)
Basophils Relative: 1 %
Eosinophils Absolute: 0.4 10*3/uL (ref 0.0–0.5)
Eosinophils Relative: 6 %
HCT: 40.9 % (ref 36.0–46.0)
Hemoglobin: 12.8 g/dL (ref 12.0–15.0)
Immature Granulocytes: 1 %
Lymphocytes Relative: 25 %
Lymphs Abs: 2 10*3/uL (ref 0.7–4.0)
MCH: 26.5 pg (ref 26.0–34.0)
MCHC: 31.3 g/dL (ref 30.0–36.0)
MCV: 84.7 fL (ref 80.0–100.0)
Monocytes Absolute: 0.5 10*3/uL (ref 0.1–1.0)
Monocytes Relative: 6 %
Neutro Abs: 4.8 10*3/uL (ref 1.7–7.7)
Neutrophils Relative %: 61 %
Platelets: 273 10*3/uL (ref 150–400)
RBC: 4.83 MIL/uL (ref 3.87–5.11)
RDW: 14.8 % (ref 11.5–15.5)
WBC: 7.8 10*3/uL (ref 4.0–10.5)
nRBC: 0 % (ref 0.0–0.2)

## 2023-06-15 MED ORDER — DIPHENHYDRAMINE HCL 50 MG/ML IJ SOLN
12.5000 mg | Freq: Once | INTRAMUSCULAR | Status: AC
  Administered 2023-06-15: 12.5 mg via INTRAVENOUS
  Filled 2023-06-15: qty 1

## 2023-06-15 MED ORDER — LACTATED RINGERS IV BOLUS
1000.0000 mL | Freq: Once | INTRAVENOUS | Status: AC
  Administered 2023-06-15: 1000 mL via INTRAVENOUS

## 2023-06-15 MED ORDER — PROCHLORPERAZINE EDISYLATE 10 MG/2ML IJ SOLN
10.0000 mg | Freq: Once | INTRAMUSCULAR | Status: AC
  Administered 2023-06-15: 10 mg via INTRAVENOUS
  Filled 2023-06-15: qty 2

## 2023-06-15 MED ORDER — KETOROLAC TROMETHAMINE 15 MG/ML IJ SOLN
15.0000 mg | Freq: Once | INTRAMUSCULAR | Status: AC
  Administered 2023-06-15: 15 mg via INTRAVENOUS
  Filled 2023-06-15: qty 1

## 2023-06-15 MED ORDER — AMOXICILLIN-POT CLAVULANATE 875-125 MG PO TABS
1.0000 | ORAL_TABLET | Freq: Two times a day (BID) | ORAL | 0 refills | Status: AC
  Filled 2023-06-15: qty 14, 7d supply, fill #0

## 2023-06-15 MED ORDER — FLUCONAZOLE 100 MG PO TABS
150.0000 mg | ORAL_TABLET | Freq: Every day | ORAL | 0 refills | Status: DC
  Filled 2023-06-15: qty 2, 1d supply, fill #0

## 2023-06-15 MED ORDER — FLUTICASONE PROPIONATE 50 MCG/ACT NA SUSP
2.0000 | Freq: Every day | NASAL | Status: DC
  Administered 2023-06-15: 2 via NASAL
  Filled 2023-06-15: qty 16

## 2023-06-15 NOTE — Discharge Instructions (Addendum)
 Your CT head is reassuring. It does not show any masses or brain bleeds. It did show possible sinusitis.  I have included the CT results below.  You have been prescribed Augmentin. Take this antibiotic 2 times a day for the next 10 days. Take the full course of your antibiotic even if you start feeling better. Antibiotics may cause you to have diarrhea.  Your calcium is slightly low here today.  Please increase dietary intake of calcium with food such as dairy products.  Your potassium was slightly low here today, please increase your dietary intake of potassium with foods such as bananas, avocados. You may also use the Flonase given today to help with your sinusitis/congestion.  You may use 2 puffs in each nostril daily help with congestion.  You may take up to 1000mg  of tylenol every 6 hours as needed for pain.  Do not take more then 4g per day.  You may use up to 600mg  ibuprofen every 6 hours as needed for pain.  Do not exceed 2.4g of ibuprofen per day.  You may continue taking your sumatriptan as prescribed for headaches.  Try to limit use of tylenol and ibuprofen to less than 3 days per week to prevent rebound headaches.   Your blood pressure was low here today at 158/104.  Please follow-up with your PCP within the next month to have a blood pressure recheck and for further management of your blood pressures.   Return to the ER if you have severe nausea vomiting, uncontrolled headache, any other new or concerning symptoms.  "EXAM: CT HEAD WITHOUT CONTRAST   TECHNIQUE: Contiguous axial images were obtained from the base of the skull through the vertex without intravenous contrast.   RADIATION DOSE REDUCTION: This exam was performed according to the departmental dose-optimization program which includes automated exposure control, adjustment of the mA and/or kV according to patient size and/or use of iterative reconstruction technique.   COMPARISON:  CT head February 07, 2013.    FINDINGS: Brain: No evidence of acute large vascular territory infarction, hemorrhage, hydrocephalus, extra-axial collection or mass lesion/mass effect.   Vascular: No hyperdense vessel.   Skull: No acute fracture.   Sinuses/Orbits: Moderate paranasal sinus mucosal thickening. No acute orbital findings.   Other: No mastoid effusions.   IMPRESSION: 1. No acute intracranial abnormality. 2. Moderate paranasal sinus mucosal thickening.     Electronically Signed   By: Feliberto Harts M.D.   On: 06/15/2023 13:14"

## 2023-06-15 NOTE — ED Notes (Signed)
 Patient transported to CT

## 2023-06-15 NOTE — ED Notes (Signed)
 Patient given discharge instructions. Questions were answered. Patient verbalized understanding of discharge instructions and care at home.

## 2023-06-15 NOTE — ED Provider Notes (Signed)
 [facility] Provider Note   CSN: [id number] Arrival date & time: 06/15/23  1054     History  Chief Complaint  Patient presents with   Migraine    Mary Hanson is a 48 y.o. female with history of migraines, hypertension, diabetes, presents with concern for a migraine that has been ongoing for the past 5 days.  Reports the headache is in the front of her head and behind her eyes bilaterally.  Denies any sudden onset, falls, MVC's, or other trauma.  She reports she frequently has migraines, but they usually do not last this long.  She has tried Excedrin, Motrin, Tylenol, and sumatriptan at home.  States these medications relieve her headache for couple hours, but then it returns.  Denies any fever chills, neck pain or stiffness.   Migraine       Home Medications Prior to Admission medications   Medication Sig Start Date End Date Taking? Authorizing Provider  amoxicillin-clavulanate (AUGMENTIN) 875-125 MG tablet Take 1 tablet by mouth every 12 (twelve) hours for 7 days. 06/15/23 06/22/23 Yes Arabella Merles, PA-C  albuterol (PROVENTIL) (2.5 MG/3ML) 0.083% nebulizer solution Take 3 mLs (2.5 mg total) by nebulization every 6 (six) hours as needed for wheezing or shortness of breath. 10/11/21   Edwin Dada P, DO  albuterol (VENTOLIN HFA) 108 (90 Base) MCG/ACT inhaler Inhale 2 puffs into the lungs every 4 (four) hours as needed for wheezing or shortness of breath. 12/09/21   Bing Neighbors, NP  amLODipine (NORVASC) 10 MG tablet Take 1 tablet (10 mg total) by mouth daily. Patient not taking: Reported on 05/03/2022 11/28/18   Reva Bores, MD  amLODipine (NORVASC) 10 MG tablet Take 1 tablet (10 mg total) by mouth daily. 01/31/23     amLODipine (NORVASC) 10 MG tablet Take 1 tablet (10 mg total) by mouth daily. 02/08/23     atovaquone-proguanil (MALARONE) 250-100 MG TABS tablet Take 1 tablet daily. Begin 1 - 2 days before travel, daily during  travel, and for 7 days after leaving. 09/13/22     eletriptan (RELPAX) 40 MG tablet TAKE 1 TABLET BY MOUTH AS NEEDED FOR MIGRAINE OR HEADACHE. MAY REPEAT IN 2 HOURS IF HEADACHE PERSISTS OR RECURS. 10/05/21   Anson Fret, MD  fexofenadine [facility] ALLERGY) 180 MG tablet Take 1 tablet (180 mg total) by mouth daily. 01/14/19   Caro Laroche, DO  Fluticasone-Umeclidin-Vilant (TRELEGY ELLIPTA) 200-62.5-25 MCG/ACT AEPB Inhale 1 puff into the lungs daily. 01/26/22   Tomma Lightning, MD  Fluticasone-Umeclidin-Vilant (TRELEGY ELLIPTA) 200-62.5-25 MCG/ACT AEPB Inhale 2 puffs into the lungs daily. 02/02/23   Olalere, Onnie Boer A, MD  glipiZIDE (GLUCOTROL) 5 MG tablet Take 1 tablet (5 mg total) by mouth daily with supper. 12/09/21   Bing Neighbors, NP  hydrochlorothiazide (HYDRODIURIL) 25 MG tablet Take 1 tablet (25 mg total) by mouth daily. 06/25/22     ibuprofen (ADVIL,MOTRIN) 200 MG tablet Take 800 mg by mouth every 6 (six) hours as needed for headache.    [provider]  ipratropium (ATROVENT) 0.02 % nebulizer solution Take 2.5 mLs (0.5 mg total) by nebulization every 8 (eight) hours as needed for wheezing or shortness of breath. 10/11/21   Edwin Dada P, DO  megestrol (MEGACE) 40 MG tablet Take 1 tablet (40 mg total) by mouth 2 (two) times daily. Patient taking differently: Take 40 mg by mouth as needed. 06/08/22   Reva Bores, MD  metFORMIN (GLUCOPHAGE-XR) 500  MG 24 hr tablet Take 2 tablets by mouth every day as directed 08/03/22     metFORMIN (GLUMETZA) 1000 MG (MOD) 24 hr tablet Take 1 tablet (1,000 mg total) by mouth daily. 08/03/22     methocarbamol (ROBAXIN) 500 MG tablet Take 1 tablet (500 mg total) by mouth every 8 (eight) hours as needed for muscle spasms. Patient not taking: Reported on 05/03/2022 12/30/20   Magnant, Joycie Peek, PA-C  montelukast (SINGULAIR) 10 MG tablet Take 1 tablet (10 mg total) by mouth at bedtime for 30 days. 05/16/18 10/08/20  Shon Hale, MD   prochlorperazine (COMPAZINE) 10 MG tablet Take 1 tablet (10 mg total) by mouth every 6 (six) hours as needed for nausea or vomiting. 10/05/21   Anson Fret, MD  Rimegepant Sulfate (NURTEC) 75 MG TBDP Take 75 mg by mouth daily as needed. For migraines. Take as close to onset of migraine as possible. One daily maximum. 10/05/21   Anson Fret, MD  trimethoprim-polymyxin b (POLYTRIM) ophthalmic solution Apply 1-2 drops into affected eye QID x 5 days. 11/20/22   Waldon Merl, PA-C      Allergies    Mobic [meloxicam], Iodine, Augmentin [amoxicillin-pot clavulanate], Doxycycline, Lisinopril, and Zofran [ondansetron hcl]    Review of Systems   Review of Systems  HENT:         Headache    Physical Exam Updated Vital Signs BP (!) 165/82 (BP Location: Right Arm)   Pulse 86   Temp 97.6 F (36.4 C) (Oral)   Resp 20   Ht 5\' 6"  (1.676 m)   Wt (!) 141.1 kg   SpO2 99%   BMI 50.20 kg/m  Physical Exam Vitals and nursing note reviewed.  Constitutional:      General: She is not in acute distress.    Appearance: She is well-developed.  HENT:     Head: Normocephalic and atraumatic.  Eyes:     Extraocular Movements: Extraocular movements intact.     Conjunctiva/sclera: Conjunctivae normal.     Pupils: Pupils are equal, round, and reactive to light.  Neck:     Comments: Able to bend and rotate neck left and right without difficulty, no meningismus  Cardiovascular:     Rate and Rhythm: Normal rate and regular rhythm.     Heart sounds: No murmur heard. Pulmonary:     Effort: Pulmonary effort is normal. No respiratory distress.     Breath sounds: Normal breath sounds.  Abdominal:     Palpations: Abdomen is soft.     Tenderness: There is no abdominal tenderness.  Musculoskeletal:        General: No swelling.     Cervical back: Neck supple.  Skin:    General: Skin is warm and dry.     Capillary Refill: Capillary refill takes less than 2 seconds.  Neurological:     Mental  Status: She is alert.     Comments: Cranial nerves III through XII intact  5/5 strength of the bilateral upper and lower extremities  Psychiatric:        Mood and Affect: Mood normal.     ED Results / Procedures / Treatments   Labs (all labs ordered are listed, but only abnormal results are displayed) Labs Reviewed  COMPREHENSIVE METABOLIC PANEL - Abnormal; Notable for the following components:      Result Value   Potassium 3.4 (*)    Chloride 97 (*)    Glucose, Bld 135 (*)    Calcium  8.8 (*)    Albumin 3.4 (*)    AST 14 (*)    All other components within normal limits  CBC WITH DIFFERENTIAL/PLATELET  HCG, SERUM, QUALITATIVE    EKG None  Radiology CT Head Wo Contrast Result Date: 06/15/2023 CLINICAL DATA:  Headache, increasing frequency or severity EXAM: CT HEAD WITHOUT CONTRAST TECHNIQUE: Contiguous axial images were obtained from the base of the skull through the vertex without intravenous contrast. RADIATION DOSE REDUCTION: This exam was performed according to the departmental dose-optimization program which includes automated exposure control, adjustment of the mA and/or kV according to patient size and/or use of iterative reconstruction technique. COMPARISON:  CT head February 07, 2013. FINDINGS: Brain: No evidence of acute large vascular territory infarction, hemorrhage, hydrocephalus, extra-axial collection or mass lesion/mass effect. Vascular: No hyperdense vessel. Skull: No acute fracture. Sinuses/Orbits: Moderate paranasal sinus mucosal thickening. No acute orbital findings. Other: No mastoid effusions. IMPRESSION: 1. No acute intracranial abnormality. 2. Moderate paranasal sinus mucosal thickening. Electronically Signed   By: Feliberto Harts M.D.   On: 06/15/2023 13:14    Procedures Procedures    Medications Ordered in ED Medications  fluticasone (FLONASE) 50 MCG/ACT nasal spray 2 spray (2 sprays Each Nare Given 06/15/23 1403)  lactated ringers bolus 1,000 mL (0  mLs Intravenous Stopped 06/15/23 1452)  prochlorperazine (COMPAZINE) injection 10 mg (10 mg Intravenous Given 06/15/23 1400)  diphenhydrAMINE (BENADRYL) injection 12.5 mg (12.5 mg Intravenous Given 06/15/23 1359)  ketorolac (TORADOL) 15 MG/ML injection 15 mg (15 mg Intravenous Given 06/15/23 1359)    ED Course/ Medical Decision Making/ A&P                                 Medical Decision Making Amount and/or Complexity of Data Reviewed Labs: ordered. Radiology: ordered.  Risk Prescription drug management.     Differential diagnosis includes but is not limited to Tension headache, migraine, trigeminal neuralgia, cluster headache, meningitis, concussion, intracranial mass, intracranial hemorrhage, carbon monoxide poisoning, sinus venous thrombosis   ED Course:  Patient well-appearing, no acute distress.  Stable vitals aside from an elevated blood pressure 150/104.  She reports migraine in the front of her head for the past 5 days.  Patient does not have any neck pain, no fevers or chills, no meningismus on exam, low concern for meningitis at this time.  No history of MVC or trauma, low concern for intracranial hemorrhage.   Although she does report a history of migraines, usually they respond to over-the-counter medications.  CT head was obtained which showed no acute intracranial abnormality but moderate paranasal sinus mucosal thickening.  Given report of pain around her sinuses, this raises concern for sinusitis.  Will treat for sinusitis with prescription for Augmentin Patient given LR bolus, Compazine, Benadryl, and Toradol for her headache.  Upon reevaluation, states her headache has greatly improved.  I Ordered, and personally interpreted labs.  The pertinent results include:   CBC without any abnormalities CMP with hypokalemia at 3.4, hypocalcemia at 8.8    Impression: Migraine headache, now resolved Sinusitis  Disposition:  The patient was discharged home with instructions  to take 7-day course of Augmentin as prescribed.  May take Flonase daily as needed for congestion.  May continue to take Tylenol and ibuprofen as needed for headache, recommended she not take these medications more than 3 days a week to prevent rebound headaches.  May continue to take her sumatriptan as prescribed for headaches  as needed.  Follow-up with PCP for recheck of her blood pressures since they were elevated today. Return precautions given.  Imaging Studies ordered: I ordered imaging studies including CT head I independently visualized the imaging with scope of interpretation limited to determining acute life threatening conditions related to emergency care. Imaging showed no acute intracranial abnormalities, paranasal mucosal thickening I agree with the radiologist interpretation                Final Clinical Impression(s) / ED Diagnoses Final diagnoses:  Acute sinusitis, recurrence not specified, unspecified location  Migraine without aura and without status migrainosus, not intractable    Rx / DC Orders ED Discharge Orders          Ordered    amoxicillin-clavulanate (AUGMENTIN) 875-125 MG tablet  Every 12 hours        06/15/23 1505              Arabella Merles, PA-C 06/15/23 1516    Cathren Laine, MD 06/17/23 [phone]

## 2023-06-15 NOTE — Telephone Encounter (Cosign Needed)
 Realized I did not send in Diflucan for patient.

## 2023-06-15 NOTE — ED Notes (Signed)
es

## 2023-06-15 NOTE — ED Triage Notes (Addendum)
 Pt POV steady gait- reports migraine x5 days, got imitrex from PCP Tuesday, helped for a few hours.  Anterior aeadache is waxing and waning. Denies URI sx, fever.    Took 500 mg tylenol, 600 mg motrin appx 0630 today, little relief.

## 2023-06-18 ENCOUNTER — Other Ambulatory Visit (HOSPITAL_COMMUNITY): Payer: Self-pay

## 2023-06-18 MED ORDER — AMOXICILLIN 875 MG PO TABS
875.0000 mg | ORAL_TABLET | Freq: Two times a day (BID) | ORAL | 0 refills | Status: DC
  Filled 2023-06-18: qty 20, 10d supply, fill #0

## 2023-06-28 ENCOUNTER — Other Ambulatory Visit (HOSPITAL_COMMUNITY): Payer: Self-pay

## 2023-07-06 ENCOUNTER — Other Ambulatory Visit: Payer: Self-pay

## 2023-07-06 ENCOUNTER — Other Ambulatory Visit (HOSPITAL_BASED_OUTPATIENT_CLINIC_OR_DEPARTMENT_OTHER): Payer: Self-pay

## 2023-07-06 DIAGNOSIS — J4550 Severe persistent asthma, uncomplicated: Secondary | ICD-10-CM

## 2023-07-10 ENCOUNTER — Ambulatory Visit (HOSPITAL_BASED_OUTPATIENT_CLINIC_OR_DEPARTMENT_OTHER): Admitting: Pulmonary Disease

## 2023-07-10 DIAGNOSIS — J4550 Severe persistent asthma, uncomplicated: Secondary | ICD-10-CM

## 2023-07-10 LAB — PULMONARY FUNCTION TEST
DL/VA % pred: 172 %
DL/VA: 7.37 ml/min/mmHg/L
DLCO cor % pred: 136 %
DLCO cor: 30.89 ml/min/mmHg
DLCO unc % pred: 133 %
DLCO unc: 30.31 ml/min/mmHg
FEF 25-75 Post: 1.16 L/s
FEF 25-75 Pre: 0.69 L/s
FEF2575-%Change-Post: 68 %
FEF2575-%Pred-Post: 39 %
FEF2575-%Pred-Pre: 23 %
FEV1-%Change-Post: 15 %
FEV1-%Pred-Post: 54 %
FEV1-%Pred-Pre: 47 %
FEV1-Post: 1.67 L
FEV1-Pre: 1.44 L
FEV1FVC-%Change-Post: 7 %
FEV1FVC-%Pred-Pre: 77 %
FEV6-%Change-Post: 7 %
FEV6-%Pred-Post: 65 %
FEV6-%Pred-Pre: 61 %
FEV6-Post: 2.46 L
FEV6-Pre: 2.28 L
FEV6FVC-%Change-Post: 0 %
FEV6FVC-%Pred-Post: 102 %
FEV6FVC-%Pred-Pre: 101 %
FVC-%Change-Post: 7 %
FVC-%Pred-Post: 64 %
FVC-%Pred-Pre: 60 %
FVC-Post: 2.47 L
FVC-Pre: 2.3 L
Post FEV1/FVC ratio: 67 %
Post FEV6/FVC ratio: 99 %
Pre FEV1/FVC ratio: 63 %
Pre FEV6/FVC Ratio: 99 %
RV % pred: 147 %
RV: 2.72 L
TLC % pred: 103 %
TLC: 5.54 L

## 2023-07-10 NOTE — Progress Notes (Signed)
 Full PFT Performed Today

## 2023-07-10 NOTE — Patient Instructions (Signed)
 Full PFT Performed Today

## 2023-07-11 DIAGNOSIS — J45901 Unspecified asthma with (acute) exacerbation: Secondary | ICD-10-CM | POA: Diagnosis not present

## 2023-07-11 DIAGNOSIS — T300 Burn of unspecified body region, unspecified degree: Secondary | ICD-10-CM | POA: Diagnosis not present

## 2023-07-12 ENCOUNTER — Ambulatory Visit (INDEPENDENT_AMBULATORY_CARE_PROVIDER_SITE_OTHER): Payer: BC Managed Care – PPO | Admitting: Pulmonary Disease

## 2023-07-12 ENCOUNTER — Other Ambulatory Visit: Payer: Self-pay

## 2023-07-12 ENCOUNTER — Other Ambulatory Visit (HOSPITAL_COMMUNITY): Payer: Self-pay

## 2023-07-12 ENCOUNTER — Encounter: Payer: Self-pay | Admitting: Pulmonary Disease

## 2023-07-12 VITALS — BP 118/74 | HR 88 | Ht 66.0 in | Wt 311.4 lb

## 2023-07-12 DIAGNOSIS — R0602 Shortness of breath: Secondary | ICD-10-CM

## 2023-07-12 DIAGNOSIS — E119 Type 2 diabetes mellitus without complications: Secondary | ICD-10-CM | POA: Diagnosis not present

## 2023-07-12 DIAGNOSIS — J45909 Unspecified asthma, uncomplicated: Secondary | ICD-10-CM | POA: Diagnosis not present

## 2023-07-12 DIAGNOSIS — J4550 Severe persistent asthma, uncomplicated: Secondary | ICD-10-CM

## 2023-07-12 MED ORDER — TRELEGY ELLIPTA 200-62.5-25 MCG/ACT IN AEPB
1.0000 | INHALATION_SPRAY | Freq: Every day | RESPIRATORY_TRACT | 5 refills | Status: AC
  Filled 2023-07-12: qty 60, 30d supply, fill #0
  Filled 2023-09-24: qty 60, 30d supply, fill #1
  Filled 2023-11-26 – 2023-12-26 (×2): qty 60, 30d supply, fill #2
  Filled 2024-02-05: qty 60, 30d supply, fill #3
  Filled 2024-03-19 – 2024-04-10 (×2): qty 60, 30d supply, fill #4
  Filled 2024-05-26 (×2): qty 60, 30d supply, fill #5

## 2023-07-12 MED ORDER — ALBUTEROL SULFATE HFA 108 (90 BASE) MCG/ACT IN AERS
2.0000 | INHALATION_SPRAY | RESPIRATORY_TRACT | 5 refills | Status: AC | PRN
  Filled 2023-07-12: qty 6.7, 16d supply, fill #0
  Filled 2023-07-12: qty 6.7, 17d supply, fill #0
  Filled 2023-10-09 – 2023-11-19 (×2): qty 6.7, 16d supply, fill #1
  Filled 2023-12-26: qty 6.7, 16d supply, fill #2
  Filled 2024-02-05: qty 6.7, 16d supply, fill #3

## 2023-07-12 NOTE — Progress Notes (Signed)
 JOHNANNA Hanson    [id number]    [birth year]  Primary Care Physician:Shackleford, Lovell Sheehan, MD  Referring Physician: Patrecia Pour, MD [address] [place],  [place] [address]  Chief complaint:   Follow-up for asthma  HPI:  Has been stable since her last visit  Symptoms are better controlled with Trelegy  Did have a history of COVID that may have contributed to worsening symptoms  She has been doing very well  Diagnosed with asthma in her 48s, rarely used inhalers in the past  The last time she was tested, she actually tested negative but most of her symptoms were suggestive of COVID and she was treated for the same  History of hypertension, diabetes  On medications for allergies   Outpatient Encounter Medications as of 07/12/2023  Medication Sig   albuterol (PROVENTIL) (2.5 MG/3ML) 0.083% nebulizer solution Take 3 mLs (2.5 mg total) by nebulization every 6 (six) hours as needed for wheezing or shortness of breath.   albuterol (VENTOLIN HFA) 108 (90 Base) MCG/ACT inhaler Inhale 2 puffs into the lungs every 4 (four) hours as needed for wheezing or shortness of breath.   amLODipine (NORVASC) 10 MG tablet Take 1 tablet (10 mg total) by mouth daily.   amLODipine (NORVASC) 10 MG tablet Take 1 tablet (10 mg total) by mouth daily.   amLODipine (NORVASC) 10 MG tablet Take 1 tablet (10 mg total) by mouth daily.   eletriptan (RELPAX) 40 MG tablet TAKE 1 TABLET BY MOUTH AS NEEDED FOR MIGRAINE OR HEADACHE. MAY REPEAT IN 2 HOURS IF HEADACHE PERSISTS OR RECURS.   fexofenadine (ALLEGRA ALLERGY) 180 MG tablet Take 1 tablet (180 mg total) by mouth daily.   Fluticasone-Umeclidin-Vilant (TRELEGY ELLIPTA) 200-62.5-25 MCG/ACT AEPB Inhale 1 puff into the lungs daily.   Fluticasone-Umeclidin-Vilant (TRELEGY ELLIPTA) 200-62.5-25 MCG/ACT AEPB Inhale 2 puffs into the lungs daily.   hydrochlorothiazide (HYDRODIURIL) 25 MG tablet Take 1 tablet (25 mg total) by mouth  daily.   ibuprofen (ADVIL,MOTRIN) 200 MG tablet Take 800 mg by mouth every 6 (six) hours as needed for headache.   megestrol (MEGACE) 40 MG tablet Take 1 tablet (40 mg total) by mouth 2 (two) times daily. (Patient taking differently: Take 40 mg by mouth as needed.)   metFORMIN (GLUCOPHAGE-XR) 500 MG 24 hr tablet Take 2 tablets by mouth every day as directed   methocarbamol (ROBAXIN) 500 MG tablet Take 1 tablet (500 mg total) by mouth every 8 (eight) hours as needed for muscle spasms.   prochlorperazine (COMPAZINE) 10 MG tablet Take 1 tablet (10 mg total) by mouth every 6 (six) hours as needed for nausea or vomiting.   ipratropium (ATROVENT) 0.02 % nebulizer solution Take 2.5 mLs (0.5 mg total) by nebulization every 8 (eight) hours as needed for wheezing or shortness of breath. (Patient not taking: Reported on 07/12/2023)   montelukast (SINGULAIR) 10 MG tablet Take 1 tablet (10 mg total) by mouth at bedtime for 30 days.   [DISCONTINUED] amoxicillin (AMOXIL) 875 MG tablet Take 1 tablet by mouth twice a day   [DISCONTINUED] atovaquone-proguanil (MALARONE) 250-100 MG TABS tablet Take 1 tablet daily. Begin 1 - 2 days before travel, daily during travel, and for 7 days after leaving.   [DISCONTINUED] fluconazole (DIFLUCAN) 100 MG tablet Take 1.5 tablets (150 mg total) by mouth daily. Take at once if you develop symptoms of yeast infection. (Patient not taking: Reported on 07/12/2023)   [DISCONTINUED] glipiZIDE (GLUCOTROL) 5  MG tablet Take 1 tablet (5 mg total) by mouth daily with supper.   [DISCONTINUED] metFORMIN (GLUMETZA) 1000 MG (MOD) 24 hr tablet Take 1 tablet (1,000 mg total) by mouth daily. (Patient not taking: Reported on 07/12/2023)   [DISCONTINUED] Rimegepant Sulfate (NURTEC) 75 MG TBDP Take 75 mg by mouth daily as needed. For migraines. Take as close to onset of migraine as possible. One daily maximum. (Patient not taking: Reported on 07/12/2023)   [DISCONTINUED] trimethoprim-polymyxin b (POLYTRIM)  ophthalmic solution Apply 1-2 drops into affected eye QID x 5 days.   No facility-administered encounter medications on file as of 07/12/2023.    Allergies as of 07/12/2023 - Review Complete 07/12/2023  Allergen Reaction Noted   Mobic [meloxicam] Swelling 05/28/2020   Iodine Nausea And Vomiting 11/19/2014   Augmentin [amoxicillin-pot clavulanate] Nausea And Vomiting 11/11/2014   Doxycycline Other (See Comments) 12/13/2021   Lisinopril Swelling 01/11/2015   Zofran [ondansetron hcl] Other (See Comments) 01/16/2014    Past Medical History:  Diagnosis Date   Allergy-induced asthma    prn inhaler   Diabetes mellitus without complication (HCC)    History of anemia    no current problem, per pt.   Hypertension    under control with med., has been on med. x 1 yr.   Migraines    Obesity    Pyogenic granuloma 12/2013   right ring finger   Seasonal allergies     Past Surgical History:  Procedure Laterality Date   BREAST BIOPSY Right 04/24/2018   Rt br bx @  [facility]   CERVICAL CONE BIOPSY  age 48   CHOLECYSTECTOMY  2006 or 2007   DILATION AND CURETTAGE OF UTERUS     DILATION AND EVACUATION  age 79   MASS EXCISION Right 01/16/2014   Procedure: EXCISION PYOGENIC GRANULOMA FROM RIGHT RING FINGER;  Surgeon: Dairl Ponder, MD;  Location: [facility];  Service: Orthopedics;  Laterality: Right;    Family History  Problem Relation Age of Onset   Hypertension Mother    Diabetes Father    Diabetes type II Father    Hypertension Father    Stroke Father    Goiter Sister 63       identical twin   Asthma Sister    Hypertension Sister    Stroke Maternal Grandfather    Diabetes Paternal Grandfather    Heart disease Paternal Grandfather    Migraines Neg Hx     Social History   Socioeconomic History   Marital status: Single    Spouse name: Not on file   Number of children: 0   Years of education: Ba/Grad [place]   Highest education level: Not on  file  Occupational History    Employer: [employer]  Tobacco Use   Smoking status: Never    Passive exposure: Never   Smokeless tobacco: Never  Vaping Use   Vaping status: Never Used  Substance and Sexual Activity   Alcohol use: Not Currently    Comment: Wine once a year    Drug use: No   Sexual activity: Yes    Birth control/protection: Condom, Pill    Comment: quit BCP whils on megace 7/14  Other Topics Concern   Not on file  Social History Narrative   Lives at home with herself.   Right handed.   Caffeine use: Drinks soda daily (Drinks three 20oz per day)   Social Drivers of Corporate investment banker Strain: Not on file  Food Insecurity: Not on file  Transportation Needs: Not on file  Physical Activity: Not on file  Stress: Not on file  Social Connections: Unknown (09/02/2021)   Received from [facility], [facility]   Social Network    Social Network: Not on file  Intimate Partner Violence: Unknown (07/26/2021)   [facility], [facility]   HITS    Physically Hurt: Not on file    Insult or Talk Down To: Not on file    Threaten Physical Harm: Not on file    Scream or Curse: Not on file    Review of Systems  Respiratory:  Positive for cough and shortness of breath.     Vitals:   07/12/23 1412  BP: 118/74  Pulse: 88  SpO2: 90%     Physical Exam Constitutional:      Appearance: She is obese.  HENT:     Head: Normocephalic.     Mouth/Throat:     Mouth: Mucous membranes are moist.  Eyes:     General: No scleral icterus. Cardiovascular:     Rate and Rhythm: Normal rate and regular rhythm.     Heart sounds: No murmur heard.    No friction rub.  Pulmonary:     Effort: No respiratory distress.     Breath sounds: No stridor. No wheezing or rhonchi.  Musculoskeletal:     Cervical back: No rigidity or tenderness.  Neurological:     Mental Status: She is alert.  Psychiatric:        Mood and Affect: Mood normal.    Data  Reviewed: Most recent chest x-ray 12/22/2021-no acute infiltrate  PFT with severe obstructive disease with significant bronchodilator response -Repeat PFT unchanged compared to most recent  Assessment:  Asthma with controlled symptoms -Encouraged to continue Trelegy  Class III obesity  History of hypertension  Diabetes  Abnormal pulmonary function test with obstructive lung disease  Plan/Recommendations:  Continue Trelegy  Continue graded activities as tolerated  Encouraged to give [place] a call with significant concerns  Importance of weight management, regular exercises was discussed with the patient  Follow-up a year from now   Virl Diamond MD [facility] 07/12/2023, 2:30 PM  CC: Shackleford, [place]*

## 2023-07-12 NOTE — Patient Instructions (Signed)
 Will refill Trelegy  Sending refills for your albuterol  Graded activities as tolerated  Your breathing study is stable from previous  I will see you a year from now

## 2023-07-21 ENCOUNTER — Other Ambulatory Visit (HOSPITAL_COMMUNITY): Payer: Self-pay

## 2023-07-23 ENCOUNTER — Other Ambulatory Visit (HOSPITAL_COMMUNITY): Payer: Self-pay

## 2023-07-24 ENCOUNTER — Encounter (HOSPITAL_BASED_OUTPATIENT_CLINIC_OR_DEPARTMENT_OTHER)

## 2023-08-06 DIAGNOSIS — J019 Acute sinusitis, unspecified: Secondary | ICD-10-CM | POA: Diagnosis not present

## 2023-08-06 DIAGNOSIS — B9689 Other specified bacterial agents as the cause of diseases classified elsewhere: Secondary | ICD-10-CM | POA: Diagnosis not present

## 2023-08-06 DIAGNOSIS — T3695XA Adverse effect of unspecified systemic antibiotic, initial encounter: Secondary | ICD-10-CM | POA: Diagnosis not present

## 2023-08-06 DIAGNOSIS — B379 Candidiasis, unspecified: Secondary | ICD-10-CM | POA: Diagnosis not present

## 2023-08-23 ENCOUNTER — Telehealth: Payer: Self-pay | Admitting: Neurology

## 2023-08-23 NOTE — Telephone Encounter (Signed)
 Earlier appointment accepted by pt

## 2023-08-27 ENCOUNTER — Ambulatory Visit: Admitting: Neurology

## 2023-08-27 ENCOUNTER — Other Ambulatory Visit (HOSPITAL_COMMUNITY): Payer: Self-pay

## 2023-08-27 ENCOUNTER — Encounter: Payer: Self-pay | Admitting: Neurology

## 2023-08-27 VITALS — BP 133/89 | HR 84 | Ht 66.0 in | Wt 308.2 lb

## 2023-08-27 DIAGNOSIS — G43109 Migraine with aura, not intractable, without status migrainosus: Secondary | ICD-10-CM

## 2023-08-27 DIAGNOSIS — M25561 Pain in right knee: Secondary | ICD-10-CM | POA: Diagnosis not present

## 2023-08-27 DIAGNOSIS — J302 Other seasonal allergic rhinitis: Secondary | ICD-10-CM | POA: Diagnosis not present

## 2023-08-27 DIAGNOSIS — M2241 Chondromalacia patellae, right knee: Secondary | ICD-10-CM | POA: Diagnosis not present

## 2023-08-27 DIAGNOSIS — S86812A Strain of other muscle(s) and tendon(s) at lower leg level, left leg, initial encounter: Secondary | ICD-10-CM | POA: Diagnosis not present

## 2023-08-27 MED ORDER — PROCHLORPERAZINE MALEATE 10 MG PO TABS
10.0000 mg | ORAL_TABLET | Freq: Four times a day (QID) | ORAL | 11 refills | Status: AC | PRN
Start: 2023-08-27 — End: ?
  Filled 2023-08-27 – 2023-09-27 (×2): qty 30, 8d supply, fill #0

## 2023-08-27 MED ORDER — ELETRIPTAN HYDROBROMIDE 40 MG PO TABS
40.0000 mg | ORAL_TABLET | ORAL | 11 refills | Status: AC | PRN
Start: 2023-08-27 — End: ?
  Filled 2023-08-27 – 2023-09-27 (×2): qty 9, 23d supply, fill #0

## 2023-08-27 MED ORDER — NURTEC 75 MG PO TBDP
75.0000 mg | ORAL_TABLET | Freq: Every day | ORAL | 11 refills | Status: DC | PRN
  Filled 2023-08-27 – 2023-08-31 (×2): qty 16, 16d supply, fill #0

## 2023-08-27 NOTE — Patient Instructions (Signed)
 At onset of headache take Relpax (4 hour half life), can take it with an excedrin  or ibuprofen /alleve or tylenol , can also take compazine  for nausea and also try Nurtec; and can play with combining these medications right at onset of migraine. Also Nurtec has a 12 hour half life so if you think you may have a migraine due to menses or any other suspicion of migraine coming you can a nurtec and see if stops it from occurring.  Rimegepant Disintegrating Tablets What is this medication? RIMEGEPANT (ri ME je pant) prevents and treats migraines. It works by blocking a substance in the body that causes migraines. This medicine may be used for other purposes; ask your health care provider or pharmacist if you have questions. COMMON BRAND NAME(S): NURTEC ODT What should I tell my care team before I take this medication? They need to know if you have any of these conditions: Kidney disease Liver disease An unusual or allergic reaction to rimegepant, other medications, foods, dyes, or preservatives Pregnant or trying to get pregnant Breast-feeding How should I use this medication? Take this medication by mouth. Take it as directed on the prescription label. Leave the tablet in the sealed pack until you are ready to take it. With dry hands, open the pack and gently remove the tablet. If the tablet breaks or crumbles, throw it away. Use a new tablet. Place the tablet in the mouth and allow it to dissolve. Then, swallow it. Do not cut, crush, or chew this medication. You do not need water to take this medication. Talk to your care team about the use of this medication in children. Special care may be needed. Overdosage: If you think you have taken too much of this medicine contact a poison control center or emergency room at once. NOTE: This medicine is only for you. Do not share this medicine with others. What if I miss a dose? This does not apply. This medication is not for regular use. What may interact  with this medication? Certain medications for fungal infections, such as fluconazole , itraconazole Rifampin This list may not describe all possible interactions. Give your health care provider a list of all the medicines, herbs, non-prescription drugs, or dietary supplements you use. Also tell them if you smoke, drink alcohol, or use illegal drugs. Some items may interact with your medicine. What should I watch for while using this medication? Visit your care team for regular checks on your progress. Tell your care team if your symptoms do not start to get better or if they get worse. What side effects may I notice from receiving this medication? Side effects that you should report to your care team as soon as possible: Allergic reactions--skin rash, itching, hives, swelling of the face, lips, tongue, or throat Side effects that usually do not require medical attention (report to your care team if they continue or are bothersome): Nausea Stomach pain This list may not describe all possible side effects. Call your doctor for medical advice about side effects. You may report side effects to FDA at 1-800-FDA-1088. Where should I keep my medication? Keep out of the reach of children and pets. Store at room temperature between 20 and 25 degrees C (68 and 77 degrees F). Get rid of any unused medication after the expiration date. To get rid of medications that are no longer needed or have expired: Take the medication to a medication take-back program. Check with your pharmacy or law enforcement to find a location. If you cannot  return the medication, check the label or package insert to see if the medication should be thrown out in the garbage or flushed down the toilet. If you are not sure, ask your care team. If it is safe to put it in the trash, take the medication out of the container. Mix the medication with cat litter, dirt, coffee grounds, or other unwanted substance. Seal the mixture in a bag or  container. Put it in the trash. NOTE: This sheet is a summary. It may not cover all possible information. If you have questions about this medicine, talk to your doctor, pharmacist, or health care provider.  2024 Elsevier/Gold Standard (2021-06-01 00:00:00)Eletriptan  Tablets What is this medication? ELETRIPTAN  (el ih TRIP tan) treats migraines. It works by blocking pain signals and narrowing blood vessels in the brain. It belongs to a group of medications called triptans. It is not used to prevent migraines. This medicine may be used for other purposes; ask your health care provider or pharmacist if you have questions. COMMON BRAND NAME(S): Relpax  What should I tell my care team before I take this medication? They need to know if you have any of these conditions: Circulation problems in fingers and toes Diabetes Heart disease High blood pressure High cholesterol History of irregular heartbeat History of stroke Kidney disease Liver disease Stomach or intestine problems Tobacco use An unusual or allergic reaction to eletriptan , other medications, foods, dyes, or preservatives Pregnant or trying to get pregnant Breastfeeding How should I use this medication? Take this medication by mouth with a glass of water. Take it as directed on the prescription label. Do not take it more often than directed. Keep taking it unless your care team tells you to stop. Talk to your care team about the use of this medication in children. Special care may be needed. Overdosage: If you think you have taken too much of this medicine contact a poison control center or emergency room at once. NOTE: This medicine is only for you. Do not share this medicine with others. What if I miss a dose? This does not apply. This medication is not for regular use. What may interact with this medication? Do not take this medication with any of the following: Adagrasib Ceritinib Certain antibiotics, such as clarithromycin  or telithromycin Certain antivirals for HIV or hepatitis Certain medications for fungal infections, such as ketoconazole, itraconazole, or posaconazole Certain medications for migraine headache, such as almotriptan, frovatriptan, naratriptan, rizatriptan, sumatriptan , zolmitriptan Chloramphenicol Ergot alkaloids, such as dihydroergotamine, ergonovine, ergotamine, methylergonovine Idelalisib Mifepristone Nefazodone Ribociclib This medication may also interact with the following: Certain medications for depression, anxiety, or mental health conditions MAOIs, such as Carbex, Eldepryl, Marplan, Nardil, and Parnate This list may not describe all possible interactions. Give your health care provider a list of all the medicines, herbs, non-prescription drugs, or dietary supplements you use. Also tell them if you smoke, drink alcohol, or use illegal drugs. Some items may interact with your medicine. What should I watch for while using this medication? Visit your care team for regular checks on your progress. Tell your care team if your symptoms do not start to get better or if they get worse. This medication may affect your coordination, reaction time, or judgment. Do not drive or operate machinery until you know how this medication affects you. Sit up or stand slowly to reduce the risk of dizzy or fainting spells. Drinking alcohol with this medication can increase the risk of these side effects. Your mouth may get dry.  Chewing sugarless gum or sucking hard candy and drinking plenty of water may help. Contact your care team if the problem does not go away or is severe. If you take migraine medications for 10 or more days a month, your migraines may get worse. Keep a diary of headache days and medication use. Contact your care team if your migraine attacks occur more frequently. What side effects may I notice from receiving this medication? Side effects that you should report to your care team as soon as  possible: Allergic reactions--skin rash, itching, hives, swelling of the face, lips, tongue, or throat Burning, pain, tingling, or color changes in the legs or feet Heart attack--pain or tightness in the chest, shoulders, arms, or jaw, nausea, shortness of breath, cold or clammy skin, feeling faint or lightheaded Heart rhythm changes--fast or irregular heartbeat, dizziness, feeling faint or lightheaded, chest pain, trouble breathing Increase in blood pressure Irritability, confusion, fast or irregular heartbeat, muscle stiffness, twitching muscles, sweating, high fever, seizure, chills, vomiting, diarrhea, which may be signs of serotonin syndrome Raynaud's--cool, numb, or painful fingers or toes that may change color from pale, to blue, to red Seizures Stroke--sudden numbness or weakness of the face, arm, or leg, trouble speaking, confusion, trouble walking, loss of balance or coordination, dizziness, severe headache, change in vision Sudden or severe stomach pain, nausea, vomiting, fever, or bloody diarrhea Vision loss Side effects that usually do not require medical attention (report to your care team if they continue or are bothersome): Dizziness General discomfort or fatigue This list may not describe all possible side effects. Call your doctor for medical advice about side effects. You may report side effects to FDA at 1-800-FDA-1088. Where should I keep my medication? Keep out of the reach of children and pets. Store at room temperature between 15 and 30 degrees C (59 and 86 degrees F). Throw away any unused medication after the expiration date. NOTE: This sheet is a summary. It may not cover all possible information. If you have questions about this medicine, talk to your doctor, pharmacist, or health care provider.  2024 Elsevier/Gold Standard (2021-09-09 00:00:00)

## 2023-08-27 NOTE — Progress Notes (Signed)
 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 Migranes have bee just ok. Screen time worsens her migraines. Her migraines have been not bad. Relpax  helped when she had migraines. She hasn;t had one as bad she had to use relpax . She takes excedrin  1-2x a month. She likes relpax . Discussed At onset of headache take Relpax (4 hour half life), can take it with an excedrin  or ibuprofen /alleve or tylenol , can also take compazine  for nausea and also try Nurtec; and can play with combining these medications right at onset of migraine. Also Nurtec has a 12 hour half life so if you think you may have a migraine due to menses or any other suspicion of migraine coming you can a nurtec and see if stops it from occurring. No other focal neurologic deficits, associated symptoms, inciting events or modifiable factors.  Patient complains of symptoms per HPI as well as the following symptoms: none . Pertinent negatives and positives per HPI. All others negative  HPI 10/05/2021:  Mary Hanson is a 48 y.o. female here as requested by Tracy Friedlander* for migraines. has Essential hypertension; Morbid obesity (HCC); Allergic rhinitis; Migraine headache; External hemorrhoid; Vitamin D  deficiency; Enlarged thyroid ; Peripheral neuralgia; Menorrhagia; Solitary pulmonary nodule; Moderate persistent asthma without complication; and Type 2 diabetes mellitus without complication, without long-term current use of insulin (HCC) on their problem list.   She is having them more frequently around her cycle. We saw her last in 2016-2017, remains episodic, She usually takes excedrin /ibuprofen . She gets 2-3 migraine days a month. They can last 24 hours. Light sensitiivty, pulsating/pounding/throbbing, sleep helps, relpax  works, she gets nausea badly, no  vomiting, hurts to move, zofran  gives her bad headaches. No other focal neurologic deficits, associated symptoms, inciting events or modifiable factors. Not exertional or positional, no changes in quality or severity, no red flags for imaging at this time, no vision changes.   Reviewed notes, labs and imaging from outside physicians, which showed:  From a thorough review of records, medications tried imitrex  and relpax  and didn't work, also [place]  and treximet , Solu-Medrol , Robaxin , Tylenol , amlodipine , Excedrin , Fioricet, Flexeril , Decadron , diclofenac  tabs, Relpax , ibuprofen , ketorolac  injections, lisinopril , Reglan , naproxen , prednisone  and Compazine .  CT head 02/07/2013:  showed No acute intracranial abnormalities including mass lesion or mass effect, hydrocephalus, extra-axial fluid collection, midline shift, hemorrhage, or acute infarction, large ischemic events (personally reviewed images)    Review of Systems: Patient complains of symptoms per HPI as well as the following symptoms migraines. Pertinent negatives and positives per HPI. All others negative.   Social History   Socioeconomic History   Marital status: Single    Spouse name: Not on file   Number of children: 0   Years of education: Ba/Grad [employer]   Highest education level: Not on file  Occupational History    Employer: [employer]  Tobacco Use   Smoking status: Never    Passive exposure: Never   Smokeless tobacco: Never  Vaping Use   Vaping status: Never Used  Substance and Sexual Activity   Alcohol use: Not Currently    Comment: Wine once a year    Drug use: No   Sexual activity: Yes    Birth control/protection: Condom, Pill    Comment: quit BCP whils on megace  7/14  Other Topics Concern   Not on file  Social History Narrative  Lives at home with herself.   Right handed.   Caffeine  use: Drinks soda daily (Drinks three 20oz per day)   Social Drivers of Corporate investment banker Strain: Not on file  Food  Insecurity: Not on file  Transportation Needs: Not on file  Physical Activity: Not on file  Stress: Not on file  Social Connections: Unknown (09/02/2021)   Received from [facility], [facility]   Social Network    Social Network: Not on file  Intimate Partner Violence: Unknown (07/26/2021)   Received from [employer], [facility]   HITS    Physically Hurt: Not on file    Insult or Talk Down To: Not on file    Threaten Physical Harm: Not on file    Scream or Curse: Not on file    Family History  Problem Relation Age of Onset   Hypertension Mother    Diabetes Father    Diabetes type II Father    Hypertension Father    Stroke Father    Goiter Sister 57       identical twin   Asthma Sister    Hypertension Sister    Stroke Maternal Grandfather    Diabetes Paternal Grandfather    Heart disease Paternal Grandfather    Migraines Neg Hx     Past Medical History:  Diagnosis Date   Allergy-induced asthma    prn inhaler   Diabetes mellitus without complication (HCC)    History of anemia    no current problem, per pt.   Hypertension    under control with med., has been on med. x 1 yr.   Migraines    Obesity    Pyogenic granuloma 12/2013   right ring finger   Seasonal allergies     Patient Active Problem List   Diagnosis Date Noted   Type 2 diabetes mellitus without complication, without long-term current use of insulin (HCC) 11/28/2018   Moderate persistent asthma without complication 05/16/2018   Solitary pulmonary nodule 02/10/2016   Menorrhagia 05/31/2015   Peripheral neuralgia 11/11/2014   Enlarged thyroid  04/03/2013   Vitamin D  deficiency 07/29/2012   External hemorrhoid 09/06/2011   Essential hypertension 12/29/2010   Morbid obesity (HCC) 12/29/2010   Allergic rhinitis 12/29/2010   Migraine headache 12/29/2010    Past Surgical History:  Procedure Laterality Date   BREAST BIOPSY Right 04/24/2018   Rt br bx @  [facility]   CERVICAL  CONE BIOPSY  age 11   CHOLECYSTECTOMY  2006 or 2007   DILATION AND CURETTAGE OF UTERUS     DILATION AND EVACUATION  age 24   MASS EXCISION Right 01/16/2014   Procedure: EXCISION PYOGENIC GRANULOMA FROM RIGHT RING FINGER;  Surgeon: Florida Hurter, MD;  Location: [facility];  Service: Orthopedics;  Laterality: Right;    Current Outpatient Medications  Medication Sig Dispense Refill   albuterol  (PROVENTIL ) (2.5 MG/3ML) 0.083% nebulizer solution Take 3 mLs (2.5 mg total) by nebulization every 6 (six) hours as needed for wheezing or shortness of breath. 75 mL 12   albuterol  (VENTOLIN  HFA) 108 (90 Base) MCG/ACT inhaler Inhale 2 puffs into the lungs every 4 (four) hours as needed for wheezing or shortness of breath. 6.7 g 5   amLODipine  (NORVASC ) 10 MG tablet Take 1 tablet (10 mg total) by mouth daily. 90 tablet 4   eletriptan  (RELPAX ) 40 MG tablet Take 1 tablet (40 mg total) by mouth as needed for migraine or headache. May repeat  in 2 hours if headache persists or recurs. 9 tablet 11   fexofenadine  (ALLEGRA  ALLERGY) 180 MG tablet Take 1 tablet (180 mg total) by mouth daily. 90 tablet 2   Fluticasone -Umeclidin-Vilant (TRELEGY ELLIPTA ) 200-62.5-25 MCG/ACT AEPB Inhale 1 puff into the lungs daily. 60 each 5   hydrochlorothiazide  (HYDRODIURIL ) 25 MG tablet Take 1 tablet (25 mg total) by mouth daily. 90 tablet 2   ibuprofen  (ADVIL ,MOTRIN ) 200 MG tablet Take 800 mg by mouth every 6 (six) hours as needed for headache.     metFORMIN  (GLUCOPHAGE -XR) 500 MG 24 hr tablet Take 2 tablets by mouth every day as directed 60 tablet 2   Rimegepant Sulfate (NURTEC) 75 MG TBDP Take 1 tablet (75 mg total) by mouth daily as needed. For migraines. Take as close to onset of migraine as possible. One daily maximum. Please run copay card: BIN [id number] PCN CN GRP [id number] ID [id number] EXP 04/23/2024 16 tablet 11   montelukast  (SINGULAIR ) 10 MG tablet Take 1 tablet (10 mg total) by mouth at bedtime for 30 days.  90 tablet 1   prochlorperazine  (COMPAZINE ) 10 MG tablet Take 1 tablet (10 mg total) by mouth every 6 (six) hours as needed for nausea or vomiting. 30 tablet 11   No current facility-administered medications for this visit.    Allergies as of 08/27/2023 - Review Complete 08/27/2023  Allergen Reaction Noted   Mobic [meloxicam] Swelling 05/28/2020   Iodine Nausea And Vomiting 11/19/2014   Augmentin  [amoxicillin -pot clavulanate] Nausea And Vomiting 11/11/2014   Doxycycline  Other (See Comments) 12/13/2021   Lisinopril  Swelling 01/11/2015   Zofran  [ondansetron  hcl] Other (See Comments) 01/16/2014    Vitals: BP 133/89   Pulse 84   Ht 5\' 6"  (1.676 m)   Wt (!) 308 lb 3.2 oz (139.8 kg)   BMI 49.74 kg/m  Last Weight:  Wt Readings from Last 1 Encounters:  08/27/23 (!) 308 lb 3.2 oz (139.8 kg)   Last Height:   Ht Readings from Last 1 Encounters:  08/27/23 5\' 6"  (1.676 m)    Physical exam: Exam: Gen: NAD, conversant      CV: No palpitations or chest pain or SOB. VS: Breathing at a normal rate. obese. Not febrile. Eyes: Conjunctivae clear without exudates or hemorrhage  Neuro: Detailed Neurologic Exam  Speech:    Speech is normal; fluent and spontaneous with normal comprehension.  Cognition:    The patient is oriented to person, place, and time;     recent and remote Mary intact;     language fluent;     normal attention, concentration, fund of knowledge Cranial Nerves:    The pupils are equal, round, and reactive to light. Visual fields are full Extraocular movements are intact.  The face is symmetric with normal sensation. The palate elevates in the midline. Hearing intact. Voice is normal. Shoulder shrug is normal. The tongue has normal motion without fasciculations.   Coordination: normal  Gait:    No abnormalities noted or reported  Motor Observation:   no involuntary movements noted. Tone:    Appears normal  Posture:    Posture is normal. normal erect     Strength:    Strength is anti-gravity and symmetric in the upper and lower limbs.      Sensation: intact to LT, no reports of numbness or tingling or paresthesias         Assessment/Plan: LOVELY Patient with episodic migraines. Doing well, continue current management.  At onset of headache take Relpax (4 hour half  life), can take it with an excedrin  or ibuprofen /alleve or tylenol , can also take compazine  for nausea and also try Nurtec; and can play with combining these medications right at onset of migraine. Also Nurtec has a 12 hour half life so if you think you may have a migraine due to menses or any other suspicion of migraine coming you can a nurtec and see if stops it from occurring.    Meds ordered this encounter  Medications   prochlorperazine  (COMPAZINE ) 10 MG tablet    Sig: Take 1 tablet (10 mg total) by mouth every 6 (six) hours as needed for nausea or vomiting.    Dispense:  30 tablet    Refill:  11   eletriptan  (RELPAX ) 40 MG tablet    Sig: Take 1 tablet (40 mg total) by mouth as needed for migraine or headache. May repeat in 2 hours if headache persists or recurs.    Dispense:  9 tablet    Refill:  11   Rimegepant Sulfate (NURTEC) 75 MG TBDP    Sig: Take 1 tablet (75 mg total) by mouth daily as needed. For migraines. Take as close to onset of migraine as possible. One daily maximum. Please run copay card: BIN [id number] PCN CN GRP [id number] ID [id number] EXP 04/23/2024    Dispense:  16 tablet    Refill:  11    Please run copay card: BIN [id number] PCN CN GRP [id number] ID [id number] EXP 04/23/2024    Cc: Shackleford, Jacqualine Mater*,  Shackleford, Betsy Brown, MD  Aldona Amel, MD  [facility] [address] [place], [place] [address]  Phone [phone] Fax [phone]  I spent 20 minutes of face-to-face and non-face-to-face time with patient on the  1. Migraine with aura and without status migrainosus, not intractable     diagnosis.  This included previsit chart review, lab review, study review, order entry, electronic health record documentation, patient education on the different diagnostic and therapeutic options, counseling and coordination of care, risks and benefits of management, compliance, or risk factor reduction

## 2023-08-28 ENCOUNTER — Other Ambulatory Visit (HOSPITAL_COMMUNITY): Payer: Self-pay

## 2023-08-30 ENCOUNTER — Telehealth: Payer: Self-pay | Admitting: Pharmacist

## 2023-08-30 ENCOUNTER — Other Ambulatory Visit (HOSPITAL_COMMUNITY): Payer: Self-pay

## 2023-08-30 NOTE — Telephone Encounter (Signed)
 Sherian Dimitri from [place] PA dept. Has called to repor denial due to not being pharmacy formulary, Florette Hurry is suggested, call back # is [phone] option 3 then option 1

## 2023-08-30 NOTE — Telephone Encounter (Signed)
 Pharmacy Patient Advocate Encounter   Received notification from Patient Pharmacy that prior authorization for Nurtec 75MG  dispersible tablets is required/requested.   Insurance verification completed.   The patient is insured through [facility] .   Per test claim: PA required; PA submitted to above mentioned insurance via CoverMyMeds Key/confirmation #/EOC [id number] Status is pending

## 2023-08-31 ENCOUNTER — Other Ambulatory Visit (HOSPITAL_COMMUNITY): Payer: Self-pay

## 2023-08-31 NOTE — Telephone Encounter (Signed)
 Pharmacy Patient Advocate Encounter  Received notification from [facility] that Prior Authorization for Nurtec 75MG  dispersible tablets has been DENIED.  See denial reason below. No denial letter attached in CMM. Will attach denial letter to Media tab once received.   PA #/Case ID/Reference #: [id number]  Based on previous note, Mary Hanson is preferred by the insurance.

## 2023-09-06 ENCOUNTER — Other Ambulatory Visit (HOSPITAL_COMMUNITY): Payer: Self-pay

## 2023-09-06 ENCOUNTER — Ambulatory Visit: Payer: 59 | Admitting: Neurology

## 2023-09-06 DIAGNOSIS — M25461 Effusion, right knee: Secondary | ICD-10-CM | POA: Diagnosis not present

## 2023-09-06 DIAGNOSIS — M25561 Pain in right knee: Secondary | ICD-10-CM | POA: Diagnosis not present

## 2023-09-10 ENCOUNTER — Encounter: Payer: Self-pay | Admitting: Neurology

## 2023-09-11 ENCOUNTER — Ambulatory Visit: Admitting: Pulmonary Disease

## 2023-09-18 ENCOUNTER — Telehealth: Payer: Self-pay | Admitting: Pharmacist

## 2023-09-18 ENCOUNTER — Other Ambulatory Visit (HOSPITAL_COMMUNITY): Payer: Self-pay

## 2023-09-18 MED ORDER — UBRELVY 100 MG PO TABS
ORAL_TABLET | ORAL | 2 refills | Status: AC
  Filled 2023-09-18: qty 16, 30d supply, fill #0

## 2023-09-18 NOTE — Telephone Encounter (Signed)
 Pharmacy Patient Advocate Encounter   Received notification from Patient Pharmacy that prior authorization for Ubrelvy 100MG  tablets is required/requested.   Insurance verification completed.   The patient is insured through [facility] .   Per test claim: PA required; PA submitted to above mentioned insurance via CoverMyMeds Key/confirmation #/EOC [facility] Status is pending

## 2023-09-19 DIAGNOSIS — M1711 Unilateral primary osteoarthritis, right knee: Secondary | ICD-10-CM | POA: Diagnosis not present

## 2023-09-19 NOTE — Telephone Encounter (Signed)
 Pharmacy Patient Advocate Encounter  Received notification from [facility] that Prior Authorization for Ubrelvy 100MG  tablets has been APPROVED from 09/18/2023 to 08/192025   PA #/Case ID/Reference #: [id number]

## 2023-09-24 ENCOUNTER — Other Ambulatory Visit (HOSPITAL_COMMUNITY): Payer: Self-pay

## 2023-09-24 ENCOUNTER — Other Ambulatory Visit: Payer: Self-pay

## 2023-09-24 ENCOUNTER — Other Ambulatory Visit (HOSPITAL_BASED_OUTPATIENT_CLINIC_OR_DEPARTMENT_OTHER): Payer: Self-pay

## 2023-09-24 DIAGNOSIS — E119 Type 2 diabetes mellitus without complications: Secondary | ICD-10-CM | POA: Diagnosis not present

## 2023-09-24 DIAGNOSIS — I10 Essential (primary) hypertension: Secondary | ICD-10-CM | POA: Diagnosis not present

## 2023-09-24 MED ORDER — HYDROCHLOROTHIAZIDE 25 MG PO TABS
25.0000 mg | ORAL_TABLET | Freq: Every day | ORAL | 0 refills | Status: DC
  Filled 2023-09-24: qty 30, 30d supply, fill #0

## 2023-09-24 MED ORDER — METFORMIN HCL ER 500 MG PO TB24
500.0000 mg | ORAL_TABLET | Freq: Two times a day (BID) | ORAL | 0 refills | Status: DC
Start: 2023-09-24 — End: 2024-01-07
  Filled 2023-09-24: qty 60, 30d supply, fill #0

## 2023-09-25 ENCOUNTER — Other Ambulatory Visit (HOSPITAL_COMMUNITY): Payer: Self-pay

## 2023-09-27 ENCOUNTER — Other Ambulatory Visit (HOSPITAL_COMMUNITY): Payer: Self-pay

## 2023-10-09 ENCOUNTER — Other Ambulatory Visit (HOSPITAL_COMMUNITY): Payer: Self-pay

## 2023-10-09 ENCOUNTER — Other Ambulatory Visit: Payer: Self-pay

## 2023-10-10 ENCOUNTER — Other Ambulatory Visit (HOSPITAL_COMMUNITY): Payer: Self-pay

## 2023-10-10 DIAGNOSIS — J019 Acute sinusitis, unspecified: Secondary | ICD-10-CM | POA: Diagnosis not present

## 2023-10-10 MED ORDER — CEFDINIR 300 MG PO CAPS
300.0000 mg | ORAL_CAPSULE | Freq: Two times a day (BID) | ORAL | 0 refills | Status: DC
  Filled 2023-10-10: qty 14, 7d supply, fill #0

## 2023-10-11 ENCOUNTER — Other Ambulatory Visit (HOSPITAL_COMMUNITY): Payer: Self-pay

## 2023-10-12 ENCOUNTER — Other Ambulatory Visit (HOSPITAL_COMMUNITY): Payer: Self-pay

## 2023-10-19 ENCOUNTER — Other Ambulatory Visit (HOSPITAL_COMMUNITY): Payer: Self-pay

## 2023-10-23 ENCOUNTER — Ambulatory Visit: Admitting: Neurology

## 2023-10-23 ENCOUNTER — Other Ambulatory Visit (HOSPITAL_COMMUNITY): Payer: Self-pay

## 2023-11-16 ENCOUNTER — Other Ambulatory Visit (HOSPITAL_COMMUNITY): Payer: Self-pay

## 2023-11-16 ENCOUNTER — Encounter: Payer: Self-pay | Admitting: Nurse Practitioner

## 2023-11-16 ENCOUNTER — Ambulatory Visit (INDEPENDENT_AMBULATORY_CARE_PROVIDER_SITE_OTHER): Admitting: Nurse Practitioner

## 2023-11-16 VITALS — BP 134/82 | HR 70 | Temp 98.1°F | Ht 66.0 in | Wt 305.8 lb

## 2023-11-16 DIAGNOSIS — I10 Essential (primary) hypertension: Secondary | ICD-10-CM

## 2023-11-16 DIAGNOSIS — E559 Vitamin D deficiency, unspecified: Secondary | ICD-10-CM

## 2023-11-16 DIAGNOSIS — J4540 Moderate persistent asthma, uncomplicated: Secondary | ICD-10-CM | POA: Diagnosis not present

## 2023-11-16 DIAGNOSIS — Z7984 Long term (current) use of oral hypoglycemic drugs: Secondary | ICD-10-CM

## 2023-11-16 DIAGNOSIS — Z1211 Encounter for screening for malignant neoplasm of colon: Secondary | ICD-10-CM

## 2023-11-16 DIAGNOSIS — G43001 Migraine without aura, not intractable, with status migrainosus: Secondary | ICD-10-CM

## 2023-11-16 DIAGNOSIS — E119 Type 2 diabetes mellitus without complications: Secondary | ICD-10-CM

## 2023-11-16 LAB — COMPREHENSIVE METABOLIC PANEL WITH GFR
ALT: 8 U/L (ref 0–35)
AST: 8 U/L (ref 0–37)
Albumin: 3.9 g/dL (ref 3.5–5.2)
Alkaline Phosphatase: 100 U/L (ref 39–117)
BUN: 13 mg/dL (ref 6–23)
CO2: 30 meq/L (ref 19–32)
Calcium: 8.9 mg/dL (ref 8.4–10.5)
Chloride: 101 meq/L (ref 96–112)
Creatinine, Ser: 0.69 mg/dL (ref 0.40–1.20)
GFR: 102.63 mL/min (ref >60.00)
Glucose, Bld: 144 mg/dL — ABNORMAL HIGH (ref 70–99)
Potassium: 3.8 meq/L (ref 3.5–5.1)
Sodium: 139 meq/L (ref 135–145)
Total Bilirubin: 0.4 mg/dL (ref 0.2–1.2)
Total Protein: 7.1 g/dL (ref 6.0–8.3)

## 2023-11-16 LAB — VITAMIN D 25 HYDROXY (VIT D DEFICIENCY, FRACTURES): VITD: 33.1 ng/mL (ref 30.00–100.00)

## 2023-11-16 LAB — MICROALBUMIN / CREATININE URINE RATIO
Creatinine,U: 160.5 mg/dL
Microalb Creat Ratio: UNDETERMINED mg/g (ref 0.0–30.0)
Microalb, Ur: 0.7 mg/dL (ref 0.0–1.9)

## 2023-11-16 LAB — TSH: TSH: 2 u[IU]/mL (ref 0.35–5.50)

## 2023-11-16 LAB — POCT GLYCOSYLATED HEMOGLOBIN (HGB A1C): Hemoglobin A1C: 8.2 % — AB (ref 4.0–5.6)

## 2023-11-16 LAB — CBC
HCT: 38.3 % (ref 36.0–46.0)
Hemoglobin: 12.6 g/dL (ref 12.0–15.0)
MCHC: 32.7 g/dL (ref 30.0–36.0)
MCV: 83.3 fl (ref 78.0–100.0)
Platelets: 311 K/uL (ref 150.0–400.0)
RBC: 4.6 Mil/uL (ref 3.87–5.11)
RDW: 15 % (ref 11.5–15.5)
WBC: 7.5 K/uL (ref 4.0–10.5)

## 2023-11-16 MED ORDER — HYDROCHLOROTHIAZIDE 25 MG PO TABS
25.0000 mg | ORAL_TABLET | Freq: Every day | ORAL | 3 refills | Status: AC
  Filled 2023-11-16: qty 90, 90d supply, fill #0
  Filled 2024-02-05: qty 90, 90d supply, fill #1
  Filled 2024-05-26: qty 90, 90d supply, fill #2

## 2023-11-16 MED ORDER — AMLODIPINE BESYLATE 10 MG PO TABS
10.0000 mg | ORAL_TABLET | Freq: Every day | ORAL | 3 refills | Status: AC
  Filled 2023-11-16: qty 90, 90d supply, fill #0
  Filled 2024-02-05: qty 90, 90d supply, fill #1
  Filled 2024-05-26: qty 90, 90d supply, fill #2

## 2023-11-16 NOTE — Assessment & Plan Note (Signed)
 Currently followed by pulmonology on Trelegy and albuterol  inhaler as needed.  Continue medications

## 2023-11-16 NOTE — Progress Notes (Signed)
 Established Patient Office Visit  Subjective   Patient ID: Mary Hanson, female    DOB: [birth year]  Age: 48 y.o. MRN: [id number]  Chief Complaint  Patient presents with   Establish Care    HPI 8.2 DM2: Patient currently maintained on metformin  500 mg twice daily. States that she is only taking one metformin  a day and had some nausea. She has not been eating appropratly. State s that she has a meter but does not check it at home.   Migraine: Patient currently followed by neurology.  She does have Ubrelvy , Nurtec, Ella triptan for abortive therapy  Hypertension: Currently maintained on amlodipine  10 mg daily, hydrochlorothiazide  25 mg daily. She does not check her BP often. She will check it when she is at work   Asthma: Patient currently maintained on Singulair , Trelegy, albuterol  as.  She is followed by pulmonology. She is not on singular and will take   Tdap: 2021 Flu: Out of season Pneumonia: 2020 Shingles: Too young COVID: original series  HPV: Aged out  Colonoscopy: [facility] Pap smear: 10/12/2021 negative HPV and normal cells Mammogram:12/20/2022 [facility], repeat 1 year DEXA: Too young    Review of Systems  Constitutional:  Negative for chills and fever.  Respiratory:  Negative for shortness of breath.   Cardiovascular:  Negative for chest pain and leg swelling.  Gastrointestinal:  Negative for abdominal pain, blood in stool, constipation, diarrhea, nausea and vomiting.       Bm daily   Genitourinary:  Negative for dysuria and hematuria.  Neurological:  Negative for dizziness, tingling and headaches.  Psychiatric/Behavioral:  Negative for hallucinations and suicidal ideas.       Objective:     BP 134/82   Pulse 70   Temp 98.1 F (36.7 C) (Oral)   Ht 5' 6 (1.676 m)   Wt (!) 305 lb 12.8 oz (138.7 kg)   LMP 11/14/2023 (Exact Date)   SpO2 98%   BMI 49.36 kg/m  BP Readings from Last 3 Encounters:  11/16/23 134/82  08/27/23 133/89  07/12/23 118/74    Wt Readings from Last 3 Encounters:  11/16/23 (!) 305 lb 12.8 oz (138.7 kg)  08/27/23 (!) 308 lb 3.2 oz (139.8 kg)  07/12/23 (!) 311 lb 6.4 oz (141.3 kg)   SpO2 Readings from Last 3 Encounters:  11/16/23 98%  07/12/23 90%  07/10/23 96%      Physical Exam Vitals and nursing note reviewed.  Constitutional:      Appearance: Normal appearance.  HENT:     Right Ear: Tympanic membrane, ear canal and external ear normal.     Left Ear: Tympanic membrane, ear canal and external ear normal.     Mouth/Throat:     Mouth: Mucous membranes are moist.     Pharynx: Oropharynx is clear.  Eyes:     Extraocular Movements: Extraocular movements intact.     Pupils: Pupils are equal, round, and reactive to light.  Cardiovascular:     Rate and Rhythm: Normal rate and regular rhythm.     Heart sounds: Normal heart sounds.  Pulmonary:     Effort: Pulmonary effort is normal.     Breath sounds: Normal breath sounds.  Musculoskeletal:     Right lower leg: No edema.     Left lower leg: No edema.  Lymphadenopathy:     Cervical: No cervical adenopathy.  Neurological:     General: No focal deficit present.     Mental Status: She is alert.  Comments: Bilateral upper and lower extremity strength 5/5    Title   Diabetic Foot Exam - detailed Visual Foot Exam completed.: Yes  Is there a history of foot ulcer?: No Is there a foot ulcer now?: No Is there swelling?: No Is there elevated skin temperature?: No Is there abnormal foot shape?: No Is there a claw toe deformity?: No Are the toenails long?: Yes Are the toenails thick?: No Are the toenails ingrown?: No Pulse Foot Exam completed.: Yes   Right Posterior Tibialis: Diminished Left posterior Tibialis: Diminished   Right Dorsalis Pedis: Present Left Dorsalis Pedis: Present     Sensory Foot Exam Completed.: Yes Semmes-Weinstein Monofilament Test + means has sensation and - means no sensation      Image components are not  supported.   Image components are not supported. Image components are not supported.  Tuning [place]ork Comments All 10 sites tested sensation intact      Results for orders placed or performed in visit on 11/16/23  POCT glycosylated hemoglobin (Hb A1C)  Result Value Ref Range   Hemoglobin A1C 8.2 (A) 4.0 - 5.6 %   HbA1c POC (<> result, manual entry)     HbA1c, POC (prediabetic range)     HbA1c, POC (controlled diabetic range)        The ASCVD Risk score (Arnett DK, et al., 2019) failed to calculate for the following reasons:   Cannot find a previous HDL lab   Cannot find a previous total cholesterol lab    Assessment & Plan:   Problem List Items Addressed This Visit       Cardiovascular and Mediastinum   Essential hypertension   Maintained on amlodipine  10 mg daily, hydrochlorothiazide  25 mg daily.  Blood pressure well-controlled.  Refill provided for both.  Patient has experienced angioedema in the past      Relevant Medications   amLODipine  (NORVASC ) 10 MG tablet   hydrochlorothiazide  (HYDRODIURIL ) 25 MG tablet   Other Relevant Orders   CBC   Comprehensive metabolic panel with GFR   TSH   Migraine headache   Patient currently followed by neurology maintained on Relpax  Compazine , Ubrelvy  currently.      Relevant Medications   amLODipine  (NORVASC ) 10 MG tablet   hydrochlorothiazide  (HYDRODIURIL ) 25 MG tablet     Respiratory   Moderate persistent asthma without complication   Currently followed by pulmonology on Trelegy and albuterol  inhaler as needed.  Continue medications        Endocrine   Type 2 diabetes mellitus without complication, without long-term current use of insulin (HCC) - Primary   Currently on metformin  500 mg daily.  Did experience nausea with 1000 mg daily.  A1c elevated 8.2%.  She will go back to metformin  500 mg twice daily and work on lifestyle modifications if no improvement or adverse drug event did discuss going on a GLP-1 receptor agonist       Relevant Orders   POCT glycosylated hemoglobin (Hb A1C) (Completed)   CBC   Comprehensive metabolic panel with GFR   Microalbumin / creatinine urine ratio     Other   Morbid obesity (HCC)   Pending TSH.  We did discuss the possibility of a GLP-1 receptor agonist in the setting of uncontrolled diabetes.  Patient would like a chance to work on lifestyle modifications currently      Vitamin D  deficiency   History of the same pending vitamin D  level today      Relevant Orders   VITAMIN  D 25 Hydroxy (Vit-D Deficiency, Fractures)   Other Visit Diagnoses       Screening for colon cancer       Relevant Orders   Ambulatory referral to Gastroenterology       Return in about 3 months (around 02/16/2024) for DM recheck, BP recheck.    Adina Crandall, NP

## 2023-11-16 NOTE — Patient Instructions (Signed)
 Nice to see you today I will be in touch with the labs once I have them Follow up with me in 3 months, sooner if you need me

## 2023-11-16 NOTE — Assessment & Plan Note (Signed)
 Maintained on amlodipine  10 mg daily, hydrochlorothiazide  25 mg daily.  Blood pressure well-controlled.  Refill provided for both.  Patient has experienced angioedema in the past

## 2023-11-16 NOTE — Assessment & Plan Note (Signed)
 Patient currently followed by neurology maintained on Relpax  Compazine , Ubrelvy  currently.

## 2023-11-16 NOTE — Assessment & Plan Note (Addendum)
 Pending TSH.  We did discuss the possibility of a GLP-1 receptor agonist in the setting of uncontrolled diabetes.  Patient would like a chance to work on lifestyle modifications currently

## 2023-11-16 NOTE — Assessment & Plan Note (Signed)
 Currently on metformin  500 mg daily.  Did experience nausea with 1000 mg daily.  A1c elevated 8.2%.  She will go back to metformin  500 mg twice daily and work on lifestyle modifications if no improvement or adverse drug event did discuss going on a GLP-1 receptor agonist

## 2023-11-16 NOTE — Assessment & Plan Note (Signed)
History of the same pending vitamin D level today

## 2023-11-19 ENCOUNTER — Other Ambulatory Visit (HOSPITAL_COMMUNITY): Payer: Self-pay

## 2023-11-21 ENCOUNTER — Ambulatory Visit: Payer: Self-pay | Admitting: Nurse Practitioner

## 2023-11-26 ENCOUNTER — Other Ambulatory Visit (HOSPITAL_COMMUNITY): Payer: Self-pay

## 2023-12-03 ENCOUNTER — Telehealth: Payer: Self-pay

## 2023-12-03 MED ORDER — TRELEGY ELLIPTA 200-62.5-25 MCG/ACT IN AEPB: 1.0000 | INHALATION_SPRAY | Freq: Every day | RESPIRATORY_TRACT | Status: AC

## 2023-12-03 NOTE — Telephone Encounter (Signed)
 Copied from CRM [id number]. Topic: Clinical - Medication Question >> Dec 03, 2023 10:38 AM Mary Hanson wrote: Reason for CRM: Patient calling to check if there are any Trelegy samples - her new insurance doesn't start until September 1st.  Callback number: [phone]  Pt is on Trelegy 200 as rx from Dr Neda. I called and spoke to the pt. Pt states she has switched jobs which means she also had to switch insurances. Pt states she is unable to get her RX of Trelegy until her new insurance kicks in and would like samples of the Trelegy until she can get her rx. I informed pt that I would leave 2 samples in our front office for her to pick up at her convenience. Pt verbalized understanding. NFN

## 2023-12-13 ENCOUNTER — Other Ambulatory Visit (HOSPITAL_COMMUNITY): Payer: Self-pay

## 2023-12-13 ENCOUNTER — Telehealth: Payer: Self-pay | Admitting: Pharmacist

## 2023-12-13 NOTE — Telephone Encounter (Signed)
 Pharmacy Patient Advocate Encounter   Received notification from CoverMyMeds that prior authorization for Ubrelvy  100MG  tablets is required/requested.   Insurance verification completed.   The patient is insured through [facility] .   Per test claim: PA required; PA submitted to above mentioned insurance via Latent Key/confirmation #/EOC [id number] Status is pending

## 2023-12-14 ENCOUNTER — Other Ambulatory Visit (HOSPITAL_COMMUNITY): Payer: Self-pay

## 2023-12-14 ENCOUNTER — Encounter: Payer: Self-pay | Admitting: Pharmacist

## 2023-12-14 NOTE — Telephone Encounter (Signed)
 [employer] was unable to locate patient in their system.  I have reached out to the patient to get new insurance information.

## 2023-12-24 ENCOUNTER — Telehealth: Admitting: Physician Assistant

## 2023-12-24 DIAGNOSIS — J069 Acute upper respiratory infection, unspecified: Secondary | ICD-10-CM | POA: Diagnosis not present

## 2023-12-24 MED ORDER — BENZONATATE 100 MG PO CAPS: 100.0000 mg | ORAL_CAPSULE | Freq: Three times a day (TID) | ORAL | 0 refills | Status: AC

## 2023-12-24 MED ORDER — AZITHROMYCIN 250 MG PO TABS: ORAL_TABLET | ORAL | 0 refills | Status: AC

## 2023-12-24 NOTE — Patient Instructions (Signed)
 Mary Hanson, thank you for joining Xavyer Steenson GORMAN Snuffer, PA-C for today's virtual visit.  While this provider is not your primary care provider (PCP), if your PCP is located in our provider database this encounter information will be shared with them immediately following your visit.   A [facility] MyChart account gives you access to today's visit and all your visits, tests, and labs performed at [facility]  click here if you don't have a [facility] MyChart account or go to [email or web address]  Consent: (Patient) Mary Hanson provided verbal consent for this virtual visit at the beginning of the encounter.  Current Medications:  Current Outpatient Medications:    albuterol  (PROVENTIL ) (2.5 MG/3ML) 0.083% nebulizer solution, Take 3 mLs (2.5 mg total) by nebulization every 6 (six) hours as needed for wheezing or shortness of breath., Disp: 75 mL, Rfl: 12   albuterol  (VENTOLIN  HFA) 108 (90 Base) MCG/ACT inhaler, Inhale 2 puffs into the lungs every 4 (four) hours as needed for wheezing or shortness of breath., Disp: 6.7 g, Rfl: 5   amLODipine  (NORVASC ) 10 MG tablet, Take 1 tablet (10 mg total) by mouth daily., Disp: 90 tablet, Rfl: 3   eletriptan  (RELPAX ) 40 MG tablet, Take 1 tablet (40 mg total) by mouth as needed for migraine or headache. May repeat in 2 hours if headache persists or recurs., Disp: 9 tablet, Rfl: 11   fexofenadine  (ALLEGRA  ALLERGY) 180 MG tablet, Take 1 tablet (180 mg total) by mouth daily., Disp: 90 tablet, Rfl: 2   Fluticasone -Umeclidin-Vilant (TRELEGY ELLIPTA ) 200-62.5-25 MCG/ACT AEPB, Inhale 1 puff into the lungs daily., Disp: 60 each, Rfl: 5   Fluticasone -Umeclidin-Vilant (TRELEGY ELLIPTA ) 200-62.5-25 MCG/ACT AEPB, Inhale 1 puff into the lungs daily., Disp: , Rfl:    hydrochlorothiazide  (HYDRODIURIL ) 25 MG tablet, Take 1 tablet (25 mg total) by mouth daily., Disp: 90 tablet, Rfl: 3   ibuprofen  (ADVIL ,MOTRIN ) 200 MG tablet, Take 800 mg by mouth every 6  (six) hours as needed for headache., Disp: , Rfl:    metFORMIN  (GLUCOPHAGE -XR) 500 MG 24 hr tablet, Take 2 tablets by mouth every day as directed, Disp: 60 tablet, Rfl: 2   metFORMIN  (GLUCOPHAGE -XR) 500 MG 24 hr tablet, Take 1 tablet (500 mg total) by mouth 2 (two) times daily., Disp: 60 tablet, Rfl: 0   prochlorperazine  (COMPAZINE ) 10 MG tablet, Take 1 tablet (10 mg total) by mouth every 6 (six) hours as needed for nausea or vomiting., Disp: 30 tablet, Rfl: 11   Rimegepant Sulfate  (NURTEC) 75 MG TBDP, Take 1 tablet (75 mg total) by mouth daily as needed for migraines. Take as close to onset of migraine as possible. Maximum 1 tablet daily. (Patient not taking: Reported on 11/16/2023), Disp: 16 tablet, Rfl: 11   Ubrogepant  (UBRELVY ) 100 MG TABS, Please take 1 (100 mg) tablet onset of Migraine May take another tablet in 2 hours . Don't  exceed 200mg  in a 24 hour period., Disp: 16 tablet, Rfl: 2   Medications ordered in this encounter:  No orders of the defined types were placed in this encounter.    *If you need refills on other medications prior to your next appointment, please contact your pharmacy*  Follow-Up: Call back or seek an in-person evaluation if the symptoms worsen or if the condition fails to improve as anticipated.   Virtual Care [phone]  Other Instructions You were given a prescription for antibiotics. Please take the antibiotic prescription fully.   Take tessalon  as directed   Follow up  with your regular doctor in 1 week for reassessment and seek care sooner if your symptoms worsen or fail to improve.   If you have been instructed to have an in-person evaluation today at a local Urgent Care facility, please use the link below. It will take you to a list of all of our available [facility], including address, phone number and hours of operation. Please do not delay care.  [facility]  If you or a family member do not have a primary  care provider, use the link below to schedule a visit and establish care. When you choose a [facility] primary care physician or advanced practice provider, you gain a long-term partner in health. Find a Primary Care Provider  Learn more about [facility]'s in-office and virtual care options: [facility] - Get Care Now

## 2023-12-24 NOTE — Progress Notes (Signed)
 [name], cortese are scheduled for a virtual visit with your provider today.    Just as we do with appointments in the office, we must obtain your consent to participate.  Your consent will be active for this visit and any virtual visit you may have with one of our providers in the next 365 days.    If you have a MyChart account, I can also send a copy of this consent to you electronically.  All virtual visits are billed to your insurance company just like a traditional visit in the office.  As this is a virtual visit, video technology does not allow for your provider to perform a traditional examination.  This may limit your provider's ability to fully assess your condition.  If your provider identifies any concerns that need to be evaluated in person or the need to arrange testing such as labs, EKG, etc, we will make arrangements to do so.    Although advances in technology are sophisticated, we cannot ensure that it will always work on either your end or our end.  If the connection with a video visit is poor, we may have to switch to a telephone visit.  With either a video or telephone visit, we are not always able to ensure that we have a secure connection.   I need to obtain your verbal consent now.   Are you willing to proceed with your visit today?   Mary Hanson has provided verbal consent on 12/24/2023 for a virtual visit (video or telephone).   Lynden GORMAN Snuffer, PA-C 12/24/2023  10:21 AM   Date:  12/24/2023   ID:  Mary Hanson, DOB Mar 16, 1976, MRN [id number]  Patient Location: Home Provider Location: Home Office   Participants: Patient and Provider for Visit and Wrap up  Method of visit: Video  Location of Patient: Home Location of Provider: Home Office Consent was obtain for visit over the video. Services rendered by provider: Visit was performed via video  A video enabled telemedicine application was used and I verified that I am speaking with the correct person using two  identifiers.  PCP:  No primary care provider on file.   Chief Complaint:  uri  History of Present Illness:    Mary Hanson is a 48 y.o. female with history as stated below. Presents video telehealth for an acute care visit  Pt reports she has had URI sxs for the last week. She reports cough, headache, watery eyes. She reports chills as well but is unsure of fevers.  She has tried mucinex  and otc medications without resolution of symptoms. She had a covid/flu test which was negative.  Past Medical, Surgical, Social History, Allergies, and Medications have been Reviewed.  Past Medical History:  Diagnosis Date   Allergy-induced asthma    prn inhaler   Diabetes mellitus without complication (HCC)    History of anemia    no current problem, per pt.   Hypertension    under control with med., has been on med. x 1 yr.   Migraines    Obesity    Pyogenic granuloma 12/2013   right ring finger   Seasonal allergies     Current Meds  Medication Sig   azithromycin  (ZITHROMAX ) 250 MG tablet Take 2 tablets on day 1, then 1 tablet daily on days 2 through 5   benzonatate  (TESSALON ) 100 MG capsule Take 1 capsule (100 mg total) by mouth every 8 (eight) hours for 5 days.  Allergies:   Mobic [meloxicam], Iodine, Augmentin  [amoxicillin -pot clavulanate], Doxycycline , Lisinopril , and Zofran  [ondansetron  hcl]   ROS See HPI for history of present illness.  Physical Exam Constitutional:      Appearance: Normal appearance. She is not ill-appearing.  Cardiovascular:     Rate and Rhythm: Normal rate.  Pulmonary:     Effort: Pulmonary effort is normal.  Neurological:     Mental Status: She is alert.               MDM: Pt with uri sxs x1 week, no improvement since onset. Suspect possible atypical infection and will cover with azithromycin  and cough medication  Tests Ordered: No orders of the defined types were placed in this encounter.   Medication Changes: Meds ordered this  encounter  Medications   azithromycin  (ZITHROMAX ) 250 MG tablet    Sig: Take 2 tablets on day 1, then 1 tablet daily on days 2 through 5    Dispense:  6 tablet    Refill:  0   benzonatate  (TESSALON ) 100 MG capsule    Sig: Take 1 capsule (100 mg total) by mouth every 8 (eight) hours for 5 days.    Dispense:  15 capsule    Refill:  0     Disposition:  Follow up  Signed, Tagg Eustice GORMAN Snuffer, PA-C  12/24/2023 10:21 AM

## 2023-12-25 ENCOUNTER — Ambulatory Visit (INDEPENDENT_AMBULATORY_CARE_PROVIDER_SITE_OTHER): Admitting: Family

## 2023-12-25 ENCOUNTER — Other Ambulatory Visit (HOSPITAL_COMMUNITY): Payer: Self-pay

## 2023-12-25 ENCOUNTER — Encounter: Payer: Self-pay | Admitting: Family

## 2023-12-25 ENCOUNTER — Telehealth: Payer: Self-pay

## 2023-12-25 VITALS — BP 118/86 | HR 68 | Temp 98.8°F | Ht 66.0 in | Wt 301.2 lb

## 2023-12-25 DIAGNOSIS — J4541 Moderate persistent asthma with (acute) exacerbation: Secondary | ICD-10-CM | POA: Diagnosis not present

## 2023-12-25 DIAGNOSIS — J4540 Moderate persistent asthma, uncomplicated: Secondary | ICD-10-CM | POA: Diagnosis not present

## 2023-12-25 DIAGNOSIS — J301 Allergic rhinitis due to pollen: Secondary | ICD-10-CM

## 2023-12-25 MED ORDER — MONTELUKAST SODIUM 10 MG PO TABS
10.0000 mg | ORAL_TABLET | Freq: Every day | ORAL | 0 refills | Status: DC
  Filled 2023-12-25: qty 90, 90d supply, fill #0

## 2023-12-25 MED ORDER — FLUTICASONE PROPIONATE 50 MCG/ACT NA SUSP
2.0000 | Freq: Every day | NASAL | 6 refills | Status: AC
  Filled 2023-12-25: qty 16, 30d supply, fill #0
  Filled 2024-02-05: qty 16, 30d supply, fill #1

## 2023-12-25 NOTE — Telephone Encounter (Signed)
 SABRA

## 2023-12-25 NOTE — Progress Notes (Signed)
 Established Patient Office Visit  Subjective:      CC:  Chief Complaint  Patient presents with   Acute Visit    Not feeling well. Reports cough congestion, watery eyes. Had a video visit 12/24/23. Home tested for COVID and flu which were negative.    HPI: Mary Hanson is a 48 y.o. female presenting on 12/25/2023 for Acute Visit (Not feeling well. Reports cough congestion, watery eyes. Had a video visit 12/24/23. Home tested for COVID and flu which were negative.) .  Discussed the use of AI scribe software for clinical note transcription with the patient, who gave verbal consent to proceed.  History of Present Illness Mary Hanson is a 48 year old female with diabetes who presents with respiratory symptoms and concerns of pneumonia.  She has been experiencing respiratory symptoms since last Monday, initially attributing them to allergies due to a change in weather. Her symptoms included cough, nasal congestion, chest congestion, headache, and chills. She initially managed these symptoms with over-the-counter medications including Mucinex , Allegra , Tylenol , and Motrin .  Despite initial improvement after three to four days, her symptoms worsened over the weekend. She contacted the on-call doctor on Sunday and had a video visit on Monday, during which she was prescribed azithromycin  (Z-Pak). She took the first dose on Monday morning and felt significantly better by the end of the day. However, she experienced a recurrence of symptoms, including shortness of breath and chills, upon waking up on Tuesday.  She denies any issues with breathing until Tuesday morning and has not checked for fever due to lack of a thermometer. She reports a significant amount of mucus production, which was yellow in the morning, and describes chest tightness and difficulty expectorating mucus. No urinary symptoms, sore throat, or ear pain, but she notes facial pressure and chest tightness.  Her current  medications include Trelegy, which she takes daily, and she has been using two puffs a day of an unspecified inhaler. She also uses Ubrelvy , Cyprinacin, benzonatate , Allegra , Nurtec, Prospirocine, and Relpax  for headaches. She has a history of using Singulair  (montelukast ) years ago but cannot recall its effectiveness.  She has diabetes, with a recent A1c of 8.2, and notes that doxycycline  causes headaches and Augmentin  causes nausea unless taken with food.         Social history:  Relevant past medical, surgical, family and social history reviewed and updated as indicated. Interim medical history since our last visit reviewed.  Allergies and medications reviewed and updated.  DATA REVIEWED: CHART IN EPIC     ROS: Negative unless specifically indicated above in HPI.    Current Outpatient Medications:    albuterol  (PROVENTIL ) (2.5 MG/3ML) 0.083% nebulizer solution, Take 3 mLs (2.5 mg total) by nebulization every 6 (six) hours as needed for wheezing or shortness of breath., Disp: 75 mL, Rfl: 12   albuterol  (VENTOLIN  HFA) 108 (90 Base) MCG/ACT inhaler, Inhale 2 puffs into the lungs every 4 (four) hours as needed for wheezing or shortness of breath., Disp: 6.7 g, Rfl: 5   amLODipine  (NORVASC ) 10 MG tablet, Take 1 tablet (10 mg total) by mouth daily., Disp: 90 tablet, Rfl: 3   azithromycin  (ZITHROMAX ) 250 MG tablet, Take 2 tablets on day 1, then 1 tablet daily on days 2 through 5, Disp: 6 tablet, Rfl: 0   benzonatate  (TESSALON ) 100 MG capsule, Take 1 capsule (100 mg total) by mouth every 8 (eight) hours for 5 days., Disp: 15 capsule, Rfl: 0   eletriptan  (RELPAX )  40 MG tablet, Take 1 tablet (40 mg total) by mouth as needed for migraine or headache. May repeat in 2 hours if headache persists or recurs., Disp: 9 tablet, Rfl: 11   fexofenadine  (ALLEGRA  ALLERGY) 180 MG tablet, Take 1 tablet (180 mg total) by mouth daily., Disp: 90 tablet, Rfl: 2   fluticasone  (FLONASE ) 50 MCG/ACT nasal spray,  Place 2 sprays into both nostrils daily., Disp: 16 g, Rfl: 6   Fluticasone -Umeclidin-Vilant (TRELEGY ELLIPTA ) 200-62.5-25 MCG/ACT AEPB, Inhale 1 puff into the lungs daily., Disp: 60 each, Rfl: 5   Fluticasone -Umeclidin-Vilant (TRELEGY ELLIPTA ) 200-62.5-25 MCG/ACT AEPB, Inhale 1 puff into the lungs daily., Disp: , Rfl:    hydrochlorothiazide  (HYDRODIURIL ) 25 MG tablet, Take 1 tablet (25 mg total) by mouth daily., Disp: 90 tablet, Rfl: 3   ibuprofen  (ADVIL ,MOTRIN ) 200 MG tablet, Take 800 mg by mouth every 6 (six) hours as needed for headache., Disp: , Rfl:    metFORMIN  (GLUCOPHAGE -XR) 500 MG 24 hr tablet, Take 2 tablets by mouth every day as directed, Disp: 60 tablet, Rfl: 2   metFORMIN  (GLUCOPHAGE -XR) 500 MG 24 hr tablet, Take 1 tablet (500 mg total) by mouth 2 (two) times daily., Disp: 60 tablet, Rfl: 0   montelukast  (SINGULAIR ) 10 MG tablet, Take 1 tablet (10 mg total) by mouth at bedtime., Disp: 90 tablet, Rfl: 0   prochlorperazine  (COMPAZINE ) 10 MG tablet, Take 1 tablet (10 mg total) by mouth every 6 (six) hours as needed for nausea or vomiting., Disp: 30 tablet, Rfl: 11   Rimegepant Sulfate  (NURTEC) 75 MG TBDP, Take 1 tablet (75 mg total) by mouth daily as needed for migraines. Take as close to onset of migraine as possible. Maximum 1 tablet daily., Disp: 16 tablet, Rfl: 11   Ubrogepant  (UBRELVY ) 100 MG TABS, Please take 1 (100 mg) tablet onset of Migraine May take another tablet in 2 hours . Don't  exceed 200mg  in a 24 hour period., Disp: 16 tablet, Rfl: 2        Objective:        BP 118/86 (BP Location: Left Arm, Patient Position: Sitting, Cuff Size: Large)   Pulse 68   Temp 98.8 F (37.1 C) (Oral)   Ht 5' 6 (1.676 m)   Wt (!) 301 lb 3.2 oz (136.6 kg)   SpO2 98%   BMI 48.61 kg/m   Physical Exam HEENT: Ear canals red and irritated. Eardrum not infected. Nasal polyps present, swollen with allergies. Tonsils enlarged, considered normal for patient. CHEST: Fleeting wheezes,  resolved with cough.  Wt Readings from Last 3 Encounters:  12/25/23 (!) 301 lb 3.2 oz (136.6 kg)  11/16/23 (!) 305 lb 12.8 oz (138.7 kg)  08/27/23 (!) 308 lb 3.2 oz (139.8 kg)    Physical Exam Vitals reviewed.  Constitutional:      General: She is not in acute distress.    Appearance: Normal appearance. She is normal weight. She is not ill-appearing, toxic-appearing or diaphoretic.  HENT:     Head: Normocephalic.     Right Ear: Tympanic membrane normal.     Left Ear: Tympanic membrane normal.     Nose: Nose normal.     Right Turbinates: Enlarged.     Left Turbinates: Enlarged.     Right Sinus: No maxillary sinus tenderness or frontal sinus tenderness.     Left Sinus: No maxillary sinus tenderness or frontal sinus tenderness.     Mouth/Throat:     Mouth: Mucous membranes are dry.     Pharynx:  No oropharyngeal exudate or posterior oropharyngeal erythema.  Eyes:     Extraocular Movements: Extraocular movements intact.     Pupils: Pupils are equal, round, and reactive to light.  Cardiovascular:     Rate and Rhythm: Normal rate and regular rhythm.     Pulses: Normal pulses.     Heart sounds: Normal heart sounds.  Pulmonary:     Effort: Pulmonary effort is normal.     Breath sounds: Normal breath sounds.  Musculoskeletal:     Cervical back: Normal range of motion.  Neurological:     General: No focal deficit present.     Mental Status: She is alert and oriented to person, place, and time. Mental status is at baseline.  Psychiatric:        Mood and Affect: Mood normal.        Behavior: Behavior normal.        Thought Content: Thought content normal.        Judgment: Judgment normal.          Results LABS   Hemoglobin A1c: 8.2%  Assessment & Plan:   Assessment & Plan Acute upper respiratory infection with cough and chest congestion Symptoms began last Monday with cough, nasal congestion, chest congestion, headache, and chills. Initial improvement with  over-the-counter medications, but symptoms worsened over the weekend. Azithromycin  (Z-Pak) started yesterday with initial improvement, but symptoms returned with shortness of breath and chills. Wheezing noted but fleeting and resolves with cough. No fever checked at home. Yellow sputum noted. Oxygen levels and heart rate are reassuring. Azithromycin  expected to take 48-72 hours for full effect, with coverage lasting a week due to its half-life. - Continue azithromycin  (Z-Pak) as prescribed. - Use Mucinex  to help with mucus. - Drink plenty of water to thin mucus. - Sit with head propped up to aid breathing. - Use Flonase  nasal spray regularly. - If fever or worsening symptoms occur, consider further evaluation.  Moderate persistent asthma Asthma exacerbated by current upper respiratory infection. Trelegy used daily, with additional inhaler use as needed. Wheezing present but resolves with cough. No current need for steroids due to potential impact on blood sugar levels. Montelukast  (Singulair ) prescribed to help with asthma and allergies. - Use inhaler as needed, up to every 4-6 hours if short of breath. - Start montelukast  (Singulair ) at night.  Allergic rhinitis with nasal polyps Chronic condition with nasal polyps exacerbated by current upper respiratory infection. Allegra  helps when taken regularly. Flonase  nasal spray used but not consistently. Montelukast  (Singulair ) may help with allergy symptoms. - Take Allegra  regularly. - Use Flonase  nasal spray regularly. - Start montelukast  (Singulair ) at night.  Type 2 diabetes mellitus Diabetes management ongoing with recent A1c of 8.2. Steroids avoided to prevent elevation of blood sugar levels.  Recording duration: 9 minutes      Return for f/u PCP if no improvement in symptoms.     Ginger Patrick, MSN, APRN, FNP-C [facility]

## 2023-12-25 NOTE — Telephone Encounter (Signed)
 Unable to reach after 3 tries.  Need new insurance information.

## 2023-12-25 NOTE — Telephone Encounter (Signed)
 Saw patient already, thank you for speaking with the patient.  Please see note for further information.

## 2023-12-25 NOTE — Telephone Encounter (Signed)
 Pt already has appt with T Dugal FNP on 12/25/23 at 9 AM. Sending note to T dugal FNP.

## 2023-12-26 ENCOUNTER — Other Ambulatory Visit (HOSPITAL_COMMUNITY): Payer: Self-pay

## 2023-12-26 ENCOUNTER — Other Ambulatory Visit: Payer: Self-pay

## 2023-12-29 ENCOUNTER — Other Ambulatory Visit (HOSPITAL_COMMUNITY): Payer: Self-pay

## 2023-12-29 MED ORDER — PREDNISONE 20 MG PO TABS
40.0000 mg | ORAL_TABLET | Freq: Every day | ORAL | 0 refills | Status: DC
  Filled 2023-12-29: qty 10, 5d supply, fill #0

## 2023-12-29 MED ORDER — AMOXICILLIN-POT CLAVULANATE 875-125 MG PO TABS
1.0000 | ORAL_TABLET | Freq: Two times a day (BID) | ORAL | 0 refills | Status: DC
  Filled 2023-12-29: qty 20, 10d supply, fill #0

## 2023-12-29 MED ORDER — AMOXICILLIN-POT CLAVULANATE 500-125 MG PO TABS
1.0000 | ORAL_TABLET | Freq: Two times a day (BID) | ORAL | 0 refills | Status: DC
  Filled 2023-12-29: qty 14, 7d supply, fill #0

## 2023-12-29 MED ORDER — MUCINEX DM MAXIMUM STRENGTH 60-1200 MG PO TB12
1.0000 | ORAL_TABLET | Freq: Two times a day (BID) | ORAL | 0 refills | Status: DC
  Filled 2023-12-29: qty 14, 7d supply, fill #0

## 2024-01-02 ENCOUNTER — Ambulatory Visit: Payer: Self-pay | Admitting: Nurse Practitioner

## 2024-01-02 NOTE — Telephone Encounter (Signed)
 FYI Only or Action Required?: FYI only for provider.  Patient is followed in Pulmonology for asthma, last seen on 07/12/2023 by Neda Jennet LABOR, MD.  Called Nurse Triage reporting Cough.  Symptoms began several weeks ago.  Interventions attempted: Prescription medications: review chart.  Symptoms are: unchanged.  Triage Disposition: See Physician Within 24 Hours  Patient/caregiver understands and will follow disposition?: Yes                            Copied from CRM [id number]. Topic: Clinical - Red Word Triage >> Jan 02, 2024  2:46 PM Leila BROCKS wrote: Red Word that prompted transfer to Nurse Triage: Patient [phone] states had a cough and cold for 3 weeks, went to pcp prescribed antibiotics was Prednisone . Patient states feels like embedded in chest, shortness of breath for a few days, and asthma is flaring up. Patient is using Trelegy more often and it's not as helpful. Patient toke last day of Prednisone , still on amoxicillin -clavulanate (AUGMENTIN ) 875-125 MG tablet  and MUCINEX  DM MAXIMUM STRENGTH. Patient wants to see Dr. Neda. Please advise. Reason for Disposition  SEVERE coughing spells (e.g., whooping sound after coughing, vomiting after coughing)  Answer Assessment - Initial Assessment Questions Pt was seen on 9/02 at PCP office. Pt has tried taking an antibiotic, flonase , and singulair  but the cough will not get better. Pt is currently taking Augmentin  and prednisone  (started on Saturday). Pt states after 3 weeks she is still not bouncing back. Pt scheduled for an appointment tomorrow in office. This RN educated pt on new-worsening symptoms and when to call back/seek emergent care. Pt verbalized understanding and agrees to plan.   ONSET: When did the cough begin?      3 weeks ago  SEVERITY: How bad is the cough today?      Has to cough 9-10 times to get it up  SPUTUM: Describe the color of your sputum (e.g., none, dry cough; clear,  white, yellow, green)     Initially was tan and after the antibiotic is it clear; very thick  HEMOPTYSIS: Are you coughing up any blood? If Yes, ask: How much? (e.g., flecks, streaks, tablespoons, etc.)     No  DIFFICULTY BREATHING: Are you having difficulty breathing? If Yes, ask: How bad is it? (e.g., mild, moderate, severe)      Some SOB today after walking across campus  FEVER: Do you have a fever? If Yes, ask: What is your temperature, how was it measured, and when did it start?     Chills but can't say she has had a fever  OTHER SYMPTOMS: Do you have any other symptoms? (e.g., runny nose, wheezing, chest pain)       Wheezing  Protocols used: Cough - Acute Productive-A-AH

## 2024-01-02 NOTE — Telephone Encounter (Signed)
 Acute visit scheduled 9/11. Nfn

## 2024-01-03 ENCOUNTER — Ambulatory Visit

## 2024-01-03 ENCOUNTER — Other Ambulatory Visit (HOSPITAL_COMMUNITY): Payer: Self-pay

## 2024-01-03 ENCOUNTER — Ambulatory Visit (INDEPENDENT_AMBULATORY_CARE_PROVIDER_SITE_OTHER): Admitting: Pulmonary Disease

## 2024-01-03 VITALS — BP 134/68 | HR 105 | Temp 98.2°F | Ht 66.0 in | Wt 302.0 lb

## 2024-01-03 DIAGNOSIS — J4551 Severe persistent asthma with (acute) exacerbation: Secondary | ICD-10-CM

## 2024-01-03 MED ORDER — PREDNISONE 20 MG PO TABS
20.0000 mg | ORAL_TABLET | Freq: Every day | ORAL | 0 refills | Status: DC
  Filled 2024-01-03: qty 7, 7d supply, fill #0

## 2024-01-03 MED ORDER — LEVOFLOXACIN 500 MG PO TABS
500.0000 mg | ORAL_TABLET | Freq: Every day | ORAL | 0 refills | Status: DC
  Filled 2024-01-03: qty 7, 7d supply, fill #0

## 2024-01-03 NOTE — Patient Instructions (Addendum)
 Follow-up in about 6 weeks  We will get a chest x-ray today  I will give you a prescription for Levaquin  to have on hand, if Augmentin  does not completely clear your symptoms then you may start the Levaquin  - If you feel your symptoms are getting better then do not start Levaquin   Use Mucinex  instead of Mucinex  DM to help the cough and congestion  Prescription for prednisone  will be sent into pharmacy  May use Tessalon  Perles in addition to Mucinex  to help with the coughing  Call us  with significant concerns

## 2024-01-03 NOTE — Progress Notes (Signed)
 Mary Hanson    [id number]    [birth year]  Primary Care Physician:Cable, Lynwood HERO, NP  Referring Physician: Wendee Lynwood HERO, NP [address] [place],  [place] [address]  Chief complaint:   Follow-up for asthma  HPI:  Asthma exacerbation  Symptoms well-controlled with Trelegy  Cough, congestion, mucus production Treated with a course of azithromycin  that did not help symptoms, subsequently treated with a course of Augmentin  Symptoms are waxing and waning  Asthma symptoms were well-controlled on Trelegy  Tested for COVID and negative  Diagnosed with asthma in her 48s, rarely used inhalers in the past  The last time she was tested, she actually tested negative but most of her symptoms were suggestive of COVID and she was treated for the same  History of hypertension, diabetes  On medications for allergies   Outpatient Encounter Medications as of 01/03/2024  Medication Sig   albuterol  (PROVENTIL ) (2.5 MG/3ML) 0.083% nebulizer solution Take 3 mLs (2.5 mg total) by nebulization every 6 (six) hours as needed for wheezing or shortness of breath.   albuterol  (VENTOLIN  HFA) 108 (90 Base) MCG/ACT inhaler Inhale 2 puffs into the lungs every 4 (four) hours as needed for wheezing or shortness of breath.   amLODipine  (NORVASC ) 10 MG tablet Take 1 tablet (10 mg total) by mouth daily.   amoxicillin -clavulanate (AUGMENTIN ) 500-125 MG tablet Take 1 tablet every 12 hours by oral route for 7 days.   amoxicillin -clavulanate (AUGMENTIN ) 875-125 MG tablet Take 1 tablet every 12 hours by oral route for 10 days.   Dextromethorphan -guaiFENesin  (MUCINEX  DM MAXIMUM STRENGTH) 60-1200 MG TB12 Take 1 tablet twice a day by oral route for 5 days.   eletriptan  (RELPAX ) 40 MG tablet Take 1 tablet (40 mg total) by mouth as needed for migraine or headache. May repeat in 2 hours if headache persists or recurs.   fexofenadine  (ALLEGRA  ALLERGY) 180 MG tablet Take 1 tablet (180 mg total) by mouth  daily.   fluticasone  (FLONASE ) 50 MCG/ACT nasal spray Place 2 sprays into both nostrils daily.   Fluticasone -Umeclidin-Vilant (TRELEGY ELLIPTA ) 200-62.5-25 MCG/ACT AEPB Inhale 1 puff into the lungs daily.   Fluticasone -Umeclidin-Vilant (TRELEGY ELLIPTA ) 200-62.5-25 MCG/ACT AEPB Inhale 1 puff into the lungs daily.   hydrochlorothiazide  (HYDRODIURIL ) 25 MG tablet Take 1 tablet (25 mg total) by mouth daily.   ibuprofen  (ADVIL ,MOTRIN ) 200 MG tablet Take 800 mg by mouth every 6 (six) hours as needed for headache.   metFORMIN  (GLUCOPHAGE -XR) 500 MG 24 hr tablet Take 2 tablets by mouth every day as directed   metFORMIN  (GLUCOPHAGE -XR) 500 MG 24 hr tablet Take 1 tablet (500 mg total) by mouth 2 (two) times daily.   montelukast  (SINGULAIR ) 10 MG tablet Take 1 tablet (10 mg total) by mouth at bedtime.   prochlorperazine  (COMPAZINE ) 10 MG tablet Take 1 tablet (10 mg total) by mouth every 6 (six) hours as needed for nausea or vomiting.   Rimegepant Sulfate  (NURTEC) 75 MG TBDP Take 1 tablet (75 mg total) by mouth daily as needed for migraines. Take as close to onset of migraine as possible. Maximum 1 tablet daily.   Ubrogepant  (UBRELVY ) 100 MG TABS Please take 1 (100 mg) tablet onset of Migraine May take another tablet in 2 hours . Don't  exceed 200mg  in a 24 hour period.   predniSONE  (DELTASONE ) 20 MG tablet Take 2 tablets (40 mg total) by mouth daily for 5 days. (Patient not taking: Reported on 01/03/2024)   No  facility-administered encounter medications on file as of 01/03/2024.    Allergies as of 01/03/2024 - Review Complete 01/03/2024  Allergen Reaction Noted   Mobic [meloxicam] Swelling 05/28/2020   Iodine Nausea And Vomiting 11/19/2014   Augmentin  [amoxicillin -pot clavulanate] Nausea And Vomiting 11/11/2014   Doxycycline  Other (See Comments) 12/13/2021   Lisinopril  Swelling 01/11/2015   Zofran  [ondansetron  hcl] Other (See Comments) 01/16/2014    Past Medical History:  Diagnosis Date    Allergy-induced asthma    prn inhaler   Diabetes mellitus without complication (HCC)    History of anemia    no current problem, per pt.   Hypertension    under control with med., has been on med. x 1 yr.   Migraines    Obesity    Pyogenic granuloma 12/2013   right ring finger   Seasonal allergies     Past Surgical History:  Procedure Laterality Date   BREAST BIOPSY Right 04/24/2018   Rt br bx @  [facility]   CERVICAL CONE BIOPSY  age 29   CHOLECYSTECTOMY  2006 or 2007   DILATION AND CURETTAGE OF UTERUS     DILATION AND EVACUATION  age 30   MASS EXCISION Right 01/16/2014   Procedure: EXCISION PYOGENIC GRANULOMA FROM RIGHT RING FINGER;  Surgeon: Donnice Robinsons, MD;  Location: [facility];  Service: Orthopedics;  Laterality: Right;    Family History  Problem Relation Age of Onset   Hypertension Mother    Diabetes Father    Diabetes type II Father    Hypertension Father    Stroke Father    Goiter Sister 59       identical twin   Asthma Sister    Hypertension Sister    Stroke Maternal Grandfather    Diabetes Paternal Grandfather    Heart disease Paternal Grandfather    Migraines Neg Hx     Social History   Socioeconomic History   Marital status: Single    Spouse name: Not on file   Number of children: 0   Years of education: Ba/Grad [place]   Highest education level: Not on file  Occupational History    Employer: [employer]  Tobacco Use   Smoking status: Never    Passive exposure: Never   Smokeless tobacco: Never  Vaping Use   Vaping status: Never Used  Substance and Sexual Activity   Alcohol use: Not Currently    Comment: Wine once a year    Drug use: No   Sexual activity: Yes    Birth control/protection: Condom, Pill    Comment: quit BCP whils on megace  7/14  Other Topics Concern   Not on file  Social History Narrative   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   Social Drivers of  Health   Financial Resource Strain: Not on file  Food Insecurity: Not on file  Transportation Needs: Not on file  Physical Activity: Not on file  Stress: Not on file  Social Connections: Unknown (09/02/2021)   Received from [facility]   Social Network    Social Network: Not on file  Intimate Partner Violence: Unknown (07/26/2021)   Received from [facility]   HITS    Physically Hurt: Not on file    Insult or Talk Down To: Not on file    Threaten Physical Harm: Not on file    Scream or Curse: Not on file  Review of Systems  Respiratory:  Positive for cough, chest tightness, shortness of breath and wheezing.     Vitals:   01/03/24 1453  BP: 134/68  Pulse: (!) 105  Temp: 98.2 F (36.8 C)  SpO2: 95%     Physical Exam Constitutional:      Appearance: She is obese.  HENT:     Head: Normocephalic.     Mouth/Throat:     Mouth: Mucous membranes are moist.  Eyes:     General: No scleral icterus. Cardiovascular:     Rate and Rhythm: Normal rate and regular rhythm.     Heart sounds: No murmur heard.    No friction rub.  Pulmonary:     Effort: No respiratory distress.     Breath sounds: No stridor. No wheezing or rhonchi.  Musculoskeletal:     Cervical back: No rigidity or tenderness.  Neurological:     Mental Status: She is alert.  Psychiatric:        Mood and Affect: Mood normal.    Data Reviewed: Most recent chest x-ray 12/22/2021-no acute infiltrate  PFT with severe obstructive disease with significant bronchodilator response -Repeat PFT unchanged compared to most recent  Assessment:  Asthma with exacerbation  Class III obesity  History of hypertension, diabetes   Plan/Recommendations:  Continue Trelegy  Will call in a course of Levaquin , only to be used if symptoms persist despite completion of Augmentin   Prescription for prednisone  will be called in  Obtain a chest x-ray  I will see you back in 6 weeks  Call us  with significant  concerns  Jennet Epley MD [facility] 01/03/2024, 2:58 PM  CC: Wendee Lynwood HERO, NP

## 2024-01-04 ENCOUNTER — Other Ambulatory Visit: Payer: Self-pay

## 2024-01-07 ENCOUNTER — Other Ambulatory Visit: Payer: Self-pay

## 2024-01-07 ENCOUNTER — Other Ambulatory Visit: Payer: Self-pay | Admitting: Nurse Practitioner

## 2024-01-08 ENCOUNTER — Other Ambulatory Visit (HOSPITAL_COMMUNITY): Payer: Self-pay

## 2024-01-08 MED ORDER — METFORMIN HCL ER 500 MG PO TB24
500.0000 mg | ORAL_TABLET | Freq: Two times a day (BID) | ORAL | 0 refills | Status: DC
  Filled 2024-01-08: qty 180, 90d supply, fill #0

## 2024-01-09 ENCOUNTER — Other Ambulatory Visit: Payer: Self-pay | Admitting: Family Medicine

## 2024-01-09 ENCOUNTER — Other Ambulatory Visit: Payer: Self-pay

## 2024-01-09 DIAGNOSIS — Z0000 Encounter for general adult medical examination without abnormal findings: Secondary | ICD-10-CM

## 2024-01-12 ENCOUNTER — Other Ambulatory Visit (HOSPITAL_COMMUNITY): Payer: Self-pay

## 2024-01-21 ENCOUNTER — Ambulatory Visit
Admission: RE | Admit: 2024-01-21 | Discharge: 2024-01-21 | Disposition: A | Discharge status: Home / Self Care | Source: Ambulatory Visit | Attending: Family Medicine | Admitting: Family Medicine

## 2024-01-21 DIAGNOSIS — Z0000 Encounter for general adult medical examination without abnormal findings: Secondary | ICD-10-CM

## 2024-01-24 ENCOUNTER — Ambulatory Visit: Payer: Self-pay | Admitting: Family Medicine

## 2024-02-04 ENCOUNTER — Encounter: Payer: Self-pay | Admitting: Nurse Practitioner

## 2024-02-05 ENCOUNTER — Other Ambulatory Visit (HOSPITAL_COMMUNITY): Payer: Self-pay

## 2024-02-05 MED ORDER — FLUZONE 0.5 ML IM SUSY
PREFILLED_SYRINGE | INTRAMUSCULAR | 0 refills | Status: DC
  Filled 2024-02-05: qty 0.5, 1d supply, fill #0

## 2024-02-06 ENCOUNTER — Other Ambulatory Visit (HOSPITAL_COMMUNITY): Payer: Self-pay

## 2024-02-15 ENCOUNTER — Encounter: Payer: Self-pay | Admitting: Nurse Practitioner

## 2024-02-15 ENCOUNTER — Other Ambulatory Visit (HOSPITAL_COMMUNITY): Payer: Self-pay

## 2024-02-15 ENCOUNTER — Telehealth: Payer: Self-pay | Admitting: Nurse Practitioner

## 2024-02-15 ENCOUNTER — Ambulatory Visit: Payer: Self-pay | Admitting: Nurse Practitioner

## 2024-02-15 VITALS — BP 134/82 | HR 86 | Temp 98.4°F | Ht 66.0 in | Wt 300.5 lb

## 2024-02-15 DIAGNOSIS — Z7985 Long-term (current) use of injectable non-insulin antidiabetic drugs: Secondary | ICD-10-CM

## 2024-02-15 DIAGNOSIS — Z7984 Long term (current) use of oral hypoglycemic drugs: Secondary | ICD-10-CM

## 2024-02-15 DIAGNOSIS — E119 Type 2 diabetes mellitus without complications: Secondary | ICD-10-CM

## 2024-02-15 DIAGNOSIS — I10 Essential (primary) hypertension: Secondary | ICD-10-CM | POA: Diagnosis not present

## 2024-02-15 LAB — POCT GLYCOSYLATED HEMOGLOBIN (HGB A1C): Hemoglobin A1C: 8.1 % — AB (ref 4.0–5.6)

## 2024-02-15 MED ORDER — ACCU-CHEK GUIDE ME W/DEVICE KIT
1.0000 | PACK | 0 refills | Status: AC
  Filled 2024-02-15 – 2024-04-29 (×2): qty 1, 30d supply, fill #0

## 2024-02-15 MED ORDER — LANCET DEVICE MISC
1.0000 | 0 refills | Status: AC
  Filled 2024-02-15: qty 1, fill #0

## 2024-02-15 MED ORDER — ACCU-CHEK SOFTCLIX LANCETS MISC
1.0000 | Freq: Every day | 2 refills | Status: AC
  Filled 2024-02-15: qty 100, 100d supply, fill #0
  Filled 2024-04-29: qty 100, 25d supply, fill #0

## 2024-02-15 MED ORDER — OZEMPIC (0.25 OR 0.5 MG/DOSE) 2 MG/3ML ~~LOC~~ SOPN
0.2500 mg | PEN_INJECTOR | SUBCUTANEOUS | 0 refills | Status: DC
  Filled 2024-02-15: qty 3, 56d supply, fill #0

## 2024-02-15 MED ORDER — BLOOD GLUCOSE TEST VI STRP
1.0000 | ORAL_STRIP | Freq: Every day | 2 refills | Status: AC
  Filled 2024-02-15 – 2024-04-29 (×2): qty 100, 100d supply, fill #0

## 2024-02-15 NOTE — Telephone Encounter (Signed)
 Saw Mary Hanson in office want to put her on an ARB. She had angioedema with lisinopril . I know there is a risk for angioedema on ARBs too. Do you know the recommendation in that or cross reactivity  As always, thanks for your help

## 2024-02-15 NOTE — Progress Notes (Signed)
 Established Patient Office Visit  Subjective   Patient ID: Mary Hanson, female    DOB: 08/31/1975  Age: 48 y.o. MRN: [id number]  No chief complaint on file.   HPI  Discussed the use of AI scribe software for clinical note transcription with the patient, who gave verbal consent to proceed.  History of Present Illness Mary Hanson is a 48 year old female with diabetes who presents with concerns about blood sugar control and recent illness.  She has a history of diabetes and is currently taking metformin  1000 mg twice daily. Recently, her blood sugar levels increased significantly, with a reading of 346 mg/dL, which she attributes to a recent course of prednisone . She attempted to increase her metformin  dose to 1500 mg but experienced intolerable gastrointestinal side effects, including diarrhea, and has since returned to her previous dose of 1000 mg, which she tolerates well.  In late August, she developed a severe cold that persisted for four weeks, which she suspects might have been a new strain of COVID-19, although all tests were negative. During this period, she was treated with two courses of prednisone  and antibiotics, including Augmentin , which eventually improved her symptoms. Her blood sugar levels were difficult to control during this time.  She is actively trying to manage her diabetes by cutting out sodas, drinking more water, and increasing her physical activity. She reports a weight loss from 305 pounds in July to 300 pounds currently. She has not yet returned to the gym but is moving more due to her job requirements.  Her family history is significant for diabetes, as her father is diabetic and has experienced complications such as strokes, dialysis, and amputations. She is motivated to avoid similar outcomes.  She is currently on 25 mg of HCTZ and 10 mg of amlodipine  for blood pressure management. She reports variable blood pressure readings, with some elevated  measurements. She has a history of angioedema with lisinopril , which limits her options for blood pressure medications.  She has been checking her blood sugar twice daily but notes that her glucose meter and supplies are outdated.     Review of Systems  Constitutional:  Negative for chills and fever.  Respiratory:  Negative for shortness of breath.   Cardiovascular:  Negative for chest pain.  Gastrointestinal:  Negative for constipation and diarrhea.  Neurological:  Negative for dizziness and headaches.      Objective:     BP 134/82   Pulse 86   Temp 98.4 F (36.9 C) (Oral)   Ht 5' 6 (1.676 m)   Wt (!) 300 lb 8 oz (136.3 kg)   LMP  (LMP Unknown)   SpO2 94%   BMI 48.50 kg/m  BP Readings from Last 3 Encounters:  02/15/24 134/82  01/03/24 134/68  12/25/23 118/86   Wt Readings from Last 3 Encounters:  02/15/24 (!) 300 lb 8 oz (136.3 kg)  01/03/24 (!) 302 lb (137 kg)  12/25/23 (!) 301 lb 3.2 oz (136.6 kg)   SpO2 Readings from Last 3 Encounters:  02/15/24 94%  01/03/24 95%  12/25/23 98%      Physical Exam Vitals and nursing note reviewed.  Constitutional:      Appearance: Normal appearance.  Cardiovascular:     Rate and Rhythm: Normal rate and regular rhythm.     Heart sounds: Normal heart sounds.  Pulmonary:     Effort: Pulmonary effort is normal.     Breath sounds: Normal breath sounds.  Neurological:  Mental Status: She is alert.      Results for orders placed or performed in visit on 02/15/24  POCT HgB A1C  Result Value Ref Range   Hemoglobin A1C 8.1 (A) 4.0 - 5.6 %   HbA1c POC (<> result, manual entry)     HbA1c, POC (prediabetic range)     HbA1c, POC (controlled diabetic range)        The ASCVD Risk score (Arnett DK, et al., 2019) failed to calculate for the following reasons:   Cannot find a previous HDL lab   Cannot find a previous total cholesterol lab    Assessment & Plan:   Problem List Items Addressed This Visit        Cardiovascular and Mediastinum   Essential hypertension     Endocrine   Type 2 diabetes mellitus without complication, without long-term current use of insulin (HCC) - Primary   Relevant Medications   Semaglutide,0.25 or 0.5MG /DOS, (OZEMPIC, 0.25 OR 0.5 MG/DOSE,) 2 MG/3ML SOPN   Blood Glucose Monitoring Suppl (ACCU-CHEK GUIDE ME) w/Device KIT   Glucose Blood (BLOOD GLUCOSE TEST STRIPS) STRP   Lancet Device MISC   Lancets MISC   Other Relevant Orders   POCT HgB A1C (Completed)     Other   Morbid obesity (HCC)   Relevant Medications   Semaglutide,0.25 or 0.5MG /DOS, (OZEMPIC, 0.25 OR 0.5 MG/DOSE,) 2 MG/3ML SOPN   Assessment and Plan Assessment & Plan Type 2 diabetes mellitus Exacerbated by prednisone , A1c 8.1. Experienced GI side effects with metformin . Discussed GLP-1 agonists for weight loss and glucose control. Discussed Ozempic as a treatment option. - Continue metformin  1000 mg daily, divided with meals. - Initiate Ozempic 0.25 mg weekly for 4 weeks, then 0.5 mg weekly. - Prescribe new blood glucose meter and supplies. - Monitor blood glucose daily, preferably in the morning. - Educate on Ozempic side effects: nausea, constipation, stomach pain. - Discuss potential discontinuation of Ozempic and metformin  if goals met.  Morbid obesity Weight is trending down. Increased activity and dietary changes. Discussed weight loss benefits for diabetes and hypertension. - Encourage continued physical activity and dietary modifications. - Monitor weight and encourage further weight loss for diabetes and hypertension management.  Essential hypertension Occasional elevated readings. Current regimen: amlodipine , hydrochlorothiazide . Angioedema with lisinopril  limits ACE inhibitors. Discussed ARB addition pending pharmacist consultation. - Monitor blood pressure at home, aim for <140/90 mmHg. - Consider ARB addition if readings remain elevated, pending pharmacist consultation. - Maintain  amlodipine  10 mg and hydrochlorothiazide  25 mg daily.  Return in about 3 months (around 05/17/2024) for DM recheck.    Adina Crandall, NP

## 2024-02-15 NOTE — Patient Instructions (Signed)
 Nice to see you today We will continue the metformin  at 1000mg  daily I am adding on the ozempic (this is a once a week injection) I want to see you in 3 months, sooner if you need me

## 2024-02-18 ENCOUNTER — Other Ambulatory Visit (HOSPITAL_COMMUNITY): Payer: Self-pay

## 2024-02-22 NOTE — Telephone Encounter (Signed)
 Left detailed voicemail for patient to call the office back.

## 2024-02-22 NOTE — Telephone Encounter (Signed)
 Can we let the patient know that I did speak with the pharmaicist and that the school of thought is split. Some folks are good with going on an ARB after having angioedema with an ACE inhibitor. Since she is not a lay person I think it ok to do the ARB and if she has any recurrence of symptoms to be seen in the Emergency department. Let me know what she thinks of that plan

## 2024-02-26 ENCOUNTER — Other Ambulatory Visit (HOSPITAL_COMMUNITY): Payer: Self-pay

## 2024-02-26 NOTE — Telephone Encounter (Signed)
 Left voicemail for patient to call the office back.

## 2024-02-26 NOTE — Telephone Encounter (Unsigned)
 Copied from CRM [id number]. Topic: General - Other >> Feb 26, 2024  4:54 PM China J wrote: Reason for CRM: The patient is returning a call from the clinic. She wanted to let Lynwood know that she is wanting to stay on amlodipine  for the time being and prefers to talk deeper about this when she comes back for an appointment.

## 2024-02-27 NOTE — Telephone Encounter (Signed)
 noted

## 2024-03-19 ENCOUNTER — Other Ambulatory Visit (HOSPITAL_COMMUNITY): Payer: Self-pay

## 2024-03-28 ENCOUNTER — Other Ambulatory Visit (HOSPITAL_COMMUNITY): Payer: Self-pay

## 2024-04-01 ENCOUNTER — Ambulatory Visit: Admitting: Family Medicine

## 2024-04-01 ENCOUNTER — Other Ambulatory Visit (HOSPITAL_COMMUNITY)
Admission: RE | Admit: 2024-04-01 | Discharge: 2024-04-01 | Disposition: A | Discharge status: Home / Self Care | Source: Ambulatory Visit | Attending: Family Medicine | Admitting: Family Medicine

## 2024-04-01 ENCOUNTER — Encounter: Payer: Self-pay | Admitting: Family Medicine

## 2024-04-01 VITALS — BP 145/88 | HR 99 | Wt 308.1 lb

## 2024-04-01 DIAGNOSIS — N911 Secondary amenorrhea: Secondary | ICD-10-CM

## 2024-04-01 DIAGNOSIS — Z113 Encounter for screening for infections with a predominantly sexual mode of transmission: Secondary | ICD-10-CM | POA: Diagnosis not present

## 2024-04-01 DIAGNOSIS — Z01419 Encounter for gynecological examination (general) (routine) without abnormal findings: Secondary | ICD-10-CM

## 2024-04-01 NOTE — Progress Notes (Signed)
 Subjective:     Mary Hanson is a 48 y.o. female and is here for a comprehensive physical exam. The patient reports problems - LMP was 11/2023.Last time lasted almost 2 weeks.  The following portions of the patient's history were reviewed and updated as appropriate: allergies, current medications, past family history, past medical history, past social history, past surgical history, and problem list.  Review of Systems Pertinent items noted in HPI and remainder of comprehensive ROS otherwise negative.   Objective:    BP (!) 145/88   Pulse 99   Wt (!) 308 lb 2 oz (139.8 kg)   LMP  (LMP Unknown)   BMI 49.73 kg/m  General appearance: alert, cooperative, and appears stated age Head: Normocephalic, without obvious abnormality, atraumatic Neck: no adenopathy, supple, symmetrical, trachea midline, and thyroid  not enlarged, symmetric, no tenderness/mass/nodules Lungs: clear to auscultation bilaterally Heart: regular rate and rhythm, S1, S2 normal, no murmur, click, rub or gallop Abdomen: soft, non-tender; bowel sounds normal; no masses,  no organomegaly Extremities: extremities normal, atraumatic, no cyanosis or edema Pulses: 2+ and symmetric Skin: Skin color, texture, turgor normal. No rashes or lesions Lymph nodes: Cervical, supraclavicular, and axillary nodes normal. Neurologic: Grossly normal       04/01/2024    3:41 PM 02/15/2024    3:47 PM 11/16/2023    9:08 AM  GAD 7 : Generalized Anxiety Score  Nervous, Anxious, on Edge 0 0 0  Control/stop worrying 0 0 0  Worry too much - different things 0 0 0  Trouble relaxing 0 0 0  Restless 0 0 0  Easily annoyed or irritable 0 0 0  Afraid - awful might happen 0 0 0  Total GAD 7 Score 0 0 0  Anxiety Difficulty  Not difficult at all Not difficult at all    [facility] Visit from 04/01/2024 in [facility] for [facility]  PHQ-9 Total Score 0    Assessment:    Healthy female exam.      Plan:   Encounter for gynecological examination without abnormal finding - Annual labs with PCP, has had flu shot. Pap due in 2026. Mammogram is up to date. Still wants to try to have a baby. No current partner. PHQ9 and GAD7 reviewed.  Secondary amenorrhea - Unclear etiology. Check labs to include TSH, FSH, PRL - Plan: Follicle stimulating hormone, TSH, Prolactin  Screen for STD (sexually transmitted disease) - GC/Chlam and blood work done. - Plan: RPR W/RFLX TO RPR TITER, TREPONEMAL AB, SCREEN AND DIAGNOSIS, Hepatitis C antibody, Hepatitis B surface antigen, HIV Antibody (routine testing w rflx), Cervicovaginal ancillary only( [place])    See After Visit Summary for Counseling Recommendations

## 2024-04-02 ENCOUNTER — Ambulatory Visit: Payer: Self-pay | Admitting: Family Medicine

## 2024-04-02 LAB — PROLACTIN: Prolactin: 13.3 ng/mL (ref 4.8–33.4)

## 2024-04-02 LAB — FOLLICLE STIMULATING HORMONE: FSH: 21.6 m[IU]/mL

## 2024-04-02 LAB — SYPHILIS: RPR W/REFLEX TO RPR TITER AND TREPONEMAL ANTIBODIES, TRADITIONAL SCREENING AND DIAGNOSIS ALGORITHM: RPR Ser Ql: NONREACTIVE

## 2024-04-02 LAB — HEPATITIS C ANTIBODY: Hep C Virus Ab: NONREACTIVE

## 2024-04-02 LAB — TSH: TSH: 2.11 u[IU]/mL (ref 0.450–4.500)

## 2024-04-02 LAB — HEPATITIS B SURFACE ANTIGEN: Hepatitis B Surface Ag: NEGATIVE

## 2024-04-02 LAB — HIV ANTIBODY (ROUTINE TESTING W REFLEX): HIV Screen 4th Generation wRfx: NONREACTIVE

## 2024-04-03 LAB — CERVICOVAGINAL ANCILLARY ONLY
Chlamydia: NEGATIVE
Comment: NEGATIVE
Comment: NORMAL
Neisseria Gonorrhea: NEGATIVE

## 2024-04-09 ENCOUNTER — Other Ambulatory Visit (HOSPITAL_COMMUNITY): Payer: Self-pay

## 2024-04-09 MED ORDER — MEDROXYPROGESTERONE ACETATE 10 MG PO TABS
10.0000 mg | ORAL_TABLET | Freq: Every day | ORAL | 5 refills | Status: AC
  Filled 2024-04-09: qty 10, 10d supply, fill #0

## 2024-04-10 ENCOUNTER — Other Ambulatory Visit (HOSPITAL_COMMUNITY): Payer: Self-pay

## 2024-04-29 ENCOUNTER — Other Ambulatory Visit (HOSPITAL_COMMUNITY): Payer: Self-pay

## 2024-04-30 ENCOUNTER — Other Ambulatory Visit: Payer: Self-pay

## 2024-05-13 ENCOUNTER — Other Ambulatory Visit (HOSPITAL_COMMUNITY): Payer: Self-pay

## 2024-05-13 ENCOUNTER — Telehealth: Admitting: Family Medicine

## 2024-05-13 DIAGNOSIS — J019 Acute sinusitis, unspecified: Secondary | ICD-10-CM | POA: Diagnosis not present

## 2024-05-13 DIAGNOSIS — B9689 Other specified bacterial agents as the cause of diseases classified elsewhere: Secondary | ICD-10-CM

## 2024-05-13 MED ORDER — AMOXICILLIN 875 MG PO TABS
875.0000 mg | ORAL_TABLET | Freq: Two times a day (BID) | ORAL | 0 refills | Status: AC
  Filled 2024-05-13: qty 20, 10d supply, fill #0

## 2024-05-13 NOTE — Progress Notes (Signed)
 " Virtual Visit Consent   Mary Hanson, you are scheduled for a virtual visit with a [facility] provider today. Just as with appointments in the office, your consent must be obtained to participate. Your consent will be active for this visit and any virtual visit you may have with one of our providers in the next 365 days. If you have a MyChart account, a copy of this consent can be sent to you electronically.  As this is a virtual visit, video technology does not allow for your provider to perform a traditional examination. This may limit your provider's ability to fully assess your condition. If your provider identifies any concerns that need to be evaluated in person or the need to arrange testing (such as labs, EKG, etc.), we will make arrangements to do so. Although advances in technology are sophisticated, we cannot ensure that it will always work on either your end or our end. If the connection with a video visit is poor, the visit may have to be switched to a telephone visit. With either a video or telephone visit, we are not always able to ensure that we have a secure connection.  By engaging in this virtual visit, you consent to the provision of healthcare and authorize for your insurance to be billed (if applicable) for the services provided during this visit. Depending on your insurance coverage, you may receive a charge related to this service.  I need to obtain your verbal consent now. Are you willing to proceed with your visit today? Mary Hanson has provided verbal consent on 05/13/2024 for a virtual visit (video or telephone). Loa Lamp, FNP  Date: 05/13/2024 5:56 PM   Virtual Visit via Video Note   I, Loa Lamp, connected with  Mary Hanson  ([id number], [birth year]) on 05/13/24 at  6:00 PM EST by a video-enabled telemedicine application and verified that I am speaking with the correct person using two identifiers.  Location: Patient: Virtual Visit Location Patient:  Home Provider: Virtual Visit Location Provider: Home Office   I discussed the limitations of evaluation and management by telemedicine and the availability of in person appointments. The patient expressed understanding and agreed to proceed.    History of Present Illness: Mary Hanson is a 49 y.o. who identifies as a female who was assigned female at birth, and is being seen today for sinus pressure and pain, sx for over a week with weather change, mucinex  and otc cold and flu not working, on trelegy, post nasal drainage. Using flonase . No fever. Yellow mucus. No cough wheezing or sob. Hoarse from drainage.   HPI: HPI  Problems:  Patient Active Problem List   Diagnosis Date Noted   Type 2 diabetes mellitus without complication, without long-term current use of insulin (HCC) 11/28/2018   Moderate persistent asthma without complication 05/16/2018   Solitary pulmonary nodule 02/10/2016   Menorrhagia 05/31/2015   Peripheral neuralgia 11/11/2014   Enlarged thyroid  04/03/2013   Vitamin D  deficiency 07/29/2012   External hemorrhoid 09/06/2011   Essential hypertension 12/29/2010   Morbid obesity (HCC) 12/29/2010   Allergic rhinitis 12/29/2010   Migraine headache 12/29/2010    Allergies: Allergies[1] Medications: Current Medications[2]  Observations/Objective: Patient is well-developed, well-nourished in no acute distress.  Resting comfortably  at home.  Head is normocephalic, atraumatic.  No labored breathing.  Speech is clear and coherent with logical content.  Patient is alert and oriented at baseline.    Assessment and Plan: 1. Acute bacterial sinusitis (Primary)  Increase fluids, humidifier at night, tylenol  or ibuprofen , UC as needed.  Follow Up Instructions: I discussed the assessment and treatment plan with the patient. The patient was provided an opportunity to ask questions and all were answered. The patient agreed with the plan and demonstrated an understanding of the  instructions.  A copy of instructions were sent to the patient via MyChart unless otherwise noted below.     The patient was advised to call back or seek an in-person evaluation if the symptoms worsen or if the condition fails to improve as anticipated.    Loa Lamp, FNP     [1]  Allergies Allergen Reactions   Mobic [Meloxicam] Swelling    Swelling of arms and legs, tightness in chest   Iodine Nausea And Vomiting   Augmentin  [Amoxicillin -Pot Clavulanate] Nausea And Vomiting    Pt states was nauseated due to not eating enough   Doxycycline  Other (See Comments)    Headache   Lisinopril  Swelling    Lip swelling/Angioedema   Zofran  [Ondansetron  Hcl] Other (See Comments)    Severe headache  [2]  Current Outpatient Medications:    albuterol  (PROVENTIL ) (2.5 MG/3ML) 0.083% nebulizer solution, Take 3 mLs (2.5 mg total) by nebulization every 6 (six) hours as needed for wheezing or shortness of breath., Disp: 75 mL, Rfl: 12   albuterol  (VENTOLIN  HFA) 108 (90 Base) MCG/ACT inhaler, Inhale 2 puffs into the lungs every 4 (four) hours as needed for wheezing or shortness of breath., Disp: 6.7 g, Rfl: 5   amLODipine  (NORVASC ) 10 MG tablet, Take 1 tablet (10 mg total) by mouth daily., Disp: 90 tablet, Rfl: 3   Blood Glucose Monitoring Suppl (ACCU-CHEK GUIDE ME) w/Device KIT, Use as directed up to 4 times daily, Disp: 1 kit, Rfl: 0   eletriptan  (RELPAX ) 40 MG tablet, Take 1 tablet (40 mg total) by mouth as needed for migraine or headache. May repeat in 2 hours if headache persists or recurs., Disp: 9 tablet, Rfl: 11   fexofenadine  (ALLEGRA  ALLERGY) 180 MG tablet, Take 1 tablet (180 mg total) by mouth daily., Disp: 90 tablet, Rfl: 2   fluticasone  (FLONASE ) 50 MCG/ACT nasal spray, Place 2 sprays into both nostrils daily., Disp: 16 g, Rfl: 6   Fluticasone -Umeclidin-Vilant (TRELEGY ELLIPTA ) 200-62.5-25 MCG/ACT AEPB, Inhale 1 puff into the lungs daily., Disp: 60 each, Rfl: 5    Fluticasone -Umeclidin-Vilant (TRELEGY ELLIPTA ) 200-62.5-25 MCG/ACT AEPB, Inhale 1 puff into the lungs daily., Disp: , Rfl:    Glucose Blood (BLOOD GLUCOSE TEST STRIPS) STRP, Use up to four times daily as directed. (FOR ICD-10 E10.9, E11.9)., Disp: 100 strip, Rfl: 2   hydrochlorothiazide  (HYDRODIURIL ) 25 MG tablet, Take 1 tablet (25 mg total) by mouth daily., Disp: 90 tablet, Rfl: 3   ibuprofen  (ADVIL ,MOTRIN ) 200 MG tablet, Take 800 mg by mouth every 6 (six) hours as needed for headache., Disp: , Rfl:    Lancet Device MISC, 1 each by Does not apply route as directed. Dispense based on patient and insurance preference. Use up to four times daily as directed. (FOR ICD-10 E10.9, E11.9)., Disp: 1 each, Rfl: 0   Accu-Chek Softclix Lancets lancets, Use 4 times daily as directed, Disp: 100 each, Rfl: 2   medroxyPROGESTERone  (PROVERA ) 10 MG tablet, Take 1 tablet (10 mg total) by mouth daily., Disp: 10 tablet, Rfl: 5   metFORMIN  (GLUCOPHAGE -XR) 500 MG 24 hr tablet, Take 1 tablet (500 mg total) by mouth 2 (two) times daily., Disp: 180 tablet, Rfl: 0   montelukast  (SINGULAIR )  10 MG tablet, Take 1 tablet (10 mg total) by mouth at bedtime. (Patient not taking: Reported on 04/01/2024), Disp: 90 tablet, Rfl: 0   prochlorperazine  (COMPAZINE ) 10 MG tablet, Take 1 tablet (10 mg total) by mouth every 6 (six) hours as needed for nausea or vomiting., Disp: 30 tablet, Rfl: 11   Rimegepant Sulfate  (NURTEC) 75 MG TBDP, Take 1 tablet (75 mg total) by mouth daily as needed for migraines. Take as close to onset of migraine as possible. Maximum 1 tablet daily. (Patient not taking: Reported on 04/01/2024), Disp: 16 tablet, Rfl: 11   Semaglutide ,0.25 or 0.5MG /DOS, (OZEMPIC , 0.25 OR 0.5 MG/DOSE,) 2 MG/3ML SOPN, Inject 0.25 mg into the skin once a week. (Patient not taking: Reported on 04/01/2024), Disp: 3 mL, Rfl: 0   Ubrogepant  (UBRELVY ) 100 MG TABS, Please take 1 (100 mg) tablet onset of Migraine May take another tablet in 2 hours .  Don't  exceed 200mg  in a 24 hour period., Disp: 16 tablet, Rfl: 2  "

## 2024-05-13 NOTE — Patient Instructions (Signed)

## 2024-05-26 ENCOUNTER — Other Ambulatory Visit: Payer: Self-pay | Admitting: Nurse Practitioner

## 2024-05-26 ENCOUNTER — Other Ambulatory Visit (HOSPITAL_COMMUNITY): Payer: Self-pay

## 2024-05-26 ENCOUNTER — Other Ambulatory Visit: Payer: Self-pay

## 2024-05-27 ENCOUNTER — Other Ambulatory Visit (HOSPITAL_COMMUNITY): Payer: Self-pay

## 2024-05-27 MED ORDER — METFORMIN HCL ER 500 MG PO TB24
500.0000 mg | ORAL_TABLET | Freq: Two times a day (BID) | ORAL | 0 refills | Status: DC
  Filled 2024-05-27: qty 180, 90d supply, fill #0

## 2024-05-28 ENCOUNTER — Other Ambulatory Visit: Payer: Self-pay

## 2024-05-30 ENCOUNTER — Other Ambulatory Visit (HOSPITAL_COMMUNITY): Payer: Self-pay

## 2024-05-30 ENCOUNTER — Ambulatory Visit: Admitting: Nurse Practitioner

## 2024-05-30 VITALS — BP 128/82 | HR 79 | Temp 98.0°F | Ht 66.0 in | Wt 307.0 lb

## 2024-05-30 DIAGNOSIS — E119 Type 2 diabetes mellitus without complications: Secondary | ICD-10-CM

## 2024-05-30 DIAGNOSIS — Z1211 Encounter for screening for malignant neoplasm of colon: Secondary | ICD-10-CM

## 2024-05-30 LAB — POCT GLYCOSYLATED HEMOGLOBIN (HGB A1C): Hemoglobin A1C: 7.6 % — AB (ref 4.0–5.6)

## 2024-05-30 MED ORDER — RYBELSUS 3 MG PO TABS
3.0000 mg | ORAL_TABLET | Freq: Every day | ORAL | 0 refills | Status: AC
  Filled 2024-05-30: qty 30, 30d supply, fill #0

## 2024-05-30 MED ORDER — METFORMIN HCL ER 500 MG PO TB24
500.0000 mg | ORAL_TABLET | Freq: Two times a day (BID) | ORAL | 2 refills | Status: AC
  Filled 2024-05-30: qty 180, 90d supply, fill #0

## 2024-05-30 NOTE — Patient Instructions (Signed)
 Nice to see you today We will continue the metformin  and I have started the Rybellus Follow up with me in 3 months, sooner if you need me  I have placed a referral for GI to get you scheduled for a colonoscopy

## 2024-05-30 NOTE — Progress Notes (Signed)
 "  Established Patient Office Visit  Subjective   Patient ID: Mary Hanson, female    DOB: 10-09-1975  Age: 49 y.o. MRN: [id number]  Chief Complaint  Patient presents with   Diabetes   Medication Management    Ozempic . States of being afraid of needles and would like to discuss the injectable. Complains that she has not started yet.     Discussed the use of AI scribe software for clinical note transcription with the patient, who gave verbal consent to proceed.  History of Present Illness Mary Hanson is a 49 year old female with diabetes who presents for follow-up of her diabetes management.  She has a history of diabetes with a recent A1c of 8.1. Her A1c has since decreased to 7.6. She reports that she got braces in January and has had difficulty eating due to sore teeth.  She is currently taking metformin  500 mg twice daily, with breakfast and dinner to minimize gastrointestinal side effects. She has previously attempted higher doses of metformin  but experienced significant gastrointestinal issues.  Her physical activity has been limited, and she acknowledges not being as active as she should be. She is considering returning to the gym and has been looking for a suitable facility.  In terms of diet, she has been drinking more water due to dry mouth from her braces and has switched to diet soda from regular soda. Occasionally, she consumes a small Izzy drink, which she notes contains 15-20 grams of carbohydrates.  She reports regular bowel movements, occurring daily.  She also will check her sugar twice a day at home.     Review of Systems  Constitutional:  Negative for chills and fever.  Respiratory:  Negative for shortness of breath.   Cardiovascular:  Negative for chest pain.  Gastrointestinal:  Negative for constipation and diarrhea.      Objective:     BP 128/82   Pulse 79   Temp 98 F (36.7 C) (Oral)   Ht 5' 6 (1.676 m)   Wt (!) 307 lb (139.3 kg)   SpO2  98%   BMI 49.55 kg/m  BP Readings from Last 3 Encounters:  05/30/24 128/82  04/01/24 (!) 145/88  02/15/24 134/82   Wt Readings from Last 3 Encounters:  05/30/24 (!) 307 lb (139.3 kg)  04/01/24 (!) 308 lb 2 oz (139.8 kg)  02/15/24 (!) 300 lb 8 oz (136.3 kg)   SpO2 Readings from Last 3 Encounters:  05/30/24 98%  02/15/24 94%  01/03/24 95%      Physical Exam Vitals and nursing note reviewed.  Constitutional:      Appearance: Normal appearance.  Cardiovascular:     Rate and Rhythm: Normal rate and regular rhythm.     Heart sounds: Normal heart sounds.  Pulmonary:     Effort: Pulmonary effort is normal.     Breath sounds: Normal breath sounds.  Abdominal:     General: Bowel sounds are normal.  Neurological:     Mental Status: She is alert.      Results for orders placed or performed in visit on 05/30/24  POCT glycosylated hemoglobin (Hb A1C)  Result Value Ref Range   Hemoglobin A1C 7.6 (A) 4.0 - 5.6 %   HbA1c POC (<> result, manual entry)     HbA1c, POC (prediabetic range)     HbA1c, POC (controlled diabetic range)        The ASCVD Risk score (Arnett DK, et al., 2019) failed to calculate for  the following reasons:   Cannot find a previous HDL lab   Cannot find a previous total cholesterol lab   * - Cholesterol units were assumed    Assessment & Plan:   Problem List Items Addressed This Visit       Endocrine   Type 2 diabetes mellitus without complication, without long-term current use of insulin (HCC) - Primary   Relevant Medications   metFORMIN  (GLUCOPHAGE -XR) 500 MG 24 hr tablet   Semaglutide  (RYBELSUS ) 3 MG TABS   Other Relevant Orders   POCT glycosylated hemoglobin (Hb A1C) (Completed)   Other Visit Diagnoses       Screening for colon cancer       Relevant Orders   Ambulatory referral to Gastroenterology      Assessment and Plan Assessment & Plan Type 2 diabetes mellitus A1c improved to 7.6% but above target. Concerns about Ozempic  side  effects and injection difficulty. Rybelsus  considered as alternative. Metformin  continued with meal timing to manage GI side effects. Goal A1c <6.5% due to young age. - Canceled Ozempic  prescription. - Prescribed Rybelsus  3 mg once daily, to be taken first thing in the morning with 4 ounces of water, no other food, fluids, or medications for 30 minutes, and remain upright. - Continue Metformin  500 mg bid, taken with meals to minimize GI side effects. - Encouraged physical activity, starting with 10-15 minutes, 2-3 times a week, with a goal of 30 minutes, 5 times a week. - Discussed dietary modifications, including limiting sugar and carb intake.  Morbid obesity Weight stable at 300 lbs. Physical activity limited due to recent dental braces and dietary changes. Discussed exercise benefits for weight and diabetes control. - Encouraged gradual increase in physical activity, starting with body weight exercises and walking. - Discussed potential for gym membership and accountability partner for exercise. - Advised on sustainable exercise habits to avoid burnout.    Return in about 3 months (around 08/27/2024) for DM recheck.    Adina Crandall, NP  "

## 2024-08-29 ENCOUNTER — Ambulatory Visit: Admitting: Nurse Practitioner

## 2024-09-01 ENCOUNTER — Telehealth: Admitting: Neurology

## 2024-09-02 ENCOUNTER — Ambulatory Visit: Admitting: Neurology
